# Patient Record
Sex: Male | Born: 1950 | Race: Black or African American | Hispanic: Refuse to answer | Marital: Single | Smoking: Former smoker
Health system: Southern US, Community
[De-identification: ages and names within clinical notes are randomized; demographics above are authoritative.]

## PROBLEM LIST (undated history)

## (undated) DIAGNOSIS — G8929 Other chronic pain: Secondary | ICD-10-CM

## (undated) DIAGNOSIS — E669 Obesity, unspecified: Secondary | ICD-10-CM

## (undated) DIAGNOSIS — I5042 Chronic combined systolic (congestive) and diastolic (congestive) heart failure: Secondary | ICD-10-CM

## (undated) DIAGNOSIS — J449 Chronic obstructive pulmonary disease, unspecified: Secondary | ICD-10-CM

## (undated) DIAGNOSIS — I429 Cardiomyopathy, unspecified: Secondary | ICD-10-CM

## (undated) DIAGNOSIS — G4730 Sleep apnea, unspecified: Secondary | ICD-10-CM

## (undated) DIAGNOSIS — K219 Gastro-esophageal reflux disease without esophagitis: Secondary | ICD-10-CM

## (undated) DIAGNOSIS — M1990 Unspecified osteoarthritis, unspecified site: Secondary | ICD-10-CM

## (undated) DIAGNOSIS — I10 Essential (primary) hypertension: Secondary | ICD-10-CM

## (undated) DIAGNOSIS — M109 Gout, unspecified: Secondary | ICD-10-CM

## (undated) DIAGNOSIS — M549 Dorsalgia, unspecified: Secondary | ICD-10-CM

## (undated) HISTORY — DX: Cardiomyopathy, unspecified: I42.9

## (undated) HISTORY — DX: Obesity, unspecified: E66.9

## (undated) HISTORY — DX: Chronic combined systolic (congestive) and diastolic (congestive) heart failure: I50.42

## (undated) HISTORY — PX: TONSILLECTOMY: SUR1361

## (undated) HISTORY — DX: Gout, unspecified: M10.9

## (undated) HISTORY — PX: EPIDURAL BLOCK INJECTION: SHX1516

---

## 2016-08-31 ENCOUNTER — Other Ambulatory Visit: Payer: Self-pay | Admitting: Internal Medicine

## 2016-08-31 ENCOUNTER — Other Ambulatory Visit: Payer: Self-pay

## 2016-08-31 ENCOUNTER — Ambulatory Visit (INDEPENDENT_AMBULATORY_CARE_PROVIDER_SITE_OTHER): Payer: Medicaid Other

## 2016-08-31 DIAGNOSIS — I519 Heart disease, unspecified: Secondary | ICD-10-CM | POA: Diagnosis not present

## 2016-09-01 NOTE — Progress Notes (Unsigned)
Ronald Horne

## 2017-01-30 ENCOUNTER — Telehealth: Payer: Self-pay | Admitting: *Deleted

## 2017-06-23 ENCOUNTER — Emergency Department
Admission: EM | Admit: 2017-06-23 | Discharge: 2017-06-23 | Disposition: A | Discharge status: Home / Self Care | Payer: Medicaid Other | Attending: Emergency Medicine | Admitting: Emergency Medicine

## 2017-06-23 ENCOUNTER — Encounter: Payer: Self-pay | Admitting: Emergency Medicine

## 2017-06-23 DIAGNOSIS — H6122 Impacted cerumen, left ear: Secondary | ICD-10-CM | POA: Insufficient documentation

## 2017-06-23 DIAGNOSIS — Z79891 Long term (current) use of opiate analgesic: Secondary | ICD-10-CM | POA: Insufficient documentation

## 2017-06-23 DIAGNOSIS — Z79899 Other long term (current) drug therapy: Secondary | ICD-10-CM | POA: Diagnosis not present

## 2017-06-23 DIAGNOSIS — H9202 Otalgia, left ear: Secondary | ICD-10-CM | POA: Diagnosis present

## 2017-06-23 DIAGNOSIS — G8929 Other chronic pain: Secondary | ICD-10-CM | POA: Diagnosis not present

## 2017-06-23 DIAGNOSIS — I10 Essential (primary) hypertension: Secondary | ICD-10-CM | POA: Diagnosis not present

## 2017-06-23 HISTORY — DX: Dorsalgia, unspecified: M54.9

## 2017-06-23 HISTORY — DX: Gastro-esophageal reflux disease without esophagitis: K21.9

## 2017-06-23 HISTORY — DX: Unspecified osteoarthritis, unspecified site: M19.90

## 2017-06-23 HISTORY — DX: Essential (primary) hypertension: I10

## 2017-06-23 HISTORY — DX: Other chronic pain: G89.29

## 2017-06-23 MED ORDER — CARBAMIDE PEROXIDE 6.5 % OT SOLN: 5.0000 [drp] | Freq: Two times a day (BID) | OTIC | 0 refills | Status: DC

## 2017-06-23 NOTE — ED Notes (Signed)
See triage note  States he thinks he had gotten some water in left ear about 2 days ago.. States he had decreased hearing in left ear this afternoon  States hearing has improved slightly

## 2017-06-23 NOTE — ED Provider Notes (Signed)
[facility] Emergency Department Provider Note   ____________________________________________   First MD Initiated Contact with Patient 06/23/17 1759     (approximate)  I have reviewed the triage vital signs and the nursing notes.   HISTORY  Chief Complaint Otalgia and Hearing Loss    HPI Ronald Horne is a 67 y.o. male patient complaining of decreased hearing left ear for 2 days. Patient states status post taken a shower. Patient has a history of cerumen impaction.No palliative measures for complaint. Patient rates his pain discomfort as a 4/10.   Past Medical History:  Diagnosis Date  . Arthritis   . Chronic back pain   . GERD (gastroesophageal reflux disease)   . Hypertension     There are no active problems to display for this patient.   History reviewed. No pertinent surgical history.  Prior to Admission medications   Medication Sig Start Date End Date Taking? Authorizing Provider  atorvastatin (LIPITOR) 10 MG tablet Take 10 mg by mouth daily.   Yes [provider]  lisinopril (PRINIVIL,ZESTRIL) 40 MG tablet Take 40 mg by mouth daily.   Yes [provider]  methimazole (TAPAZOLE) 10 MG tablet Take 10 mg by mouth 3 (three) times daily.   Yes [provider]  metoprolol tartrate (LOPRESSOR) 25 MG tablet Take 25 mg by mouth 2 (two) times daily.   Yes [provider]  mirtazapine (REMERON) 30 MG tablet Take 30 mg by mouth at bedtime.   Yes [provider]  morphine (MS CONTIN) 60 MG 12 hr tablet Take 60 mg by mouth every 12 (twelve) hours.   Yes [provider]  carbamide peroxide (DEBROX) 6.5 % OTIC solution Place 5 drops into the right ear 2 (two) times daily. 06/23/17   Joni Reining, PA-C    Allergies Patient has no known allergies.  No family history on file.  Social History Social History  Substance Use Topics  . Smoking status: Never Smoker  . Smokeless tobacco: Never Used   . Alcohol use No    Review of Systems  Constitutional: No fever/chills Eyes: No visual changes. ENT: No sore throat. Decreased hearing left ear Cardiovascular: Denies chest pain. Respiratory: Denies shortness of breath. Gastrointestinal: No abdominal pain.  No nausea, no vomiting.  No diarrhea.  No constipation. Genitourinary: Negative for dysuria. Musculoskeletal: Chronic back pain Skin: Negative for rash. Neurological: Negative for headaches, focal weakness or numbness. Endocrine:Hypertension   ____________________________________________   PHYSICAL EXAM:  VITAL SIGNS: ED Triage Vitals  Enc Vitals Group     BP 06/23/17 1740 (!) 157/98     Pulse Rate 06/23/17 1740 98     Resp 06/23/17 1740 18     Temp 06/23/17 1740 99.1 F (37.3 C)     Temp Source 06/23/17 1740 Oral     SpO2 06/23/17 1740 96 %     Weight 06/23/17 1736 (!) 310 lb (140.6 kg)     Height 06/23/17 1736 6\' 1"  (1.854 m)     Head Circumference --      Peak Flow --      Pain Score 06/23/17 1736 4     Pain Loc --      Pain Edu? --      Excl. in GC? --     Constitutional: Alert and oriented. Well appearing and in no acute distress. EAR: Left TM not visible secondary to impaction Cardiovascular: Normal rate, regular rhythm. Grossly normal heart sounds.  Good peripheral circulation. Respiratory:  Normal respiratory effort.  No retractions. Lungs CTAB. Neurologic:  Normal speech and language. No gross focal neurologic deficits are appreciated. No gait instability. Skin:  Skin is warm, dry and intact. No rash noted. Psychiatric: Mood and affect are normal. Speech and behavior are normal.  ____________________________________________   LABS (all labs ordered are listed, but only abnormal results are displayed)  Labs Reviewed - No data to display ____________________________________________  EKG   ____________________________________________  RADIOLOGY  No results  found.  ____________________________________________   PROCEDURES  Procedure(s) performed: None  Procedures  Critical Care performed: No  ____________________________________________   INITIAL IMPRESSION / ASSESSMENT AND PLAN / ED COURSE  Pertinent labs & imaging results that were available during my care of the patient were reviewed by me and considered in my medical decision making (see chart for details).  Patient was sent for 2-3 days a hearing loss secondary to cerumen impaction. Nurses attempted to irrigate ear but noticed bloody return. Patient informed errors that he use a bobby pin 2 days ago to try removal earwax and has some mild bleeding. Advised patient we will stop irrigation and use it wax softener for 2-3 days. Advised to follow-up with PCP or return to ED in 3 days.      ____________________________________________   FINAL CLINICAL IMPRESSION(S) / ED DIAGNOSES  Final diagnoses:  Impacted cerumen of left ear      NEW MEDICATIONS STARTED DURING THIS VISIT:  New Prescriptions   CARBAMIDE PEROXIDE (DEBROX) 6.5 % OTIC SOLUTION    Place 5 drops into the right ear 2 (two) times daily.     Note:  This document was prepared using Dragon voice recognition software and may include unintentional dictation errors.    Joni Reining, PA-C 06/23/17 1833    Emily Filbert, MD 06/23/17 [phone]

## 2017-06-23 NOTE — ED Notes (Signed)
Left ear irrigated with warm water  Small amt of ear wax removed  With some blood noted in ear canal  States he stuck a bobby pin in same ear PTA

## 2017-06-23 NOTE — ED Triage Notes (Signed)
Patient presents to the ED with left ear pain and difficulty hearing out of his left ear x 2 days.  Patient is in no obvious distress at this time.

## 2017-06-26 ENCOUNTER — Emergency Department
Admission: EM | Admit: 2017-06-26 | Discharge: 2017-06-26 | Disposition: A | Discharge status: Home / Self Care | Payer: Medicaid Other | Attending: Emergency Medicine | Admitting: Emergency Medicine

## 2017-06-26 DIAGNOSIS — Z79899 Other long term (current) drug therapy: Secondary | ICD-10-CM | POA: Insufficient documentation

## 2017-06-26 DIAGNOSIS — H6122 Impacted cerumen, left ear: Secondary | ICD-10-CM

## 2017-06-26 DIAGNOSIS — H9222 Otorrhagia, left ear: Secondary | ICD-10-CM | POA: Diagnosis not present

## 2017-06-26 DIAGNOSIS — I10 Essential (primary) hypertension: Secondary | ICD-10-CM | POA: Diagnosis not present

## 2017-06-26 MED ORDER — CIPROFLOXACIN-DEXAMETHASONE 0.3-0.1 % OT SUSP: 4.0000 [drp] | Freq: Two times a day (BID) | OTIC | 0 refills | Status: AC

## 2017-06-26 NOTE — ED Notes (Signed)
States he wants to speak with Provider again and requesting antibiotics for ear   Provider in with pt

## 2017-06-26 NOTE — ED Notes (Signed)
See triage note  Was seen on Friday for same  Was told to return on Monday for irrigation

## 2017-06-26 NOTE — ED Notes (Signed)
Left ear was irrigated with warm water/h2o2  Removed 4 small pieces wax then noticed blood in ear canal  Provider made aware

## 2017-06-26 NOTE — ED Triage Notes (Signed)
Pt states he was here the other night and had impaction of the left ear, states they started to clean the ear out but stopped and he is here to finish it.

## 2017-06-26 NOTE — Discharge Instructions (Signed)
Follow-up with ENT regarding continued care of cerumen in the left ear.

## 2017-06-26 NOTE — ED Provider Notes (Signed)
[facility] Emergency Department Provider Note   ____________________________________________   I have reviewed the triage vital signs and the nursing notes.   HISTORY  Chief Complaint Cerumen Impaction    HPI Ronald Horne is a 66 y.o. male presents to the emergency department with left ear cerumen buildup. Patient was seen on Friday for the same. Patient was informed to return to have remaining cerumen removed. Patient reports he was unable to hear at all prior to Fridays treatment, following that treatment he has been able to hear. Patient denies ear pain, swelling or other ear symptoms. Patient reports using the debrox drops as prescribed. Patient denies fever, chills, headache, vision changes, chest pain, chest tightness, shortness of breath, abdominal pain, nausea and vomiting.  Past Medical History:  Diagnosis Date  . Arthritis   . Chronic back pain   . GERD (gastroesophageal reflux disease)   . Hypertension     There are no active problems to display for this patient.   History reviewed. No pertinent surgical history.  Prior to Admission medications   Medication Sig Start Date End Date Taking? Authorizing Provider  atorvastatin (LIPITOR) 10 MG tablet Take 10 mg by mouth daily.    [provider]  carbamide peroxide (DEBROX) 6.5 % OTIC solution Place 5 drops into the right ear 2 (two) times daily. 06/23/17   Joni Reining, PA-C  ciprofloxacin-dexamethasone (CIPRODEX) OTIC suspension Place 4 drops into the left ear 2 (two) times daily. 06/26/17 07/01/17  Azaria Stegman M, PA-C  lisinopril (PRINIVIL,ZESTRIL) 40 MG tablet Take 40 mg by mouth daily.    [provider]  methimazole (TAPAZOLE) 10 MG tablet Take 10 mg by mouth 3 (three) times daily.    [provider]  metoprolol tartrate (LOPRESSOR) 25 MG tablet Take 25 mg by mouth 2 (two) times daily.    [provider]  mirtazapine (REMERON) 30 MG tablet Take 30 mg  by mouth at bedtime.    [provider]  morphine (MS CONTIN) 60 MG 12 hr tablet Take 60 mg by mouth every 12 (twelve) hours.    [provider]    Allergies Patient has no known allergies.  No family history on file.  Social History Social History  Substance Use Topics  . Smoking status: Never Smoker  . Smokeless tobacco: Never Used  . Alcohol use No    Review of Systems Constitutional: Negative for fever/chills Eyes: No visual changes. ENT:  Negative for sore throat and for difficulty swallowing. Left ear cerumen build up.  Cardiovascular: Denies chest pain. Respiratory: Denies cough. Denies shortness of breath. Gastrointestinal: No abdominal pain.  No nausea, vomiting, diarrhea. Genitourinary: Negative for dysuria. Musculoskeletal: Negative for back pain. Skin: Negative for rash. Neurological: Negative for headaches.  Negative focal weakness or numbness. Negative for loss of consciousness. Able to ambulate. ____________________________________________   PHYSICAL EXAM:  VITAL SIGNS: ED Triage Vitals  Enc Vitals Group     BP 06/26/17 0816 130/83     Pulse Rate 06/26/17 0816 85     Resp 06/26/17 0816 20     Temp 06/26/17 0816 98 F (36.7 C)     Temp Source 06/26/17 0816 Oral     SpO2 06/26/17 0816 97 %     Weight 06/26/17 0808 (!) 310 lb (140.6 kg)     Height 06/26/17 0808 6\' 1"  (1.854 m)     Head Circumference --      Peak Flow --  Pain Score 06/26/17 0808 0     Pain Loc --      Pain Edu? --      Excl. in GC? --     Constitutional: Alert and oriented. Well appearing and in no acute distress.  Eyes: Conjunctivae are normal. PERRL. EOMI  Head: Normocephalic and atraumatic. ENT:      Ears: Right canal clear. Left canal occlude with cerumen.  TM visible on right, intact. TM on left not visible secondary to cerumen build up.       Nose: No congestion/rhinnorhea.      Mouth/Throat: Mucous membranes are moist.  Neck:Supple. No thyromegaly.  No stridor.  Cardiovascular: Normal rate, regular rhythm. Normal S1 and S2.  Good peripheral circulation. Respiratory: Normal respiratory effort without tachypnea or retractions. Lungs CTAB. No wheezes/rales/rhonchi. Good air entry to the bases with no decreased or absent breath sounds. Hematological/Lymphatic/Immunological: No cervical lymphadenopathy. Cardiovascular: Normal rate, regular rhythm. Normal distal pulses. Gastrointestinal: Bowel sounds 4 quadrants. Soft and nontender to palpation. No guarding or rigidity. No palpable masses. No distention. No CVA tenderness. Musculoskeletal: Nontender with normal range of motion in all extremities. Neurologic: Normal speech and language.  Skin:  Skin is warm, dry and intact. No rash noted. Psychiatric: Mood and affect are normal. Speech and behavior are normal. Patient exhibits appropriate insight and judgement.  ____________________________________________   LABS (all labs ordered are listed, but only abnormal results are displayed)  Labs Reviewed - No data to display ____________________________________________  EKG none ____________________________________________  RADIOLOGY none ____________________________________________   PROCEDURES  Procedure(s) performed: Ceruminosis is noted.  Buildup of cerumen removal attempted however blood was present. No further removal attempted.   Critical Care performed: no ____________________________________________   INITIAL IMPRESSION / ASSESSMENT AND PLAN / ED COURSE  Pertinent labs & imaging results that were available during my care of the patient were reviewed by me and considered in my medical decision making (see chart for details).   Patient presents to emergency department with left ear cerumen buildup. Cerumen removal attempted however, blood was noted in the ear canal and removal attempt ceased. Discussed with patient further cervical removal would need to be attempted by ENT.  Patient requested antibiotic coverage. Patient prescribed Ciprodex drops. Patient advised to follow up with ENT as soon as possible or return to the emergency department if symptoms return or worsen. Patient informed of clinical course, understand medical decision-making process, and agree with plan.  ____________________________________________   FINAL CLINICAL IMPRESSION(S) / ED DIAGNOSES  Final diagnoses:  Impacted cerumen of left ear  Blood in left ear canal       NEW MEDICATIONS STARTED DURING THIS VISIT:  Discharge Medication List as of 06/26/2017 10:04 AM       Note:  This document was prepared using Dragon voice recognition software and may include unintentional dictation errors.    Clois Comber, PA-C 06/26/17 1546    Jene Every, MD 06/27/17 [phone]

## 2018-04-12 ENCOUNTER — Encounter

## 2018-04-12 ENCOUNTER — Encounter: Payer: Self-pay | Admitting: Cardiovascular Disease

## 2018-04-12 ENCOUNTER — Ambulatory Visit (INDEPENDENT_AMBULATORY_CARE_PROVIDER_SITE_OTHER): Payer: Medicare Other | Admitting: Cardiovascular Disease

## 2018-04-12 ENCOUNTER — Other Ambulatory Visit: Payer: Self-pay | Admitting: Cardiovascular Disease

## 2018-04-12 VITALS — BP 120/88 | HR 86 | Ht 73.5 in | Wt 303.5 lb

## 2018-04-12 DIAGNOSIS — R0600 Dyspnea, unspecified: Secondary | ICD-10-CM

## 2018-04-12 DIAGNOSIS — I10 Essential (primary) hypertension: Secondary | ICD-10-CM | POA: Diagnosis not present

## 2018-04-12 DIAGNOSIS — R0609 Other forms of dyspnea: Principal | ICD-10-CM

## 2018-04-12 DIAGNOSIS — I5023 Acute on chronic systolic (congestive) heart failure: Secondary | ICD-10-CM | POA: Diagnosis not present

## 2018-04-12 DIAGNOSIS — Z01810 Encounter for preprocedural cardiovascular examination: Secondary | ICD-10-CM | POA: Diagnosis not present

## 2018-04-12 DIAGNOSIS — E785 Hyperlipidemia, unspecified: Secondary | ICD-10-CM

## 2018-04-12 MED ORDER — FUROSEMIDE 40 MG PO TABS: 40.0000 mg | ORAL_TABLET | Freq: Every day | ORAL | 3 refills | Status: DC

## 2018-04-12 MED ORDER — POTASSIUM CHLORIDE CRYS ER 20 MEQ PO TBCR: 20.0000 meq | EXTENDED_RELEASE_TABLET | Freq: Every day | ORAL | 3 refills | Status: DC

## 2018-04-12 NOTE — Progress Notes (Signed)
Cardiology Office Note   Date:  04/12/2018   ID:  Ronald Horne, DOB Oct 10, 1951, MRN [id number]  PCP:  Sherrie Mustache, MD  Cardiologist:   Lorine Bears, MD   Chief Complaint  Patient presents with  . other    Ref by Dr. Dario Guardian for shortness of breath, LE edema, chest tightness/neck pain and abdominal pain. Meds reviewed by the pt. verbally.       History of Present Illness: Ronald Horne is a 67 y.o. male who was referred by Dr. Solmon Ice for evaluation of congestive heart failure.  The patient has prolonged history of essential hypertension, hyperlipidemia and obesity.  He has symptoms suggestive of sleep apnea but this has not been evaluated. He had a previous echocardiogram in November 2017 which showed an EF of 30 to 35% with diffuse hypokinesis, mild to moderate mitral regurgitation and mild biatrial enlargement.  Over the last 3 weeks, he developed progressive exertional dyspnea which is happening now after walking few steps.  He also noted significant orthopnea over the last week and he has not been able to sleep over the last 2 nights because of that.  He had about 10 pound weight gain.  This has been associated with substernal chest tightness with activities.  He underwent a nuclear stress test with Dr. Dario Guardian which showed large anterior wall defect with an EF of 24%.  He was able to exercise for only 2 minutes.  The patient is not a smoker and he has no family history of coronary artery disease.  He is retired and used to do try cleaning.  He denies any alcohol use.    Past Medical History:  Diagnosis Date  . Arthritis   . Chronic back pain   . GERD (gastroesophageal reflux disease)   . Gout   . Hypertension     Past Surgical History:  Procedure Laterality Date  . EPIDURAL BLOCK INJECTION     chronic back pain   . TONSILLECTOMY       Current Outpatient Medications  Medication Sig Dispense Refill  . ARIPiprazole (ABILIFY) 5 MG tablet Take 5 mg by mouth  daily.    Marland Kitchen aspirin 81 MG tablet Take 81 mg by mouth daily.    Marland Kitchen atorvastatin (LIPITOR) 10 MG tablet Take 10 mg by mouth daily.    Marland Kitchen lisinopril (PRINIVIL,ZESTRIL) 40 MG tablet Take 40 mg by mouth daily.    . methimazole (TAPAZOLE) 5 MG tablet Take 5 mg by mouth daily.    . metoprolol tartrate (LOPRESSOR) 25 MG tablet Take 25 mg by mouth 2 (two) times daily.    . mirtazapine (REMERON) 30 MG tablet Take 30 mg by mouth at bedtime.    . naproxen (NAPROSYN) 500 MG tablet Take 500 mg by mouth every 8 (eight) hours as needed.    . furosemide (LASIX) 40 MG tablet Take 1 tablet (40 mg total) by mouth daily. 90 tablet 3  . potassium chloride SA (K-DUR,KLOR-CON) 20 MEQ tablet Take 1 tablet (20 mEq total) by mouth daily. 90 tablet 3   No current facility-administered medications for this visit.     Allergies:   Patient has no known allergies.    Social History:  The patient  reports that he quit smoking about 41 years ago. His smoking use included cigarettes. He has a 3.00 pack-year smoking history. He has never used smokeless tobacco. He reports that he does not drink alcohol or use drugs.   Family History:  The patient's family  history is negative for coronary artery disease or congestive heart failure.    ROS:  Please see the history of present illness.   Otherwise, review of systems are positive for none.   All other systems are reviewed and negative.    PHYSICAL EXAM: VS:  BP 120/88 (BP Location: Left Arm, Patient Position: Sitting, Cuff Size: Large)   Pulse 86   Ht 6' 1.5" (1.867 m)   Wt (!) 303 lb 8 oz (137.7 kg)   SpO2 100%   BMI 39.50 kg/m  , BMI Body mass index is 39.5 kg/m. GEN: Well nourished, well developed, in no acute distress  HEENT: normal  Neck: Mild JVD, carotid bruits, or masses Cardiac: RRR; no murmurs, rubs, or gallops,no edema  Respiratory:  clear to auscultation bilaterally, normal work of breathing GI: soft, nontender, nondistended, + BS MS: no deformity or  atrophy  Skin: warm and dry, no rash Neuro:  Strength and sensation are intact Psych: euthymic mood, full affect   EKG:  EKG is ordered today. The ekg ordered today demonstrates normal sinus rhythm with LVH and repolarization abnormalities.   Recent Labs: No results found for requested labs within last 8760 hours.    Lipid Panel No results found for: CHOL, TRIG, HDL, CHOLHDL, VLDL, LDLCALC, LDLDIRECT    Wt Readings from Last 3 Encounters:  04/12/18 (!) 303 lb 8 oz (137.7 kg)  06/26/17 (!) 306 lb (138.8 kg)  06/23/17 (!) 310 lb (140.6 kg)       PAD Screen 04/12/2018  Previous PAD dx? No  Previous surgical procedure? No  Pain with walking? No  Feet/toe relief with dangling? No  Painful, non-healing ulcers? No  Extremities discolored? No      ASSESSMENT AND PLAN:  1.  Acute on chronic systolic heart failure with severely reduced LV systolic function: Most recent ejection fraction was 24% by nuclear stress testing.  He is having symptoms at rest suggestive of [place] Heart Association class IV. The etiology for his cardiomyopathy could be hypertensive heart disease but we have to exclude underlying ischemic cardiomyopathy. Continue treatment with lisinopril and metoprolol for now but will likely switch him to carvedilol and Entresto in the near future as well as the addition of spironolactone. The patient is clearly volume overloaded and thus I started furosemide 40 mg once daily with potassium.  I recommend proceeding with a right and left cardiac catheterization for further evaluation of his heart failure and cardiomyopathy.  I discussed the procedure in detail as risks and benefits.  We will obtain an echocardiogram before then.  2.  Essential hypertension: Blood pressures controlled on current medications but will have to make changes as outlined above.  3.  Hyperlipidemia: Currently on atorvastatin.  4.  Morbid obesity: I suspect underlying obstructive sleep apnea  which should be evaluated as well.   Disposition:   FU with me in 1 month  Signed,  Lorine Bears, MD  04/12/2018 11:49 AM    [facility]

## 2018-04-12 NOTE — Patient Instructions (Addendum)
Medication Instructions:  Your physician has recommended you make the following change in your medication:  1- START Furosemide 40 mg (1 tablet) by mouth once a day. 2- START Potassium 20 mEq (1 tablet) by mouth once a day.   Labwork: Your physician recommends that you return for lab work in: on 04/16/18 in the office when you get your echo. (CBC, BMET).   Testing/Procedures: Your physician has requested that you have an echocardiogram. Echocardiography is a painless test that uses sound waves to create images of your heart. It provides your doctor with information about the size and shape of your heart and how well your heart's chambers and valves are working. This procedure takes approximately one hour. There are no restrictions for this procedure.  Your physician has requested that you have a cardiac catheterization. Cardiac catheterization is used to diagnose and/or treat various heart conditions. Doctors may recommend this procedure for a number of different reasons. The most common reason is to evaluate chest pain. Chest pain can be a symptom of coronary artery disease (CAD), and cardiac catheterization can show whether plaque is narrowing or blocking your heart's arteries. This procedure is also used to evaluate the valves, as well as measure the blood flow and oxygen levels in different parts of your heart. For further information please visit [email or web address] Please follow instruction sheet, as given.  [facility] Cardiac Cath Instructions   You are scheduled for a Cardiac Cath on:____06/27/19________  Please arrive at _06:30_am on the day of your procedure  Please expect a call from our [facility] to pre-register you  Do not eat/drink anything after midnight  Someone will need to drive you home  It is recommended someone be with you for the first 24 hours after your procedure  Wear clothes that are easy to get on/off and wear slip on shoes if  possible   Medications bring a current list of all medications with you  _XX_ You may take all of your medications the morning of your procedure with enough water to swallow safely  ___ Do not take these medications before your procedure:___FUROSEMIDE_____   Day of your procedure: Arrive at the [facility] entrance.  Free valet service is available.  After entering the [facility] please check-in at the registration desk (1st desk on your right) to receive your armband. After receiving your armband someone will escort you to the cardiac cath/special procedures waiting area.  The usual length of stay after your procedure is about 2 to 3 hours.  This can vary.  If you have any questions, please call our office at [phone], or you may call the cardiac cath lab at [facility] directly at [phone]   Follow-Up: Your physician recommends that you schedule a follow-up appointment in: 1-2 WEEKS AFTER CATH.  If you need a refill on your cardiac medications before your next appointment, please call your pharmacy.     Coronary Angiogram With Stent Coronary angiogram with stent placement is a procedure to widen or open a narrow blood vessel of the heart (coronary artery). Arteries may become blocked by cholesterol buildup (plaques) in the lining of the wall. When a coronary artery becomes partially blocked, blood flow to that area decreases. This may lead to chest pain or a heart attack (myocardial infarction). A stent is a small piece of metal that looks like mesh or a spring. Stent placement may be done as treatment for a heart attack or right after a coronary angiogram in which a blocked  artery is found. Let your health care provider know about:  Any allergies you have.  All medicines you are taking, including vitamins, herbs, eye drops, creams, and over-the-counter medicines.  Any problems you or family members have had with anesthetic medicines.  Any blood disorders you have.  Any  surgeries you have had.  Any medical conditions you have.  Whether you are pregnant or may be pregnant. What are the risks? Generally, this is a safe procedure. However, problems may occur, including:  Damage to the heart or its blood vessels.  A return of blockage.  Bleeding, infection, or bruising at the insertion site.  A collection of blood under the skin (hematoma) at the insertion site.  A blood clot in another part of the body.  Kidney injury.  Allergic reaction to the dye or contrast that is used.  Bleeding into the abdomen (retroperitoneal bleeding).  What happens before the procedure? Staying hydrated Follow instructions from your health care provider about hydration, which may include:  Up to 2 hours before the procedure - you may continue to drink clear liquids, such as water, clear fruit juice, black coffee, and plain tea.  Eating and drinking restrictions Follow instructions from your health care provider about eating and drinking, which may include:  8 hours before the procedure - stop eating heavy meals or foods such as meat, fried foods, or fatty foods.  6 hours before the procedure - stop eating light meals or foods, such as toast or cereal.  2 hours before the procedure - stop drinking clear liquids.  Ask your health care provider about:  Changing or stopping your regular medicines. This is especially important if you are taking diabetes medicines or blood thinners.  Taking medicines such as ibuprofen. These medicines can thin your blood. Do not take these medicines before your procedure if your health care provider instructs you not to. Generally, aspirin is recommended before a procedure of passing a small, thin tube (catheter) through a blood vessel and into the heart (cardiac catheterization).  What happens during the procedure?  An IV tube will be inserted into one of your veins.  You will be given one or more of the following: ? A medicine to  help you relax (sedative). ? A medicine to numb the area where the catheter will be inserted into an artery (local anesthetic).  To reduce your risk of infection: ? Your health care team will wash or sanitize their hands. ? Your skin will be washed with soap. ? Hair may be removed from the area where the catheter will be inserted.  Using a guide wire, the catheter will be inserted into an artery. The location may be in your groin, in your wrist, or in the fold of your arm (near your elbow).  A type of X-ray (fluoroscopy) will be used to help guide the catheter to the opening of the arteries in the heart.  A dye will be injected into the catheter, and X-rays will be taken. The dye will help to show where any narrowing or blockages are located in the arteries.  A tiny wire will be guided to the blocked spot, and a balloon will be inflated to make the artery wider.  The stent will be expanded and will crush the plaques into the wall of the vessel. The stent will hold the area open and improve the blood flow. Most stents have a drug coating to reduce the risk of the stent narrowing over time.  The artery  may be made wider using a drill, laser, or other tools to remove plaques.  When the blood flow is better, the catheter will be removed. The lining of the artery will grow over the stent, which stays where it was placed. This procedure may vary among health care providers and hospitals. What happens after the procedure?  If the procedure is done through the leg, you will be kept in bed lying flat for about 6 hours. You will be instructed to not bend and not cross your legs.  The insertion site will be checked frequently.  The pulse in your foot or wrist will be checked frequently.  You may have additional blood tests, X-rays, and a test that records the electrical activity of your heart (electrocardiogram, or ECG). This information is not intended to replace advice given to you by your  health care provider. Make sure you discuss any questions you have with your health care provider. Document Released: 04/23/2003 Document Revised: 06/16/2016 Document Reviewed: 05/22/2016 Elsevier Interactive Patient Education  [employer].

## 2018-04-16 ENCOUNTER — Other Ambulatory Visit: Payer: Self-pay

## 2018-04-16 ENCOUNTER — Other Ambulatory Visit (INDEPENDENT_AMBULATORY_CARE_PROVIDER_SITE_OTHER): Payer: Medicare Other

## 2018-04-16 ENCOUNTER — Ambulatory Visit (INDEPENDENT_AMBULATORY_CARE_PROVIDER_SITE_OTHER): Payer: Medicare Other

## 2018-04-16 DIAGNOSIS — I5023 Acute on chronic systolic (congestive) heart failure: Secondary | ICD-10-CM | POA: Diagnosis not present

## 2018-04-16 DIAGNOSIS — R0600 Dyspnea, unspecified: Secondary | ICD-10-CM

## 2018-04-16 DIAGNOSIS — Z01810 Encounter for preprocedural cardiovascular examination: Secondary | ICD-10-CM

## 2018-04-16 DIAGNOSIS — R0609 Other forms of dyspnea: Secondary | ICD-10-CM | POA: Diagnosis not present

## 2018-04-16 MED ORDER — PERFLUTREN LIPID MICROSPHERE
1.0000 mL | INTRAVENOUS | Status: AC | PRN
  Administered 2018-04-16: 2 mL via INTRAVENOUS

## 2018-04-17 LAB — CBC WITH DIFFERENTIAL/PLATELET
BASOS ABS: 0 10*3/uL (ref 0.0–0.2)
Basos: 0 %
EOS (ABSOLUTE): 0.1 10*3/uL (ref 0.0–0.4)
Eos: 2 %
Hematocrit: 43.9 % (ref 37.5–51.0)
Hemoglobin: 14.9 g/dL (ref 13.0–17.7)
Immature Grans (Abs): 0 10*3/uL (ref 0.0–0.1)
Immature Granulocytes: 0 %
LYMPHS ABS: 2.5 10*3/uL (ref 0.7–3.1)
LYMPHS: 38 %
MCH: 29.1 pg (ref 26.6–33.0)
MCHC: 33.9 g/dL (ref 31.5–35.7)
MCV: 86 fL (ref 79–97)
Monocytes Absolute: 0.4 10*3/uL (ref 0.1–0.9)
Monocytes: 6 %
NEUTROS ABS: 3.6 10*3/uL (ref 1.4–7.0)
Neutrophils: 54 %
PLATELETS: 257 10*3/uL (ref 150–450)
RBC: 5.12 x10E6/uL (ref 4.14–5.80)
RDW: 14.7 % (ref 12.3–15.4)
WBC: 6.6 10*3/uL (ref 3.4–10.8)

## 2018-04-17 LAB — BASIC METABOLIC PANEL
BUN/Creatinine Ratio: 15 (ref 10–24)
BUN: 19 mg/dL (ref 8–27)
CHLORIDE: 104 mmol/L (ref 96–106)
CO2: 25 mmol/L (ref 20–29)
Calcium: 9.3 mg/dL (ref 8.6–10.2)
Creatinine, Ser: 1.26 mg/dL (ref 0.76–1.27)
GFR calc Af Amer: 69 mL/min/{1.73_m2} (ref >59)
GFR calc non Af Amer: 59 mL/min/{1.73_m2} — ABNORMAL LOW (ref >59)
Glucose: 103 mg/dL — ABNORMAL HIGH (ref 65–99)
Potassium: 4.4 mmol/L (ref 3.5–5.2)
SODIUM: 141 mmol/L (ref 134–144)

## 2018-04-23 ENCOUNTER — Telehealth: Payer: Self-pay | Admitting: Cardiovascular Disease

## 2018-04-23 NOTE — Telephone Encounter (Signed)
New message   Patient calling for lab and echo results

## 2018-04-23 NOTE — Telephone Encounter (Signed)
Call placed to the patient. He stated that he will be there for his cardiac cath on Thursday. He has been instructed to call if he has any questions or concerns.

## 2018-04-23 NOTE — Telephone Encounter (Signed)
Notes recorded by Antonieta Iba, MD on 04/18/2018 at 5:00 PM EDT Echocardiogram result Prior history of cardiomyopathy Ejection fraction 20% Scheduled for cardiac catheterization next week with Dr. Kirke Corin Pre-catheterization lab work is stable, normal   Patient made aware.   He states he is unsure if he is going to be able to make the "appointment" on Thursday (cath with Dr. Kirke Corin).   He states he will let us know soon if he is unable to make this.   Encouraged patient to keep upcoming procedure scheduled as recommended.     Routed to primary nurse to make aware.

## 2018-04-25 NOTE — Telephone Encounter (Signed)
Patient says Dr Aurelio Brash office called yesterday and said his results said "no acute disease of the heart." Patient could not tell me what those results were for. I could not see anything on our end from Dr Kirke Corin where heart cath was not needed. Patient says "now I feel normal and I don't think I need this." Advised patient that according to Dr Jari Sportsman office visit note, he was referred for abnormal stress test from Dr Dario Guardian and it is recommended he have heart catheterization. Patient still very adament that it is not needed. I asked for number to call Dr Aurelio Brash directly.   Called and s/w Patsy at Dr Aurelio Brash office. She explained to me that the results were for a chest xray the patient had and she did not saying anything about his heart. She said "no acute disease" but not referring to the heart. She was happy to call the patient back to clarify this with him. I explained that would be great because patient needed that extra clarification. I will call patient back as well after she has had time to contact patient.

## 2018-04-25 NOTE — Telephone Encounter (Signed)
No answer. Left message to call back.   

## 2018-04-25 NOTE — Telephone Encounter (Signed)
Please call regarding cath tomorrow. Pt states Dr. Dario Guardian states he does not have any heart issues.

## 2018-04-25 NOTE — Telephone Encounter (Signed)
No answer. Left message to call back with patient.  Called Patsy at Dr Aurelio Brash office. She said she talked to patient. He told her the same thing that he felt fine and did not need the procedure. He told her he would call back if he had any more issues.  Routing to Dr Kirke Corin to make him aware that patient may not show for heart cath tomorrow.  Unable to contact patient. No answer.

## 2018-04-26 ENCOUNTER — Ambulatory Visit: Admission: RE | Admit: 2018-04-26 | Payer: Medicare Other | Source: Ambulatory Visit | Admitting: Cardiovascular Disease

## 2018-04-26 ENCOUNTER — Encounter: Admission: RE | Payer: Self-pay | Source: Ambulatory Visit

## 2018-04-26 SURGERY — RIGHT/LEFT HEART CATH AND CORONARY ANGIOGRAPHY
Anesthesia: Moderate Sedation

## 2018-04-26 NOTE — Telephone Encounter (Signed)
Patient is scheduled for an f/u on 05/16/18 at 0930 am with Ward Givens, NP.  No answer. Left message with patient recommending he keep his upcoming appointment on date and time and to call back if he has any further questions.

## 2018-05-16 ENCOUNTER — Encounter: Payer: Self-pay | Admitting: Nurse Practitioner

## 2018-05-16 ENCOUNTER — Ambulatory Visit: Payer: Medicaid Other | Admitting: Nurse Practitioner

## 2018-05-17 ENCOUNTER — Encounter: Payer: Self-pay | Admitting: Nurse Practitioner

## 2018-06-28 ENCOUNTER — Ambulatory Visit: Payer: Self-pay | Admitting: Cardiovascular Disease

## 2019-01-01 ENCOUNTER — Encounter: Payer: Self-pay | Admitting: *Deleted

## 2019-10-14 ENCOUNTER — Encounter: Payer: Self-pay | Admitting: Internal Medicine

## 2019-10-15 ENCOUNTER — Encounter: Payer: Self-pay | Admitting: *Deleted

## 2019-11-28 ENCOUNTER — Ambulatory Visit: Payer: Self-pay | Admitting: Cardiovascular Disease

## 2019-11-28 DIAGNOSIS — I249 Acute ischemic heart disease, unspecified: Secondary | ICD-10-CM | POA: Insufficient documentation

## 2019-11-28 MED ORDER — SODIUM CHLORIDE 0.9% FLUSH
3.0000 mL | Freq: Two times a day (BID) | INTRAVENOUS | Status: DC
  Filled 2019-11-28: qty 3

## 2019-12-17 ENCOUNTER — Other Ambulatory Visit: Payer: Self-pay

## 2019-12-17 ENCOUNTER — Other Ambulatory Visit
Admission: RE | Admit: 2019-12-17 | Discharge: 2019-12-17 | Disposition: A | Discharge status: Home / Self Care | Payer: Medicare Other | Source: Ambulatory Visit | Attending: Internal Medicine | Admitting: Internal Medicine

## 2019-12-17 DIAGNOSIS — Z20822 Contact with and (suspected) exposure to covid-19: Secondary | ICD-10-CM | POA: Insufficient documentation

## 2019-12-17 DIAGNOSIS — Z01812 Encounter for preprocedural laboratory examination: Secondary | ICD-10-CM | POA: Diagnosis present

## 2019-12-17 LAB — SARS CORONAVIRUS 2 (TAT 6-24 HRS): SARS Coronavirus 2: NEGATIVE

## 2019-12-18 ENCOUNTER — Other Ambulatory Visit: Payer: Medicare Other

## 2019-12-25 ENCOUNTER — Other Ambulatory Visit
Admission: RE | Admit: 2019-12-25 | Discharge: 2019-12-25 | Disposition: A | Discharge status: Home / Self Care | Payer: Medicare Other | Source: Ambulatory Visit | Attending: Cardiovascular Disease | Admitting: Cardiovascular Disease

## 2019-12-25 DIAGNOSIS — Z01812 Encounter for preprocedural laboratory examination: Secondary | ICD-10-CM | POA: Insufficient documentation

## 2019-12-25 DIAGNOSIS — Z20822 Contact with and (suspected) exposure to covid-19: Secondary | ICD-10-CM | POA: Diagnosis not present

## 2019-12-25 LAB — SARS CORONAVIRUS 2 (TAT 6-24 HRS): SARS Coronavirus 2: NEGATIVE

## 2019-12-27 ENCOUNTER — Ambulatory Visit
Admission: RE | Admit: 2019-12-27 | Discharge: 2019-12-27 | Disposition: A | Discharge status: Home / Self Care | Payer: Medicare Other | Attending: Cardiovascular Disease | Admitting: Cardiovascular Disease

## 2019-12-27 ENCOUNTER — Encounter
Admission: RE | Disposition: A | Discharge status: Home / Self Care | Payer: Self-pay | Source: Home / Self Care | Attending: Cardiovascular Disease

## 2019-12-27 ENCOUNTER — Encounter: Payer: Self-pay | Admitting: Cardiovascular Disease

## 2019-12-27 ENCOUNTER — Other Ambulatory Visit: Payer: Self-pay

## 2019-12-27 DIAGNOSIS — E785 Hyperlipidemia, unspecified: Secondary | ICD-10-CM | POA: Insufficient documentation

## 2019-12-27 DIAGNOSIS — I110 Hypertensive heart disease with heart failure: Secondary | ICD-10-CM | POA: Diagnosis not present

## 2019-12-27 DIAGNOSIS — Z87891 Personal history of nicotine dependence: Secondary | ICD-10-CM | POA: Insufficient documentation

## 2019-12-27 DIAGNOSIS — I501 Left ventricular failure: Secondary | ICD-10-CM | POA: Insufficient documentation

## 2019-12-27 DIAGNOSIS — I249 Acute ischemic heart disease, unspecified: Secondary | ICD-10-CM | POA: Diagnosis present

## 2019-12-27 DIAGNOSIS — Z79899 Other long term (current) drug therapy: Secondary | ICD-10-CM | POA: Insufficient documentation

## 2019-12-27 DIAGNOSIS — Z8249 Family history of ischemic heart disease and other diseases of the circulatory system: Secondary | ICD-10-CM | POA: Diagnosis not present

## 2019-12-27 DIAGNOSIS — R0602 Shortness of breath: Secondary | ICD-10-CM | POA: Diagnosis not present

## 2019-12-27 HISTORY — PX: LEFT HEART CATH AND CORONARY ANGIOGRAPHY: CATH118249

## 2019-12-27 LAB — CBC
HCT: 45.3 % (ref 39.0–52.0)
Hemoglobin: 15.1 g/dL (ref 13.0–17.0)
MCH: 29.7 pg (ref 26.0–34.0)
MCHC: 33.3 g/dL (ref 30.0–36.0)
MCV: 89 fL (ref 80.0–100.0)
Platelets: 221 10*3/uL (ref 150–400)
RBC: 5.09 MIL/uL (ref 4.22–5.81)
RDW: 13.6 % (ref 11.5–15.5)
WBC: 8.7 10*3/uL (ref 4.0–10.5)
nRBC: 0 % (ref 0.0–0.2)

## 2019-12-27 LAB — BASIC METABOLIC PANEL
Anion gap: 10 (ref 5–15)
BUN: 18 mg/dL (ref 8–23)
CO2: 26 mmol/L (ref 22–32)
Calcium: 8.7 mg/dL — ABNORMAL LOW (ref 8.9–10.3)
Chloride: 104 mmol/L (ref 98–111)
Creatinine, Ser: 1.26 mg/dL — ABNORMAL HIGH (ref 0.61–1.24)
GFR calc Af Amer: >60 mL/min (ref >60)
GFR calc non Af Amer: 59 mL/min — ABNORMAL LOW (ref >60)
Glucose, Bld: 141 mg/dL — ABNORMAL HIGH (ref 70–99)
Potassium: 3.7 mmol/L (ref 3.5–5.1)
Sodium: 140 mmol/L (ref 135–145)

## 2019-12-27 LAB — PROTIME-INR
INR: 1 (ref 0.8–1.2)
Prothrombin Time: 12.9 s (ref 11.4–15.2)

## 2019-12-27 SURGERY — LEFT HEART CATH AND CORONARY ANGIOGRAPHY
Anesthesia: Moderate Sedation | Laterality: Left

## 2019-12-27 MED ORDER — ASPIRIN 81 MG PO CHEW: 81.0000 mg | CHEWABLE_TABLET | ORAL | Status: DC

## 2019-12-27 MED ORDER — FENTANYL CITRATE (PF) 100 MCG/2ML IJ SOLN
INTRAMUSCULAR | Status: AC
  Filled 2019-12-27: qty 2

## 2019-12-27 MED ORDER — ONDANSETRON HCL 4 MG/2ML IJ SOLN: 4.0000 mg | Freq: Four times a day (QID) | INTRAMUSCULAR | Status: DC | PRN

## 2019-12-27 MED ORDER — HEPARIN (PORCINE) IN NACL 1000-0.9 UT/500ML-% IV SOLN
INTRAVENOUS | Status: AC
  Filled 2019-12-27: qty 1000

## 2019-12-27 MED ORDER — HEPARIN SODIUM (PORCINE) 1000 UNIT/ML IJ SOLN
INTRAMUSCULAR | Status: AC
  Filled 2019-12-27: qty 1

## 2019-12-27 MED ORDER — VERAPAMIL HCL 2.5 MG/ML IV SOLN
INTRAVENOUS | Status: AC
  Filled 2019-12-27: qty 2

## 2019-12-27 MED ORDER — IOHEXOL 300 MG/ML  SOLN
INTRAMUSCULAR | Status: DC | PRN
  Administered 2019-12-27: 100 mL

## 2019-12-27 MED ORDER — HEPARIN (PORCINE) IN NACL 1000-0.9 UT/500ML-% IV SOLN
INTRAVENOUS | Status: DC | PRN
  Administered 2019-12-27: 500 mL

## 2019-12-27 MED ORDER — FENTANYL CITRATE (PF) 100 MCG/2ML IJ SOLN
INTRAMUSCULAR | Status: DC | PRN
  Administered 2019-12-27: 50 ug via INTRAVENOUS

## 2019-12-27 MED ORDER — SODIUM CHLORIDE 0.9 % IV SOLN: INTRAVENOUS | Status: DC

## 2019-12-27 MED ORDER — SODIUM CHLORIDE 0.9% FLUSH: 3.0000 mL | INTRAVENOUS | Status: DC | PRN

## 2019-12-27 MED ORDER — HYDRALAZINE HCL 20 MG/ML IJ SOLN: 10.0000 mg | INTRAMUSCULAR | Status: DC | PRN

## 2019-12-27 MED ORDER — VERAPAMIL HCL 2.5 MG/ML IV SOLN
INTRAVENOUS | Status: DC | PRN
  Administered 2019-12-27: 2.5 mg via INTRAVENOUS

## 2019-12-27 MED ORDER — SODIUM CHLORIDE 0.9 % IV SOLN: 250.0000 mL | INTRAVENOUS | Status: DC | PRN

## 2019-12-27 MED ORDER — MIDAZOLAM HCL 2 MG/2ML IJ SOLN
INTRAMUSCULAR | Status: AC
  Filled 2019-12-27: qty 2

## 2019-12-27 MED ORDER — HEPARIN SODIUM (PORCINE) 1000 UNIT/ML IJ SOLN
INTRAMUSCULAR | Status: DC | PRN
  Administered 2019-12-27: 6000 [IU] via INTRAVENOUS

## 2019-12-27 MED ORDER — ACETAMINOPHEN 325 MG PO TABS: 650.0000 mg | ORAL_TABLET | ORAL | Status: DC | PRN

## 2019-12-27 MED ORDER — MIDAZOLAM HCL 2 MG/2ML IJ SOLN
INTRAMUSCULAR | Status: DC | PRN
  Administered 2019-12-27: 1 mg via INTRAVENOUS

## 2019-12-27 MED ORDER — SODIUM CHLORIDE 0.9 % WEIGHT BASED INFUSION: 1.0000 mL/kg/h | INTRAVENOUS | Status: DC

## 2019-12-27 MED ORDER — LABETALOL HCL 5 MG/ML IV SOLN: 10.0000 mg | INTRAVENOUS | Status: DC | PRN

## 2019-12-27 MED ORDER — SODIUM CHLORIDE 0.9% FLUSH: 3.0000 mL | Freq: Two times a day (BID) | INTRAVENOUS | Status: DC

## 2019-12-27 SURGICAL SUPPLY — 10 items
CATH INFINITI 5 FR JL3.5 (CATHETERS) ×2 IMPLANT
CATH INFINITI JR4 5F (CATHETERS) ×2 IMPLANT
DEVICE RAD COMP TR BAND LRG (VASCULAR PRODUCTS) ×2 IMPLANT
GLIDESHEATH SLEND SS 6F .021 (SHEATH) ×2 IMPLANT
KIT MANI 3VAL PERCEP (MISCELLANEOUS) ×2 IMPLANT
NEEDLE PERC 18GX7CM (NEEDLE) IMPLANT
PACK CARDIAC CATH (CUSTOM PROCEDURE TRAY) ×2 IMPLANT
SHEATH AVANTI 5FR X 11CM (SHEATH) IMPLANT
WIRE GUIDERIGHT .035X150 (WIRE) IMPLANT
WIRE ROSEN-J .035X260CM (WIRE) ×2 IMPLANT

## 2021-04-22 ENCOUNTER — Ambulatory Visit: Payer: Medicare Other

## 2021-04-27 ENCOUNTER — Ambulatory Visit: Payer: Medicare Other | Attending: Sports Medicine

## 2021-04-27 DIAGNOSIS — M6281 Muscle weakness (generalized): Secondary | ICD-10-CM

## 2021-04-27 DIAGNOSIS — M25512 Pain in left shoulder: Secondary | ICD-10-CM | POA: Insufficient documentation

## 2021-04-27 NOTE — Therapy (Signed)
[place] [facility] [address], [place], [address] Phone: [phone]   Fax:  [phone]  Physical Therapy Evaluation  Patient Details  Name: Ryheem Jay MRN: [id number] Date of Birth: February 17, 1951 Referring Provider (PT): Dorthula Nettles, DO  Encounter Date: 04/27/2021   PT End of Session - 04/27/21 1059     Visit Number 1    Number of Visits 17    Date for PT Re-Evaluation 06/24/21    Authorization Type 1    Authorization Time Period 10    PT Start Time 1100    PT Stop Time 1155    PT Time Calculation (min) 55 min    Activity Tolerance Patient tolerated treatment well    Behavior During Therapy [facility] for tasks assessed/performed             Past Medical History:  Diagnosis Date   Arthritis    Cardiomyopathy (HCC)    a. 08/2016 Echo: EF 30-35%, diff HK; b. 03/2018 Ex MV (Jadali): Ex time 2:00, large anter defect, EF 24%; c. 03/2018 Echo: EF 20-25%, diff HK. Gr1 DD; d. Refused cath.   Chronic back pain    Chronic combined systolic (congestive) and diastolic (congestive) heart failure (HCC)    a. 08/2016 Echo: EF 30-35%, diff HK, mild to mod MR, mild BAE; b. 03/2018 Echo: EF 20-25%, diff HK. Gr1 DD. MIldly dil Ao root. Mild BAE. Mildly reduced RV fxn.   GERD (gastroesophageal reflux disease)    Gout    Hypertension    Obesity     Past Surgical History:  Procedure Laterality Date   EPIDURAL BLOCK INJECTION     chronic back pain    LEFT HEART CATH AND CORONARY ANGIOGRAPHY Left 12/27/2019   Procedure: LEFT HEART CATH AND CORONARY ANGIOGRAPHY;  Surgeon: Laurier Nancy, MD;  Location: [facility] INVASIVE CV LAB;  Service: Cardiovascular;  Laterality: Left;   TONSILLECTOMY      There were no vitals filed for this visit.    Subjective Assessment - 04/27/21 1104     Subjective L anterior shoulder:6/10 currently, 8/10 at worst for the past 2 weeks, 10/10 at times when he wakes up. L elbow joint (olecranon) 5/10  currently, 9/10 at most for the past 2 weeks.    Pertinent History L shoulder pain. Pain began about 1 month ago. L shoulder and L elbow bothers him. Might have slept wrong. Pt is R hand dominant. Had an x-ray which revealed arthritis.    Patient Stated Goals Reach behind his back and head, and reach in general without pain.    Currently in Pain? Yes    Pain Score 6     Pain Location Shoulder    Pain Orientation Left;Anterior    Pain Descriptors / Indicators Tightness;Throbbing   elbow: throbbing   Pain Type Acute pain    Pain Radiating Towards Also has L elbow (olecranon process) pain.    Pain Onset 1 to 4 weeks ago    Pain Frequency Constant    Aggravating Factors  Raising his L arm out to the side (abduction), reaching behind his head, back.    Effect of Pain on Daily Activities Resting                [facility] PT Assessment - 04/27/21 1102       Assessment   Medical Diagnosis Acute pain of L shoulder; L shoulder OA, impingement, enthesopathy    Referring Provider (PT) Dorthula Nettles,  DO    Onset Date/Surgical Date 04/14/21    Hand Dominance Right    Prior Therapy No known PT for current condition      Precautions   Precaution Comments No known precautions      Restrictions   Other Position/Activity Restrictions No known restrictions      Balance Screen   Has the patient fallen in the past 6 months No    Has the patient had a decrease in activity level because of a fear of falling?  No    Is the patient reluctant to leave their home because of a fear of falling?  No      Posture/Postural Control   Posture Comments forward neck, B protracted shoulders, L shoulder higher. L lateral shift      AROM   Overall AROM Comments L scapular protraction observed with L arm movement    Left Shoulder Flexion 65 Degrees   81 degrees AAROM   Left Shoulder ABduction 72 Degrees   84 degrees AAROM   Left Shoulder Internal Rotation --   Functional IR: L thumb to L3   Left Shoulder  External Rotation --   Functional ER: L hand to back of head with L anterior shoulder pain.   Cervical Flexion WFL    Cervical Extension WFL    Cervical - Right Side [facility] with L upper trap/scalene area pulling    Cervical - Left Side Bend WFL    Cervical - Right Rotation WFL    Cervical - Left Rotation WFL wiht L upper trap and scalene area pain      Strength   Overall Strength Comments L lower trap (manually resisted scapular retraction) 4/5    Left Shoulder Flexion 4-/5    Left Shoulder ABduction 4-/5    Left Shoulder Internal Rotation 4/5   with anterior shoulder pain   Left Shoulder External Rotation 4+/5      Palpation   Palpation comment B upper trap muscle tension L > R. No TTP      Special Tests   Other special tests (+) Hawkin's [place], empty can, Neer's impingement L shoulder                        Objective measurements completed on examination: See above findings.   Blood pressure is controlled per pt.  No latex allergies     Medbridge Access Code [email or web address]  Therapeutic exercise  Seated B scapular retraction 10x5 seconds for 3 sets   Improved exercise technique, movement at target joints, use of target muscles after min to mod verbal, visual, tactile cues.     Manual therapy  Seated STM L upper trap muscle Seated STM L distal pectoralis major muscle to decrease tension  Response to treatment Pt tolerated session well without aggravation of symptoms.   Clinical impression Pt is a 70 year old male who came to physical therapy secondary to L shoulder pain. He also presents with poor posture and glenohumeral mechanics, decreased L shoulder and scapular strength, positive special tests suggesting anterior shoulder impingement and supraspinatus involvement, muscle tension, and difficulty performing tasks which involve raising his L arm up as well as reaching. Pt will benefit from skilled physical therapy services to address the aforementioned  deficits.                    PT Education - 04/27/21 1202     Education Details ther-ex, HEP, plan of care  Person(s) Educated Patient    Methods Explanation;Demonstration;Tactile cues;Verbal cues;Handout    Comprehension Returned demonstration;Verbalized understanding              PT Short Term Goals - 04/27/21 1208       PT SHORT TERM GOAL #1   Title Pt will be independent with his initial HEP to decrease pain, improve L shoulder ROM, strength and function.    Baseline Pt has started his initial HEP (04/27/2021)    Time 3    Period Weeks    Status New    Target Date 05/20/21               PT Long Term Goals - 04/27/21 1210       PT LONG TERM GOAL #1   Title Pt will have a decrease in L shoulder pain to 4/10 or less at worst to promote ability to reach up, to the side, as well as behind his head and back more comfortably.    Baseline 10/10 L shoulder pain at most for the past 2 weeks. (04/27/2021)    Time 8    Period Weeks    Status New    Target Date 06/24/21      PT LONG TERM GOAL #2   Title Pt will improve L shoulder flexion and abduction AROM by at least 20 degrees to promote ability to reach with less difficulty.    Baseline L shoulder AROM: 65 degrees flexion, 72 degrees abduction (04/27/2021)    Time 8    Period Weeks    Status New    Target Date 06/24/21      PT LONG TERM GOAL #3   Title Pt will improve L shoulder strength by at least 1/2 MMT grade to promote ability to reach with less difficulty.    Baseline 4-/5 flexion, and abduction, 4/5 IR, 4+/5 ER, 4/5 lower trap (manually resisted scapular retraction targeting the muscle) 04/27/2021.    Time 8    Period Weeks    Status New    Target Date 06/24/21      PT LONG TERM GOAL #4   Title Pt will improve his FOTO score by at least 10 points as a demonstration of improved function.    Baseline Pt did not complete his initial FOTO (04/27/2021)    Time 8    Period Weeks    Status New     Target Date 06/24/21                    Plan - 04/27/21 1204     Clinical Impression Statement Pt is a 70 year old male who came to physical therapy secondary to L shoulder pain. He also presents with poor posture and glenohumeral mechanics, decreased L shoulder and scapular strength, positive special tests suggesting anterior shoulder impingement and supraspinatus involvement, muscle tension, and difficulty performing tasks which involve raising his L arm up as well as reaching. Pt will benefit from skilled physical therapy services to address the aforementioned deficits.    Personal Factors and Comorbidities Comorbidity 3+;Age;Time since onset of injury/illness/exacerbation;Fitness;Past/Current Experience    Comorbidities Arthritis, cardiomyopathy, chronic back pain, HTN, obesity    Examination-Activity Limitations Lift;Reach Overhead;Carry    Stability/Clinical Decision Making Stable/Uncomplicated    Clinical Decision Making Low    Rehab Potential Fair    PT Frequency 2x / week    PT Duration 8 weeks    PT Treatment/Interventions Therapeutic activities;Therapeutic exercise;Neuromuscular re-education;Patient/family education;Manual techniques;Dry needling;Spinal Manipulations;Joint  Manipulations;Electrical Stimulation;Iontophoresis 4mg /ml Dexamethasone    PT Next Visit Plan thoracic extension, scapular strengthening, posture, manual techniques, modalities PRN    PT Home Exercise Plan Medbridge Access Code [email or web address]    Consulted and Agree with Plan of Care Patient             Patient will benefit from skilled therapeutic intervention in order to improve the following deficits and impairments:  Improper body mechanics, Pain, Postural dysfunction, Decreased strength, Decreased range of motion  Visit Diagnosis: Acute pain of left shoulder - Plan: PT plan of care cert/re-cert  Muscle weakness (generalized) - Plan: PT plan of care cert/re-cert     Problem List Patient  Active Problem List   Diagnosis Date Noted   Acute coronary syndrome (HCC) 11/28/2019   11/30/2019 PT, DPT   04/27/2021, 12:27 PM  [place] [facility] [address], [address], [place] Phone: [phone]   Fax:  [phone]  Name: William Schake MRN: [id number] Gula Date of Birth: 07-25-1951

## 2021-04-27 NOTE — Patient Instructions (Signed)
Access Code: [email or web address] URL: [email or web address] Date: 04/27/2021 Prepared by: Loralyn Freshwater  Exercises Seated Scapular Retraction - 1 x daily - 7 x weekly - 3 sets - 10 reps - 5 seconds hold

## 2021-05-04 ENCOUNTER — Telehealth: Payer: Self-pay

## 2021-05-04 ENCOUNTER — Ambulatory Visit: Payer: Medicare Other | Attending: Sports Medicine

## 2021-05-04 DIAGNOSIS — M6281 Muscle weakness (generalized): Secondary | ICD-10-CM | POA: Insufficient documentation

## 2021-05-04 DIAGNOSIS — M25512 Pain in left shoulder: Secondary | ICD-10-CM | POA: Insufficient documentation

## 2021-05-04 NOTE — Telephone Encounter (Signed)
No show. Called patient who said that no one called him back to let him know about his appointments. Let pt know when his next 2 appointments are and to stop by the front desk after next visit to work on the rest of the schedule. Pt verbalized understanding.

## 2021-05-11 ENCOUNTER — Ambulatory Visit: Payer: Medicare Other

## 2021-05-11 DIAGNOSIS — M6281 Muscle weakness (generalized): Secondary | ICD-10-CM | POA: Diagnosis present

## 2021-05-11 DIAGNOSIS — M25512 Pain in left shoulder: Secondary | ICD-10-CM

## 2021-05-11 NOTE — Therapy (Signed)
[place] [facility] [address] [place], [address] Phone: [phone]   Fax:  [phone]  Physical Therapy Treatment  Patient Details  Name: Ronald Horne MRN: [id number] Date of Birth: 07-19-1951 Referring Provider (PT): Dorthula Nettles, DO   Encounter Date: 05/11/2021   PT End of Session - 05/11/21 1002     Visit Number 2    Number of Visits 17    Date for PT Re-Evaluation 06/24/21    Authorization Type 2    Authorization Time Period 10    PT Start Time 1002    PT Stop Time 1045    PT Time Calculation (min) 43 min    Activity Tolerance Patient tolerated treatment well    Behavior During Therapy [facility] for tasks assessed/performed             Past Medical History:  Diagnosis Date   Arthritis    Cardiomyopathy (HCC)    a. 08/2016 Echo: EF 30-35%, diff HK; b. 03/2018 Ex MV (Jadali): Ex time 2:00, large anter defect, EF 24%; c. 03/2018 Echo: EF 20-25%, diff HK. Gr1 DD; d. Refused cath.   Chronic back pain    Chronic combined systolic (congestive) and diastolic (congestive) heart failure (HCC)    a. 08/2016 Echo: EF 30-35%, diff HK, mild to mod MR, mild BAE; b. 03/2018 Echo: EF 20-25%, diff HK. Gr1 DD. MIldly dil Ao root. Mild BAE. Mildly reduced RV fxn.   GERD (gastroesophageal reflux disease)    Gout    Hypertension    Obesity     Past Surgical History:  Procedure Laterality Date   EPIDURAL BLOCK INJECTION     chronic back pain    LEFT HEART CATH AND CORONARY ANGIOGRAPHY Left 12/27/2019   Procedure: LEFT HEART CATH AND CORONARY ANGIOGRAPHY;  Surgeon: Laurier Nancy, MD;  Location: [facility] INVASIVE CV LAB;  Service: Cardiovascular;  Laterality: Left;   TONSILLECTOMY      There were no vitals filed for this visit.   Subjective Assessment - 05/11/21 1003     Subjective L shoulder is about the same as it was. Still stiff and sore at the elbow. 8/10 L shoulder pain currently. Feels pain especially when he  first wakes up.    Pertinent History L shoulder pain. Pain began about 1 month ago. L shoulder and L elbow bothers him. Might have slept wrong. Pt is R hand dominant. Had an x-ray which revealed arthritis.    Patient Stated Goals Reach behind his back and head, and reach in general without pain.    Currently in Pain? Yes    Pain Score 8     Pain Onset 1 to 4 weeks ago                                       PT Education - 05/11/21 1023     Education Details ther-ex    [employer]) Educated Patient    Methods Explanation;Demonstration;Tactile cues;Verbal cues    Comprehension Returned demonstration;Verbalized understanding              Objective    Blood pressure is controlled per pt. No latex allergies         Medbridge Access Code [email or web address]   Therapeutic exercise   Seated B scapular retraction 10x5 seconds for 3 sets   Then with PT manual resistance targeting the lower  trap muscles 10x2 with 5 seconds  Sitting with L arm in slight flexion, elbow bent to around 80 degrees flexion   Manually resisted L elbow extension isometrics to promote posterior glide of L humeral head 10x3 with 5 second holds    ER isometrics 10x5 seconds  L shoulder flexion AROM after session 91 degrees (improvement)   Improved exercise technique, movement at target joints, use of target muscles after min to mod verbal, visual, tactile cues.        Manual therapy Seated STM L upper trap muscle Seated STM L distal pectoralis major muscle to decrease tension   Decreased L shoulder pain to 6/10 after session.     Response to treatment Pt tolerated session well without aggravation of symptoms. Pt states L shoulder feels better after session.    Clinical impression Worked on improving L scapular mechanics with cues for L scapular retraction and posterior tipping. Also worked on improving L lower trap, and infraspinatus muscle strength, as well as improving posterior  glide of humeral head and decreasing pectoralis and upper trap muscle tension. Decreased L shoulder pain reported by pt after session. Pt will benefit from continued skilled physical therapy services to decrease pain, improve strength and function.      PT Short Term Goals - 04/27/21 1208       PT SHORT TERM GOAL #1   Title Pt will be independent with his initial HEP to decrease pain, improve L shoulder ROM, strength and function.    Baseline Pt has started his initial HEP (04/27/2021)    Time 3    Period Weeks    Status New    Target Date 05/20/21               PT Long Term Goals - 04/27/21 1210       PT LONG TERM GOAL #1   Title Pt will have a decrease in L shoulder pain to 4/10 or less at worst to promote ability to reach up, to the side, as well as behind his head and back more comfortably.    Baseline 10/10 L shoulder pain at most for the past 2 weeks. (04/27/2021)    Time 8    Period Weeks    Status New    Target Date 06/24/21      PT LONG TERM GOAL #2   Title Pt will improve L shoulder flexion and abduction AROM by at least 20 degrees to promote ability to reach with less difficulty.    Baseline L shoulder AROM: 65 degrees flexion, 72 degrees abduction (04/27/2021)    Time 8    Period Weeks    Status New    Target Date 06/24/21      PT LONG TERM GOAL #3   Title Pt will improve L shoulder strength by at least 1/2 MMT grade to promote ability to reach with less difficulty.    Baseline 4-/5 flexion, and abduction, 4/5 IR, 4+/5 ER, 4/5 lower trap (manually resisted scapular retraction targeting the muscle) 04/27/2021.    Time 8    Period Weeks    Status New    Target Date 06/24/21      PT LONG TERM GOAL #4   Title Pt will improve his FOTO score by at least 10 points as a demonstration of improved function.    Baseline Pt did not complete his initial FOTO (04/27/2021)    Time 8    Period Weeks    Status New  Target Date 06/24/21                    Plan - 05/11/21 1002     Clinical Impression Statement Worked on improving L scapular mechanics with cues for L scapular retraction and posterior tipping. Also worked on improving L lower trap, and infraspinatus muscle strength, as well as improving posterior glide of humeral head and decreasing pectoralis and upper trap muscle tension. Decreased L shoulder pain reported by pt after session. Pt will benefit from continued skilled physical therapy services to decrease pain, improve strength and function.    Personal Factors and Comorbidities Comorbidity 3+;Age;Time since onset of injury/illness/exacerbation;Fitness;Past/Current Experience    Comorbidities Arthritis, cardiomyopathy, chronic back pain, HTN, obesity    Examination-Activity Limitations Lift;Reach Overhead;Carry    Stability/Clinical Decision Making Stable/Uncomplicated    Rehab Potential Fair    PT Frequency 2x / week    PT Duration 8 weeks    PT Treatment/Interventions Therapeutic activities;Therapeutic exercise;Neuromuscular re-education;Patient/family education;Manual techniques;Dry needling;Spinal Manipulations;Joint Manipulations;Electrical Stimulation;Iontophoresis 4mg /ml Dexamethasone    PT Next Visit Plan thoracic extension, scapular strengthening, posture, manual techniques, modalities PRN    PT Home Exercise Plan Medbridge Access Code [email or web address]    Consulted and Agree with Plan of Care Patient             Patient will benefit from skilled therapeutic intervention in order to improve the following deficits and impairments:  Improper body mechanics, Pain, Postural dysfunction, Decreased strength, Decreased range of motion  Visit Diagnosis: Acute pain of left shoulder  Muscle weakness (generalized)     Problem List Patient Active Problem List   Diagnosis Date Noted   Acute coronary syndrome (HCC) 11/28/2019    11/30/2019 PT, DPT   05/11/2021, 10:51 AM  [place] [facility] [address], [address], [place] Phone: [phone]   Fax:  [phone]  Name: Satvik Parco MRN: Joanette Gula Date of Birth: 1951-08-30

## 2021-05-17 ENCOUNTER — Ambulatory Visit: Payer: Medicare Other

## 2021-05-17 DIAGNOSIS — M25512 Pain in left shoulder: Secondary | ICD-10-CM | POA: Diagnosis not present

## 2021-05-17 DIAGNOSIS — M6281 Muscle weakness (generalized): Secondary | ICD-10-CM

## 2021-05-17 NOTE — Therapy (Signed)
[place] [facility] [address], [place], [address] Phone: [phone]   Fax:  [phone]  Physical Therapy Treatment  Patient Details  Name: Ronald Horne MRN: [id number] Date of Birth: 02-03-1951 Referring Provider (PT): Dorthula Nettles, DO   Encounter Date: 05/17/2021   PT End of Session - 05/17/21 1144     Visit Number 3    Number of Visits 17    Date for PT Re-Evaluation 06/24/21    Authorization Type 3    Authorization Time Period 10    PT Start Time 1144    PT Stop Time 1225    PT Time Calculation (min) 41 min    Activity Tolerance Patient tolerated treatment well    Behavior During Therapy [facility] for tasks assessed/performed             Past Medical History:  Diagnosis Date   Arthritis    Cardiomyopathy (HCC)    a. 08/2016 Echo: EF 30-35%, diff HK; b. 03/2018 Ex MV (Jadali): Ex time 2:00, large anter defect, EF 24%; c. 03/2018 Echo: EF 20-25%, diff HK. Gr1 DD; d. Refused cath.   Chronic back pain    Chronic combined systolic (congestive) and diastolic (congestive) heart failure (HCC)    a. 08/2016 Echo: EF 30-35%, diff HK, mild to mod MR, mild BAE; b. 03/2018 Echo: EF 20-25%, diff HK. Gr1 DD. MIldly dil Ao root. Mild BAE. Mildly reduced RV fxn.   GERD (gastroesophageal reflux disease)    Gout    Hypertension    Obesity     Past Surgical History:  Procedure Laterality Date   EPIDURAL BLOCK INJECTION     chronic back pain    LEFT HEART CATH AND CORONARY ANGIOGRAPHY Left 12/27/2019   Procedure: LEFT HEART CATH AND CORONARY ANGIOGRAPHY;  Surgeon: Laurier Nancy, MD;  Location: [facility] INVASIVE CV LAB;  Service: Cardiovascular;  Laterality: Left;   TONSILLECTOMY      There were no vitals filed for this visit.   Subjective Assessment - 05/17/21 1145     Subjective L shoulder got back to the same. The relief was temporary. 7/10 currently.    Pertinent History L shoulder pain. Pain began about 1  month ago. L shoulder and L elbow bothers him. Might have slept wrong. Pt is R hand dominant. Had an x-ray which revealed arthritis.    Patient Stated Goals Reach behind his back and head, and reach in general without pain.    Currently in Pain? Yes    Pain Score 7     Pain Onset 1 to 4 weeks ago                                       PT Education - 05/17/21 1156     Education Details ther-ex, HEP    Person(s) Educated Patient    Methods Explanation;Demonstration;Tactile cues;Verbal cues;Handout    Comprehension Returned demonstration;Verbalized understanding             Objective     Blood pressure is controlled per pt. No latex allergies       Medbridge Access Code [email or web address]   Therapeutic exercise   Seated B scapular retraction green band 10x5 seconds for 3 sets  Seated L shoulder ER yellow band 10x, then 5x  Seated L triceps extension isometrics, L forearm against arm rest 10x3 with 5 second  holds   Seated scapular retraction with PT manual resistance targeting the lower trap muscles  L 10x2 with 5 seconds for 3 sets  Sitting with L arm in slight flexion, elbow bent to around 80 degrees flexion             Manually resisted L elbow extension/shoulder extension isometrics to promote posterior glide of L humeral head 10x3 with 5 second holds               ER isometrics 10x5 seconds for 2 sets  Decreased L shoulder pain with manually resisted exercises.     Improved exercise technique, movement at target joints, use of target muscles after min to mod verbal, visual, tactile cues.        Manual therapy  Seated STM L upper trap muscle Seated STM L distal pectoralis major muscle to decrease tension    Response to treatment Pt tolerated session well without aggravation of symptoms.      Clinical impression Continued working on improving scapular mechanics as well as promoting posterior glide of L humeral head. Decreased L shoulder  pain after manually resisted exercises.  Pt will benefit from continued skilled physical therapy services to decrease pain, improve strength and function.         PT Short Term Goals - 04/27/21 1208       PT SHORT TERM GOAL #1   Title Pt will be independent with his initial HEP to decrease pain, improve L shoulder ROM, strength and function.    Baseline Pt has started his initial HEP (04/27/2021)    Time 3    Period Weeks    Status New    Target Date 05/20/21               PT Long Term Goals - 04/27/21 1210       PT LONG TERM GOAL #1   Title Pt will have a decrease in L shoulder pain to 4/10 or less at worst to promote ability to reach up, to the side, as well as behind his head and back more comfortably.    Baseline 10/10 L shoulder pain at most for the past 2 weeks. (04/27/2021)    Time 8    Period Weeks    Status New    Target Date 06/24/21      PT LONG TERM GOAL #2   Title Pt will improve L shoulder flexion and abduction AROM by at least 20 degrees to promote ability to reach with less difficulty.    Baseline L shoulder AROM: 65 degrees flexion, 72 degrees abduction (04/27/2021)    Time 8    Period Weeks    Status New    Target Date 06/24/21      PT LONG TERM GOAL #3   Title Pt will improve L shoulder strength by at least 1/2 MMT grade to promote ability to reach with less difficulty.    Baseline 4-/5 flexion, and abduction, 4/5 IR, 4+/5 ER, 4/5 lower trap (manually resisted scapular retraction targeting the muscle) 04/27/2021.    Time 8    Period Weeks    Status New    Target Date 06/24/21      PT LONG TERM GOAL #4   Title Pt will improve his FOTO score by at least 10 points as a demonstration of improved function.    Baseline Pt did not complete his initial FOTO (04/27/2021)    Time 8    Period Weeks  Status New    Target Date 06/24/21                   Plan - 05/17/21 1142     Clinical Impression Statement Continued working on improving  scapular mechanics as well as promoting posterior glide of L humeral head. Decreased L shoulder pain after manually resisted exercises.  Pt will benefit from continued skilled physical therapy services to decrease pain, improve strength and function.    Personal Factors and Comorbidities Comorbidity 3+;Age;Time since onset of injury/illness/exacerbation;Fitness;Past/Current Experience    Comorbidities Arthritis, cardiomyopathy, chronic back pain, HTN, obesity    Examination-Activity Limitations Lift;Reach Overhead;Carry    Stability/Clinical Decision Making Stable/Uncomplicated    Rehab Potential Fair    PT Frequency 2x / week    PT Duration 8 weeks    PT Treatment/Interventions Therapeutic activities;Therapeutic exercise;Neuromuscular re-education;Patient/family education;Manual techniques;Dry needling;Spinal Manipulations;Joint Manipulations;Electrical Stimulation;Iontophoresis 4mg /ml Dexamethasone    PT Next Visit Plan thoracic extension, scapular strengthening, posture, manual techniques, modalities PRN    PT Home Exercise Plan Medbridge Access Code [email or web address]    Consulted and Agree with Plan of Care Patient             Patient will benefit from skilled therapeutic intervention in order to improve the following deficits and impairments:  Improper body mechanics, Pain, Postural dysfunction, Decreased strength, Decreased range of motion  Visit Diagnosis: Acute pain of left shoulder  Muscle weakness (generalized)     Problem List Patient Active Problem List   Diagnosis Date Noted   Acute coronary syndrome (HCC) 11/28/2019   11/30/2019 PT, DPT  05/17/2021, 12:47 PM  [place] [facility] [address], [address], [place] Phone: [phone]   Fax:  [phone]  Name: Ronald Horne MRN: Joanette Gula Date of Birth: [birth year]

## 2021-05-17 NOTE — Patient Instructions (Signed)
Access Code: [email or web address] URL: [email or web address] Date: 05/17/2021 Prepared by: Loralyn Freshwater  Exercises Seated Scapular Retraction - 1 x daily - 7 x weekly - 3 sets - 10 reps - 5 seconds hold Scapular Retraction with Resistance - 1 x daily - 7 x weekly - 3 sets - 10 reps - 5 seconds hold

## 2021-05-19 ENCOUNTER — Ambulatory Visit: Payer: Medicare Other

## 2021-05-24 ENCOUNTER — Ambulatory Visit: Payer: Medicare Other

## 2021-05-26 ENCOUNTER — Ambulatory Visit: Payer: Medicare Other

## 2021-06-01 ENCOUNTER — Ambulatory Visit: Payer: Medicare Other | Attending: Sports Medicine

## 2021-06-01 DIAGNOSIS — M25512 Pain in left shoulder: Secondary | ICD-10-CM | POA: Insufficient documentation

## 2021-06-01 DIAGNOSIS — M6281 Muscle weakness (generalized): Secondary | ICD-10-CM | POA: Insufficient documentation

## 2021-06-03 ENCOUNTER — Ambulatory Visit: Payer: Medicare Other

## 2021-06-03 DIAGNOSIS — M6281 Muscle weakness (generalized): Secondary | ICD-10-CM

## 2021-06-03 DIAGNOSIS — M25512 Pain in left shoulder: Secondary | ICD-10-CM | POA: Diagnosis not present

## 2021-06-03 NOTE — Therapy (Signed)
[place] [facility] [address] [place], [address] Phone: [phone]   Fax:  [phone]  Physical Therapy Treatment  Patient Details  Name: Ronald Horne MRN: [id number] Date of Birth: 08/09/1951 Referring Provider (PT): Dorthula Nettles, DO   Encounter Date: 06/03/2021   PT End of Session - 06/03/21 1020     Visit Number 4    Number of Visits 17    Date for PT Re-Evaluation 06/24/21    Authorization Type 4    Authorization Time Period 10    PT Start Time 1020    PT Stop Time 1100    PT Time Calculation (min) 40 min    Activity Tolerance Patient tolerated treatment well    Behavior During Therapy [facility] for tasks assessed/performed             Past Medical History:  Diagnosis Date   Arthritis    Cardiomyopathy (HCC)    a. 08/2016 Echo: EF 30-35%, diff HK; b. 03/2018 Ex MV (Jadali): Ex time 2:00, large anter defect, EF 24%; c. 03/2018 Echo: EF 20-25%, diff HK. Gr1 DD; d. Refused cath.   Chronic back pain    Chronic combined systolic (congestive) and diastolic (congestive) heart failure (HCC)    a. 08/2016 Echo: EF 30-35%, diff HK, mild to mod MR, mild BAE; b. 03/2018 Echo: EF 20-25%, diff HK. Gr1 DD. MIldly dil Ao root. Mild BAE. Mildly reduced RV fxn.   GERD (gastroesophageal reflux disease)    Gout    Hypertension    Obesity     Past Surgical History:  Procedure Laterality Date   EPIDURAL BLOCK INJECTION     chronic back pain    LEFT HEART CATH AND CORONARY ANGIOGRAPHY Left 12/27/2019   Procedure: LEFT HEART CATH AND CORONARY ANGIOGRAPHY;  Surgeon: Laurier Nancy, MD;  Location: [facility] INVASIVE CV LAB;  Service: Cardiovascular;  Laterality: Left;   TONSILLECTOMY      There were no vitals filed for this visit.   Subjective Assessment - 06/03/21 1021     Subjective Had a little flare up the other day. Had to get a shot. A little better. Has bursitis, arthritis, rotator cuff. 6-7/10 currently (anterior  shoulder).    Pertinent History L shoulder pain. Pain began about 1 month ago. L shoulder and L elbow bothers him. Might have slept wrong. Pt is R hand dominant. Had an x-ray which revealed arthritis.    Patient Stated Goals Reach behind his back and head, and reach in general without pain.    Currently in Pain? Yes    Pain Score 7                                        PT Education - 06/03/21 1257     Education Details ther-ex    Person(s) Educated Patient    Methods Explanation;Demonstration;Tactile cues;Verbal cues    Comprehension Returned demonstration;Verbalized understanding              Objective     Blood pressure is controlled per pt. No latex allergies       Medbridge Access Code [email or web address]   Manual therapy Seated STM L lower trap muscle area to decrease fascial restrictions  Seated STM L upper trap muscle to decrease muscle tension and fascial restrictions   Seasted sustained A to P pressure to L humeral  head to promote posterior glide  Seated gentle inferior L shoulder manual distraction to improve joint space and mobility for movement.   Seated STM L pectoralis major muscle to decrease tension and fascial restrictions   Decreased L shoulder pain to 6/10     Therapeutic exercise   Sitting with L arm in slight flexion, elbow bent to around 80 degrees flexion                        Manually resisted  ER isometrics 10x5 seconds for 2 sets  Manually resisted L elbow extension/shoulder extension isometrics to promote posterior glide of L humeral head 10x2 with 5 second holds   Seated L cross arm stretch 5x10 seconds for 2 sets  Slight L anterior shoulder tightness afterwards      Improved exercise technique, movement at target joints, use of target muscles after min to mod verbal, visual, tactile cues.        Response to treatment Pt tolerated session well without aggravation of symptoms.       Clinical  impression Continued working on decreasing soft tissue restrictions L upper trap, lower trap, and pectoralis muscle as well as improving L glenohumeral joint mobility to promote better mechanics at his shoulder and decrease pain with flexion. Improved ease with L shoulder flexion AROM observed after posterior shoulder joint mobilization treatment. Continued working on improving rotator cuff muscle and posterior shoulder strengthening to promote posterior glide of humeral head with raising his arm. Pt tolerated session well without aggravation of symptoms.  Pt will benefit from continued skilled physical therapy services to decrease pain, improve strength and function.      PT Short Term Goals - 04/27/21 1208       PT SHORT TERM GOAL #1   Title Pt will be independent with his initial HEP to decrease pain, improve L shoulder ROM, strength and function.    Baseline Pt has started his initial HEP (04/27/2021)    Time 3    Period Weeks    Status New    Target Date 05/20/21               PT Long Term Goals - 04/27/21 1210       PT LONG TERM GOAL #1   Title Pt will have a decrease in L shoulder pain to 4/10 or less at worst to promote ability to reach up, to the side, as well as behind his head and back more comfortably.    Baseline 10/10 L shoulder pain at most for the past 2 weeks. (04/27/2021)    Time 8    Period Weeks    Status New    Target Date 06/24/21      PT LONG TERM GOAL #2   Title Pt will improve L shoulder flexion and abduction AROM by at least 20 degrees to promote ability to reach with less difficulty.    Baseline L shoulder AROM: 65 degrees flexion, 72 degrees abduction (04/27/2021)    Time 8    Period Weeks    Status New    Target Date 06/24/21      PT LONG TERM GOAL #3   Title Pt will improve L shoulder strength by at least 1/2 MMT grade to promote ability to reach with less difficulty.    Baseline 4-/5 flexion, and abduction, 4/5 IR, 4+/5 ER, 4/5 lower trap  (manually resisted scapular retraction targeting the muscle) 04/27/2021.    Time 8  Period Weeks    Status New    Target Date 06/24/21      PT LONG TERM GOAL #4   Title Pt will improve his FOTO score by at least 10 points as a demonstration of improved function.    Baseline Pt did not complete his initial FOTO (04/27/2021)    Time 8    Period Weeks    Status New    Target Date 06/24/21                   Plan - 06/03/21 1107     Clinical Impression Statement Continued working on decreasing soft tissue restrictions L upper trap, lower trap, and pectoralis muscle as well as improving L glenohumeral joint mobility to promote better mechanics at his shoulder and decrease pain with flexion. Improved ease with L shoulder flexion AROM observed after posterior shoulder joint mobilization treatment. Continued working on improving rotator cuff muscle and posterior shoulder strengthening to promote posterior glide of humeral head with raising his arm. Pt tolerated session well without aggravation of symptoms.  Pt will benefit from continued skilled physical therapy services to decrease pain, improve strength and function.    Personal Factors and Comorbidities Comorbidity 3+;Age;Time since onset of injury/illness/exacerbation;Fitness;Past/Current Experience    Comorbidities Arthritis, cardiomyopathy, chronic back pain, HTN, obesity    Examination-Activity Limitations Lift;Reach Overhead;Carry    Stability/Clinical Decision Making Stable/Uncomplicated    Rehab Potential Fair    PT Frequency 2x / week    PT Duration 8 weeks    PT Treatment/Interventions Therapeutic activities;Therapeutic exercise;Neuromuscular re-education;Patient/family education;Manual techniques;Dry needling;Spinal Manipulations;Joint Manipulations;Electrical Stimulation;Iontophoresis 4mg /ml Dexamethasone    PT Next Visit Plan thoracic extension, scapular strengthening, posture, manual techniques, modalities PRN    PT Home  Exercise Plan Medbridge Access Code [email or web address]    Consulted and Agree with Plan of Care Patient             Patient will benefit from skilled therapeutic intervention in order to improve the following deficits and impairments:  Improper body mechanics, Pain, Postural dysfunction, Decreased strength, Decreased range of motion  Visit Diagnosis: Acute pain of left shoulder  Muscle weakness (generalized)     Problem List Patient Active Problem List   Diagnosis Date Noted   Acute coronary syndrome (HCC) 11/28/2019   11/30/2019 PT, DPT   06/03/2021, 12:59 PM  [place] [facility] [address], [address], [place] Phone: [phone]   Fax:  [phone]  Name: Ronald Horne MRN: Joanette Gula Date of Birth: [birth year]

## 2021-06-08 ENCOUNTER — Ambulatory Visit: Payer: Medicare Other

## 2021-06-08 DIAGNOSIS — M25512 Pain in left shoulder: Secondary | ICD-10-CM | POA: Diagnosis not present

## 2021-06-08 DIAGNOSIS — M6281 Muscle weakness (generalized): Secondary | ICD-10-CM

## 2021-06-08 NOTE — Therapy (Signed)
[place] [facility] [address], [place], [address] Phone: [phone]   Fax:  [phone]  Physical Therapy Treatment  Patient Details  Name: Ronald Horne MRN: [id number] Date of Birth: 1951/07/15 Referring Provider (PT): Dorthula Nettles, DO   Encounter Date: 06/08/2021   PT End of Session - 06/08/21 1017     Visit Number 5    Number of Visits 17    Date for PT Re-Evaluation 06/24/21    Authorization Type 5    Authorization Time Period 10    PT Start Time 1017    PT Stop Time 1059    PT Time Calculation (min) 42 min    Activity Tolerance Patient tolerated treatment well    Behavior During Therapy [facility] for tasks assessed/performed             Past Medical History:  Diagnosis Date   Arthritis    Cardiomyopathy (HCC)    a. 08/2016 Echo: EF 30-35%, diff HK; b. 03/2018 Ex MV (Jadali): Ex time 2:00, large anter defect, EF 24%; c. 03/2018 Echo: EF 20-25%, diff HK. Gr1 DD; d. Refused cath.   Chronic back pain    Chronic combined systolic (congestive) and diastolic (congestive) heart failure (HCC)    a. 08/2016 Echo: EF 30-35%, diff HK, mild to mod MR, mild BAE; b. 03/2018 Echo: EF 20-25%, diff HK. Gr1 DD. MIldly dil Ao root. Mild BAE. Mildly reduced RV fxn.   GERD (gastroesophageal reflux disease)    Gout    Hypertension    Obesity     Past Surgical History:  Procedure Laterality Date   EPIDURAL BLOCK INJECTION     chronic back pain    LEFT HEART CATH AND CORONARY ANGIOGRAPHY Left 12/27/2019   Procedure: LEFT HEART CATH AND CORONARY ANGIOGRAPHY;  Surgeon: Laurier Nancy, MD;  Location: [facility] INVASIVE CV LAB;  Service: Cardiovascular;  Laterality: Left;   TONSILLECTOMY      There were no vitals filed for this visit.   Subjective Assessment - 06/08/21 1018     Subjective L shoulder is waking up. Manageable pain. 6/10 L shoulder pain currently. Was better after last session.    Pertinent History L shoulder  pain. Pain began about 1 month ago. L shoulder and L elbow bothers him. Might have slept wrong. Pt is R hand dominant. Had an x-ray which revealed arthritis.    Patient Stated Goals Reach behind his back and head, and reach in general without pain.    Currently in Pain? Yes    Pain Score 6                                        PT Education - 06/08/21 1021     Education Details ther-ex    Person(s) Educated Patient    Methods Explanation;Demonstration;Tactile cues;Verbal cues    Comprehension Returned demonstration;Verbalized understanding             Objective     Blood pressure is controlled per pt. No latex allergies       Medbridge Access Code [email or web address]   Manual therapy  Reclined   L shoulder mobilization inferior, poisterior glide grade 3 to 3+ to decrease stiffness  STM L pectoralis major muscle to decrease tension   STM L upper trap area to decrease fascial restrictions        Therapeutic  exercise  Seated self L shoulder inferior mob 10x5  seconds for 3 sets  Decreased L shoulder tightness per pt.   Seated L shoulder ER isometrics 10x3 with 5 second holds   Decreased muscle tension per pt.   Reclined   L shoulder flexion with scapular retraction 10x2   Hooklying open books 3x 5 seconds. Difficult for shoulder   seated B scapular retraction 10x5 seconds      Improved exercise technique, movement at target joints, use of target muscles after min to mod verbal, visual, tactile cues.        Response to treatment Pt tolerated session well without aggravation of symptoms. Decreased L shoulder tightness reported after treatment. Decreased L shoulder pain to 5/10 after session .        Clinical impression Decreased L shoulder pain with treatment to decrease posterior and inferior glenohumeral joint tightness, L pectoralis muscle tension and fascial restrictions L upper trap area. Pt will benefit from continued skilled  physical therapy services to decrease pain, improve strength and function.           PT Short Term Goals - 04/27/21 1208       PT SHORT TERM GOAL #1   Title Pt will be independent with his initial HEP to decrease pain, improve L shoulder ROM, strength and function.    Baseline Pt has started his initial HEP (04/27/2021)    Time 3    Period Weeks    Status New    Target Date 05/20/21               PT Long Term Goals - 04/27/21 1210       PT LONG TERM GOAL #1   Title Pt will have a decrease in L shoulder pain to 4/10 or less at worst to promote ability to reach up, to the side, as well as behind his head and back more comfortably.    Baseline 10/10 L shoulder pain at most for the past 2 weeks. (04/27/2021)    Time 8    Period Weeks    Status New    Target Date 06/24/21      PT LONG TERM GOAL #2   Title Pt will improve L shoulder flexion and abduction AROM by at least 20 degrees to promote ability to reach with less difficulty.    Baseline L shoulder AROM: 65 degrees flexion, 72 degrees abduction (04/27/2021)    Time 8    Period Weeks    Status New    Target Date 06/24/21      PT LONG TERM GOAL #3   Title Pt will improve L shoulder strength by at least 1/2 MMT grade to promote ability to reach with less difficulty.    Baseline 4-/5 flexion, and abduction, 4/5 IR, 4+/5 ER, 4/5 lower trap (manually resisted scapular retraction targeting the muscle) 04/27/2021.    Time 8    Period Weeks    Status New    Target Date 06/24/21      PT LONG TERM GOAL #4   Title Pt will improve his FOTO score by at least 10 points as a demonstration of improved function.    Baseline Pt did not complete his initial FOTO (04/27/2021)    Time 8    Period Weeks    Status New    Target Date 06/24/21                   Plan - 06/08/21 1022  Clinical Impression Statement Decreased L shoulder pain with treatment to decrease posterior and inferior glenohumeral joint tightness, L  pectoralis muscle tension and fascial restrictions L upper trap area. Pt will benefit from continued skilled physical therapy services to decrease pain, improve strength and function.    Personal Factors and Comorbidities Comorbidity 3+;Age;Time since onset of injury/illness/exacerbation;Fitness;Past/Current Experience    Comorbidities Arthritis, cardiomyopathy, chronic back pain, HTN, obesity    Examination-Activity Limitations Lift;Reach Overhead;Carry    Stability/Clinical Decision Making Stable/Uncomplicated    Rehab Potential Fair    PT Frequency 2x / week    PT Duration 8 weeks    PT Treatment/Interventions Therapeutic activities;Therapeutic exercise;Neuromuscular re-education;Patient/family education;Manual techniques;Dry needling;Spinal Manipulations;Joint Manipulations;Electrical Stimulation;Iontophoresis 4mg /ml Dexamethasone    PT Next Visit Plan thoracic extension, scapular strengthening, posture, manual techniques, modalities PRN    PT Home Exercise Plan Medbridge Access Code [email or web address]    Consulted and Agree with Plan of Care Patient             Patient will benefit from skilled therapeutic intervention in order to improve the following deficits and impairments:  Improper body mechanics, Pain, Postural dysfunction, Decreased strength, Decreased range of motion  Visit Diagnosis: Acute pain of left shoulder  Muscle weakness (generalized)     Problem List Patient Active Problem List   Diagnosis Date Noted   Acute coronary syndrome (HCC) 11/28/2019    11/30/2019 PT, DPT   06/08/2021, 1:05 PM  [place] [facility] [address], [place] Phone: [phone]   Fax:  [phone]  Name: Ronald Horne MRN: Joanette Gula Date of Birth: 11-Jul-1951

## 2021-06-10 ENCOUNTER — Ambulatory Visit: Payer: Medicare Other

## 2021-06-10 DIAGNOSIS — M6281 Muscle weakness (generalized): Secondary | ICD-10-CM

## 2021-06-10 DIAGNOSIS — M25512 Pain in left shoulder: Secondary | ICD-10-CM | POA: Diagnosis not present

## 2021-06-10 NOTE — Therapy (Signed)
[place] [facility] [address], [place], [address] Phone: [phone]   Fax:  [phone]  Physical Therapy Treatment  Patient Details  Name: Ronald Horne MRN: [id number] Date of Birth: [birth year] Referring Provider (PT): Dorthula Nettles, DO   Encounter Date: 06/10/2021   PT End of Session - 06/10/21 1017     Visit Number 6    Number of Visits 17    Date for PT Re-Evaluation 06/24/21    Authorization Type 6    Authorization Time Period 10    PT Start Time 1017    PT Stop Time 1101    PT Time Calculation (min) 44 min    Activity Tolerance Patient tolerated treatment well    Behavior During Therapy [facility] for tasks assessed/performed             Past Medical History:  Diagnosis Date   Arthritis    Cardiomyopathy (HCC)    a. 08/2016 Echo: EF 30-35%, diff HK; b. 03/2018 Ex MV (Jadali): Ex time 2:00, large anter defect, EF 24%; c. 03/2018 Echo: EF 20-25%, diff HK. Gr1 DD; d. Refused cath.   Chronic back pain    Chronic combined systolic (congestive) and diastolic (congestive) heart failure (HCC)    a. 08/2016 Echo: EF 30-35%, diff HK, mild to mod MR, mild BAE; b. 03/2018 Echo: EF 20-25%, diff HK. Gr1 DD. MIldly dil Ao root. Mild BAE. Mildly reduced RV fxn.   GERD (gastroesophageal reflux disease)    Gout    Hypertension    Obesity     Past Surgical History:  Procedure Laterality Date   EPIDURAL BLOCK INJECTION     chronic back pain    LEFT HEART CATH AND CORONARY ANGIOGRAPHY Left 12/27/2019   Procedure: LEFT HEART CATH AND CORONARY ANGIOGRAPHY;  Surgeon: Laurier Nancy, MD;  Location: [facility] INVASIVE CV LAB;  Service: Cardiovascular;  Laterality: Left;   TONSILLECTOMY      There were no vitals filed for this visit.   Subjective Assessment - 06/10/21 1019     Subjective L shoulder bothers him when he first wakes but until he gets going. Feels a little sore. Might be sleeping on it. Does not stay in the  same position at night. L elbow still bothers him... might get an injection next MD appointment. 5/10 L shoulder currently. Was pretty good after last session. L shoulder just feels sore and a little stiff.    Pertinent History L shoulder pain. Pain began about 1 month ago. L shoulder and L elbow bothers him. Might have slept wrong. Pt is R hand dominant. Had an x-ray which revealed arthritis.    Patient Stated Goals Reach behind his back and head, and reach in general without pain.    Currently in Pain? Yes    Pain Score 5                                        PT Education - 06/10/21 1316     Education Details ther-ex    Person(s) Educated Patient    Methods Explanation;Demonstration;Tactile cues;Verbal cues    Comprehension Returned demonstration;Verbalized understanding            Objective     Blood pressure is controlled per pt. No latex allergies       Medbridge Access Code [email or web address]   Manual therapy  Seated A to P to L humeral head grade 3 to improve joint mobility   Seated STM L pectoralis major muscle to decrease tension     Seated STM L upper and lower trap area to decrease fascial restrictions  STM L upper trap area to decrease muscle tension   Seated STM L anterior and lateral shoulder to decrease fascial restrictions   Pt states L shoulder feels better afterwards       Therapeutic exercise    Seated manually resisted scapular retraction targeting lower trap muscles   L 10x3 with 5 second holds   Seated L upper trap stretch 10x5 seconds   L shoulder flexion multiple times to assess effectiveness of treatment.    Improved exercise technique, movement at target joints, use of target muscles after min to mod verbal, visual, tactile cues.        Response to treatment Pt tolerated session well without aggravation of symptoms. Decreased L shoulder pain to 4/10 after session .        Clinical impression Decreasing  starting pain levels observed compared to previous sessions. Continued working on improving joint mobility, decreasing soft tissue restrictions and improving scapular strength to promote better glenohumeral movement when raising his arm up. Decreased L shoulder pain to 4/10  reported after session. Pt will benefit from continued skilled physical therapy services to decrease pain, improve strength and function.        PT Short Term Goals - 04/27/21 1208       PT SHORT TERM GOAL #1   Title Pt will be independent with his initial HEP to decrease pain, improve L shoulder ROM, strength and function.    Baseline Pt has started his initial HEP (04/27/2021)    Time 3    Period Weeks    Status New    Target Date 05/20/21               PT Long Term Goals - 04/27/21 1210       PT LONG TERM GOAL #1   Title Pt will have a decrease in L shoulder pain to 4/10 or less at worst to promote ability to reach up, to the side, as well as behind his head and back more comfortably.    Baseline 10/10 L shoulder pain at most for the past 2 weeks. (04/27/2021)    Time 8    Period Weeks    Status New    Target Date 06/24/21      PT LONG TERM GOAL #2   Title Pt will improve L shoulder flexion and abduction AROM by at least 20 degrees to promote ability to reach with less difficulty.    Baseline L shoulder AROM: 65 degrees flexion, 72 degrees abduction (04/27/2021)    Time 8    Period Weeks    Status New    Target Date 06/24/21      PT LONG TERM GOAL #3   Title Pt will improve L shoulder strength by at least 1/2 MMT grade to promote ability to reach with less difficulty.    Baseline 4-/5 flexion, and abduction, 4/5 IR, 4+/5 ER, 4/5 lower trap (manually resisted scapular retraction targeting the muscle) 04/27/2021.    Time 8    Period Weeks    Status New    Target Date 06/24/21      PT LONG TERM GOAL #4   Title Pt will improve his FOTO score by at least 10 points as a demonstration of improved  function.    Baseline Pt did not complete his initial FOTO (04/27/2021)    Time 8    Period Weeks    Status New    Target Date 06/24/21                   Plan - 06/10/21 1316     Clinical Impression Statement Decreasing starting pain levels observed compared to previous sessions. Continued working on improving joint mobility, decreasing soft tissue restrictions and improving scapular strength to promote better glenohumeral movement when raising his arm up. Decreased L shoulder pain to 4/10  reported after session. Pt will benefit from continued skilled physical therapy services to decrease pain, improve strength and function.    Personal Factors and Comorbidities Comorbidity 3+;Age;Time since onset of injury/illness/exacerbation;Fitness;Past/Current Experience    Comorbidities Arthritis, cardiomyopathy, chronic back pain, HTN, obesity    Examination-Activity Limitations Lift;Reach Overhead;Carry    Stability/Clinical Decision Making Stable/Uncomplicated    Clinical Decision Making Low    Rehab Potential Fair    PT Frequency 2x / week    PT Duration 8 weeks    PT Treatment/Interventions Therapeutic activities;Therapeutic exercise;Neuromuscular re-education;Patient/family education;Manual techniques;Dry needling;Spinal Manipulations;Joint Manipulations;Electrical Stimulation;Iontophoresis 4mg /ml Dexamethasone    PT Next Visit Plan thoracic extension, scapular strengthening, posture, manual techniques, modalities PRN    PT Home Exercise Plan Medbridge Access Code [email or web address]    Consulted and Agree with Plan of Care Patient             Patient will benefit from skilled therapeutic intervention in order to improve the following deficits and impairments:  Improper body mechanics, Pain, Postural dysfunction, Decreased strength, Decreased range of motion  Visit Diagnosis: Acute pain of left shoulder  Muscle weakness (generalized)     Problem List Patient Active Problem List    Diagnosis Date Noted   Acute coronary syndrome (HCC) 11/28/2019   11/30/2019 PT, DPT   06/10/2021, 1:20 PM  [place] [facility] [address], [address], [place] Phone: [phone]   Fax:  [phone]  Name: Kearney Evitt MRN: Joanette Gula Date of Birth: 1951-10-31

## 2021-06-15 ENCOUNTER — Ambulatory Visit: Payer: Medicare Other

## 2021-06-15 DIAGNOSIS — M25512 Pain in left shoulder: Secondary | ICD-10-CM

## 2021-06-15 DIAGNOSIS — M6281 Muscle weakness (generalized): Secondary | ICD-10-CM

## 2021-06-15 NOTE — Therapy (Signed)
[place] [facility] [address] [place], [address] Phone: [phone]   Fax:  [phone]  Physical Therapy Treatment  Patient Details  Name: Ronald Horne MRN: [id number] Date of Birth: 08-08-1951 Referring Provider (PT): Dorthula Nettles, DO   Encounter Date: 06/15/2021   PT End of Session - 06/15/21 1027     Visit Number 7    Number of Visits 17    Date for PT Re-Evaluation 06/24/21    Authorization Type Medicare    Authorization Time Period Cert 8/24-2/35    Progress Note Due on Visit 10    PT Start Time 1020    PT Stop Time 1058    PT Time Calculation (min) 38 min    Activity Tolerance Patient tolerated treatment well;No increased pain;Patient limited by pain    Behavior During Therapy [facility] for tasks assessed/performed             Past Medical History:  Diagnosis Date   Arthritis    Cardiomyopathy (HCC)    a. 08/2016 Echo: EF 30-35%, diff HK; b. 03/2018 Ex MV (Jadali): Ex time 2:00, large anter defect, EF 24%; c. 03/2018 Echo: EF 20-25%, diff HK. Gr1 DD; d. Refused cath.   Chronic back pain    Chronic combined systolic (congestive) and diastolic (congestive) heart failure (HCC)    a. 08/2016 Echo: EF 30-35%, diff HK, mild to mod MR, mild BAE; b. 03/2018 Echo: EF 20-25%, diff HK. Gr1 DD. MIldly dil Ao root. Mild BAE. Mildly reduced RV fxn.   GERD (gastroesophageal reflux disease)    Gout    Hypertension    Obesity     Past Surgical History:  Procedure Laterality Date   EPIDURAL BLOCK INJECTION     chronic back pain    LEFT HEART CATH AND CORONARY ANGIOGRAPHY Left 12/27/2019   Procedure: LEFT HEART CATH AND CORONARY ANGIOGRAPHY;  Surgeon: Laurier Nancy, MD;  Location: [facility] INVASIVE CV LAB;  Service: Cardiovascular;  Laterality: Left;   TONSILLECTOMY      There were no vitals filed for this visit.   Subjective Assessment - 06/15/21 1022     Subjective Pt doing ok today, denies any medical  updates. Pt reports HEP stretches are being perfomred.    Pertinent History L shoulder pain. Pain began about 1 month ago. L shoulder and L elbow bothers him. Might have slept wrong. Pt is R hand dominant. Had an x-ray which revealed arthritis.    Patient Stated Goals Reach behind his back and head, and reach in general without pain.    Currently in Pain? No/denies   just stiffness at Left anterior shoulder and olecranon area            INTERVENTION THIS DATE:  -Nustep, level 0, SPM 28 (ad lib), seat 14, LUE 14, RUE 12.5 -TRIGGER POINT RELEASE  Left anterior deltoid (6 minutes)   -standing Lt shoulder abduction A/ROM 1x10 0-75 degrees  -seated LUE GHJ ER 4lbFW 1x10 -standing Left elbow flexion 5lbFW 1x12   Rest break seated  -standing Lt shoulder abduction A/ROM 1x10 0-75 degrees  -seated LUE GHJ ER 4lbFW 1x10 -standing Left elbow flexion 5lbFW 1x12   Rest break seated  -standing Left cable triceps extension (full ROM) 5lb 1x10  -standing cable row BUE 20lb 1x10   Rest break seated  -standing Left cable triceps extension (full ROM) 5lb 1x10  -standing cable row BUE 20lb 1x10     PT Education -  06/15/21 1026     Education Details using movement to abate stiffness    Person(s) Educated Patient    Methods Explanation    Comprehension Verbalized understanding              PT Short Term Goals - 04/27/21 1208       PT SHORT TERM GOAL #1   Title Pt will be independent with his initial HEP to decrease pain, improve L shoulder ROM, strength and function.    Baseline Pt has started his initial HEP (04/27/2021)    Time 3    Period Weeks    Status New    Target Date 05/20/21               PT Long Term Goals - 04/27/21 1210       PT LONG TERM GOAL #1   Title Pt will have a decrease in L shoulder pain to 4/10 or less at worst to promote ability to reach up, to the side, as well as behind his head and back more comfortably.    Baseline 10/10 L shoulder pain at  most for the past 2 weeks. (04/27/2021)    Time 8    Period Weeks    Status New    Target Date 06/24/21      PT LONG TERM GOAL #2   Title Pt will improve L shoulder flexion and abduction AROM by at least 20 degrees to promote ability to reach with less difficulty.    Baseline L shoulder AROM: 65 degrees flexion, 72 degrees abduction (04/27/2021)    Time 8    Period Weeks    Status New    Target Date 06/24/21      PT LONG TERM GOAL #3   Title Pt will improve L shoulder strength by at least 1/2 MMT grade to promote ability to reach with less difficulty.    Baseline 4-/5 flexion, and abduction, 4/5 IR, 4+/5 ER, 4/5 lower trap (manually resisted scapular retraction targeting the muscle) 04/27/2021.    Time 8    Period Weeks    Status New    Target Date 06/24/21      PT LONG TERM GOAL #4   Title Pt will improve his FOTO score by at least 10 points as a demonstration of improved function.    Baseline Pt did not complete his initial FOTO (04/27/2021)    Time 8    Period Weeks    Status New    Target Date 06/24/21                   Plan - 06/15/21 1029     Clinical Impression Statement Continued with manual therapies and therex to addres pain, weakness, stiffness in Left shoulder. Pt has improvement in symtpoms with all of them, Pt still very limited ROM and easily provoked pain with movement will benefit frm continued PT to restore to PLOF.    Personal Factors and Comorbidities Comorbidity 3+;Age;Time since onset of injury/illness/exacerbation;Fitness;Past/Current Experience    Comorbidities Arthritis, cardiomyopathy, chronic back pain, HTN, obesity             Patient will benefit from skilled therapeutic intervention in order to improve the following deficits and impairments:     Visit Diagnosis: Acute pain of left shoulder  Muscle weakness (generalized)     Problem List Patient Active Problem List   Diagnosis Date Noted   Acute coronary syndrome (HCC)  11/28/2019   10:55 AM, 06/15/21 Isaias Cowman  Belva Agee, PT, DPT Physical Therapist - [place] [phone] (Office)   Jovanka Westgate C 06/15/2021, 10:44 AM  [place] [facility] SPORTS MEDICINE [address], [place], [address] Phone: [phone]   Fax:  [phone]  Name: Ronald Horne MRN: [id number] Date of Birth: [birth year]

## 2021-06-17 ENCOUNTER — Ambulatory Visit: Payer: Medicare Other

## 2021-06-17 DIAGNOSIS — M25512 Pain in left shoulder: Secondary | ICD-10-CM | POA: Diagnosis not present

## 2021-06-17 DIAGNOSIS — M6281 Muscle weakness (generalized): Secondary | ICD-10-CM

## 2021-06-17 NOTE — Therapy (Signed)
[place] [facility] [address] [place], [address] Phone: [phone]   Fax:  [phone]  Physical Therapy Treatment  Patient Details  Name: Ronald Horne MRN: [id number] Date of Birth: [birth year] Referring Provider (PT): Dorthula Nettles, DO   Encounter Date: 06/17/2021   PT End of Session - 06/17/21 1016     Visit Number 8    Number of Visits 17    Date for PT Re-Evaluation 06/24/21    Authorization Type Medicare    Authorization Time Period Cert 1/60-7/37    Progress Note Due on Visit 10    PT Start Time 1016    PT Stop Time 1058    PT Time Calculation (min) 42 min    Activity Tolerance Patient tolerated treatment well;No increased pain;Patient limited by pain    Behavior During Therapy [facility] for tasks assessed/performed             Past Medical History:  Diagnosis Date   Arthritis    Cardiomyopathy (HCC)    a. 08/2016 Echo: EF 30-35%, diff HK; b. 03/2018 Ex MV (Jadali): Ex time 2:00, large anter defect, EF 24%; c. 03/2018 Echo: EF 20-25%, diff HK. Gr1 DD; d. Refused cath.   Chronic back pain    Chronic combined systolic (congestive) and diastolic (congestive) heart failure (HCC)    a. 08/2016 Echo: EF 30-35%, diff HK, mild to mod MR, mild BAE; b. 03/2018 Echo: EF 20-25%, diff HK. Gr1 DD. MIldly dil Ao root. Mild BAE. Mildly reduced RV fxn.   GERD (gastroesophageal reflux disease)    Gout    Hypertension    Obesity     Past Surgical History:  Procedure Laterality Date   EPIDURAL BLOCK INJECTION     chronic back pain    LEFT HEART CATH AND CORONARY ANGIOGRAPHY Left 12/27/2019   Procedure: LEFT HEART CATH AND CORONARY ANGIOGRAPHY;  Surgeon: Laurier Nancy, MD;  Location: [facility] INVASIVE CV LAB;  Service: Cardiovascular;  Laterality: Left;   TONSILLECTOMY      There were no vitals filed for this visit.   Subjective Assessment - 06/17/21 1018     Subjective L shoulder is a little tight. Mild pain.  5/10 L shoulder pain at most for the past 7 days.    Pertinent History L shoulder pain. Pain began about 1 month ago. L shoulder and L elbow bothers him. Might have slept wrong. Pt is R hand dominant. Had an x-ray which revealed arthritis.    Patient Stated Goals Reach behind his back and head, and reach in general without pain.    Currently in Pain? Yes    Pain Score 6     Pain Orientation Left;Anterior                                       PT Education - 06/17/21 1029     Education Details ther-ex, HEP    Person(s) Educated Patient    Methods Explanation;Demonstration;Tactile cues;Verbal cues;Handout    Comprehension Returned demonstration;Verbalized understanding            Objective     Blood pressure is controlled per pt. No latex allergies       Medbridge Access Code [email or web address]   Manual therapy    Seated STM L pectoralis major muscle to decrease tension   Seated A to P to L humeral head  grade 3 to improve joint mobility    Decreased L shoulder tension. Improved L shoulder comfort with flexion afterwards            Therapeutic exercise   Seated manually resisted L shoulder extension with PT manual resistance. 10x3  Standing L shoulder extension blue band 10x3  Standing L elbow extension blue band 10x3  Decreased L shoulder pain with flexion AROM afterwards  Seated manually resisted scapular retraction targeting lower trap muscles              L 10x3 with 5 second holds    Seated L upper trap stretch 10x10 seconds    L shoulder ER yellow band standing 10x2  L shoulder AROM: 98 degrees after aforementioned exercises. L shoulder discomfort.   L shoulder flexion multiple times to assess effectiveness of treatment.     Improved exercise technique, movement at target joints, use of target muscles after min to mod verbal, visual, tactile cues.        Response to treatment Pt tolerated session well without aggravation of  symptoms.      Clinical impression Decreased L shoulder pain level at worst to 5/10 for the past 7 days based on subjective reports. Continued working on improving posterior and ER shoulder and scapular strength to promote better glenohumeral mechanics with shoulder AROM. Pt will benefit from continued skilled physical therapy services to decrease pain, improve strength and function.      PT Short Term Goals - 04/27/21 1208       PT SHORT TERM GOAL #1   Title Pt will be independent with his initial HEP to decrease pain, improve L shoulder ROM, strength and function.    Baseline Pt has started his initial HEP (04/27/2021)    Time 3    Period Weeks    Status New    Target Date 05/20/21               PT Long Term Goals - 04/27/21 1210       PT LONG TERM GOAL #1   Title Pt will have a decrease in L shoulder pain to 4/10 or less at worst to promote ability to reach up, to the side, as well as behind his head and back more comfortably.    Baseline 10/10 L shoulder pain at most for the past 2 weeks. (04/27/2021)    Time 8    Period Weeks    Status New    Target Date 06/24/21      PT LONG TERM GOAL #2   Title Pt will improve L shoulder flexion and abduction AROM by at least 20 degrees to promote ability to reach with less difficulty.    Baseline L shoulder AROM: 65 degrees flexion, 72 degrees abduction (04/27/2021)    Time 8    Period Weeks    Status New    Target Date 06/24/21      PT LONG TERM GOAL #3   Title Pt will improve L shoulder strength by at least 1/2 MMT grade to promote ability to reach with less difficulty.    Baseline 4-/5 flexion, and abduction, 4/5 IR, 4+/5 ER, 4/5 lower trap (manually resisted scapular retraction targeting the muscle) 04/27/2021.    Time 8    Period Weeks    Status New    Target Date 06/24/21      PT LONG TERM GOAL #4   Title Pt will improve his FOTO score by at least 10 points as  a demonstration of improved function.    Baseline Pt did  not complete his initial FOTO (04/27/2021)    Time 8    Period Weeks    Status New    Target Date 06/24/21                   Plan - 06/17/21 1309     Clinical Impression Statement Decreased L shoulder pain level at worst to 5/10 for the past 7 days based on subjective reports. Continued working on improving posterior and ER shoulder and scapular strength to promote better glenohumeral mechanics with shoulder AROM. Pt will benefit from continued skilled physical therapy services to decrease pain, improve strength and function.    Personal Factors and Comorbidities Comorbidity 3+;Age;Time since onset of injury/illness/exacerbation;Fitness;Past/Current Experience    Comorbidities Arthritis, cardiomyopathy, chronic back pain, HTN, obesity    Examination-Activity Limitations Lift;Reach Overhead;Carry    Stability/Clinical Decision Making Stable/Uncomplicated    Clinical Decision Making Low    Rehab Potential Fair    PT Frequency 2x / week    PT Duration 8 weeks    PT Treatment/Interventions Therapeutic activities;Therapeutic exercise;Neuromuscular re-education;Patient/family education;Manual techniques;Dry needling;Spinal Manipulations;Joint Manipulations;Electrical Stimulation;Iontophoresis 4mg /ml Dexamethasone    PT Next Visit Plan thoracic extension, scapular strengthening, posture, manual techniques, modalities PRN    PT Home Exercise Plan Medbridge Access Code [email or web address]    Consulted and Agree with Plan of Care Patient             Patient will benefit from skilled therapeutic intervention in order to improve the following deficits and impairments:  Improper body mechanics, Pain, Postural dysfunction, Decreased strength, Decreased range of motion  Visit Diagnosis: Acute pain of left shoulder  Muscle weakness (generalized)     Problem List Patient Active Problem List   Diagnosis Date Noted   Acute coronary syndrome (HCC) 11/28/2019    11/30/2019 PT,  DPT   06/17/2021, 1:09 PM  [place] [facility]INE [address], [place] Phone: [phone]   Fax:  [phone]  Name: Ellijah Leffel MRN: Joanette Gula Date of Birth: 19-Jan-1951

## 2021-06-17 NOTE — Patient Instructions (Signed)
Access Code: [email or web address] URL: [email or web address] Date: 06/17/2021 Prepared by: Loralyn Freshwater  Exercises Seated Scapular Retraction - 1 x daily - 7 x weekly - 3 sets - 10 reps - 5 seconds hold Scapular Retraction with Resistance - 1 x daily - 7 x weekly - 3 sets - 10 reps - 5 seconds hold Seated Shoulder Inferior Glide - 1 x daily - 7 x weekly - 3 sets - 10 reps - 5 seconds hold Isometric Shoulder External Rotation - 1 x daily - 7 x weekly - 3 sets - 10 reps - 5 seconds hold Single Arm Shoulder Extension with Anchored Resistance - 1 x daily - 7 x weekly - 3 sets - 10 reps

## 2021-06-22 ENCOUNTER — Ambulatory Visit: Payer: Medicare Other

## 2021-06-24 ENCOUNTER — Other Ambulatory Visit: Payer: Self-pay

## 2021-06-24 ENCOUNTER — Ambulatory Visit: Payer: Medicare Other

## 2021-06-24 DIAGNOSIS — M6281 Muscle weakness (generalized): Secondary | ICD-10-CM

## 2021-06-24 DIAGNOSIS — M25512 Pain in left shoulder: Secondary | ICD-10-CM

## 2021-06-24 NOTE — Therapy (Signed)
[facility] [address], [place], [address] Phone: [phone]   Fax:  [phone]  Physical Therapy Treatment  Patient Details  Name: Ronald Horne MRN: [id number] Date of Birth: 1951/07/28 Referring Provider (PT): Dorthula Nettles, DO   Encounter Date: 06/24/2021   PT End of Session - 06/24/21 1033     Visit Number 9    Number of Visits 17    Date for PT Re-Evaluation 06/24/21    Authorization Type Medicare    Authorization Time Period Cert 8/11-5/72    Progress Note Due on Visit 10    PT Start Time 1017    PT Stop Time 1059    PT Time Calculation (min) 42 min    Activity Tolerance Patient tolerated treatment well;No increased pain;Patient limited by pain    Behavior During Therapy [facility] for tasks assessed/performed             Past Medical History:  Diagnosis Date   Arthritis    Cardiomyopathy (HCC)    a. 08/2016 Echo: EF 30-35%, diff HK; b. 03/2018 Ex MV (Jadali): Ex time 2:00, large anter defect, EF 24%; c. 03/2018 Echo: EF 20-25%, diff HK. Gr1 DD; d. Refused cath.   Chronic back pain    Chronic combined systolic (congestive) and diastolic (congestive) heart failure (HCC)    a. 08/2016 Echo: EF 30-35%, diff HK, mild to mod MR, mild BAE; b. 03/2018 Echo: EF 20-25%, diff HK. Gr1 DD. MIldly dil Ao root. Mild BAE. Mildly reduced RV fxn.   GERD (gastroesophageal reflux disease)    Gout    Hypertension    Obesity     Past Surgical History:  Procedure Laterality Date   EPIDURAL BLOCK INJECTION     chronic back pain    LEFT HEART CATH AND CORONARY ANGIOGRAPHY Left 12/27/2019   Procedure: LEFT HEART CATH AND CORONARY ANGIOGRAPHY;  Surgeon: Laurier Nancy, MD;  Location: [facility] INVASIVE CV LAB;  Service: Cardiovascular;  Laterality: Left;   TONSILLECTOMY      There were no vitals filed for this visit.   Subjective Assessment - 06/24/21 1019     Subjective Pt reports L shoulder pain at 5-6/10 NPS.  Reports pain reduces during PT but increases when he gets back home.    Pertinent History L shoulder pain. Pain began about 1 month ago. L shoulder and L elbow bothers him. Might have slept wrong. Pt is R hand dominant. Had an x-ray which revealed arthritis.    Patient Stated Goals Reach behind his back and head, and reach in general without pain.    Currently in Pain? Yes    Pain Score 6     Pain Location Shoulder    Pain Orientation Left;Anterior    Pain Descriptors / Indicators Tightness    Pain Type Acute pain            There.ex:  Standing OMEGA low row: 20 lbs, 2x10  Standing scap retractions: blue TB, 2x10 Seated L shoulder ER with GTB: 2x10, VC's for form/technique with exercise.  Seated L shoulder AAROM PVC pipe shoulder flexion: 2x10. Second set with tactile cuing and scap assist in protraction, upward rotation and post tipping to improve scapulothoracic rhythm and overhead motion Seated L shoulder Serratus ant punch with scap assist TC' ing on scapula for protraction to assist in shoulder elevation : 2x10    Manual Therapy: 10 min total  STM in seated to L ant delt and  pec major to decrease concordant trigger point pain. 8 min.   Seated AP GHJ mobs grade III to improve shoulder flexion mobility,2x8, 15 sec holds.    PT Short Term Goals - 04/27/21 1208       PT SHORT TERM GOAL #1   Title Pt will be independent with his initial HEP to decrease pain, improve L shoulder ROM, strength and function.    Baseline Pt has started his initial HEP (04/27/2021)    Time 3    Period Weeks    Status New    Target Date 05/20/21               PT Long Term Goals - 04/27/21 1210       PT LONG TERM GOAL #1   Title Pt will have a decrease in L shoulder pain to 4/10 or less at worst to promote ability to reach up, to the side, as well as behind his head and back more comfortably.    Baseline 10/10 L shoulder pain at most for the past 2 weeks. (04/27/2021)    Time 8    Period  Weeks    Status New    Target Date 06/24/21      PT LONG TERM GOAL #2   Title Pt will improve L shoulder flexion and abduction AROM by at least 20 degrees to promote ability to reach with less difficulty.    Baseline L shoulder AROM: 65 degrees flexion, 72 degrees abduction (04/27/2021)    Time 8    Period Weeks    Status New    Target Date 06/24/21      PT LONG TERM GOAL #3   Title Pt will improve L shoulder strength by at least 1/2 MMT grade to promote ability to reach with less difficulty.    Baseline 4-/5 flexion, and abduction, 4/5 IR, 4+/5 ER, 4/5 lower trap (manually resisted scapular retraction targeting the muscle) 04/27/2021.    Time 8    Period Weeks    Status New    Target Date 06/24/21      PT LONG TERM GOAL #4   Title Pt will improve his FOTO score by at least 10 points as a demonstration of improved function.    Baseline Pt did not complete his initial FOTO (04/27/2021)    Time 8    Period Weeks    Status New    Target Date 06/24/21                   Plan - 06/24/21 1110     Clinical Impression Statement Continuing primary PT POC with focus on L shoulder mobility, strength and pain relief. Post session pain eports from 6/10 NPS to 5/10 NPS but reports significant reduction in shoulder stiffness. Pt presents with very limited scapular protraction and upward rotation  with overhead motions. Pt presents with improved pain and overhead motion with scapular assist with upward rotation and protraction. Pt will continue to benefit from skilled PT services to decrease pain and further improve strength and function with overhead mobility.    Personal Factors and Comorbidities Comorbidity 3+;Age;Time since onset of injury/illness/exacerbation;Fitness;Past/Current Experience    Comorbidities Arthritis, cardiomyopathy, chronic back pain, HTN, obesity    Examination-Activity Limitations Lift;Reach Overhead;Carry    Stability/Clinical Decision Making Stable/Uncomplicated     Clinical Decision Making Low    Rehab Potential Fair    PT Frequency 2x / week    PT Duration 8 weeks    PT  Treatment/Interventions Therapeutic activities;Therapeutic exercise;Neuromuscular re-education;Patient/family education;Manual techniques;Dry needling;Spinal Manipulations;Joint Manipulations;Electrical Stimulation;Iontophoresis 4mg /ml Dexamethasone    PT Next Visit Plan thoracic extension, scapular strengthening, posture, manual techniques, modalities PRN    PT Home Exercise Plan Medbridge Access Code [email or web address]    Consulted and Agree with Plan of Care Patient             Patient will benefit from skilled therapeutic intervention in order to improve the following deficits and impairments:  Improper body mechanics, Pain, Postural dysfunction, Decreased strength, Decreased range of motion  Visit Diagnosis: Acute pain of left shoulder  Muscle weakness (generalized)     Problem List Patient Active Problem List   Diagnosis Date Noted   Acute coronary syndrome (HCC) 11/28/2019    11/30/2019. Fairly IV, PT, DPT Physical Therapist- [place]  [facility]   06/24/2021, 11:15 AM  [place] [facility] PHYSICAL AND SPORTS MEDICINE [address], [address], [place] Phone: [phone]   Fax:  [phone]  Name: Ronald Horne MRN: Joanette Gula Date of Birth: 01-Feb-1951

## 2021-06-29 ENCOUNTER — Ambulatory Visit: Payer: Medicare Other

## 2021-06-29 DIAGNOSIS — M25512 Pain in left shoulder: Secondary | ICD-10-CM

## 2021-06-29 DIAGNOSIS — M6281 Muscle weakness (generalized): Secondary | ICD-10-CM

## 2021-06-29 NOTE — Therapy (Signed)
[facility] [address] [place], [address] Phone: [phone]   Fax:  [phone]  Physical Therapy ReEvaluation/Recertification  Reporting Period: 04/27/21-06/29/21  Patient Details  Name: Ronald Horne MRN: [id number] Date of Birth: January 21, 1951 Referring Provider (PT): Rosalia Hammers, DO   Encounter Date: 06/29/2021   PT End of Session - 06/29/21 1026     Visit Number 10    Number of Visits 17    Date for PT Re-Evaluation 07/27/21    Authorization Type Medicare    Authorization Time Period Cert 3/41-9/37; recert 07/03/39-9/73/53    Progress Note Due on Visit 20    PT Start Time 1018    PT Stop Time 1052   Pt asking to leave early, says he's normal here for 25-30 minutes   PT Time Calculation (min) 34 min    Activity Tolerance Patient tolerated treatment well;Patient limited by pain    Behavior During Therapy [facility] for tasks assessed/performed             Past Medical History:  Diagnosis Date   Arthritis    Cardiomyopathy ([place])    a. 08/2016 Echo: EF 30-35%, diff HK; b. 03/2018 Ex MV Rosario Jacks): Ex time 2:00, large anter defect, EF 24%; c. 03/2018 Echo: EF 20-25%, diff HK. Gr1 DD; d. Refused cath.   Chronic back pain    Chronic combined systolic (congestive) and diastolic (congestive) heart failure ([place])    a. 08/2016 Echo: EF 30-35%, diff HK, mild to mod MR, mild BAE; b. 03/2018 Echo: EF 20-25%, diff HK. Gr1 DD. MIldly dil Ao root. Mild BAE. Mildly reduced RV fxn.   GERD (gastroesophageal reflux disease)    Gout    Hypertension    Obesity     Past Surgical History:  Procedure Laterality Date   EPIDURAL BLOCK INJECTION     chronic back pain    LEFT HEART CATH AND CORONARY ANGIOGRAPHY Left 12/27/2019   Procedure: LEFT HEART CATH AND CORONARY ANGIOGRAPHY;  Surgeon: Dionisio David, MD;  Location: [place];  Service: Cardiovascular;  Laterality: Left;   TONSILLECTOMY      There were no  vitals filed for this visit.    Subjective Assessment - 06/29/21 1024     Subjective Pt reports he is due back to see Dr. Candelaria Stagers again for FU. He says since beginning PT, he has had brief periods of improved stiffness but overall no significant change.    Pertinent History L shoulder pain. Pain began about 1 month ago. L shoulder and L elbow bothers him. Pt is R hand dominant. Seen by Marvis Moeller at [facility]. Had an x-ray which revealed arthritis.    Patient Stated Goals Reach behind his back and head, and reach in general without pain.    Currently in Pain? Yes    Pain Score 5     Pain Location --   Left shoulder               [facility] PT Assessment - 06/29/21 0001       Assessment   Medical Diagnosis Acute pain of L shoulder; L shoulder OA, impingement, enthesopathy    Referring Provider (PT) Rosalia Hammers, DO    Onset Date/Surgical Date 04/14/21    Hand Dominance Right    Prior Therapy No known PT for current condition      Balance Screen   Has the patient fallen in the past 6 months No  Has the patient had a decrease in activity level because of a fear of falling?  No    Is the patient reluctant to leave their home because of a fear of falling?  No      Prior Function   Vocation Retired;On disability    Leisure spends time with his dog      Observation/Other Assessments   Focus on Therapeutic Outcomes (FOTO)  Not completed at eval      AROM   Right/Left Shoulder Right;Left    Right Shoulder Flexion 152 Degrees    Right Shoulder ABduction 139 Degrees    Right Shoulder External Rotation 75 Degrees   WNL   Left Shoulder Flexion 119 Degrees    pain and scaption deviation (65 at eval)   Left Shoulder ABduction 89 Degrees    72 at eval   Left Shoulder External Rotation 55 Degrees    pain at end range (tested at 60 degrees abduction due to pain/stiffness)     Strength   Right/Left Shoulder Right;Left    Right Shoulder Flexion 4+/5    Right  Shoulder ABduction 5/5    Right Shoulder Internal Rotation 5/5    Right Shoulder External Rotation 5/5    Left Shoulder Flexion 4-/5    Left Shoulder ABduction 4+/5    Left Shoulder Internal Rotation 4+/5    Left Shoulder External Rotation 4+/5    Right/Left Elbow Right;Left             Intervention:  -Left-sided AAROM using NuStep: Seat 14, right arm 14, left arm 12-1/2, 7 minutes level 1 -Left GHJ lateral glide traction in neutral arm position using axillary towel: 3 x 30 seconds, extensive cues for patient to relax left arm   Objective measurements completed on examination: See above findings.        PT Education - 06/29/21 1026     Education Details findings of reassessment.  Anticipate only minimal additional improvements in range of motion and daily stiffness however patient could benefit from additional strengthening.    Person(s) Educated Patient    Methods Demonstration    Comprehension Verbalized understanding              PT Short Term Goals - 06/29/21 1049       PT SHORT TERM GOAL #1   Title Pt will be independent with his initial HEP to decrease pain, improve L shoulder ROM, strength and function.    Baseline Pt has started his initial HEP (04/27/2021)    Time 3    Period Weeks    Status Achieved    Target Date 05/20/21               PT Long Term Goals - 06/29/21 1053       PT LONG TERM GOAL #1   Title Pt will have a decrease in L shoulder pain to 4/10 or less at worst to promote ability to reach up, to the side, as well as behind his head and back more comfortably.    Baseline 10/10 L shoulder pain at most for the past 2 weeks. (04/27/2021); 06/29/21 Pain unchanged in intensity    Time 8    Period Weeks    Status Not Met    Target Date 06/24/21      PT LONG TERM GOAL #2   Title Pt will improve L shoulder flexion and abduction AROM by at least 20 degrees to promote ability to reach with less difficulty.  Baseline L shoulder AROM: 65  degrees flexion, 72 degrees abduction (04/27/2021); 8/30: Flexion met, Abduction not met.    Time 8    Period Weeks    Status Partially Met    Target Date 06/24/21      PT LONG TERM GOAL #3   Title Pt will improve L shoulder strength by at least 1/2 MMT grade to promote ability to reach with less difficulty.    Baseline 4-/5 flexion, and abduction, 4/5 IR, 4+/5 ER, 4/5 lower trap (manually resisted scapular retraction targeting the muscle) 04/27/2021; 8/30: strength largely unchanged    Time 8    Period Weeks    Status Not Met    Target Date 06/24/21      PT LONG TERM GOAL #4   Title Pt will improve his FOTO score by at least 10 points as a demonstration of improved function.    Baseline Pt did not complete his initial FOTO (04/27/2021); 06/29/21: FOTO never completed.    Time 8    Period Weeks    Status Not Met    Target Date 06/24/21      PT LONG TERM GOAL #5   Title Pt to demonstrate independence in advanced HEP for moderate resistance loading (reps10-15 to failure) for shoulder flexion, resisted row, shoulder ER, and shoulder abduction to reduce pain sensitivity and help improve return to baseline actiivties currentl ylimited by pain (pulling trash to curb, putting pots/pans overhead).     Baseline 06/29/21: HEP not issued yet, actiivtes not being performed wiht LUE    Time 4    Period Weeks    Status New    Target Date 07/27/21      Additional Long Term Goals   Additional Long Term Goals Yes      PT LONG TERM GOAL #6   Title Pt to demonstrate ability to caryy 10lb load each UE and carry x240f to demonstrate improved capcity to perform grocery shopping.     Baseline 8/30: limited by pain.    Time 4    Period Weeks    Status New    Target Date 07/27/21      PT LONG TERM GOAL #7   Title Pt to demonstrate modified technique for don/doff T-shirt to reduce aggravation of limited ROM in Lt shoulder.     Baseline 06/29/21: Pain with donning shirts    Time 4    Period Weeks     Status New    Target Date 07/27/21                    Plan - 06/29/21 1028     Clinical Impression Statement Reassessment this date, pt has finished his initital certiifcation period with insurance.  Reassessment on this date revealing improvements in range of motion as well as strength testing, however patient has not experienced a significant capacity to return to several activities of IADL.  Patient continues to complain of daily stiffness, however he reports sufficient ability to correct stiffness with the HEP he has been given.  Patient would like to continue on with physical therapy to achieve additional improvements.  Long-term goals have been updated to reflect biggest priorities prior to discharge, and the ability to return to maintaining his home.  We will plan on recertification for 4 weeks and then discharge at that time.    Personal Factors and Comorbidities Comorbidity 3+;Age;Time since onset of injury/illness/exacerbation;Fitness;Past/Current Experience    Comorbidities Arthritis, cardiomyopathy, chronic back pain, HTN, obesity  Examination-Activity Limitations Lift;Reach Overhead;Carry    Stability/Clinical Decision Making Stable/Uncomplicated    Clinical Decision Making Low    Rehab Potential Fair    PT Frequency 2x / week    PT Duration 4 weeks    PT Treatment/Interventions Therapeutic activities;Therapeutic exercise;Neuromuscular re-education;Patient/family education;Manual techniques;Dry needling;Spinal Manipulations;Joint Manipulations;Electrical Stimulation;Passive range of motion;ADLs/Self Care Home Management;Moist Heat    PT Next Visit Plan Again developed updated HEP to include moderate resistance strength of shoulders (see long-term goals for detail)    PT Home Exercise Plan Medbridge Access Code [email or web address]    Consulted and Agree with Plan of Care Patient             Patient will benefit from skilled therapeutic intervention in order to improve the  following deficits and impairments:  Improper body mechanics, Pain, Postural dysfunction, Decreased strength, Decreased range of motion  Visit Diagnosis: Acute pain of left shoulder  Muscle weakness (generalized)     Problem List Patient Active Problem List   Diagnosis Date Noted   Acute coronary syndrome ([place]) 11/28/2019   Note: Portions of this document were prepared using Dragon voice recognition software and although reviewed may contain unintentional dictation errors in sintax, grammer, or Spelling.   1:42 PM, 06/29/21 Etta Grandchild, PT, DPT Physical Therapist - [facility] [phone] (Office)    Novella Abraha C 06/29/2021, 1:40 PM  [facility] [address] [place], [address] Phone: [phone]   Fax:  [phone]  Name: Christiaan Strebeck MRN: [id number] Date of Birth: June 30, 1951

## 2021-07-07 ENCOUNTER — Ambulatory Visit: Payer: Medicare Other | Attending: Sports Medicine

## 2021-07-07 DIAGNOSIS — M25512 Pain in left shoulder: Secondary | ICD-10-CM | POA: Insufficient documentation

## 2021-07-07 DIAGNOSIS — M6281 Muscle weakness (generalized): Secondary | ICD-10-CM | POA: Diagnosis present

## 2021-07-07 NOTE — Therapy (Signed)
[facility] [address] [place], [address] Phone: [phone]   Fax:  [phone]  Physical Therapy Treatment  Patient Details  Name: Ronald Horne MRN: [id number] Date of Birth: Apr 29, 1951 Referring Provider (PT): Rosalia Hammers, DO   Encounter Date: 07/07/2021   PT End of Session - 07/07/21 0847     Visit Number 11    Number of Visits 17    Date for PT Re-Evaluation 07/27/21    Authorization Type Medicare    Authorization Time Period Cert 3/30-0/76; recert 12/27/31-3/54/56    Progress Note Due on Visit 20    PT Start Time 0847    PT Stop Time 0931    PT Time Calculation (min) 44 min    Activity Tolerance Patient tolerated treatment well    Behavior During Therapy [facility] for tasks assessed/performed             Past Medical History:  Diagnosis Date   Arthritis    Cardiomyopathy ([place])    a. 08/2016 Echo: EF 30-35%, diff HK; b. 03/2018 Ex MV Rosario Jacks): Ex time 2:00, large anter defect, EF 24%; c. 03/2018 Echo: EF 20-25%, diff HK. Gr1 DD; d. Refused cath.   Chronic back pain    Chronic combined systolic (congestive) and diastolic (congestive) heart failure ([place])    a. 08/2016 Echo: EF 30-35%, diff HK, mild to mod MR, mild BAE; b. 03/2018 Echo: EF 20-25%, diff HK. Gr1 DD. MIldly dil Ao root. Mild BAE. Mildly reduced RV fxn.   GERD (gastroesophageal reflux disease)    Gout    Hypertension    Obesity     Past Surgical History:  Procedure Laterality Date   EPIDURAL BLOCK INJECTION     chronic back pain    LEFT HEART CATH AND CORONARY ANGIOGRAPHY Left 12/27/2019   Procedure: LEFT HEART CATH AND CORONARY ANGIOGRAPHY;  Surgeon: Dionisio David, MD;  Location: [facility];  Service: Cardiovascular;  Laterality: Left;   TONSILLECTOMY      There were no vitals filed for this visit.   Subjective Assessment - 07/07/21 0848     Subjective The manual therapy from last session bothered his L  shoulder. L shoulder feels tight today. 5/10 L shoulder pain with flexion. 7/10 at most for the past 7 days (when pt first wakes up).    Pertinent History L shoulder pain. Pain began about 1 month ago. L shoulder and L elbow bothers him. Pt is R hand dominant. Seen by Marvis Moeller at [facility]. Had an x-ray which revealed arthritis.    Patient Stated Goals Reach behind his back and head, and reach in general without pain.    Currently in Pain? Yes    Pain Score 5                                      Objective     Blood pressure is controlled per pt. No latex allergies        Medbridge Access Code [email or web address]   Manual therapy    Seated A to P to L humeral head grade 3 to improve joint mobility   Seated inferior L glenohumeral joint glide/stretch   Seated STM L pectoralis major muscle to decrease tension           Therapeutic exercise Seated L shoulder self inferior glide 10x5 seconds for  2 sets  Improved comfort with shoulder flexion afterwards  Reinforced importance of performing this HEP secondary to pt reporting not doing this.   Standing rows blue band 10x5 seconds for 2 sets   Standing B shoulder extension blue band with scapular retraction 10x5 seconds for 2 sets   Seated L shoulder flexion AROM, mirror visual cues for scapular positioning  5x2  Standing B shoulder ER yellow band 10x2   Improved exercise technique, movement at target joints, use of target muscles after min to mod verbal, visual, tactile cues.        Response to treatment Pt tolerated session well without aggravation of symptoms. Decreased pain with L shoulder flexion AROM after session.      Clinical impression Improved ability to perform L shoulder flexion AROM more comfortably after scapular and ER strengthening exercises and treatment to promote better glenohumeral joint mobility. Improved ability to perform shoulder flexion more comfortably after  session. Pt will benefit from continued skilled physical therapy services to decrease pain, improve strength and function.           PT Education - 07/07/21 0853     Education Details ther-ex    Person(s) Educated Patient    Methods Explanation;Demonstration;Tactile cues;Verbal cues    Comprehension Returned demonstration;Verbalized understanding              PT Short Term Goals - 06/29/21 1049       PT SHORT TERM GOAL #1   Title Pt will be independent with his initial HEP to decrease pain, improve L shoulder ROM, strength and function.    Baseline Pt has started his initial HEP (04/27/2021)    Time 3    Period Weeks    Status Achieved    Target Date 05/20/21               PT Long Term Goals - 06/29/21 1053       PT LONG TERM GOAL #1   Title Pt will have a decrease in L shoulder pain to 4/10 or less at worst to promote ability to reach up, to the side, as well as behind his head and back more comfortably.    Baseline 10/10 L shoulder pain at most for the past 2 weeks. (04/27/2021); 06/29/21 Pain unchanged in intensity    Time 8    Period Weeks    Status Not Met    Target Date 06/24/21      PT LONG TERM GOAL #2   Title Pt will improve L shoulder flexion and abduction AROM by at least 20 degrees to promote ability to reach with less difficulty.    Baseline L shoulder AROM: 65 degrees flexion, 72 degrees abduction (04/27/2021); 8/30: Flexion met, Abduction not met.    Time 8    Period Weeks    Status Partially Met    Target Date 06/24/21      PT LONG TERM GOAL #3   Title Pt will improve L shoulder strength by at least 1/2 MMT grade to promote ability to reach with less difficulty.    Baseline 4-/5 flexion, and abduction, 4/5 IR, 4+/5 ER, 4/5 lower trap (manually resisted scapular retraction targeting the muscle) 04/27/2021; 8/30: strength largely unchanged    Time 8    Period Weeks    Status Not Met    Target Date 06/24/21      PT LONG TERM GOAL #4   Title  Pt will improve his FOTO score by at least 10  points as a demonstration of improved function.    Baseline Pt did not complete his initial FOTO (04/27/2021); 06/29/21: FOTO never completed.    Time 8    Period Weeks    Status Not Met    Target Date 06/24/21      PT LONG TERM GOAL #5   Title Pt to demonstrate independence in advanced HEP for moderate resistance loading (reps10-15 to failure) for shoulder flexion, resisted row, shoulder ER, and shoulder abduction to reduce pain sensitivity and help improve return to baseline actiivties currentl ylimited by pain (pulling trash to curb, putting pots/pans overhead).     Baseline 06/29/21: HEP not issued yet, actiivtes not being performed wiht LUE    Time 4    Period Weeks    Status New    Target Date 07/27/21      Additional Long Term Goals   Additional Long Term Goals Yes      PT LONG TERM GOAL #6   Title Pt to demonstrate ability to caryy 10lb load each UE and carry x250f to demonstrate improved capcity to perform grocery shopping.     Baseline 8/30: limited by pain.    Time 4    Period Weeks    Status New    Target Date 07/27/21      PT LONG TERM GOAL #7   Title Pt to demonstrate modified technique for don/doff T-shirt to reduce aggravation of limited ROM in Lt shoulder.     Baseline 06/29/21: Pain with donning shirts    Time 4    Period Weeks    Status New    Target Date 07/27/21                   Plan - 07/07/21 0847     Clinical Impression Statement Improved ability to perform L shoulder flexion AROM more comfortably after scapular and ER strengthening exercises and treatment to promote better glenohumeral joint mobility. Improved ability to perform shoulder flexion more comfortably after session. Pt will benefit from continued skilled physical therapy services to decrease pain, improve strength and function.    Personal Factors and Comorbidities Comorbidity 3+;Age;Time since onset of  injury/illness/exacerbation;Fitness;Past/Current Experience    Comorbidities Arthritis, cardiomyopathy, chronic back pain, HTN, obesity    Examination-Activity Limitations Lift;Reach Overhead;Carry    Stability/Clinical Decision Making Stable/Uncomplicated    Clinical Decision Making Low    Rehab Potential Fair    PT Frequency 2x / week    PT Duration 4 weeks    PT Treatment/Interventions Therapeutic activities;Therapeutic exercise;Neuromuscular re-education;Patient/family education;Manual techniques;Dry needling;Spinal Manipulations;Joint Manipulations;Electrical Stimulation;Passive range of motion;ADLs/Self Care Home Management;Moist Heat    PT Next Visit Plan Again developed updated HEP to include moderate resistance strength of shoulders (see long-term goals for detail)    PT Home Exercise Plan Medbridge Access Code [email or web address]    Consulted and Agree with Plan of Care Patient             Patient will benefit from skilled therapeutic intervention in order to improve the following deficits and impairments:  Improper body mechanics, Pain, Postural dysfunction, Decreased strength, Decreased range of motion  Visit Diagnosis: Acute pain of left shoulder  Muscle weakness (generalized)     Problem List Patient Active Problem List   Diagnosis Date Noted   Acute coronary syndrome ([place] 11/28/2019    MJoneen BoersPT, DPT  07/07/2021, 10:42 AM  [facility] [address] [place] [address]  Phone: [phone]   Fax:  [phone]  Name: Ronald Horne MRN: [id number] Date of Birth: [birth year]

## 2021-07-12 ENCOUNTER — Ambulatory Visit: Payer: Medicare Other

## 2021-07-12 DIAGNOSIS — M25512 Pain in left shoulder: Secondary | ICD-10-CM

## 2021-07-12 DIAGNOSIS — M6281 Muscle weakness (generalized): Secondary | ICD-10-CM

## 2021-07-12 NOTE — Therapy (Signed)
[facility] [address], [place], [address] Phone: [phone]   Fax:  [phone]  Physical Therapy Treatment  Patient Details  Name: Ronald Horne MRN: [id number] Date of Birth: Oct 25, 1951 Referring Provider (PT): Rosalia Hammers, DO   Encounter Date: 07/12/2021   PT End of Session - 07/12/21 0935     Visit Number 12    Number of Visits 17    Date for PT Re-Evaluation 07/27/21    Authorization Type Medicare    Authorization Time Period Cert 3/15-1/76; recert 1/60/73-05/09/61    Progress Note Due on Visit 20    PT Start Time 0935    PT Stop Time 1015    PT Time Calculation (min) 40 min    Activity Tolerance Patient tolerated treatment well    Behavior During Therapy [facility] for tasks assessed/performed             Past Medical History:  Diagnosis Date   Arthritis    Cardiomyopathy ([place])    a. 08/2016 Echo: EF 30-35%, diff HK; b. 03/2018 Ex MV [facility]s): Ex time 2:00, large anter defect, EF 24%; c. 03/2018 Echo: EF 20-25%, diff HK. Gr1 DD; d. Refused cath.   Chronic back pain    Chronic combined systolic (congestive) and diastolic (congestive) heart failure ([place])    a. 08/2016 Echo: EF 30-35%, diff HK, mild to mod MR, mild BAE; b. 03/2018 Echo: EF 20-25%, diff HK. Gr1 DD. MIldly dil Ao root. Mild BAE. Mildly reduced RV fxn.   GERD (gastroesophageal reflux disease)    Gout    Hypertension    Obesity     Past Surgical History:  Procedure Laterality Date   EPIDURAL BLOCK INJECTION     chronic back pain    LEFT HEART CATH AND CORONARY ANGIOGRAPHY Left 12/27/2019   Procedure: LEFT HEART CATH AND CORONARY ANGIOGRAPHY;  Surgeon: Dionisio David, MD;  Location: [facility];  Service: Cardiovascular;  Laterality: Left;   TONSILLECTOMY      There were no vitals filed for this visit.   Subjective Assessment - 07/12/21 0936     Subjective L shoulder is alright. Just a little stiff this morning. L  elbow is aching a little bit.    Pertinent History L shoulder pain. Pain began about 1 month ago. L shoulder and L elbow bothers him. Pt is R hand dominant. Seen by Marvis Moeller at [facility]. Had an x-ray which revealed arthritis.    Patient Stated Goals Reach behind his back and head, and reach in general without pain.    Currently in Pain? Yes    Pain Score 5    4-5/10   Pain Orientation Left    Pain Descriptors / Indicators Tightness                                        PT Education - 07/12/21 0938     Education Details ther-ex    Person(s) Educated Patient    Methods Explanation;Demonstration;Tactile cues;Verbal cues    Comprehension Returned demonstration;Verbalized understanding            Objective     Blood pressure is controlled per pt. No latex allergies   Medbridge Access Code [email or web address]      Therapeutic exercise  Seated L shoulder self inferior glide 10x5 seconds for 2 sets  Standing  L shoulder weight shifting on table 10x5 seconds for 2 sets to promote posterior glide L glenohumeral joint  Reviewed and given as part of his HEP. Pt demonstrated and verbalized understanding. Handout provided.    Seated L shoulder flexion AROM, mirror visual cues for scapular positioning             5x2  Standing rows blue band 10x5 seconds    Standing B shoulder ER yellow band 10x  Pt education on bursitis, rotator cuff irritation, scapular mechanics, shoulder arthritis to answer pt questions. Visuals used.     Improved exercise technique, movement at target joints, use of target muscles after min to mod verbal, visual, tactile cues.        Response to treatment Pt tolerated session well without aggravation of symptoms.  Decreased L shoulder discomfort and tightness reported after session.     Clinical impression Continued working on improving L shoulder joint mobility, scapular and ER muscle strengthening to promote  better shoulder joint mechanics and ability to raise his arm and perform ADLs. Decreased L shoulder joint discomfort and tightness reported after session. Challenges to progress include pt reporting that he often wakes up on his L side with his arm in flexion and with shoulder pain. Challenging to control position during sleep. Pt will benefit from continued skilled physical therapy services to decrease pain, improve strength and function.       PT Short Term Goals - 06/29/21 1049       PT SHORT TERM GOAL #1   Title Pt will be independent with his initial HEP to decrease pain, improve L shoulder ROM, strength and function.    Baseline Pt has started his initial HEP (04/27/2021)    Time 3    Period Weeks    Status Achieved    Target Date 05/20/21               PT Long Term Goals - 06/29/21 1053       PT LONG TERM GOAL #1   Title Pt will have a decrease in L shoulder pain to 4/10 or less at worst to promote ability to reach up, to the side, as well as behind his head and back more comfortably.    Baseline 10/10 L shoulder pain at most for the past 2 weeks. (04/27/2021); 06/29/21 Pain unchanged in intensity    Time 8    Period Weeks    Status Not Met    Target Date 06/24/21      PT LONG TERM GOAL #2   Title Pt will improve L shoulder flexion and abduction AROM by at least 20 degrees to promote ability to reach with less difficulty.    Baseline L shoulder AROM: 65 degrees flexion, 72 degrees abduction (04/27/2021); 8/30: Flexion met, Abduction not met.    Time 8    Period Weeks    Status Partially Met    Target Date 06/24/21      PT LONG TERM GOAL #3   Title Pt will improve L shoulder strength by at least 1/2 MMT grade to promote ability to reach with less difficulty.    Baseline 4-/5 flexion, and abduction, 4/5 IR, 4+/5 ER, 4/5 lower trap (manually resisted scapular retraction targeting the muscle) 04/27/2021; 8/30: strength largely unchanged    Time 8    Period Weeks    Status  Not Met    Target Date 06/24/21      PT LONG TERM GOAL #4   Title  Pt will improve his FOTO score by at least 10 points as a demonstration of improved function.    Baseline Pt did not complete his initial FOTO (04/27/2021); 06/29/21: FOTO never completed.    Time 8    Period Weeks    Status Not Met    Target Date 06/24/21      PT LONG TERM GOAL #5   Title Pt to demonstrate independence in advanced HEP for moderate resistance loading (reps10-15 to failure) for shoulder flexion, resisted row, shoulder ER, and shoulder abduction to reduce pain sensitivity and help improve return to baseline actiivties currentl ylimited by pain (pulling trash to curb, putting pots/pans overhead).     Baseline 06/29/21: HEP not issued yet, actiivtes not being performed wiht LUE    Time 4    Period Weeks    Status New    Target Date 07/27/21      Additional Long Term Goals   Additional Long Term Goals Yes      PT LONG TERM GOAL #6   Title Pt to demonstrate ability to caryy 10lb load each UE and carry x237f to demonstrate improved capcity to perform grocery shopping.     Baseline 8/30: limited by pain.    Time 4    Period Weeks    Status New    Target Date 07/27/21      PT LONG TERM GOAL #7   Title Pt to demonstrate modified technique for don/doff T-shirt to reduce aggravation of limited ROM in Lt shoulder.     Baseline 06/29/21: Pain with donning shirts    Time 4    Period Weeks    Status New    Target Date 07/27/21                   Plan - 07/12/21 0939     Clinical Impression Statement Continued working on improving L shoulder joint mobility, scapular and ER muscle strengthening to promote better shoulder joint mechanics and ability to raise his arm and perform ADLs. Decreased L shoulder joint discomfort and tightness reported after session. Challenges to progress include pt reporting that he often wakes up on his L side with his arm in flexion and with shoulder pain. Challenging to control  position during sleep. Pt will benefit from continued skilled physical therapy services to decrease pain, improve strength and function.    Personal Factors and Comorbidities Comorbidity 3+;Age;Time since onset of injury/illness/exacerbation;Fitness;Past/Current Experience    Comorbidities Arthritis, cardiomyopathy, chronic back pain, HTN, obesity    Examination-Activity Limitations Lift;Reach Overhead;Carry    Stability/Clinical Decision Making Stable/Uncomplicated    Clinical Decision Making Low    Rehab Potential Fair    PT Frequency 2x / week    PT Duration 4 weeks    PT Treatment/Interventions Therapeutic activities;Therapeutic exercise;Neuromuscular re-education;Patient/family education;Manual techniques;Dry needling;Spinal Manipulations;Joint Manipulations;Electrical Stimulation;Passive range of motion;ADLs/Self Care Home Management;Moist Heat    PT Next Visit Plan Again developed updated HEP to include moderate resistance strength of shoulders (see long-term goals for detail)    PT Home Exercise Plan Medbridge Access Code [email or web address]    Consulted and Agree with Plan of Care Patient             Patient will benefit from skilled therapeutic intervention in order to improve the following deficits and impairments:  Improper body mechanics, Pain, Postural dysfunction, Decreased strength, Decreased range of motion  Visit Diagnosis: Acute pain of left shoulder  Muscle weakness (generalized)     Problem List Patient  Active Problem List   Diagnosis Date Noted   Acute coronary syndrome [facility]) 11/28/2019     Joneen Boers PT, DPT   07/12/2021, 10:28 AM  [facility] [address], [place], [address] Phone: [phone]   Fax:  [phone]  Name: Ronald Horne MRN: [id number] Date of Birth: [birth year]

## 2021-07-14 ENCOUNTER — Ambulatory Visit: Payer: Medicare Other

## 2021-07-14 ENCOUNTER — Telehealth: Payer: Self-pay

## 2021-07-14 NOTE — Telephone Encounter (Signed)
No show. Called patient and left a message pertaining to appointment and a reminder for the next follow up session. Return phone call requested. Phone number ([phone]) provided.   

## 2021-07-20 ENCOUNTER — Telehealth: Payer: Self-pay

## 2021-07-20 ENCOUNTER — Ambulatory Visit: Payer: Medicare Other

## 2021-07-20 NOTE — Telephone Encounter (Signed)
No show. Called patient and left a message pertaining to appointment and a reminder for the next follow up session. Return phone call requested. Phone number ([phone]) provided.   

## 2021-07-22 ENCOUNTER — Ambulatory Visit: Payer: Medicare Other

## 2021-07-27 ENCOUNTER — Ambulatory Visit: Payer: Medicare Other

## 2021-07-29 ENCOUNTER — Telehealth: Payer: Self-pay

## 2021-07-29 ENCOUNTER — Ambulatory Visit: Payer: Medicare Other

## 2021-07-29 NOTE — Telephone Encounter (Signed)
No show. Called pt who said that he got a cyst surgically removed from his head and wants to hold off PT until next month to let it heal. Sweat goes to it when he exercises.

## 2021-08-04 ENCOUNTER — Telehealth: Payer: Self-pay

## 2021-08-04 ENCOUNTER — Ambulatory Visit: Payer: Medicare Other | Attending: Sports Medicine

## 2021-08-04 DIAGNOSIS — M25512 Pain in left shoulder: Secondary | ICD-10-CM | POA: Insufficient documentation

## 2021-08-04 DIAGNOSIS — M6281 Muscle weakness (generalized): Secondary | ICD-10-CM | POA: Insufficient documentation

## 2021-08-04 NOTE — Telephone Encounter (Signed)
No show. Called pt who said that he is unable to go today (including later appointments that are available today) but will be able to make it to his next session.

## 2021-08-11 ENCOUNTER — Ambulatory Visit: Payer: Medicare Other

## 2021-08-11 DIAGNOSIS — M6281 Muscle weakness (generalized): Secondary | ICD-10-CM

## 2021-08-11 DIAGNOSIS — M25512 Pain in left shoulder: Secondary | ICD-10-CM | POA: Diagnosis not present

## 2021-08-11 NOTE — Therapy (Signed)
[facility] [address], [place], [address] Phone: [phone]   Fax:  [phone]  Physical Therapy Treatment  Patient Details  Name: Ronald Horne MRN: [id number] Date of Birth: 1951-10-20 Referring Provider (PT): Rosalia Hammers, DO   Encounter Date: 08/11/2021   PT End of Session - 08/11/21 0933     Visit Number 13    Number of Visits 17    Date for PT Re-Evaluation 09/23/21    Authorization Type Medicare    Progress Note Due on Visit 20    PT Start Time 0933    PT Stop Time 1012    PT Time Calculation (min) 39 min    Activity Tolerance Patient tolerated treatment well    Behavior During Therapy [facility] for tasks assessed/performed             Past Medical History:  Diagnosis Date   Arthritis    Cardiomyopathy ([place])    a. 08/2016 Echo: EF 30-35%, diff HK; b. 03/2018 Ex MV [facility]): Ex time 2:00, large anter defect, EF 24%; c. 03/2018 Echo: EF 20-25%, diff HK. Gr1 DD; d. Refused cath.   Chronic back pain    Chronic combined systolic (congestive) and diastolic (congestive) heart failure ([place])    a. 08/2016 Echo: EF 30-35%, diff HK, mild to mod MR, mild BAE; b. 03/2018 Echo: EF 20-25%, diff HK. Gr1 DD. MIldly dil Ao root. Mild BAE. Mildly reduced RV fxn.   GERD (gastroesophageal reflux disease)    Gout    Hypertension    Obesity     Past Surgical History:  Procedure Laterality Date   EPIDURAL BLOCK INJECTION     chronic back pain    LEFT HEART CATH AND CORONARY ANGIOGRAPHY Left 12/27/2019   Procedure: LEFT HEART CATH AND CORONARY ANGIOGRAPHY;  Surgeon: Dionisio David, MD;  Location: [facility];  Service: Cardiovascular;  Laterality: Left;   TONSILLECTOMY      There were no vitals filed for this visit.   Subjective Assessment - 08/11/21 0934     Subjective L shoulder is aggravating right now. 8/10 currently.    Pertinent History L shoulder pain. Pain began about 1 month ago. L  shoulder and L elbow bothers him. Pt is R hand dominant. Seen by Marvis Moeller at [facility]. Had an x-ray which revealed arthritis.    Patient Stated Goals Reach behind his back and head, and reach in general without pain.    Currently in Pain? Yes    Pain Score 8                                         PT Education - 08/11/21 1011     Education Details ther-ex, HEP    Person(s) Educated Patient    Methods Explanation;Demonstration;Tactile cues;Verbal cues;Handout    Comprehension Returned demonstration;Verbalized understanding            Objective     Blood pressure is controlled per pt. No latex allergies   Medbridge Access Code [email or web address]      Therapeutic exercise   L shoulder flexion and abduction AROM   Manually resisted L shoulder flexion, abduction, ER, IR 1x each way   Increased time spent to re-enforce importance of HEP secondary to pt reporting not really performing them and wanting a cortisone injection as well as avoiding  surgery  Carrying 10 lbs each hand 200 ft   Seated L shoulder abduction AAROM with SPC 10x3  Feels like pressure continues to relieve.   Reviewed and given as part of HEP. Pt demonstrated and verbalized understanding.        Improved exercise technique, movement at target joints, use of target muscles after min to mod verbal, visual, tactile cues.     Manual therapy     Seated STM L upper trap area to decrease muscle tension and soft tissue restrictions   Seated STM L pectoralis major muscle to decrease tension           Response to treatment Pt tolerated session well without aggravation of symptoms.  Decreased L shoulder discomfort and tightness reported after session.     Clinical impression Pt demonstrates slight decrease in L shoulder pain, improved overall L shoulder AROM and strength, as well as improved ability to carry load (groceries/10 lbs each hand) about 200 ft without  shoulder pain. Pt making some progress with PT towards goals. Challenges to progress however include non-compliance to his home exercise program and time was spent re-enforcing importance in performing his HEP to help improve mechanics especially and decrease shoulder joint irritation. Decreased shoulder stiffness reported after session. Pt will benefit from continued skilled physical therapy services to decrease pain, improve strength and function.        PT Short Term Goals - 06/29/21 1049       PT SHORT TERM GOAL #1   Title Pt will be independent with his initial HEP to decrease pain, improve L shoulder ROM, strength and function.    Baseline Pt has started his initial HEP (04/27/2021)    Time 3    Period Weeks    Status Achieved    Target Date 05/20/21               PT Long Term Goals - 08/11/21 0935       PT LONG TERM GOAL #1   Title Pt will have a decrease in L shoulder pain to 4/10 or less at worst to promote ability to reach up, to the side, as well as behind his head and back more comfortably.    Baseline 10/10 L shoulder pain at most for the past 2 weeks. (04/27/2021); 06/29/21 Pain unchanged in intensity; 9/10 L shoulder at most for the past 7 days (08/11/2021)    Time 6    Period Weeks    Status On-going    Target Date 09/23/21      PT LONG TERM GOAL #2   Title Pt will improve L shoulder flexion and abduction AROM by at least 20 degrees to promote ability to reach with less difficulty.    Baseline L shoulder AROM: 65 degrees flexion, 72 degrees abduction (04/27/2021); 8/30: Flexion met, Abduction not met.; 104 degrees L shoulder flexion, 93 degrees abduction (08/11/2021)    Time 8    Period Weeks    Status Achieved    Target Date 07/27/21      PT LONG TERM GOAL #3   Title Pt will improve L shoulder strength by at least 1/2 MMT grade to promote ability to reach with less difficulty.    Baseline 4-/5 flexion, and abduction, 4/5 IR, 4+/5 ER, 4/5 lower trap (manually  resisted scapular retraction targeting the muscle) 04/27/2021; 8/30: strength largely unchanged; 4/5 flexion, 4/5 abduction, 4/5 ER, 4/5 IR, 4/5 lower trap (manually resisted scapular retraction targeting the muscle) 08/11/2021  Time 6    Period Weeks    Status Partially Met    Target Date 09/23/21      PT LONG TERM GOAL #4   Title Pt will improve his FOTO score by at least 10 points as a demonstration of improved function.    Baseline Pt did not complete his initial FOTO (04/27/2021); 06/29/21: FOTO never completed.    Time 8    Period Weeks    Status Unable to assess      PT LONG TERM GOAL #5   Title Pt to demonstrate independence in advanced HEP for moderate resistance loading (reps10-15 to failure) for shoulder flexion, resisted row, shoulder ER, and shoulder abduction to reduce pain sensitivity and help improve return to baseline actiivties currentl ylimited by pain (pulling trash to curb, putting pots/pans overhead).    Baseline 06/29/21: HEP not issued yet, actiivtes not being performed wiht LUE; Initial HEP issued at eval but pt demonstrates non-compliance with home program (08/11/2021)    Time 6    Period Weeks    Status On-going    Target Date 09/23/21      PT LONG TERM GOAL #6   Title Pt to demonstrate ability to caryy 10lb load each UE and carry x237f to demonstrate improved capcity to perform grocery shopping.    Baseline 8/30: limited by pain. Able to perform, 10 lbs each hand 200 ft (08/11/2021)    Time 4    Period Weeks    Status Achieved    Target Date 08/26/21      PT LONG TERM GOAL #7   Title Pt to demonstrate modified technique for don/doff T-shirt to reduce aggravation of limited ROM in Lt shoulder.    Baseline 06/29/21: Pain with donning shirts    Time 6    Period Weeks    Status Unable to assess    Target Date 09/23/21                   Plan - 08/11/21 1305     Clinical Impression Statement Pt demonstrates slight decrease in L shoulder pain,  improved overall L shoulder AROM and strength, as well as improved ability to carry load (groceries/10 lbs each hand) about 200 ft without shoulder pain. Pt making some progress with PT towards goals. Challenges to progress however include non-compliance to his home exercise program and time was spent re-enforcing importance in performing his HEP to help improve mechanics especially and decrease shoulder joint irritation. Decreased shoulder stiffness reported after session. Pt will benefit from continued skilled physical therapy services to decrease pain, improve strength and function.    Personal Factors and Comorbidities Comorbidity 3+;Age;Time since onset of injury/illness/exacerbation;Fitness;Past/Current Experience    Comorbidities Arthritis, cardiomyopathy, chronic back pain, HTN, obesity    Examination-Activity Limitations Lift;Reach Overhead;Carry    Stability/Clinical Decision Making Stable/Uncomplicated    Clinical Decision Making Low    Rehab Potential Fair    PT Frequency 2x / week    PT Duration 4 weeks    PT Treatment/Interventions Therapeutic activities;Therapeutic exercise;Neuromuscular re-education;Patient/family education;Manual techniques;Dry needling;Spinal Manipulations;Joint Manipulations;Electrical Stimulation;Passive range of motion;ADLs/Self Care Home Management;Moist Heat    PT Next Visit Plan Again developed updated HEP to include moderate resistance strength of shoulders (see long-term goals for detail)    PT Home Exercise Plan Medbridge Access Code [email or web address]    Consulted and Agree with Plan of Care Patient             Patient will benefit from skilled  therapeutic intervention in order to improve the following deficits and impairments:  Improper body mechanics, Pain, Postural dysfunction, Decreased strength, Decreased range of motion  Visit Diagnosis: Acute pain of left shoulder - Plan: PT plan of care cert/re-cert  Muscle weakness (generalized) - Plan: PT plan of  care cert/re-cert     Problem List Patient Active Problem List   Diagnosis Date Noted   Acute coronary syndrome [facility]) 11/28/2019   Joneen Boers PT, DPT   08/11/2021, 1:17 PM  [facility] [address], [place], [address] Phone: [phone]   Fax:  [phone]  Name: Ronald Horne MRN: [id number] Date of Birth: 1951/02/23

## 2021-08-11 NOTE — Patient Instructions (Signed)
Seated L shoulder abduction AAROM with SPC 10x2  Feels like pressure continues to relieve.   Reviewed and given as part of HEP. Pt demonstrated and verbalized understanding.

## 2021-08-16 ENCOUNTER — Ambulatory Visit: Payer: Medicare Other

## 2021-08-18 ENCOUNTER — Telehealth: Payer: Self-pay

## 2021-08-18 ENCOUNTER — Ambulatory Visit: Payer: Medicare Other

## 2021-08-18 NOTE — Telephone Encounter (Signed)
No show. Called pt pertaining to today's appointment. Also asked pt if he would like [place] to take him off the schedule and reschedule when he is able to come to PT secondary to pt not being able to attend the past couple of PT sessions. Return phone call requested. Phone number provided [phone])

## 2021-08-23 ENCOUNTER — Telehealth: Payer: Self-pay

## 2021-08-23 ENCOUNTER — Ambulatory Visit: Payer: Medicare Other

## 2021-08-23 NOTE — Telephone Encounter (Signed)
No show. Left a massage pertaining to the no show/cancellation policy and due to the multiple no-shows, we have to remove him from the schedule. Asked pt to call back if he has any questions.

## 2021-08-26 ENCOUNTER — Ambulatory Visit: Payer: Medicare Other

## 2021-08-31 ENCOUNTER — Ambulatory Visit: Payer: Medicare Other

## 2021-10-19 ENCOUNTER — Emergency Department: Payer: Medicare Other

## 2021-10-19 ENCOUNTER — Emergency Department
Admission: EM | Admit: 2021-10-19 | Discharge: 2021-10-19 | Disposition: A | Discharge status: Home / Self Care | Payer: Medicare Other | Attending: Emergency Medicine | Admitting: Emergency Medicine

## 2021-10-19 ENCOUNTER — Other Ambulatory Visit: Payer: Self-pay

## 2021-10-19 ENCOUNTER — Encounter: Payer: Self-pay | Admitting: Emergency Medicine

## 2021-10-19 DIAGNOSIS — U071 COVID-19: Secondary | ICD-10-CM | POA: Diagnosis not present

## 2021-10-19 DIAGNOSIS — Z79899 Other long term (current) drug therapy: Secondary | ICD-10-CM | POA: Diagnosis not present

## 2021-10-19 DIAGNOSIS — Z87891 Personal history of nicotine dependence: Secondary | ICD-10-CM | POA: Insufficient documentation

## 2021-10-19 DIAGNOSIS — I5042 Chronic combined systolic (congestive) and diastolic (congestive) heart failure: Secondary | ICD-10-CM | POA: Insufficient documentation

## 2021-10-19 DIAGNOSIS — Z7982 Long term (current) use of aspirin: Secondary | ICD-10-CM | POA: Insufficient documentation

## 2021-10-19 DIAGNOSIS — R059 Cough, unspecified: Secondary | ICD-10-CM | POA: Diagnosis present

## 2021-10-19 DIAGNOSIS — I110 Hypertensive heart disease with heart failure: Secondary | ICD-10-CM | POA: Diagnosis not present

## 2021-10-19 LAB — RESP PANEL BY RT-PCR (FLU A&B, COVID) ARPGX2
Influenza A by PCR: NEGATIVE
Influenza B by PCR: NEGATIVE
SARS Coronavirus 2 by RT PCR: POSITIVE — AB

## 2021-10-19 NOTE — ED Notes (Signed)
States has had a cough and running nose for over a week. Has been taking Dayqual with no relief. Denies pain.

## 2021-10-19 NOTE — Discharge Instructions (Signed)

## 2021-10-19 NOTE — ED Triage Notes (Signed)
Pt in with cough and congestion for few days, no fever.

## 2021-10-19 NOTE — ED Provider Notes (Signed)
[facility] Emergency Department Provider Note  ____________________________________________   Event Date/Time   First MD Initiated Contact with Patient 10/19/21 [id number]     (approximate)  I have reviewed the triage vital signs and the nursing notes.   HISTORY  Chief Complaint Cough    HPI Ronald Horne is a 70 y.o. male presents to the emergency department with URI symptoms for 2 days.   Is complaining of cough, congestion, fever, chills, denies chest pain, shortness of breath close contact with Covid19+ patient, patient is vaccinated for COVID and flu.   Past Medical History:  Diagnosis Date   Arthritis    Cardiomyopathy (HCC)    a. 08/2016 Echo: EF 30-35%, diff HK; b. 03/2018 Ex MV (Jadali): Ex time 2:00, large anter defect, EF 24%; c. 03/2018 Echo: EF 20-25%, diff HK. Gr1 DD; d. Refused cath.   Chronic back pain    Chronic combined systolic (congestive) and diastolic (congestive) heart failure (HCC)    a. 08/2016 Echo: EF 30-35%, diff HK, mild to mod MR, mild BAE; b. 03/2018 Echo: EF 20-25%, diff HK. Gr1 DD. MIldly dil Ao root. Mild BAE. Mildly reduced RV fxn.   GERD (gastroesophageal reflux disease)    Gout    Hypertension    Obesity     Patient Active Problem List   Diagnosis Date Noted   Acute coronary syndrome (HCC) 11/28/2019    Past Surgical History:  Procedure Laterality Date   EPIDURAL BLOCK INJECTION     chronic back pain    LEFT HEART CATH AND CORONARY ANGIOGRAPHY Left 12/27/2019   Procedure: LEFT HEART CATH AND CORONARY ANGIOGRAPHY;  Surgeon: Laurier Nancy, MD;  Location: [facility] INVASIVE CV LAB;  Service: Cardiovascular;  Laterality: Left;   TONSILLECTOMY      Prior to Admission medications   Medication Sig Start Date End Date Taking? Authorizing Provider  ARIPiprazole (ABILIFY) 5 MG tablet Take 5 mg by mouth daily.    [provider]  aspirin 81 MG tablet Take 81 mg by mouth daily.    [provider]   atorvastatin (LIPITOR) 10 MG tablet Take 10 mg by mouth daily.    [provider]  furosemide (LASIX) 40 MG tablet Take 1 tablet (40 mg total) by mouth daily. 04/12/18 07/11/18  Iran Ouch, MD  lisinopril (PRINIVIL,ZESTRIL) 40 MG tablet Take 40 mg by mouth daily.    [provider]  methimazole (TAPAZOLE) 5 MG tablet Take 5 mg by mouth daily.    [provider]  metoprolol succinate (TOPROL-XL) 100 MG 24 hr tablet Take 100 mg by mouth daily. Take with or immediately following a meal.    [provider]  metoprolol tartrate (LOPRESSOR) 25 MG tablet Take 100 mg by mouth 2 (two) times daily.     [provider]  mirtazapine (REMERON) 30 MG tablet Take 30 mg by mouth at bedtime.    [provider]  naproxen (NAPROSYN) 500 MG tablet Take 500 mg by mouth every 8 (eight) hours as needed for mild pain.     [provider]  potassium chloride SA (K-DUR,KLOR-CON) 20 MEQ tablet Take 1 tablet (20 mEq total) by mouth daily. 04/12/18   Iran Ouch, MD  QUEtiapine (SEROQUEL) 25 MG tablet Take 25 mg by mouth at bedtime.    [provider]    Allergies Patient has no known allergies.  No family history on file.  Social History Social History   Tobacco Use  Smoking status: Former    Packs/day: 3.00    Years: 1.00    Pack years: 3.00    Types: Cigarettes    Quit date: 12/12/1976    Years since quitting: 44.8   Smokeless tobacco: Never  Vaping Use   Vaping Use: Never used  Substance Use Topics   Alcohol use: No   Drug use: No    Review of Systems  Constitutional: Positive fever/chills Eyes: No visual changes. ENT: Denies sore throat. Respiratory: Positive cough Cardiovascular: Denies chest pain Gastrointestinal: Denies abdominal pain Genitourinary: Negative for dysuria. Musculoskeletal: Negative for back pain. Skin: Negative for rash. Neurological: Denies neurological  changes    ____________________________________________   PHYSICAL EXAM:  VITAL SIGNS: ED Triage Vitals  Enc Vitals Group     BP 10/19/21 0709 (!) 134/96     Pulse Rate 10/19/21 0709 (!) 127     Resp 10/19/21 0709 20     Temp 10/19/21 0709 98.6 F (37 C)     Temp Source 10/19/21 0709 Oral     SpO2 10/19/21 0709 97 %     Weight 10/19/21 0710 (!) 350 lb (158.8 kg)     Height 10/19/21 0710 6\' 2"  (1.88 m)     Head Circumference --      Peak Flow --      Pain Score 10/19/21 0712 0     Pain Loc --      Pain Edu? --      Excl. in GC? --     Constitutional: Alert and oriented. Well appearing and in no acute distress. Eyes: Conjunctivae are normal.  Head: Atraumatic. Nose: No congestion/rhinnorhea. Mouth/Throat: Mucous membranes are moist.   Neck:  supple no lymphadenopathy noted Cardiovascular: Normal rate, regular rhythm. Heart sounds are normal Respiratory: Normal respiratory effort.  No retractions, lungs CTA GU: deferred Musculoskeletal: FROM all extremities, warm and well perfused Neurologic:  Normal speech and language.  Skin:  Skin is warm, dry and intact. No rash noted. Psychiatric: Mood and affect are normal. Speech and behavior are normal.  ____________________________________________   LABS (all labs ordered are listed, but only abnormal results are displayed)  Labs Reviewed  RESP PANEL BY RT-PCR (FLU A&B, COVID) ARPGX2 - Abnormal; Notable for the following components:      Result Value   SARS Coronavirus 2 by RT PCR POSITIVE (*)    All other components within normal limits   ____________________________________________   ____________________________________________  RADIOLOGY  Chest x-ray  ____________________________________________   PROCEDURES  Procedure(s) performed: No  Procedures    ____________________________________________   INITIAL IMPRESSION / ASSESSMENT AND PLAN / ED COURSE  Pertinent labs & imaging results that were  available during my care of the patient were reviewed by me and considered in my medical decision making (see chart for details).   Patient is a 70 year old male who complains of URI symptoms.  Patient has had 3 COVID vaccines and a flu vaccine 2 and half weeks ago.  Pending test for covid/flu Chest x-ray  Chest x-ray reviewed by me confirmed by radiology to be negative Respiratory panel is positive covid  I did explain the findings to the patient.  He is to take over-the-counter medications for comfort.  Follow-up with the emergency department if worsening.  See his regular doctor if not improving 1 week.  Quarantine instructions were discussed.  Patient is in agreement treatment plan.  Discharged in stable condition.   Ronald Horne was evaluated in Emergency Department on 10/19/2021 for the  symptoms described in the history of present illness. He was evaluated in the context of the global COVID-19 pandemic, which necessitated consideration that the patient might be at risk for infection with the SARS-CoV-2 virus that causes COVID-19. Institutional protocols and algorithms that pertain to the evaluation of patients at risk for COVID-19 are in a state of rapid change based on information released by regulatory bodies including the CDC and federal and state organizations. These policies and algorithms were followed during the patient's care in the ED.   As part of my medical decision making, I reviewed the following data within the electronic MEDICAL RECORD NUMBER Nursing notes reviewed and incorporated, Labs reviewed , Old chart reviewed, Radiograph reviewed , Notes from prior ED visits, and [place] Controlled Substance Database  ____________________________________________   FINAL CLINICAL IMPRESSION(S) / ED DIAGNOSES  Final diagnoses:  COVID-19      NEW MEDICATIONS STARTED DURING THIS VISIT:  Discharge Medication List as of 10/19/2021  9:42 AM       Note:  This document was prepared  using Dragon voice recognition software and may include unintentional dictation errors.    Faythe Ghee, PA-C 10/19/21 1354    Arnaldo Natal, MD 10/19/21 [id number]

## 2022-06-04 IMAGING — CR DG CHEST 2V
2 series · 2 of 2 positions shown · non-contrast
Comparison: None.

CLINICAL DATA: Cough, congestion

EXAM:
CHEST - 2 VIEW

[chest pa]
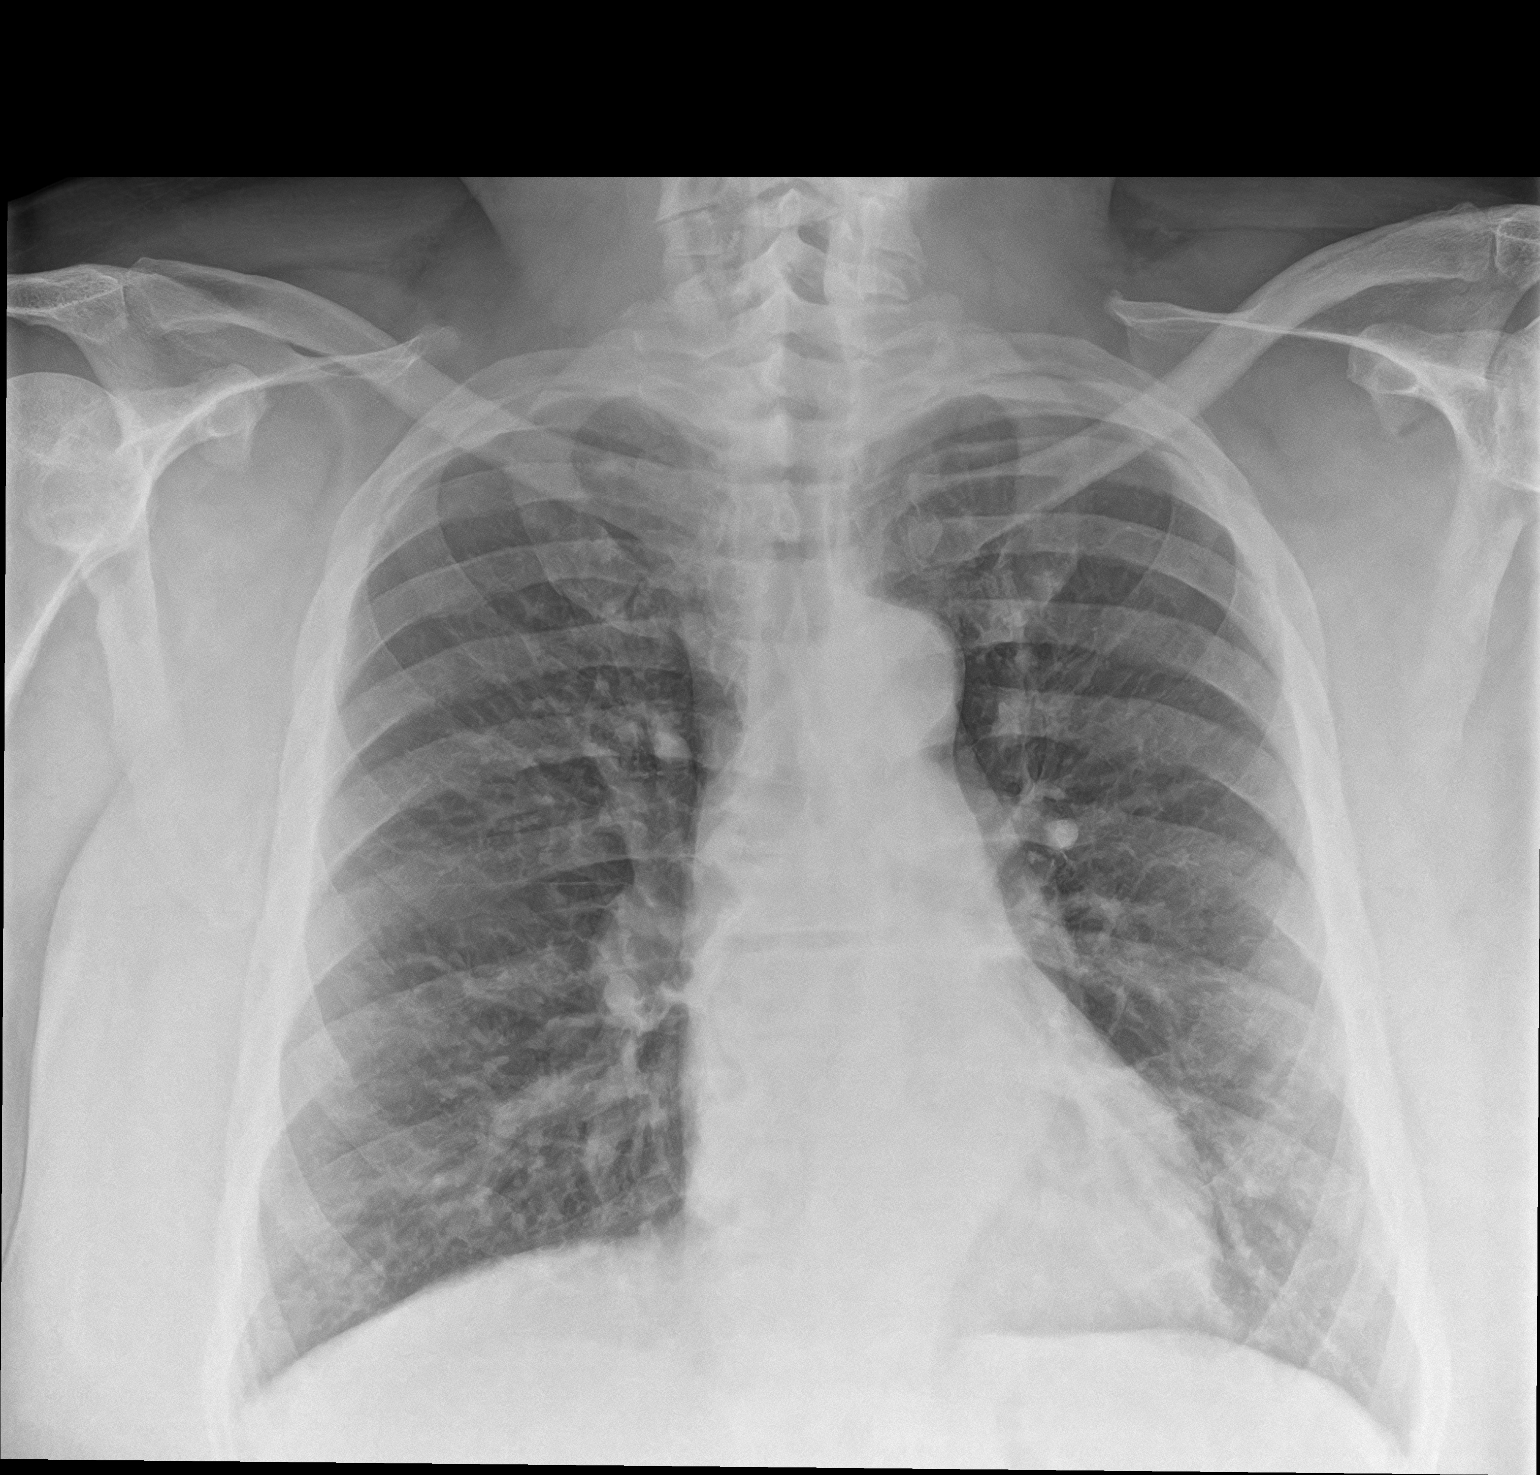

[chest lat]
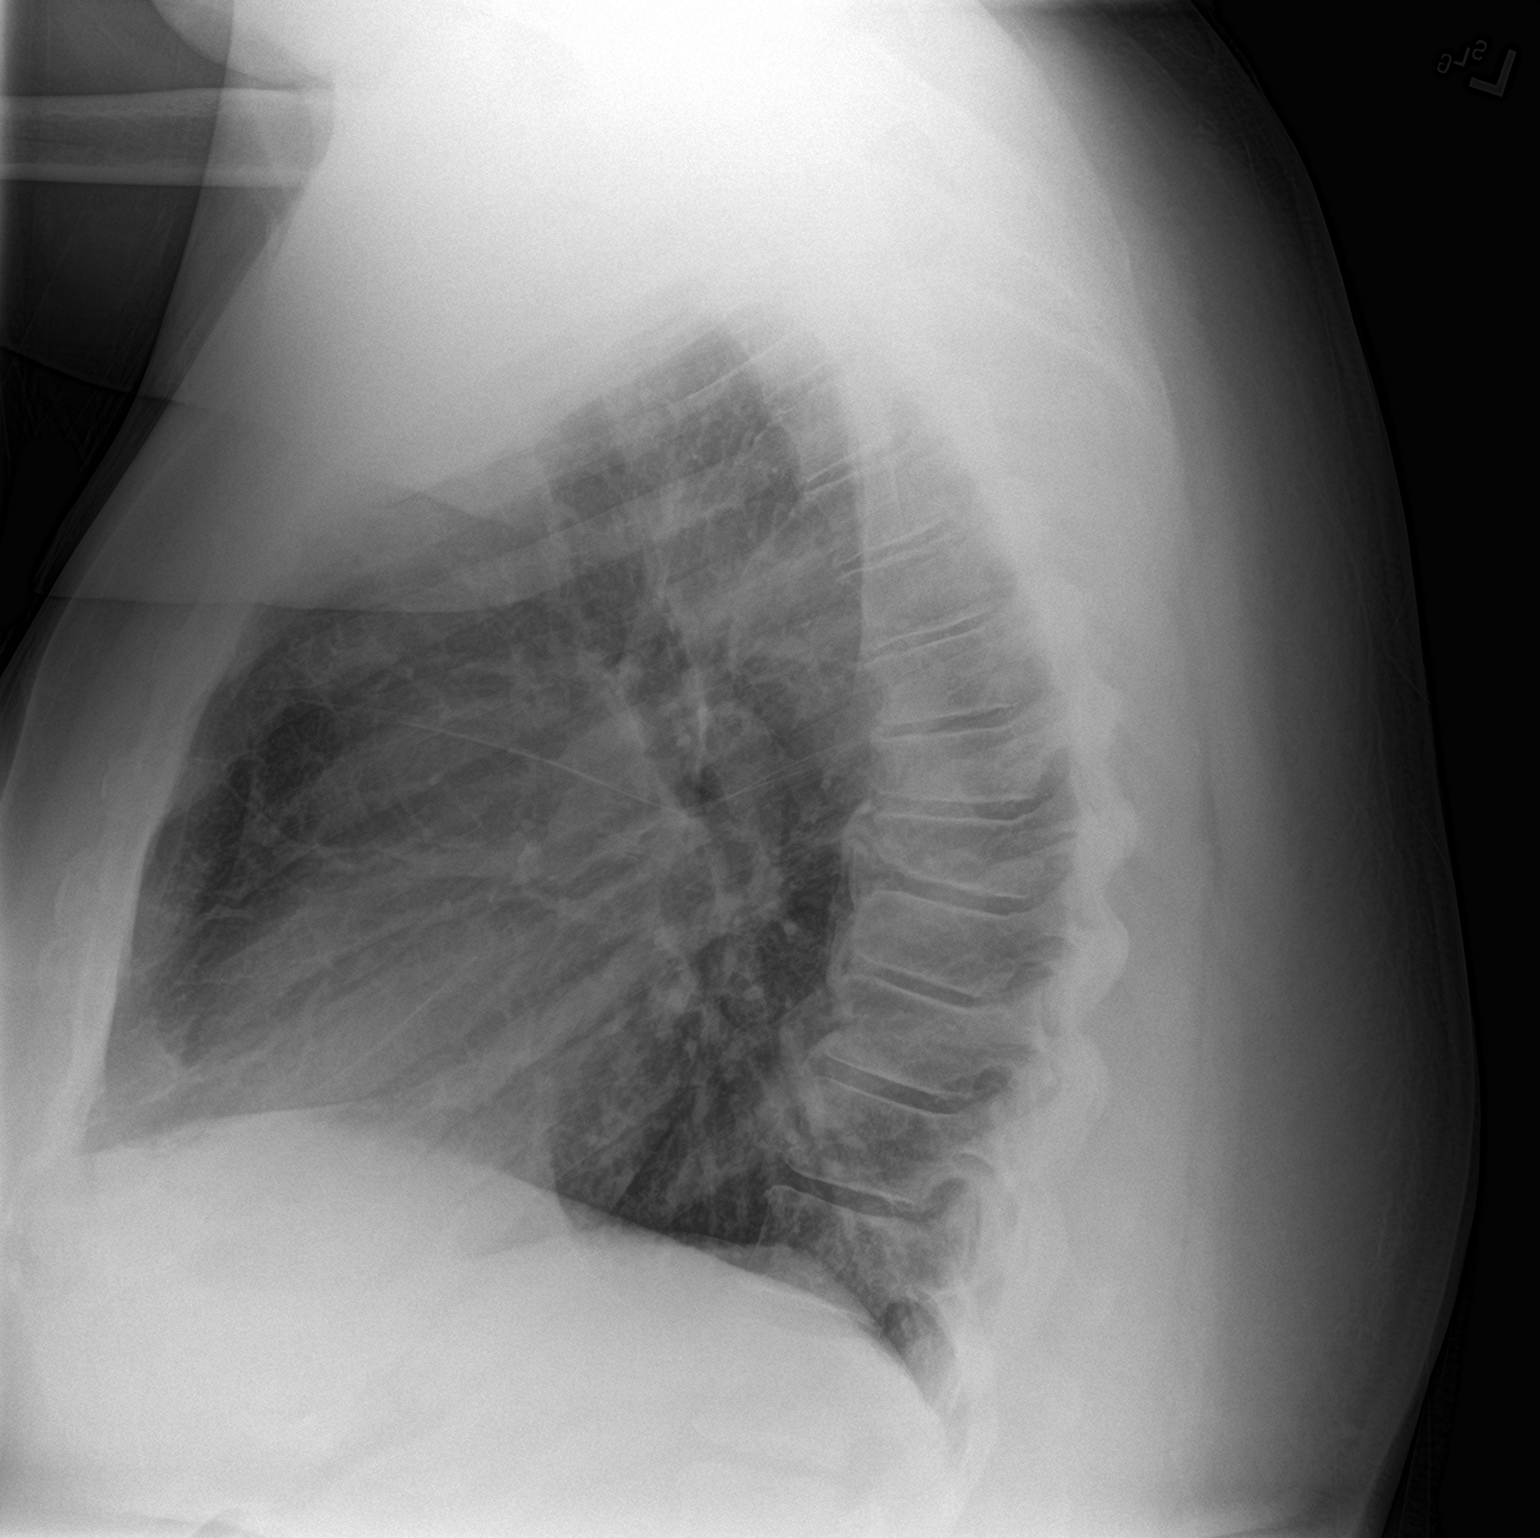

[2 of 2 positions shown; findings below may reference images not displayed]

FINDINGS: Heart and mediastinal contours are within normal limits. No focal
opacities or effusions. No acute bony abnormality.
IMPRESSION: No active cardiopulmonary disease.

## 2022-07-30 ENCOUNTER — Emergency Department: Payer: Medicare HMO

## 2022-07-30 ENCOUNTER — Encounter: Payer: Self-pay | Admitting: Emergency Medicine

## 2022-07-30 ENCOUNTER — Inpatient Hospital Stay
Admission: EM | Admit: 2022-07-30 | Discharge: 2022-08-01 | DRG: 291 | Disposition: A | Discharge status: Home / Self Care | Payer: Medicare HMO | Attending: Internal Medicine | Admitting: Internal Medicine

## 2022-07-30 ENCOUNTER — Other Ambulatory Visit: Payer: Self-pay

## 2022-07-30 DIAGNOSIS — J9601 Acute respiratory failure with hypoxia: Secondary | ICD-10-CM | POA: Diagnosis present

## 2022-07-30 DIAGNOSIS — R739 Hyperglycemia, unspecified: Secondary | ICD-10-CM | POA: Diagnosis present

## 2022-07-30 DIAGNOSIS — Z87891 Personal history of nicotine dependence: Secondary | ICD-10-CM | POA: Diagnosis not present

## 2022-07-30 DIAGNOSIS — I2489 Other forms of acute ischemic heart disease: Secondary | ICD-10-CM | POA: Diagnosis present

## 2022-07-30 DIAGNOSIS — R7989 Other specified abnormal findings of blood chemistry: Secondary | ICD-10-CM | POA: Diagnosis present

## 2022-07-30 DIAGNOSIS — I509 Heart failure, unspecified: Secondary | ICD-10-CM | POA: Insufficient documentation

## 2022-07-30 DIAGNOSIS — E0590 Thyrotoxicosis, unspecified without thyrotoxic crisis or storm: Secondary | ICD-10-CM | POA: Diagnosis present

## 2022-07-30 DIAGNOSIS — K219 Gastro-esophageal reflux disease without esophagitis: Secondary | ICD-10-CM | POA: Diagnosis present

## 2022-07-30 DIAGNOSIS — E669 Obesity, unspecified: Secondary | ICD-10-CM | POA: Diagnosis present

## 2022-07-30 DIAGNOSIS — I429 Cardiomyopathy, unspecified: Secondary | ICD-10-CM | POA: Diagnosis present

## 2022-07-30 DIAGNOSIS — E876 Hypokalemia: Secondary | ICD-10-CM | POA: Diagnosis present

## 2022-07-30 DIAGNOSIS — I110 Hypertensive heart disease with heart failure: Secondary | ICD-10-CM | POA: Diagnosis present

## 2022-07-30 DIAGNOSIS — I5023 Acute on chronic systolic (congestive) heart failure: Secondary | ICD-10-CM | POA: Diagnosis not present

## 2022-07-30 DIAGNOSIS — I5043 Acute on chronic combined systolic (congestive) and diastolic (congestive) heart failure: Secondary | ICD-10-CM | POA: Diagnosis present

## 2022-07-30 DIAGNOSIS — Z6841 Body Mass Index (BMI) 40.0 and over, adult: Secondary | ICD-10-CM | POA: Diagnosis not present

## 2022-07-30 DIAGNOSIS — Z79899 Other long term (current) drug therapy: Secondary | ICD-10-CM | POA: Diagnosis not present

## 2022-07-30 DIAGNOSIS — Z20822 Contact with and (suspected) exposure to covid-19: Secondary | ICD-10-CM | POA: Diagnosis present

## 2022-07-30 DIAGNOSIS — Z7982 Long term (current) use of aspirin: Secondary | ICD-10-CM | POA: Diagnosis not present

## 2022-07-30 DIAGNOSIS — I5021 Acute systolic (congestive) heart failure: Secondary | ICD-10-CM | POA: Diagnosis present

## 2022-07-30 DIAGNOSIS — T502X5A Adverse effect of carbonic-anhydrase inhibitors, benzothiadiazides and other diuretics, initial encounter: Secondary | ICD-10-CM | POA: Diagnosis present

## 2022-07-30 DIAGNOSIS — I10 Essential (primary) hypertension: Secondary | ICD-10-CM | POA: Diagnosis present

## 2022-07-30 LAB — CBC WITH DIFFERENTIAL/PLATELET
Abs Immature Granulocytes: 0.04 10*3/uL (ref 0.00–0.07)
Basophils Absolute: 0.1 10*3/uL (ref 0.0–0.1)
Basophils Relative: 0 %
Eosinophils Absolute: 0.1 10*3/uL (ref 0.0–0.5)
Eosinophils Relative: 1 %
HCT: 51 % (ref 39.0–52.0)
Hemoglobin: 15.8 g/dL (ref 13.0–17.0)
Immature Granulocytes: 0 %
Lymphocytes Relative: 46 %
Lymphs Abs: 5.2 10*3/uL — ABNORMAL HIGH (ref 0.7–4.0)
MCH: 30.3 pg (ref 26.0–34.0)
MCHC: 31 g/dL (ref 30.0–36.0)
MCV: 97.9 fL (ref 80.0–100.0)
Monocytes Absolute: 1.1 10*3/uL — ABNORMAL HIGH (ref 0.1–1.0)
Monocytes Relative: 10 %
Neutro Abs: 4.9 10*3/uL (ref 1.7–7.7)
Neutrophils Relative %: 43 %
Platelets: 210 10*3/uL (ref 150–400)
RBC: 5.21 MIL/uL (ref 4.22–5.81)
RDW: 14.4 % (ref 11.5–15.5)
WBC: 11.3 10*3/uL — ABNORMAL HIGH (ref 4.0–10.5)
nRBC: 0 % (ref 0.0–0.2)

## 2022-07-30 LAB — COMPREHENSIVE METABOLIC PANEL
ALT: 67 U/L — ABNORMAL HIGH (ref 0–44)
AST: 108 U/L — ABNORMAL HIGH (ref 15–41)
Albumin: 3.4 g/dL — ABNORMAL LOW (ref 3.5–5.0)
Alkaline Phosphatase: 79 U/L (ref 38–126)
Anion gap: 10 (ref 5–15)
BUN: 12 mg/dL (ref 8–23)
CO2: 19 mmol/L — ABNORMAL LOW (ref 22–32)
Calcium: 8.3 mg/dL — ABNORMAL LOW (ref 8.9–10.3)
Chloride: 108 mmol/L (ref 98–111)
Creatinine, Ser: 1.1 mg/dL (ref 0.61–1.24)
GFR, Estimated: >60 mL/min (ref >60)
Glucose, Bld: 212 mg/dL — ABNORMAL HIGH (ref 70–99)
Potassium: 3.4 mmol/L — ABNORMAL LOW (ref 3.5–5.1)
Sodium: 137 mmol/L (ref 135–145)
Total Bilirubin: 0.8 mg/dL (ref 0.3–1.2)
Total Protein: 6.6 g/dL (ref 6.5–8.1)

## 2022-07-30 LAB — BLOOD GAS, ARTERIAL
Acid-base deficit: 9.9 mmol/L — ABNORMAL HIGH (ref 0.0–2.0)
Bicarbonate: 16.1 mmol/L — ABNORMAL LOW (ref 20.0–28.0)
FIO2: 100 %
O2 Saturation: 100 %
Patient temperature: 37
pCO2 arterial: 35 mmHg (ref 32–48)
pH, Arterial: 7.27 — ABNORMAL LOW (ref 7.35–7.45)
pO2, Arterial: 155 mmHg — ABNORMAL HIGH (ref 83–108)

## 2022-07-30 LAB — BRAIN NATRIURETIC PEPTIDE: B Natriuretic Peptide: 591.4 pg/mL — ABNORMAL HIGH (ref 0.0–100.0)

## 2022-07-30 LAB — TSH: TSH: 0.376 u[IU]/mL (ref 0.350–4.500)

## 2022-07-30 LAB — RESP PANEL BY RT-PCR (FLU A&B, COVID) ARPGX2
Influenza A by PCR: NEGATIVE
Influenza B by PCR: NEGATIVE
SARS Coronavirus 2 by RT PCR: NEGATIVE

## 2022-07-30 LAB — TROPONIN I (HIGH SENSITIVITY)
Troponin I (High Sensitivity): 28 ng/L — ABNORMAL HIGH (ref <18)
Troponin I (High Sensitivity): 49 ng/L — ABNORMAL HIGH (ref <18)
Troponin I (High Sensitivity): 71 ng/L — ABNORMAL HIGH (ref <18)

## 2022-07-30 MED ORDER — METOPROLOL SUCCINATE ER 50 MG PO TB24: 100.0000 mg | ORAL_TABLET | Freq: Every day | ORAL | Status: DC

## 2022-07-30 MED ORDER — IVABRADINE HCL 5 MG PO TABS
7.5000 mg | ORAL_TABLET | Freq: Two times a day (BID) | ORAL | Status: DC
  Administered 2022-07-30 – 2022-07-31 (×3): 7.5 mg via ORAL
  Filled 2022-07-30 (×4): qty 2

## 2022-07-30 MED ORDER — ONDANSETRON HCL 4 MG/2ML IJ SOLN: 4.0000 mg | Freq: Four times a day (QID) | INTRAMUSCULAR | Status: DC | PRN

## 2022-07-30 MED ORDER — METHIMAZOLE 5 MG PO TABS
7.5000 mg | ORAL_TABLET | Freq: Every day | ORAL | Status: DC
  Administered 2022-07-30 – 2022-07-31 (×2): 7.5 mg via ORAL
  Filled 2022-07-30 (×3): qty 2

## 2022-07-30 MED ORDER — DAPAGLIFLOZIN PROPANEDIOL 10 MG PO TABS
10.0000 mg | ORAL_TABLET | Freq: Every day | ORAL | Status: DC
  Administered 2022-07-31: 10 mg via ORAL
  Filled 2022-07-30 (×2): qty 1

## 2022-07-30 MED ORDER — ATORVASTATIN CALCIUM 10 MG PO TABS
10.0000 mg | ORAL_TABLET | Freq: Every evening | ORAL | Status: DC
  Administered 2022-07-30 – 2022-07-31 (×2): 10 mg via ORAL
  Filled 2022-07-30 (×2): qty 1

## 2022-07-30 MED ORDER — ACETAMINOPHEN 650 MG RE SUPP: 650.0000 mg | Freq: Four times a day (QID) | RECTAL | Status: DC | PRN

## 2022-07-30 MED ORDER — LISINOPRIL 10 MG PO TABS: 40.0000 mg | ORAL_TABLET | Freq: Every day | ORAL | Status: DC

## 2022-07-30 MED ORDER — POTASSIUM CHLORIDE CRYS ER 20 MEQ PO TBCR: 20.0000 meq | EXTENDED_RELEASE_TABLET | Freq: Every day | ORAL | Status: DC

## 2022-07-30 MED ORDER — IPRATROPIUM-ALBUTEROL 0.5-2.5 (3) MG/3ML IN SOLN
3.0000 mL | Freq: Once | RESPIRATORY_TRACT | Status: AC
  Administered 2022-07-30: 3 mL via RESPIRATORY_TRACT
  Filled 2022-07-30: qty 3

## 2022-07-30 MED ORDER — ENOXAPARIN SODIUM 80 MG/0.8ML IJ SOSY
0.5000 mg/kg | PREFILLED_SYRINGE | INTRAMUSCULAR | Status: DC
  Administered 2022-07-30 – 2022-07-31 (×2): 75 mg via SUBCUTANEOUS
  Filled 2022-07-30: qty 0.75
  Filled 2022-07-30: qty 0.8

## 2022-07-30 MED ORDER — ASPIRIN 81 MG PO TBEC
81.0000 mg | DELAYED_RELEASE_TABLET | Freq: Every day | ORAL | Status: DC
  Administered 2022-07-30 – 2022-08-01 (×3): 81 mg via ORAL
  Filled 2022-07-30 (×3): qty 1

## 2022-07-30 MED ORDER — POTASSIUM CHLORIDE CRYS ER 20 MEQ PO TBCR
40.0000 meq | EXTENDED_RELEASE_TABLET | Freq: Once | ORAL | Status: AC
  Administered 2022-07-30: 40 meq via ORAL
  Filled 2022-07-30: qty 2

## 2022-07-30 MED ORDER — ONDANSETRON HCL 4 MG PO TABS: 4.0000 mg | ORAL_TABLET | Freq: Four times a day (QID) | ORAL | Status: DC | PRN

## 2022-07-30 MED ORDER — ARIPIPRAZOLE 5 MG PO TABS: 5.0000 mg | ORAL_TABLET | Freq: Every day | ORAL | Status: DC

## 2022-07-30 MED ORDER — SPIRONOLACTONE 25 MG PO TABS
12.5000 mg | ORAL_TABLET | Freq: Every day | ORAL | Status: DC
  Administered 2022-07-31 – 2022-08-01 (×2): 12.5 mg via ORAL
  Filled 2022-07-30: qty 0.5
  Filled 2022-07-30: qty 1
  Filled 2022-07-30: qty 0.5
  Filled 2022-07-30: qty 1

## 2022-07-30 MED ORDER — SACUBITRIL-VALSARTAN 97-103 MG PO TABS
1.0000 | ORAL_TABLET | Freq: Two times a day (BID) | ORAL | Status: DC
  Filled 2022-07-30: qty 1

## 2022-07-30 MED ORDER — FUROSEMIDE 10 MG/ML IJ SOLN
40.0000 mg | Freq: Once | INTRAMUSCULAR | Status: AC
  Administered 2022-07-30: 40 mg via INTRAVENOUS
  Filled 2022-07-30: qty 4

## 2022-07-30 MED ORDER — FUROSEMIDE 10 MG/ML IJ SOLN: 40.0000 mg | Freq: Every day | INTRAMUSCULAR | Status: DC

## 2022-07-30 MED ORDER — MIRTAZAPINE 15 MG PO TABS: 30.0000 mg | ORAL_TABLET | Freq: Every day | ORAL | Status: DC

## 2022-07-30 MED ORDER — POTASSIUM CHLORIDE CRYS ER 20 MEQ PO TBCR
20.0000 meq | EXTENDED_RELEASE_TABLET | Freq: Every day | ORAL | Status: DC
  Administered 2022-07-31 – 2022-08-01 (×2): 20 meq via ORAL
  Filled 2022-07-30 (×2): qty 1

## 2022-07-30 MED ORDER — ACETAMINOPHEN 325 MG PO TABS: 650.0000 mg | ORAL_TABLET | Freq: Four times a day (QID) | ORAL | Status: DC | PRN

## 2022-07-30 MED ORDER — METHYLPREDNISOLONE SODIUM SUCC 125 MG IJ SOLR
125.0000 mg | Freq: Once | INTRAMUSCULAR | Status: AC
  Administered 2022-07-30: 125 mg via INTRAVENOUS
  Filled 2022-07-30: qty 2

## 2022-07-30 MED ORDER — QUETIAPINE FUMARATE 25 MG PO TABS: 25.0000 mg | ORAL_TABLET | Freq: Every day | ORAL | Status: DC

## 2022-07-30 NOTE — ED Provider Notes (Signed)
Hospitalist informed me the patient did not wish to be admitted.  I went and spoke to the patient personally, and informed him of my concerns and showed him his x-ray talk to him about his low oxygen saturations, and the need for admission.  After discussing this with him for about 10 minutes, he is understanding and agreeable with plan to proceed with admission.  I think initially he did not feel he needed to stay as he was starting to feel better, but did not fully grasp the gravity of his situation.  Having discussed with him and reviewed with him he is amenable to staying and being admitted.  I have repaged Dr. Francine Graven for admission   Delman Kitten, MD 07/30/22 [phone]

## 2022-07-30 NOTE — ED Notes (Signed)
Patient felt short of breath after accidentally pulling O2 off. Dr. Francine Graven aware.

## 2022-07-30 NOTE — Assessment & Plan Note (Addendum)
--  BP currently soft Plan: --cont aldactone --cont Toprol at reduced 50 mg daily --Hold home Entresto due to low BP

## 2022-07-30 NOTE — H&P (Addendum)
50 History and Physical    Patient: Ronald Horne HAL:[id number] DOB: 1951/08/24 DOA: 07/30/2022 DOS: the patient was seen and examined on 07/30/2022 PCP: Sherrie Mustache, MD  Patient coming from: Home  Chief Complaint:  Chief Complaint  Patient presents with   Respiratory Distress   HPI: Ronald Horne is a 71 y.o. male with medical history significant for chronic diastolic dysfunction CHF, GERD, hypertension, obesity, hyperthyroidism who presents to the ER for evaluation of  shortness of breath which initially started with exertion but is now short of breath at rest.  Shortness of breath associated with two-pillow orthopnea and PND.  He denies having any leg swelling.  Symptoms have been going on for over a week.  Room air pulse oximetry was 62% when he arrived to ER and he was placed on a BiPAP to improve oxygenation and reduce work of breathing. He has a cough that is nonproductive but denies having any fever or chills.  He has no leg swelling, no chest pain, no dizziness, no lightheadedness, no abdominal pain, no nausea, no vomiting, no blurred vision no focal deficit. He was placed on BiPAP transiently in the ER but was unable to tolerate it and so it was discontinued and patient placed on nasal cannula. Chest x-ray consistent with CHF Patient received a dose of Lasix in the ER with improvement in his symptoms and will be admitted to the hospital for further evaluation.   Review of Systems: As mentioned in the history of present illness. All other systems reviewed and are negative. Past Medical History:  Diagnosis Date   Arthritis    Cardiomyopathy (HCC)    a. 08/2016 Echo: EF 30-35%, diff HK; b. 03/2018 Ex MV (Jadali): Ex time 2:00, large anter defect, EF 24%; c. 03/2018 Echo: EF 20-25%, diff HK. Gr1 DD; d. Refused cath.   Chronic back pain    Chronic combined systolic (congestive) and diastolic (congestive) heart failure (HCC)    a. 08/2016 Echo: EF 30-35%, diff HK, mild to mod MR,  mild BAE; b. 03/2018 Echo: EF 20-25%, diff HK. Gr1 DD. MIldly dil Ao root. Mild BAE. Mildly reduced RV fxn.   GERD (gastroesophageal reflux disease)    Gout    Hypertension    Obesity    Past Surgical History:  Procedure Laterality Date   EPIDURAL BLOCK INJECTION     chronic back pain    LEFT HEART CATH AND CORONARY ANGIOGRAPHY Left 12/27/2019   Procedure: LEFT HEART CATH AND CORONARY ANGIOGRAPHY;  Surgeon: Laurier Nancy, MD;  Location: [facility] INVASIVE CV LAB;  Service: Cardiovascular;  Laterality: Left;   TONSILLECTOMY     Social History:  reports that he quit smoking about 45 years ago. His smoking use included cigarettes. He has a 3.00 pack-year smoking history. He has never used smokeless tobacco. He reports that he does not drink alcohol and does not use drugs.  No Known Allergies  No family history on file.  Prior to Admission medications   Medication Sig Start Date End Date Taking? Authorizing Provider  ARIPiprazole (ABILIFY) 5 MG tablet Take 5 mg by mouth daily.    [provider]  aspirin 81 MG tablet Take 81 mg by mouth daily.    [provider]  atorvastatin (LIPITOR) 10 MG tablet Take 10 mg by mouth daily.    [provider]  furosemide (LASIX) 40 MG tablet Take 1 tablet (40 mg total) by mouth daily. 04/12/18 07/11/18  Iran Ouch, MD  lisinopril (PRINIVIL,ZESTRIL) 40  MG tablet Take 40 mg by mouth daily.    [provider]  methimazole (TAPAZOLE) 5 MG tablet Take 5 mg by mouth daily.    [provider]  metoprolol succinate (TOPROL-XL) 100 MG 24 hr tablet Take 100 mg by mouth daily. Take with or immediately following a meal.    [provider]  metoprolol tartrate (LOPRESSOR) 25 MG tablet Take 100 mg by mouth 2 (two) times daily.     [provider]  mirtazapine (REMERON) 30 MG tablet Take 30 mg by mouth at bedtime.    [provider]  naproxen (NAPROSYN) 500 MG tablet Take 500 mg by mouth every 8  (eight) hours as needed for mild pain.     [provider]  potassium chloride SA (K-DUR,KLOR-CON) 20 MEQ tablet Take 1 tablet (20 mEq total) by mouth daily. 04/12/18   Iran Ouch, MD  QUEtiapine (SEROQUEL) 25 MG tablet Take 25 mg by mouth at bedtime.    [provider]    Physical Exam: Vitals:   07/30/22 1245 07/30/22 1300 07/30/22 1400 07/30/22 1430  BP:   100/73 97/67  Pulse: 69 70 76 73  Resp: (!) 24 19 13  (!) 22  Temp:      TempSrc:      SpO2: 95% 97% 95% 95%  Weight:      Height:       Physical Exam Vitals and nursing note reviewed.  Constitutional:      Appearance: He is obese.  HENT:     Head: Normocephalic and atraumatic.     Nose: Nose normal.     Mouth/Throat:     Mouth: Mucous membranes are moist.  Eyes:     Conjunctiva/sclera: Conjunctivae normal.  Cardiovascular:     Rate and Rhythm: Normal rate and regular rhythm.  Pulmonary:     Breath sounds: Rales present.  Abdominal:     General: Bowel sounds are normal.     Palpations: Abdomen is soft.     Comments: Central adiposity  Musculoskeletal:        General: Normal range of motion.     Cervical back: Normal range of motion and neck supple.  Skin:    General: Skin is warm and dry.  Neurological:     General: No focal deficit present.     Mental Status: He is alert.  Psychiatric:        Mood and Affect: Mood normal.        Behavior: Behavior normal.     Data Reviewed: Relevant notes from primary care and specialist visits, past discharge summaries as available in EHR, including Care Everywhere. Prior diagnostic testing as pertinent to current admission diagnoses Updated medications and problem lists for reconciliation ED course, including vitals, labs, imaging, treatment and response to treatment Triage notes, nursing and pharmacy notes and ED provider's notes Notable results as noted in HPI Labs reviewed.  Troponin 28 >> 49, sodium 137, potassium 3.4, chloride 108, bicarb  19, glucose 212, BUN 12, creatinine 1.10, calcium 8.3, total protein 6.6, albumin 3.4, AST 108, ALT 67, alkaline phosphatase 79, total bilirubin 0.8, BNP 591, white count 11.3, hemoglobin 15.8, hematocrit 51, platelet count 210 Respiratory viral panel is negative Chest x-ray reviewed by me shows cardiomegaly with pulmonary vascular congestion Twelve-lead EKG reviewed by me shows sinus tachycardia There are no new results to review at this time.  Assessment and Plan: * Acute on chronic systolic CHF (congestive heart failure) (HCC) Patient presents for  evaluation of worsening shortness of breath associated orthopnea and PND Was noted to be hypoxic upon arrival to the ER with room air pulse oximetry in the 60s and required BiPAP transiently Patient was transitioned to nasal cannula because he was unable to tolerate the BiPAP Chest x-ray shows findings consistent with CHF and BNP was elevated Last 2D echocardiogram from 2019 showed an LVEF of 20% but patient had an angiogram in 2021 which showed mild LV dysfunction but no documented LVEF. We will repeat 2D echocardiogram Place patient on Lasix 40 mg IV daily Continue metoprolol, Entresto, spironolactone and Corlanor Consult cardiology  Elevated troponin Patient noted to have elevated troponin levels most likely related to demand ischemia from tachycardia and acute CHF. Follow-up results of 2D echocardiogram to rule out wall motion abnormality  Hyperglycemia No known history of diabetes mellitus Will obtain hemoglobin A1c Maintain consistent carbohydrate diet Check blood sugars with meals  Hypokalemia Secondary to diuretic use Supplement potassium  Hyperthyroidism Continue methimazole  GERD (gastroesophageal reflux disease) Stable Continue PPI  Hypertension Continue metoprolol and lisinopril  Obesity BMI 29.4 Complicates overall prognosis and care Lifestyle modification and exercise discussed with patient in detail       Advance Care Planning:   Code Status: Full Code   Consults: Cardiology  Family Communication: Greater than 50% of time was spent discussing patient's condition and plan with him at the bedside.  All questions and concerns have been addressed.  He verbalizes understanding and agrees with the plan  Severity of Illness: The appropriate patient status for this patient is INPATIENT. Inpatient status is judged to be reasonable and necessary in order to provide the required intensity of service to ensure the patient's safety. The patient's presenting symptoms, physical exam findings, and initial radiographic and laboratory data in the context of their chronic comorbidities is felt to place them at high risk for further clinical deterioration. Furthermore, it is not anticipated that the patient will be medically stable for discharge from the hospital within 2 midnights of admission.   * I certify that at the point of admission it is my clinical judgment that the patient will require inpatient hospital care spanning beyond 2 midnights from the point of admission due to high intensity of service, high risk for further deterioration and high frequency of surveillance required.*  Author: Collier Bullock, MD 07/30/2022 5:26 PM  For on call review [email or web address].

## 2022-07-30 NOTE — ED Notes (Signed)
Patient took off his O2 to eat. Patient was sitting on the end of the stretcher and was assisted to the side of stretcher. Patient became very short of breath. Patient was placed back on O2 and resumed eating.

## 2022-07-30 NOTE — ED Notes (Signed)
Report given to Julia and Taylor 

## 2022-07-30 NOTE — Assessment & Plan Note (Signed)
-  Continue methimazole ?

## 2022-07-30 NOTE — Assessment & Plan Note (Signed)
Continue Seroquel, Remeron and Abilify

## 2022-07-30 NOTE — ED Triage Notes (Signed)
Pt presents to triage in respiratory distress, Able to verbalize sick for about a week. Pt's oxygen saturation in triage 62% in RA.

## 2022-07-30 NOTE — ED Notes (Signed)
Report given to Alex RN.

## 2022-07-30 NOTE — Assessment & Plan Note (Addendum)
Patient presents for evaluation of worsening shortness of breath associated orthopnea and PND Was noted to be hypoxic upon arrival to the ER with room air pulse oximetry in the 60s and required BiPAP transiently. Chest x-ray shows findings consistent with CHF and BNP was elevated Last 2D echocardiogram from 2019 showed an LVEF of 20% but cardiac catheterization done in 2021 showed normal coronaries with ejection fraction at that time 45% --follows with Dr. Humphrey Rolls, who was consulted Plan: --cont IV lasix 40 BID --cont aldactone --cont Toprol at reduced 50 mg daily --cont ivabradine --Hold home Entresto due to low BP --Echo

## 2022-07-30 NOTE — ED Notes (Signed)
Patient is resting on ED stretcher. Patient asked for lunch tray.

## 2022-07-30 NOTE — Assessment & Plan Note (Signed)
Stable.  Continue PPI. 

## 2022-07-30 NOTE — ED Provider Notes (Signed)
[facility] Provider Note    Event Date/Time   First MD Initiated Contact with Patient 07/30/22 0319     (approximate)   History   Respiratory Distress   HPI  Level V caveat: Limited by respiratory distress  Ronald Horne is a 71 y.o. male who presents to the ED from home with a chief complaint of respiratory distress.  History of CHF.  States he has been sick for about 1 week with coughing and congestion.  Patient was hypoxic room air saturation 62% in triage and brought immediately back to a treatment room where he was placed on nonrebreather oxygen.  Rest of history is limited secondary to distress.     Past Medical History   Past Medical History:  Diagnosis Date   Arthritis    Cardiomyopathy (HCC)    a. 08/2016 Echo: EF 30-35%, diff HK; b. 03/2018 Ex MV (Jadali): Ex time 2:00, large anter defect, EF 24%; c. 03/2018 Echo: EF 20-25%, diff HK. Gr1 DD; d. Refused cath.   Chronic back pain    Chronic combined systolic (congestive) and diastolic (congestive) heart failure (HCC)    a. 08/2016 Echo: EF 30-35%, diff HK, mild to mod MR, mild BAE; b. 03/2018 Echo: EF 20-25%, diff HK. Gr1 DD. MIldly dil Ao root. Mild BAE. Mildly reduced RV fxn.   GERD (gastroesophageal reflux disease)    Gout    Hypertension    Obesity      Active Problem List   Patient Active Problem List   Diagnosis Date Noted   Acute coronary syndrome (HCC) 11/28/2019     Past Surgical History   Past Surgical History:  Procedure Laterality Date   EPIDURAL BLOCK INJECTION     chronic back pain    LEFT HEART CATH AND CORONARY ANGIOGRAPHY Left 12/27/2019   Procedure: LEFT HEART CATH AND CORONARY ANGIOGRAPHY;  Surgeon: Laurier Nancy, MD;  Location: [facility] INVASIVE CV LAB;  Service: Cardiovascular;  Laterality: Left;   TONSILLECTOMY       Home Medications   Prior to Admission medications   Medication Sig Start Date End Date Taking? Authorizing Provider  ARIPiprazole (ABILIFY)  5 MG tablet Take 5 mg by mouth daily.    [provider]  aspirin 81 MG tablet Take 81 mg by mouth daily.    [provider]  atorvastatin (LIPITOR) 10 MG tablet Take 10 mg by mouth daily.    [provider]  furosemide (LASIX) 40 MG tablet Take 1 tablet (40 mg total) by mouth daily. 04/12/18 07/11/18  Iran Ouch, MD  lisinopril (PRINIVIL,ZESTRIL) 40 MG tablet Take 40 mg by mouth daily.    [provider]  methimazole (TAPAZOLE) 5 MG tablet Take 5 mg by mouth daily.    [provider]  metoprolol succinate (TOPROL-XL) 100 MG 24 hr tablet Take 100 mg by mouth daily. Take with or immediately following a meal.    [provider]  metoprolol tartrate (LOPRESSOR) 25 MG tablet Take 100 mg by mouth 2 (two) times daily.     [provider]  mirtazapine (REMERON) 30 MG tablet Take 30 mg by mouth at bedtime.    [provider]  naproxen (NAPROSYN) 500 MG tablet Take 500 mg by mouth every 8 (eight) hours as needed for mild pain.     [provider]  potassium chloride SA (K-DUR,KLOR-CON) 20 MEQ tablet Take 1 tablet (20 mEq total) by mouth daily. 04/12/18   Iran Ouch,  MD  QUEtiapine (SEROQUEL) 25 MG tablet Take 25 mg by mouth at bedtime.    [provider]     Allergies  Patient has no known allergies.   Family History  No family history on file.   Physical Exam  Triage Vital Signs: ED Triage Vitals  Enc Vitals Group     BP      Pulse      Resp      Temp      Temp src      SpO2      Weight      Height      Head Circumference      Peak Flow      Pain Score      Pain Loc      Pain Edu?      Excl. in [place]?     Updated Vital Signs: BP (!) 120/95   Pulse 89   Temp 97.8 F (36.6 C)   Resp 20   Ht 6\' 2"  (1.88 m)   Wt (!) 148 kg   SpO2 94%   BMI 41.89 kg/m    General: Awake, moderate to severe distress.  CV:  Tachycardic.  Good peripheral perfusion.  Resp:  Increased effort.   Faint bibasilar rales.  Audible wheezing. Abd:  Obese, nontender.  No distention.  Other:  Bilateral calves are nontender and nonswollen.   ED Results / Procedures / Treatments  Labs (all labs ordered are listed, but only abnormal results are displayed) Labs Reviewed  CBC WITH DIFFERENTIAL/PLATELET - Abnormal; Notable for the following components:      Result Value   WBC 11.3 (*)    Lymphs Abs 5.2 (*)    Monocytes Absolute 1.1 (*)    All other components within normal limits  BRAIN NATRIURETIC PEPTIDE - Abnormal; Notable for the following components:   B Natriuretic Peptide 591.4 (*)    All other components within normal limits  BLOOD GAS, ARTERIAL - Abnormal; Notable for the following components:   pH, Arterial 7.27 (*)    pO2, Arterial 155 (*)    Bicarbonate 16.1 (*)    Acid-base deficit 9.9 (*)    All other components within normal limits  COMPREHENSIVE METABOLIC PANEL - Abnormal; Notable for the following components:   Potassium 3.4 (*)    CO2 19 (*)    Glucose, Bld 212 (*)    Calcium 8.3 (*)    Albumin 3.4 (*)    AST 108 (*)    ALT 67 (*)    All other components within normal limits  TROPONIN I (HIGH SENSITIVITY) - Abnormal; Notable for the following components:   Troponin I (High Sensitivity) 28 (*)    All other components within normal limits  RESP PANEL BY RT-PCR (FLU A&B, COVID) ARPGX2  TROPONIN I (HIGH SENSITIVITY)     EKG  ED ECG REPORT I, Nona Gracey J, the attending physician, personally viewed and interpreted this ECG.   Date: 07/30/2022  EKG Time: 0323  Rate: 145  Rhythm: sinus tachycardia  Axis: LAD  Intervals:nonspecific intraventricular conduction delay  ST&T Change: Lateral T wave inversions    RADIOLOGY I have independently visualized and interpreted patient's chest xray well as noted the radiology interpretation:  Chest x-ray: CHF  Official radiology report(s): DG Chest Port 1 View  Result Date: 07/30/2022 CLINICAL DATA:  Shortness of  breath EXAM: PORTABLE CHEST 1 VIEW COMPARISON:  10/19/2021 FINDINGS: Cardiomegaly and vascular pedicle widening with diffuse congested appearance of  vessels. No visible effusion or pneumothorax. Artifact from EKG leads. IMPRESSION: CHF pattern. Electronically Signed   By: Tiburcio Pea M.D.   On: 07/30/2022 04:13     PROCEDURES:  Critical Care performed: Yes, see critical care procedure note(s)  CRITICAL CARE Performed by: Irean Hong   Total critical care time: 45 minutes  Critical care time was exclusive of separately billable procedures and treating other patients.  Critical care was necessary to treat or prevent imminent or life-threatening deterioration.  Critical care was time spent personally by me on the following activities: development of treatment plan with patient and/or surrogate as well as nursing, discussions with consultants, evaluation of patient's response to treatment, examination of patient, obtaining history from patient or surrogate, ordering and performing treatments and interventions, ordering and review of laboratory studies, ordering and review of radiographic studies, pulse oximetry and re-evaluation of patient's condition.   Marland Kitchen1-3 Lead EKG Interpretation  Performed by: Irean Hong, MD Authorized by: Irean Hong, MD     Interpretation: abnormal     ECG rate:  145   ECG rate assessment: tachycardic     Rhythm: sinus tachycardia     Ectopy: none     Conduction: normal   Comments:     Patient placed on cardiac monitor to evaluate for arrhythmias    MEDICATIONS ORDERED IN ED: Medications  ipratropium-albuterol (DUONEB) 0.5-2.5 (3) MG/3ML nebulizer solution 3 mL (3 mLs Nebulization Given 07/30/22 0333)  methylPREDNISolone sodium succinate (SOLU-MEDROL) 125 mg/2 mL injection 125 mg (125 mg Intravenous Given 07/30/22 0332)  furosemide (LASIX) injection 40 mg (40 mg Intravenous Given 07/30/22 0441)     IMPRESSION / MDM / ASSESSMENT AND PLAN / ED COURSE   I reviewed the triage vital signs and the nursing notes.                             71 year old male presenting in respiratory distress with hypoxia. Differential includes, but is not limited to, viral syndrome, bronchitis including COPD exacerbation, pneumonia, reactive airway disease including asthma, CHF including exacerbation with or without pulmonary/interstitial edema, pneumothorax, ACS, thoracic trauma, and pulmonary embolism.  I have personally reviewed patient's records and note an orthopedics office visit from 07/13/2021 for left shoulder pain.  Patient's presentation is most consistent with acute presentation with potential threat to life or bodily function.  The patient is on the cardiac monitor to evaluate for evidence of arrhythmia and/or significant heart rate changes.  We will place patient urgently on BiPAP.  Obtain cardiac panel to include BNP, chest x-ray.  Administer IV Solu-Medrol and DuoNeb for audible wheezing on exam.  Anticipate hospitalization.  Clinical Course as of 07/30/22 0608  Sat Jul 30, 2022  0421 ABG 7.2 7/35/155.  Patient is not tolerating BiPAP particularly well so RT will trial nasal cannula again. [JS]  [id number] Patient doing fine on nasal cannula oxygen.  Will discuss with hospitalist services for evaluation and admission.  Initial troponin is 28, BNP 591 [JS]    Clinical Course User Index [JS] Irean Hong, MD     FINAL CLINICAL IMPRESSION(S) / ED DIAGNOSES   Final diagnoses:  Acute respiratory failure with hypoxia (HCC)  Acute on chronic congestive heart failure, unspecified heart failure type (HCC)     Rx / DC Orders   ED Discharge Orders     None        Note:  This document was prepared using Dragon voice  recognition software and may include unintentional dictation errors.   Irean Hong, MD 07/30/22 [phone]

## 2022-07-30 NOTE — Assessment & Plan Note (Signed)
Secondary to diuretic use Supplement potassium 

## 2022-07-30 NOTE — Assessment & Plan Note (Addendum)
No known history of diabetes mellitus Will obtain hemoglobin A1c

## 2022-07-30 NOTE — ED Notes (Signed)
Pt is resting comfortably and has no requested needs at this time.

## 2022-07-30 NOTE — Consult Note (Signed)
Ronald Horne is a 71 y.o. male  [id number]  Primary Cardiologist: Adrian Blackwater Reason for Consultation: CHF  HPI: This is 19 year old African-American male who is well-known patient to me presented to the hospital with respiratory distress.  Patient was admitted last night because of shortness of breath orthopnea PND leg swelling.  He received IV Lasix and is somewhat better now.   Review of Systems: No chest pain   Past Medical History:  Diagnosis Date   Arthritis    Cardiomyopathy (HCC)    a. 08/2016 Echo: EF 30-35%, diff HK; b. 03/2018 Ex MV (Jadali): Ex time 2:00, large anter defect, EF 24%; c. 03/2018 Echo: EF 20-25%, diff HK. Gr1 DD; d. Refused cath.   Chronic back pain    Chronic combined systolic (congestive) and diastolic (congestive) heart failure (HCC)    a. 08/2016 Echo: EF 30-35%, diff HK, mild to mod MR, mild BAE; b. 03/2018 Echo: EF 20-25%, diff HK. Gr1 DD. MIldly dil Ao root. Mild BAE. Mildly reduced RV fxn.   GERD (gastroesophageal reflux disease)    Gout    Hypertension    Obesity     (Not in a hospital admission)     ARIPiprazole  5 mg Oral Daily   aspirin EC  81 mg Oral Daily   atorvastatin  10 mg Oral QPM   enoxaparin (LOVENOX) injection  0.5 mg/kg Subcutaneous Q24H   [START ON 07/31/2022] furosemide  40 mg Intravenous Daily   [START ON 07/31/2022] lisinopril  40 mg Oral Daily   methimazole  7.5 mg Oral Daily   [START ON 07/31/2022] metoprolol succinate  100 mg Oral Daily   mirtazapine  30 mg Oral QHS   potassium chloride SA  20 mEq Oral Daily   potassium chloride  40 mEq Oral Once   QUEtiapine  25 mg Oral QHS    Infusions:   No Known Allergies  Social History   Socioeconomic History   Marital status: Single    Spouse name: Not on file   Number of children: Not on file   Years of education: Not on file   Highest education level: Not on file  Occupational History   Not on file  Tobacco Use   Smoking status: Former    Packs/day: 3.00     Years: 1.00    Total pack years: 3.00    Types: Cigarettes    Quit date: 12/12/1976    Years since quitting: 45.6   Smokeless tobacco: Never  Vaping Use   Vaping Use: Never used  Substance and Sexual Activity   Alcohol use: No   Drug use: No   Sexual activity: Not on file  Other Topics Concern   Not on file  Social History Narrative   Not on file   Social Determinants of Health   Financial Resource Strain: Not on file  Food Insecurity: Not on file  Transportation Needs: Not on file  Physical Activity: Not on file  Stress: Not on file  Social Connections: Not on file  Intimate Partner Violence: Not on file    No family history on file.  PHYSICAL EXAM: Vitals:   07/30/22 1130 07/30/22 1200  BP: 109/72 119/75  Pulse: 75 83  Resp: 15 (!) 43  Temp:    SpO2: 94% 97%     Intake/Output Summary (Last 24 hours) at 07/30/2022 1243 Last data filed at 07/30/2022 1700 Gross per 24 hour  Intake --  Output 2000 ml  Net -2000 ml  General:  Well appearing. No respiratory difficulty HEENT: normal Neck: supple. no JVD. Carotids 2+ bilat; no bruits. No lymphadenopathy or thryomegaly appreciated. Cor: PMI nondisplaced. Regular rate & rhythm. No rubs, gallops or murmurs. Lungs: clear Abdomen: soft, nontender, nondistended. No hepatosplenomegaly. No bruits or masses. Good bowel sounds. Extremities: no cyanosis, clubbing, rash, edema Neuro: alert & oriented x 3, cranial nerves grossly intact. moves all 4 extremities w/o difficulty. Affect pleasant.  ECG: Sinus tachycardia with nonspecific intraventricular conduction delay and nonspecific ST-T changes  Results for orders placed or performed during the hospital encounter of 07/30/22 (from the past 24 hour(s))  CBC with Differential     Status: Abnormal   Collection Time: 07/30/22  3:25 AM  Result Value Ref Range   WBC 11.3 (H) 4.0 - 10.5 K/uL   RBC 5.21 4.22 - 5.81 MIL/uL   Hemoglobin 15.8 13.0 - 17.0 g/dL   HCT 51.0 39.0 -  52.0 %   MCV 97.9 80.0 - 100.0 fL   MCH 30.3 26.0 - 34.0 pg   MCHC 31.0 30.0 - 36.0 g/dL   RDW 14.4 11.5 - 15.5 %   Platelets 210 150 - 400 K/uL   nRBC 0.0 0.0 - 0.2 %   Neutrophils Relative % 43 %   Neutro Abs 4.9 1.7 - 7.7 K/uL   Lymphocytes Relative 46 %   Lymphs Abs 5.2 (H) 0.7 - 4.0 K/uL   Monocytes Relative 10 %   Monocytes Absolute 1.1 (H) 0.1 - 1.0 K/uL   Eosinophils Relative 1 %   Eosinophils Absolute 0.1 0.0 - 0.5 K/uL   Basophils Relative 0 %   Basophils Absolute 0.1 0.0 - 0.1 K/uL   Immature Granulocytes 0 %   Abs Immature Granulocytes 0.04 0.00 - 0.07 K/uL  Brain natriuretic peptide     Status: Abnormal   Collection Time: 07/30/22  3:25 AM  Result Value Ref Range   B Natriuretic Peptide 591.4 (H) 0.0 - 100.0 pg/mL  Troponin I (High Sensitivity)     Status: Abnormal   Collection Time: 07/30/22  3:25 AM  Result Value Ref Range   Troponin I (High Sensitivity) 28 (H) <18 ng/L  Resp Panel by RT-PCR (Flu A&B, Covid) Anterior Nasal Swab     Status: None   Collection Time: 07/30/22  3:25 AM   Specimen: Anterior Nasal Swab  Result Value Ref Range   SARS Coronavirus 2 by RT PCR NEGATIVE NEGATIVE   Influenza A by PCR NEGATIVE NEGATIVE   Influenza B by PCR NEGATIVE NEGATIVE  Blood gas, arterial     Status: Abnormal   Collection Time: 07/30/22  3:26 AM  Result Value Ref Range   FIO2 100 %   Delivery systems NON-REBREATHER OXYGEN MASK    pH, Arterial 7.27 (L) 7.35 - 7.45   pCO2 arterial 35 32 - 48 mmHg   pO2, Arterial 155 (H) 83 - 108 mmHg   Bicarbonate 16.1 (L) 20.0 - 28.0 mmol/L   Acid-base deficit 9.9 (H) 0.0 - 2.0 mmol/L   O2 Saturation 100 %   Patient temperature 37.0    Collection site LEFT RADIAL    Allens test (pass/fail) PASS PASS  Comprehensive metabolic panel     Status: Abnormal   Collection Time: 07/30/22  4:21 AM  Result Value Ref Range   Sodium 137 135 - 145 mmol/L   Potassium 3.4 (L) 3.5 - 5.1 mmol/L   Chloride 108 98 - 111 mmol/L   CO2 19 (L) 22 -  32  mmol/L   Glucose, Bld 212 (H) 70 - 99 mg/dL   BUN 12 8 - 23 mg/dL   Creatinine, Ser 7.25 0.61 - 1.24 mg/dL   Calcium 8.3 (L) 8.9 - 10.3 mg/dL   Total Protein 6.6 6.5 - 8.1 g/dL   Albumin 3.4 (L) 3.5 - 5.0 g/dL   AST 366 (H) 15 - 41 U/L   ALT 67 (H) 0 - 44 U/L   Alkaline Phosphatase 79 38 - 126 U/L   Total Bilirubin 0.8 0.3 - 1.2 mg/dL   GFR, Estimated >44 >03 mL/min   Anion gap 10 5 - 15  Troponin I (High Sensitivity)     Status: Abnormal   Collection Time: 07/30/22  5:40 AM  Result Value Ref Range   Troponin I (High Sensitivity) 49 (H) <18 ng/L   DG Chest Port 1 View  Result Date: 07/30/2022 CLINICAL DATA:  Shortness of breath EXAM: PORTABLE CHEST 1 VIEW COMPARISON:  10/19/2021 FINDINGS: Cardiomegaly and vascular pedicle widening with diffuse congested appearance of vessels. No visible effusion or pneumothorax. Artifact from EKG leads. IMPRESSION: CHF pattern. Electronically Signed   By: Tiburcio Pea M.D.   On: 07/30/2022 04:13     ASSESSMENT AND PLAN: Congestive heart failure due to HFrEF with ejection fraction 35% and cardiac catheterization done in 2021 showed normal coronaries with ejection fraction at that time 45% on cardiac cath.  Patient is on maximum dose of Entresto, Farxiga and Corlanor and 300 mg of metoprolol.  Patient been on maximal medical therapy and still decompensated.  Advise giving the patient IV Lasix and continue rest of his medications.  We will repeat his echocardiogram.  Zacherie Honeyman A

## 2022-07-30 NOTE — Assessment & Plan Note (Addendum)
2/2 demand ischemia from CHF exacerbation. --trop peaked at 11

## 2022-07-30 NOTE — ED Notes (Signed)
Patient states he feels better after the neb treatment.

## 2022-07-30 NOTE — Assessment & Plan Note (Signed)
BMI 01.7 Complicates overall prognosis and care Lifestyle modification and exercise discussed with patient in detail

## 2022-07-31 ENCOUNTER — Other Ambulatory Visit: Payer: Self-pay

## 2022-07-31 ENCOUNTER — Inpatient Hospital Stay
Admit: 2022-07-31 | Discharge: 2022-07-31 | Disposition: A | Discharge status: Home / Self Care | Payer: Medicare HMO | Attending: Internal Medicine | Admitting: Internal Medicine

## 2022-07-31 ENCOUNTER — Encounter: Payer: Self-pay | Admitting: Internal Medicine

## 2022-07-31 DIAGNOSIS — J9601 Acute respiratory failure with hypoxia: Principal | ICD-10-CM

## 2022-07-31 DIAGNOSIS — I5023 Acute on chronic systolic (congestive) heart failure: Secondary | ICD-10-CM | POA: Diagnosis not present

## 2022-07-31 LAB — CBC
HCT: 43.2 % (ref 39.0–52.0)
Hemoglobin: 13.6 g/dL (ref 13.0–17.0)
MCH: 29.8 pg (ref 26.0–34.0)
MCHC: 31.5 g/dL (ref 30.0–36.0)
MCV: 94.5 fL (ref 80.0–100.0)
Platelets: 153 10*3/uL (ref 150–400)
RBC: 4.57 MIL/uL (ref 4.22–5.81)
RDW: 14.6 % (ref 11.5–15.5)
WBC: 13.6 10*3/uL — ABNORMAL HIGH (ref 4.0–10.5)
nRBC: 0 % (ref 0.0–0.2)

## 2022-07-31 LAB — BASIC METABOLIC PANEL
Anion gap: 4 — ABNORMAL LOW (ref 5–15)
BUN: 18 mg/dL (ref 8–23)
CO2: 24 mmol/L (ref 22–32)
Calcium: 8.3 mg/dL — ABNORMAL LOW (ref 8.9–10.3)
Chloride: 112 mmol/L — ABNORMAL HIGH (ref 98–111)
Creatinine, Ser: 1.04 mg/dL (ref 0.61–1.24)
GFR, Estimated: >60 mL/min (ref >60)
Glucose, Bld: 126 mg/dL — ABNORMAL HIGH (ref 70–99)
Potassium: 4.6 mmol/L (ref 3.5–5.1)
Sodium: 140 mmol/L (ref 135–145)

## 2022-07-31 LAB — ECHOCARDIOGRAM COMPLETE
AR max vel: 2.73 cm2
AV Area VTI: 2.66 cm2
AV Area mean vel: 2.63 cm2
AV Mean grad: 3 mmHg
AV Peak grad: 6.2 mmHg
Ao pk vel: 1.24 m/s
Area-P 1/2: 5.62 cm2
Calc EF: 16.9 %
Height: 74 in
S' Lateral: 4.9 cm
Single Plane A2C EF: 16.9 %
Single Plane A4C EF: 21.4 %
Weight: 5220.49 oz

## 2022-07-31 LAB — HEMOGLOBIN A1C
Hgb A1c MFr Bld: 5.8 % — ABNORMAL HIGH (ref 4.8–5.6)
Mean Plasma Glucose: 119.76 mg/dL

## 2022-07-31 LAB — GLUCOSE, CAPILLARY
Glucose-Capillary: 107 mg/dL — ABNORMAL HIGH (ref 70–99)
Glucose-Capillary: 100 mg/dL — ABNORMAL HIGH (ref 70–99)
Glucose-Capillary: 127 mg/dL — ABNORMAL HIGH (ref 70–99)

## 2022-07-31 LAB — HIV ANTIBODY (ROUTINE TESTING W REFLEX): HIV Screen 4th Generation wRfx: NONREACTIVE

## 2022-07-31 LAB — TROPONIN I (HIGH SENSITIVITY): Troponin I (High Sensitivity): 55 ng/L — ABNORMAL HIGH (ref <18)

## 2022-07-31 MED ORDER — METOPROLOL SUCCINATE ER 50 MG PO TB24
50.0000 mg | ORAL_TABLET | Freq: Every day | ORAL | Status: DC
  Administered 2022-07-31 – 2022-08-01 (×2): 50 mg via ORAL
  Filled 2022-07-31 (×2): qty 1

## 2022-07-31 MED ORDER — PERFLUTREN LIPID MICROSPHERE
1.0000 mL | INTRAVENOUS | Status: AC | PRN
  Administered 2022-07-31: 2 mL via INTRAVENOUS

## 2022-07-31 MED ORDER — FUROSEMIDE 10 MG/ML IJ SOLN
40.0000 mg | Freq: Two times a day (BID) | INTRAMUSCULAR | Status: DC
  Administered 2022-07-31 – 2022-08-01 (×3): 40 mg via INTRAVENOUS
  Filled 2022-07-31 (×3): qty 4

## 2022-07-31 NOTE — Progress Notes (Signed)
*  PRELIMINARY RESULTS* Echocardiogram 2D Echocardiogram has been performed.  Ronald Horne 07/31/2022, 10:29 AM

## 2022-07-31 NOTE — ED Notes (Signed)
Advised nurse that patient has ready bed 

## 2022-07-31 NOTE — Assessment & Plan Note (Signed)
2/2 pulm edema and vascular congestion from CHF exacerbation --Room air pulse oximetry was 62% when he arrived to ER and he was placed on a BiPAP briefly then transitioned to [place] --currently on 2L --Continue supplemental O2 to keep sats >=90%, wean as tolerated

## 2022-07-31 NOTE — Progress Notes (Signed)
SUBJECTIVE: Feeling much better   Vitals:   07/31/22 0305 07/31/22 0802 07/31/22 0824 07/31/22 1216  BP: 100/60 (!) 92/55  102/73  Pulse: 66 74  78  Resp: 20 20  18   Temp: 98.2 F (36.8 C)  (!) 97.4 F (36.3 C) (!) 97.4 F (36.3 C)  TempSrc: Oral  Oral Oral  SpO2: 96% 100%  98%  Weight:      Height:        Intake/Output Summary (Last 24 hours) at 07/31/2022 1217 Last data filed at 07/31/2022 1209 Gross per 24 hour  Intake --  Output 1400 ml  Net -1400 ml    LABS: Basic Metabolic Panel: Recent Labs    07/30/22 0421 07/31/22 0444  NA 137 140  K 3.4* 4.6  CL 108 112*  CO2 19* 24  GLUCOSE 212* 126*  BUN 12 18  CREATININE 1.10 1.04  CALCIUM 8.3* 8.3*   Liver Function Tests: Recent Labs    07/30/22 0421  AST 108*  ALT 67*  ALKPHOS 79  BILITOT 0.8  PROT 6.6  ALBUMIN 3.4*   No results for input(s): "LIPASE", "AMYLASE" in the last 72 hours. CBC: Recent Labs    07/30/22 0325 07/31/22 0444  WBC 11.3* 13.6*  NEUTROABS 4.9  --   HGB 15.8 13.6  HCT 51.0 43.2  MCV 97.9 94.5  PLT 210 153   Cardiac Enzymes: No results for input(s): "CKTOTAL", "CKMB", "CKMBINDEX", "TROPONINI" in the last 72 hours. BNP: Invalid input(s): "POCBNP" D-Dimer: No results for input(s): "DDIMER" in the last 72 hours. Hemoglobin A1C: No results for input(s): "HGBA1C" in the last 72 hours. Fasting Lipid Panel: No results for input(s): "CHOL", "HDL", "LDLCALC", "TRIG", "CHOLHDL", "LDLDIRECT" in the last 72 hours. Thyroid Function Tests: Recent Labs    07/30/22 1858  TSH 0.376   Anemia Panel: No results for input(s): "VITAMINB12", "FOLATE", "FERRITIN", "TIBC", "IRON", "RETICCTPCT" in the last 72 hours.   PHYSICAL EXAM General: Well developed, well nourished, in no acute distress HEENT:  Normocephalic and atramatic Neck:  No JVD.  Lungs: Clear bilaterally to auscultation and percussion. Heart: HRRR . Normal S1 and S2 without gallops or murmurs.  Abdomen: Bowel sounds are  positive, abdomen soft and non-tender  Msk:  Back normal, normal gait. Normal strength and tone for age. Extremities: No clubbing, cyanosis or edema.   Neuro: Alert and oriented X 3. Psych:  Good affect, responds appropriately  TELEMETRY: Sinus rhythm  ASSESSMENT AND PLAN: HFrEF, during diuresis patient has become hypotensive but is feeling much better.  May have to start Entresto, Aldactone, Farxiga, Corlanor, metoprolol which she takes at home at some point.  Principal Problem:   Acute on chronic systolic CHF (congestive heart failure) (HCC) Active Problems:   Obesity   Hypertension   GERD (gastroesophageal reflux disease)   Hyperthyroidism   Hypokalemia   Hyperglycemia   Elevated troponin    Neoma Laming A, MD, [facility] 07/31/2022 12:17 PM

## 2022-07-31 NOTE — Progress Notes (Signed)
PROGRESS NOTE    Ronald Horne  EQA:[id number] DOB: 02/19/1951 DOA: 07/30/2022 PCP: Sherrie Mustache, MD  [id number]  LOS: 1 day   Brief hospital course:   Assessment & Plan: Ronald Horne is a 71 y.o. male with medical history significant for systolic CHF, hypertension, obesity, hyperthyroidism who presented to the ER for evaluation of shortness of breath which initially started with exertion but is now short of breath at rest.  Shortness of breath associated with two-pillow orthopnea and PND.    * Acute on chronic systolic CHF (congestive heart failure) (HCC) Patient presents for evaluation of worsening shortness of breath associated orthopnea and PND Was noted to be hypoxic upon arrival to the ER with room air pulse oximetry in the 60s and required BiPAP transiently. Chest x-ray shows findings consistent with CHF and BNP was elevated Last 2D echocardiogram from 2019 showed an LVEF of 20% but cardiac catheterization done in 2021 showed normal coronaries with ejection fraction at that time 45% --follows with Dr. Welton Flakes, who was consulted Plan: --cont IV lasix 40 BID --cont aldactone --cont Toprol at reduced 50 mg daily --cont ivabradine --Hold home Entresto due to low BP --Echo  Acute respiratory failure with hypoxia (HCC) 2/2 pulm edema and vascular congestion from CHF exacerbation --Room air pulse oximetry was 62% when he arrived to ER and he was placed on a BiPAP briefly then transitioned to [place] --currently on 2L --Continue supplemental O2 to keep sats >=90%, wean as tolerated  Elevated troponin 2/2 demand ischemia from CHF exacerbation. --trop peaked at 10  Hyperglycemia No known history of diabetes mellitus Will obtain hemoglobin A1c  Hypokalemia Secondary to diuretic use Supplement potassium  Hyperthyroidism Continue methimazole  GERD (gastroesophageal reflux disease) Stable Continue PPI  Hypertension --BP currently soft Plan: --cont aldactone --cont  Toprol at reduced 50 mg daily --Hold home Entresto due to low BP   Obesity BMI 41.8 Complicates overall prognosis and care Lifestyle modification and exercise discussed with patient in detail   DVT prophylaxis: Lovenox SQ Code Status: Full code  Family Communication:  Level of care: Progressive Dispo:   The patient is from: home Anticipated d/c is to: home Anticipated d/c date is: 2-3 days Patient currently is not medically ready to d/c due to: on IV lasix   Subjective and Interval History:  Pt said his breathing improved.  No swelling.     Objective: Vitals:   07/31/22 0305 07/31/22 0802 07/31/22 0824 07/31/22 1216  BP: 100/60 (!) 92/55  102/73  Pulse: 66 74  78  Resp: 20 20  18   Temp: 98.2 F (36.8 C)  (!) 97.4 F (36.3 C) (!) 97.4 F (36.3 C)  TempSrc: Oral  Oral Oral  SpO2: 96% 100%  98%  Weight:      Height:        Intake/Output Summary (Last 24 hours) at 07/31/2022 1407 Last data filed at 07/31/2022 1209 Gross per 24 hour  Intake --  Output 1400 ml  Net -1400 ml   Filed Weights   07/30/22 0321 07/30/22 0438  Weight: (!) 158.8 kg (!) 148 kg    Examination:   Constitutional: NAD, AAOx3 HEENT: conjunctivae and lids normal, EOMI CV: No cyanosis.   RESP: normal respiratory effort, on 2L Extremities: No effusions, edema in BLE SKIN: warm, dry Neuro: II - XII grossly intact.   Psych: Normal mood and affect.  Appropriate judgement and reason   Data Reviewed: I have personally reviewed labs and imaging studies  Time spent: 50  minutes  Enzo Bi, MD [facility] If 7PM-7AM, please contact night-coverage 07/31/2022, 2:07 PM

## 2022-08-01 DIAGNOSIS — I5023 Acute on chronic systolic (congestive) heart failure: Secondary | ICD-10-CM | POA: Diagnosis not present

## 2022-08-01 LAB — BASIC METABOLIC PANEL
Anion gap: 6 (ref 5–15)
BUN: 24 mg/dL — ABNORMAL HIGH (ref 8–23)
CO2: 28 mmol/L (ref 22–32)
Calcium: 8.7 mg/dL — ABNORMAL LOW (ref 8.9–10.3)
Chloride: 106 mmol/L (ref 98–111)
Creatinine, Ser: 1.1 mg/dL (ref 0.61–1.24)
GFR, Estimated: >60 mL/min (ref >60)
Glucose, Bld: 109 mg/dL — ABNORMAL HIGH (ref 70–99)
Potassium: 3.8 mmol/L (ref 3.5–5.1)
Sodium: 140 mmol/L (ref 135–145)

## 2022-08-01 LAB — CBC
HCT: 44.3 % (ref 39.0–52.0)
Hemoglobin: 14.6 g/dL (ref 13.0–17.0)
MCH: 30.1 pg (ref 26.0–34.0)
MCHC: 33 g/dL (ref 30.0–36.0)
MCV: 91.3 fL (ref 80.0–100.0)
Platelets: 187 10*3/uL (ref 150–400)
RBC: 4.85 MIL/uL (ref 4.22–5.81)
RDW: 14.6 % (ref 11.5–15.5)
WBC: 8.9 10*3/uL (ref 4.0–10.5)
nRBC: 0 % (ref 0.0–0.2)

## 2022-08-01 LAB — MAGNESIUM: Magnesium: 2.1 mg/dL (ref 1.7–2.4)

## 2022-08-01 LAB — GLUCOSE, CAPILLARY: Glucose-Capillary: 139 mg/dL — ABNORMAL HIGH (ref 70–99)

## 2022-08-01 MED ORDER — FUROSEMIDE 40 MG PO TABS: 40.0000 mg | ORAL_TABLET | Freq: Every day | ORAL | 3 refills | Status: DC

## 2022-08-01 MED ORDER — ASPIRIN 81 MG PO TABS: 81.0000 mg | ORAL_TABLET | Freq: Every day | ORAL | 2 refills | Status: DC

## 2022-08-01 MED ORDER — SACUBITRIL-VALSARTAN 24-26 MG PO TABS
1.0000 | ORAL_TABLET | Freq: Two times a day (BID) | ORAL | Status: DC
  Administered 2022-08-01: 1 via ORAL
  Filled 2022-08-01: qty 1

## 2022-08-01 MED ORDER — METOPROLOL SUCCINATE ER 50 MG PO TB24: 50.0000 mg | ORAL_TABLET | Freq: Every day | ORAL | 0 refills | Status: DC

## 2022-08-01 NOTE — Discharge Instructions (Signed)

## 2022-08-01 NOTE — Progress Notes (Signed)
Nutrition Brief Note  RD pulled to chart secondary to CHF.   Wt Readings from Last 15 Encounters:  08/01/22 (!) 141.5 kg  10/19/21 (!) 158.8 kg  12/27/19 (!) 152 kg  04/12/18 (!) 137.7 kg  06/26/17 (!) 138.8 kg  06/23/17 (!) 140.6 kg   Pt with medical history significant for chronic diastolic dysfunction CHF, GERD, hypertension, obesity, hyperthyroidism who presents to the ER for evaluation of  shortness of breath which initially started with exertion but is now short of breath at rest.  Pt admitted with CHF.   RD provided "Low Sodium Nutrition Therapy" handout and attached to AVS/ discharge summary.   Body mass index is 40.05 kg/m. Patient meets criteria for obesity, class III based on current BMI. Obesity is a complex, chronic medical condition that is optimally managed by a multidisciplinary care team. Weight loss is not an ideal goal for an acute inpatient hospitalization. However, if further work-up for obesity is warranted, consider outpatient referral to outpatient bariatric service and/or [facility].    Current diet order is heart healthy/ carb modified (liberalized to 2 gram sodium), patient is consuming approximately n/a% of meals at this time. Labs and medications reviewed.   No nutrition interventions warranted at this time. If nutrition issues arise, please consult RD.   Loistine Chance, RD, LDN, [place] Registered Dietitian II Certified Diabetes Care and Education Specialist Please refer to [facility] for RD and/or RD on-call/weekend/after hours pager

## 2022-08-01 NOTE — Progress Notes (Signed)
SUBJECTIVE: No chest pain or shortness of breath   Vitals:   08/01/22 0000 08/01/22 0316 08/01/22 0317 08/01/22 0500  BP:  97/73    Pulse:  74    Resp:  14    Temp: 98.3 F (36.8 C)  98.3 F (36.8 C)   TempSrc: Oral  Oral   SpO2:  97%    Weight:    (!) 141.5 kg  Height:        Intake/Output Summary (Last 24 hours) at 08/01/2022 0807 Last data filed at 08/01/2022 0526 Gross per 24 hour  Intake --  Output 2950 ml  Net -2950 ml    LABS: Basic Metabolic Panel: Recent Labs    07/31/22 0444 08/01/22 0522  NA 140 140  K 4.6 3.8  CL 112* 106  CO2 24 28  GLUCOSE 126* 109*  BUN 18 24*  CREATININE 1.04 1.10  CALCIUM 8.3* 8.7*  MG  --  2.1   Liver Function Tests: Recent Labs    07/30/22 0421  AST 108*  ALT 67*  ALKPHOS 79  BILITOT 0.8  PROT 6.6  ALBUMIN 3.4*   No results for input(s): "LIPASE", "AMYLASE" in the last 72 hours. CBC: Recent Labs    07/30/22 0325 07/31/22 0444 08/01/22 0522  WBC 11.3* 13.6* 8.9  NEUTROABS 4.9  --   --   HGB 15.8 13.6 14.6  HCT 51.0 43.2 44.3  MCV 97.9 94.5 91.3  PLT 210 153 187   Cardiac Enzymes: No results for input(s): "CKTOTAL", "CKMB", "CKMBINDEX", "TROPONINI" in the last 72 hours. BNP: Invalid input(s): "POCBNP" D-Dimer: No results for input(s): "DDIMER" in the last 72 hours. Hemoglobin A1C: Recent Labs    07/31/22 0444  HGBA1C 5.8*   Fasting Lipid Panel: No results for input(s): "CHOL", "HDL", "LDLCALC", "TRIG", "CHOLHDL", "LDLDIRECT" in the last 72 hours. Thyroid Function Tests: Recent Labs    07/30/22 1858  TSH 0.376   Anemia Panel: No results for input(s): "VITAMINB12", "FOLATE", "FERRITIN", "TIBC", "IRON", "RETICCTPCT" in the last 72 hours.   PHYSICAL EXAM General: Well developed, well nourished, in no acute distress HEENT:  Normocephalic and atramatic Neck:  No JVD.  Lungs: Clear bilaterally to auscultation and percussion. Heart: HRRR . Normal S1 and S2 without gallops or murmurs.  Abdomen:  Bowel sounds are positive, abdomen soft and non-tender  Msk:  Back normal, normal gait. Normal strength and tone for age. Extremities: No clubbing, cyanosis or edema.   Neuro: Alert and oriented X 3. Psych:  Good affect, responds appropriately  TELEMETRY: Sinus rhythm  ASSESSMENT AND PLAN: HFrEF with ejection fraction in the past 20% recent EF was 45%.  Patient came with decompensated CHF and received IV Lasix twice a day and is feeling much better.  May restart Entresto at 24 mg dose.  Plan on discharging with follow-up in the office this Thursday at 10 AM.  Principal Problem:   Acute on chronic systolic CHF (congestive heart failure) (HCC) Active Problems:   Obesity   Hypertension   GERD (gastroesophageal reflux disease)   Hyperthyroidism   Hypokalemia   Hyperglycemia   Elevated troponin   Acute respiratory failure with hypoxia (HCC)    Ronald Horne A, MD, [facility] 08/01/2022 8:07 AM

## 2022-08-01 NOTE — Discharge Summary (Signed)
Physician Discharge Summary   Patient: Ronald Horne MRN: [id number] DOB: 08/07/1951  Admit date:     07/30/2022  Discharge date: 08/01/22  Discharge Physician: Enedina Finner   PCP: Sherrie Mustache, MD   Recommendations at discharge:    F/u dr Welton Flakes on Thursday 10/5 F/u PCP in 1-2 weeks  Discharge Diagnoses: Principal Problem:   Acute on chronic systolic CHF (congestive heart failure) (HCC) Active Problems:   Obesity   Hypertension   GERD (gastroesophageal reflux disease)   Hyperthyroidism   Hypokalemia   Hyperglycemia   Elevated troponin   Acute respiratory failure with hypoxia [facility])  Hospital Course:  [name] is a 71 y.o. male with medical history significant for systolic CHF, hypertension, obesity, hyperthyroidism who presented to the ER for evaluation of shortness of breath which initially started with exertion but is now short of breath at rest.  Shortness of breath associated with two-pillow orthopnea and PND.    Acute on chronic systolic CHF (congestive heart failure) (HCC) Patient presents for evaluation of worsening shortness of breath associated orthopnea and PND Was noted to be hypoxic upon arrival to the ER with room air pulse oximetry in the 60s and required BiPAP transiently. Chest x-ray shows findings consistent with CHF and BNP was elevated Last 2D echocardiogram from 2019 showed an LVEF of 20% but cardiac catheterization done in 2021 showed normal coronaries with ejection fraction at that time 45% --follows with Dr. Welton Flakes, who was consulted --diuresed about 5.5 Liters.On RA --feels back to baseline. Does not use oxygen at home --po laisx, enteresto, corlanor, BB--per Dr Welton Flakes. Ok to d/c home and f/u thurs   Acute respiratory failure with hypoxia (HCC) 2/2 pulm edema and vascular congestion from CHF exacerbation --Room air pulse oximetry was 62% when he arrived to ER and he was placed on a BiPAP briefly then transitioned to [place] -on 2L--now on RA   Elevated  troponin 2/2 demand ischemia from CHF exacerbation. --trop peaked at 79   Hyperglycemia No known history of diabetes mellitus  hemoglobin A1c 5.8%   Hypokalemia Secondary to diuretic use Supplement potassium   Hyperthyroidism Continue methimazole   GERD (gastroesophageal reflux disease) Continue PPI   Hypertension --BP currently soft --cont aldactone --cont Toprol at reduced 50 mg daily   Obesity BMI 41.8 Complicates overall prognosis and care Lifestyle modification and exercise discussed with patient in detail     DVT prophylaxis: Lovenox SQ Code Status: Full code  Family Communication:  Level of care: Progressive Dispo:   The patient is from: home Anticipated d/c is to: home Anticipated d/c date is: 2-3 days Patient currently is not medically ready to d/c due to: on IV lasix      Consultants: Dr Welton Flakes Disposition: Home Diet recommendation:  Discharge Diet Orders (From admission, onward)     Start     Ordered   08/01/22 0000  Diet - low sodium heart healthy        08/01/22 1009           Cardiac diet DISCHARGE MEDICATION: Allergies as of 08/01/2022   No Known Allergies      Medication List     STOP taking these medications    ARIPiprazole 5 MG tablet Commonly known as: ABILIFY   haloperidol 5 MG tablet Commonly known as: HALDOL   lisinopril 40 MG tablet Commonly known as: ZESTRIL   metoprolol tartrate 25 MG tablet Commonly known as: LOPRESSOR   mirtazapine 30 MG tablet Commonly known as: REMERON  naproxen 500 MG tablet Commonly known as: NAPROSYN   potassium chloride SA 20 MEQ tablet Commonly known as: KLOR-CON M   QUEtiapine 25 MG tablet Commonly known as: SEROQUEL       TAKE these medications    aspirin 81 MG tablet Take 1 tablet (81 mg total) by mouth daily.   atorvastatin 10 MG tablet Commonly known as: LIPITOR Take 10 mg by mouth daily.   Corlanor 7.5 MG Tabs tablet Generic drug: ivabradine Take 7.5 mg by  mouth 2 (two) times daily.   Entresto 97-103 MG Generic drug: sacubitril-valsartan Take 1 tablet by mouth 2 (two) times daily.   ergocalciferol 1.25 MG (50000 UT) capsule Commonly known as: VITAMIN D2 Take 50,000 Units by mouth once a week.   Farxiga 10 MG Tabs tablet Generic drug: dapagliflozin propanediol Take 10 mg by mouth daily.   furosemide 40 MG tablet Commonly known as: LASIX Take 1 tablet (40 mg total) by mouth daily.   ketoconazole 2 % shampoo Commonly known as: NIZORAL Apply topically.   methimazole 5 MG tablet Commonly known as: TAPAZOLE Take 7.5 mg by mouth daily.   metoprolol succinate 50 MG 24 hr tablet Commonly known as: TOPROL-XL Take 1 tablet (50 mg total) by mouth daily. Take with or immediately following a meal. Start taking on: August 02, 2022 What changed:  medication strength how much to take Another medication with the same name was removed. Continue taking this medication, and follow the directions you see here.   spironolactone 25 MG tablet Commonly known as: ALDACTONE Take 12.5 mg by mouth daily.        Follow-up Information     Casilda Carls, MD. Schedule an appointment as soon as possible for a visit in 1 week(s).   Specialty: Internal Medicine Contact information: [place] [address] [phone]         Dionisio David, MD. Go on 08/04/2022.   Specialty: Cardiology Why: at 10 am Contact information: [place] [address] [phone]                Discharge Exam: Filed Weights   07/30/22 0321 07/30/22 0438 08/01/22 0500  Weight: (!) 158.8 kg (!) 148 kg (!) 141.5 kg     Condition at discharge: fair  The results of significant diagnostics from this hospitalization (including imaging, microbiology, ancillary and laboratory) are listed below for reference.   Imaging Studies: ECHOCARDIOGRAM COMPLETE  Result Date: 07/31/2022    ECHOCARDIOGRAM REPORT   Patient Name:    NATHANAL Horne Date of Exam: 07/31/2022 Medical Rec #:  [id number]      Height:       74.0 in Accession #:    [id number]     Weight:       326.3 lb Date of Birth:  06-Sep-1951       BSA:          2.676 m Patient Age:    71 years       BP:           100/50 mmHg Patient Gender: M              HR:           76 bpm. Exam Location:  [facility] Procedure: 2D Echo, Color Doppler, Cardiac Doppler and Intracardiac            Opacification Agent Indications:     I50.21 congestive heart failure-Acute Systolic  History:  Patient has prior history of Echocardiogram examinations, most                  recent 04/16/2018. Cardiomyopathy; Risk Factors:Hypertension.  Sonographer:     Humphrey Rolls Referring Phys:  [id number] PPIRJJOA AGBATA Diagnosing Phys: Adrian Blackwater  Sonographer Comments: Technically difficult study due to poor echo windows. Image acquisition challenging due to patient body habitus. IMPRESSIONS  1. Left ventricular ejection fraction, by estimation, is 30 to 35%. The left ventricle has moderately decreased function. The left ventricle demonstrates global hypokinesis. The left ventricular internal cavity size was moderately dilated. There is mild  left ventricular hypertrophy. Left ventricular diastolic parameters are consistent with Grade II diastolic dysfunction (pseudonormalization).  2. Right ventricular systolic function is mildly reduced. The right ventricular size is moderately enlarged. Mildly increased right ventricular wall thickness.  3. Left atrial size was moderately dilated.  4. Right atrial size was moderately dilated.  5. The mitral valve is myxomatous. Mild mitral valve regurgitation.  6. The aortic valve is calcified. Aortic valve regurgitation is trivial. Aortic valve sclerosis is present, with no evidence of aortic valve stenosis. Conclusion(s)/Recommendation(s): Findings consistent with dilated cardiomyopathy. FINDINGS  Left Ventricle: Left ventricular ejection fraction, by estimation, is 30 to 35%. The  left ventricle has moderately decreased function. The left ventricle demonstrates global hypokinesis. Definity contrast agent was given IV to delineate the left ventricular endocardial borders. The left ventricular internal cavity size was moderately dilated. There is mild left ventricular hypertrophy. Left ventricular diastolic parameters are consistent with Grade II diastolic dysfunction (pseudonormalization). Right Ventricle: The right ventricular size is moderately enlarged. Mildly increased right ventricular wall thickness. Right ventricular systolic function is mildly reduced. Left Atrium: Left atrial size was moderately dilated. Right Atrium: Right atrial size was moderately dilated. Pericardium: There is no evidence of pericardial effusion. Mitral Valve: The mitral valve is myxomatous. Mild mitral annular calcification. Mild mitral valve regurgitation. Tricuspid Valve: The tricuspid valve is grossly normal. Tricuspid valve regurgitation is mild. Aortic Valve: The aortic valve is calcified. Aortic valve regurgitation is trivial. Aortic valve sclerosis is present, with no evidence of aortic valve stenosis. Aortic valve mean gradient measures 3.0 mmHg. Aortic valve peak gradient measures 6.2 mmHg. Aortic valve area, by VTI measures 2.66 cm. Pulmonic Valve: The pulmonic valve was normal in structure. Pulmonic valve regurgitation is trivial. Aorta: The aortic root, ascending aorta and aortic arch are all structurally normal, with no evidence of dilitation or obstruction. IAS/Shunts: No atrial level shunt detected by color flow Doppler.  LEFT VENTRICLE PLAX 2D LVIDd:         5.90 cm      Diastology LVIDs:         4.90 cm      LV e' medial:    5.44 cm/s LV PW:         0.90 cm      LV E/e' medial:  21.0 LV IVS:        0.80 cm      LV e' lateral:   9.46 cm/s LVOT diam:     2.40 cm      LV E/e' lateral: 12.1 LV SV:         62 LV SV Index:   23 LVOT Area:     4.52 cm  LV Volumes (MOD) LV vol d, MOD A2C: 231.0 ml LV  vol d, MOD A4C: 243.0 ml LV vol s, MOD A2C: 192.0 ml LV vol s, MOD A4C: 191.0 ml LV SV  MOD A2C:     39.0 ml LV SV MOD A4C:     243.0 ml LV SV MOD BP:      39.9 ml RIGHT VENTRICLE RV Basal diam:  4.50 cm RV S prime:     12.70 cm/s LEFT ATRIUM             Index        RIGHT ATRIUM           Index LA diam:        5.00 cm 1.87 cm/m   RA Area:     16.80 cm LA Vol (A2C):   68.2 ml 25.49 ml/m  RA Volume:   45.60 ml  17.04 ml/m LA Vol (A4C):   53.7 ml 20.07 ml/m LA Biplane Vol: 62.5 ml 23.36 ml/m  AORTIC VALVE                    PULMONIC VALVE AV Area (Vmax):    2.73 cm     PV Vmax:       0.89 m/s AV Area (Vmean):   2.63 cm     PV Peak grad:  3.2 mmHg AV Area (VTI):     2.66 cm AV Vmax:           124.00 cm/s AV Vmean:          85.800 cm/s AV VTI:            0.233 m AV Peak Grad:      6.2 mmHg AV Mean Grad:      3.0 mmHg LVOT Vmax:         74.90 cm/s LVOT Vmean:        49.800 cm/s LVOT VTI:          0.137 m LVOT/AV VTI ratio: 0.59  AORTA Ao Root diam: 3.70 cm MITRAL VALVE MV Area (PHT): 5.62 cm     SHUNTS MV Decel Time: 135 msec     Systemic VTI:  0.14 m MV E velocity: 114.00 cm/s  Systemic Diam: 2.40 cm MV A velocity: 56.60 cm/s MV E/A ratio:  2.01 Shaukat Khan Electronically signed by Adrian Blackwater Signature Date/Time: 07/31/2022/11:24:19 AM    Final    DG Chest Port 1 View  Result Date: 07/30/2022 CLINICAL DATA:  Shortness of breath EXAM: PORTABLE CHEST 1 VIEW COMPARISON:  10/19/2021 FINDINGS: Cardiomegaly and vascular pedicle widening with diffuse congested appearance of vessels. No visible effusion or pneumothorax. Artifact from EKG leads. IMPRESSION: CHF pattern. Electronically Signed   By: Tiburcio Pea M.D.   On: 07/30/2022 04:13    Microbiology: Results for orders placed or performed during the hospital encounter of 07/30/22  Resp Panel by RT-PCR (Flu A&B, Covid) Anterior Nasal Swab     Status: None   Collection Time: 07/30/22  3:25 AM   Specimen: Anterior Nasal Swab  Result Value Ref Range  Status   SARS Coronavirus 2 by RT PCR NEGATIVE NEGATIVE Final    Comment: (NOTE) SARS-CoV-2 target nucleic acids are NOT DETECTED.  The SARS-CoV-2 RNA is generally detectable in upper respiratory specimens during the acute phase of infection. The lowest concentration of SARS-CoV-2 viral copies this assay can detect is 138 copies/mL. A negative result does not preclude SARS-Cov-2 infection and should not be used as the sole basis for treatment or other patient management decisions. A negative result may occur with  improper specimen collection/handling, submission of specimen other than nasopharyngeal swab, presence of viral mutation(s) within the  areas targeted by this assay, and inadequate number of viral copies(<138 copies/mL). A negative result must be combined with clinical observations, patient history, and epidemiological information. The expected result is Negative.  Fact Sheet for Patients:  BloggerCourse.com  Fact Sheet for Healthcare Providers:  SeriousBroker.it  This test is no t yet approved or cleared by the [place] FDA and  has been authorized for detection and/or diagnosis of SARS-CoV-2 by FDA under an Emergency Use Authorization (EUA). This EUA will remain  in effect (meaning this test can be used) for the duration of the COVID-19 declaration under Section 564(b)(1) of the Act, 21 U.S.C.section 360bbb-3(b)(1), unless the authorization is terminated  or revoked sooner.       Influenza A by PCR NEGATIVE NEGATIVE Final   Influenza B by PCR NEGATIVE NEGATIVE Final    Comment: (NOTE) The Xpert Xpress SARS-CoV-2/FLU/RSV plus assay is intended as an aid in the diagnosis of influenza from Nasopharyngeal swab specimens and should not be used as a sole basis for treatment. Nasal washings and aspirates are unacceptable for Xpert Xpress SARS-CoV-2/FLU/RSV testing.  Fact Sheet for  Patients: BloggerCourse.com  Fact Sheet for Healthcare Providers: SeriousBroker.it  This test is not yet approved or cleared by the [place] FDA and has been authorized for detection and/or diagnosis of SARS-CoV-2 by FDA under an Emergency Use Authorization (EUA). This EUA will remain in effect (meaning this test can be used) for the duration of the COVID-19 declaration under Section 564(b)(1) of the Act, 21 U.S.C. section 360bbb-3(b)(1), unless the authorization is terminated or revoked.  Performed at [facility], [address] [place], [place] [address]     Labs: CBC: Recent Labs  Lab 07/30/22 0325 07/31/22 0444 08/01/22 0522  WBC 11.3* 13.6* 8.9  NEUTROABS 4.9  --   --   HGB 15.8 13.6 14.6  HCT 51.0 43.2 44.3  MCV 97.9 94.5 91.3  PLT 210 153 187   Basic Metabolic Panel: Recent Labs  Lab 07/30/22 0421 07/31/22 0444 08/01/22 0522  NA 137 140 140  K 3.4* 4.6 3.8  CL 108 112* 106  CO2 19* 24 28  GLUCOSE 212* 126* 109*  BUN 12 18 24*  CREATININE 1.10 1.04 1.10  CALCIUM 8.3* 8.3* 8.7*  MG  --   --  2.1   Liver Function Tests: Recent Labs  Lab 07/30/22 0421  AST 108*  ALT 67*  ALKPHOS 79  BILITOT 0.8  PROT 6.6  ALBUMIN 3.4*   CBG: Recent Labs  Lab 07/31/22 1141 07/31/22 1639 07/31/22 2011 08/01/22 0810  GLUCAP 107* 100* 127* 139*    Discharge time spent: greater than 30 minutes.  Signed: Enedina Finner, MD [facility] 08/01/2022

## 2022-08-01 NOTE — Consult Note (Signed)
   Heart Failure Nurse Navigator Note  HFrEF 30 to 35%.  Mild LVH.  Grade 2 diastolic dysfunction.  Right ventricular systolic function is mildly reduced.  He presented to the emergency room with complaints of dyspnea and shortness of breath at rest and two-pillow orthopnea and PND.  BNP 591.  Chest x-ray consistent with CHF.  Comorbidities:  Obesity Hypertension GERD Hypothyroidism Gout  Medications:  Aspirin 81 mg daily Atorvastatin 10 mg daily Farxiga 10 mg daily Furosemide 40 mg IV 2 times a day Corlanor 7.5 mg 2 times a day with meals Metoprolol succinate 50 mg daily Potassium chloride 20 mEq daily Spironolactone 12.5 mg daily Entresto 24/26 mg 2 times a day  Labs:  Sodium 140, potassium 3.8, chloride 106, CO2 28, BUN 24, creatinine 1.1, estimated GFR greater than 60. Weight 311 pounds Blood pressure 105/74 Intake not documented Output 2950 mL   Initial meeting with the patient, he was sitting up in the chair at bedside, states that he is being discharged home.  Discussed what heart failure means.  He described it as his heart not pumping like it should.  Went over how he takes care of himself at home.  At the time of discharge he was not weighing himself daily as his scale was not working.  He was also making himself two large vegetable smoothies.  Which as he looked back on it felt that they measured 2 quarts.  He was also drinking a sixpack of water of the 16.9 ounce bottles waters.  And in the evening he would drink 1-2 ensures.Martin Majestic over daily intake of no more than 64 ounces which includes water or tea coffee juices, his smoothie and the Ensure.  Also made aware anything that melts at room temperature such as ice cream, pudding or Jell-O constitutes a liquid.  He does not add salt at the table and uses Mrs. Dash along with fresh garlic for seasoning.  He does not eat at restaurants and abstains from processed foods  Discussed weight gains and what to report  along with changes in symptoms to report.  He was given a scale.  He was also given the living with heart failure teaching booklet, zone magnet, info on low-sodium and heart failure along with weight chart.  Discussed follow-up in the outpatient heart failure clinic for which she has an appointment on October 9 at 9 AM.  He has 35% no-show ratio which is 11 out of 31 appointments.  He had no further questions.   Pricilla Riffle RN [facility]

## 2022-08-06 NOTE — Progress Notes (Unsigned)
   Patient ID: Dayton Kenley, male    DOB: 1951-06-22, 71 y.o.   MRN: [id number]  HPI  Mr Mynor is a 71 y/o male with a history of  Echo report from 07/31/22 reviewed and showed an EF of 30-35% along with mild LVH, moderate LAE and mild MR.   LHC done 12/27/19  Admitted 07/30/22 due to SOB due to acute on chronic HF. Initially given IV lasix with transition to oral diuretics. Cardiology consult obtained. Elevated troponin thought to be due to demand ischemia. Discharged after 2 days.   He presents today for his initial visit with a chief complaint of   Review of Systems    Physical Exam  Assessment & Plan:  1: Chronic heart failure with reduced ejection fraction- - NYHA class - on GDMT of  - BNP 07/30/22 was 591.4  2: HTN- - BP - BMP 08/01/22 reviewed and showed sodium 140, potassium 3.8, creatinine 1.10 and GFR >60

## 2022-08-08 ENCOUNTER — Ambulatory Visit: Payer: Medicare HMO | Attending: Family | Admitting: Family

## 2022-08-08 ENCOUNTER — Encounter: Payer: Self-pay | Admitting: Family

## 2022-08-08 VITALS — BP 105/67 | HR 94 | Resp 20 | Ht 73.0 in | Wt 311.4 lb

## 2022-08-08 DIAGNOSIS — F419 Anxiety disorder, unspecified: Secondary | ICD-10-CM | POA: Diagnosis not present

## 2022-08-08 DIAGNOSIS — Z87891 Personal history of nicotine dependence: Secondary | ICD-10-CM | POA: Diagnosis present

## 2022-08-08 DIAGNOSIS — I110 Hypertensive heart disease with heart failure: Secondary | ICD-10-CM | POA: Insufficient documentation

## 2022-08-08 DIAGNOSIS — R5383 Other fatigue: Secondary | ICD-10-CM | POA: Insufficient documentation

## 2022-08-08 DIAGNOSIS — Z7984 Long term (current) use of oral hypoglycemic drugs: Secondary | ICD-10-CM | POA: Insufficient documentation

## 2022-08-08 DIAGNOSIS — K219 Gastro-esophageal reflux disease without esophagitis: Secondary | ICD-10-CM | POA: Insufficient documentation

## 2022-08-08 DIAGNOSIS — I10 Essential (primary) hypertension: Secondary | ICD-10-CM

## 2022-08-08 DIAGNOSIS — I5022 Chronic systolic (congestive) heart failure: Secondary | ICD-10-CM

## 2022-08-08 NOTE — Patient Instructions (Signed)
Continue weighing daily and call for an overnight weight gain of 3 pounds or more or a weekly weight gain of more than 5 pounds.   If you have voicemail, please make sure your mailbox is cleaned out so that we may leave a message and please make sure to listen to any voicemails.     

## 2022-08-29 ENCOUNTER — Other Ambulatory Visit (INDEPENDENT_AMBULATORY_CARE_PROVIDER_SITE_OTHER): Payer: Self-pay | Admitting: Nurse Practitioner

## 2022-08-29 DIAGNOSIS — I739 Peripheral vascular disease, unspecified: Secondary | ICD-10-CM

## 2022-09-02 ENCOUNTER — Encounter (INDEPENDENT_AMBULATORY_CARE_PROVIDER_SITE_OTHER): Payer: Self-pay | Admitting: Vascular Surgery

## 2022-09-02 ENCOUNTER — Ambulatory Visit (INDEPENDENT_AMBULATORY_CARE_PROVIDER_SITE_OTHER): Payer: Medicare HMO | Admitting: Vascular Surgery

## 2022-09-02 ENCOUNTER — Ambulatory Visit (INDEPENDENT_AMBULATORY_CARE_PROVIDER_SITE_OTHER): Payer: Medicare HMO

## 2022-09-02 VITALS — BP 101/67 | HR 91 | Resp 16 | Wt 307.6 lb

## 2022-09-02 DIAGNOSIS — I739 Peripheral vascular disease, unspecified: Secondary | ICD-10-CM | POA: Diagnosis not present

## 2022-09-02 DIAGNOSIS — I10 Essential (primary) hypertension: Secondary | ICD-10-CM | POA: Diagnosis not present

## 2022-09-02 NOTE — Assessment & Plan Note (Signed)
The patient had a home health screening exam that suggested significant peripheral arterial disease.  This prompted evaluation here. His ABIs were completely normal at 1.23 on the right and 1.29 on the left with normal triphasic waveforms consistent with no arterial insufficiency.  He does not have significant peripheral arterial disease in terms of lack of perfusion.  There may be some calcification or mild disease, but nothing requiring treatment and no further work-up is recommended.  I will see him back as needed.

## 2022-09-02 NOTE — Assessment & Plan Note (Signed)
blood pressure control important in reducing the progression of atherosclerotic disease. On appropriate oral medications.  

## 2022-09-02 NOTE — Progress Notes (Signed)
Patient ID: Javar Eshbach, male   DOB: 1951/10/28, 71 y.o.   MRN: [id number]  Chief Complaint  Patient presents with   New Patient (Initial Visit)    Ref Dario Guardian consult PVD    HPI Ronald Horne is a 71 y.o. male.  I am asked to see the patient by Dr. Dario Guardian for evaluation of home health screening demonstrating reduced ABIs.  Patient says he really does not have any problem with his legs.  His biggest problems with his heart and with weakness and fatigue.  No ulceration or infection.  No rest pain.  We performed noninvasive studies to evaluate his lower extremity arterial perfusion today.  His ABIs were completely normal at 1.23 on the right and 1.29 on the left with normal triphasic waveforms consistent with no arterial insufficiency.     Past Medical History:  Diagnosis Date   Arthritis    Cardiomyopathy (HCC)    a. 08/2016 Echo: EF 30-35%, diff HK; b. 03/2018 Ex MV (Jadali): Ex time 2:00, large anter defect, EF 24%; c. 03/2018 Echo: EF 20-25%, diff HK. Gr1 DD; d. Refused cath.   Chronic back pain    Chronic combined systolic (congestive) and diastolic (congestive) heart failure (HCC)    a. 08/2016 Echo: EF 30-35%, diff HK, mild to mod MR, mild BAE; b. 03/2018 Echo: EF 20-25%, diff HK. Gr1 DD. MIldly dil Ao root. Mild BAE. Mildly reduced RV fxn.   GERD (gastroesophageal reflux disease)    Gout    Hypertension    Obesity     Past Surgical History:  Procedure Laterality Date   EPIDURAL BLOCK INJECTION     chronic back pain    LEFT HEART CATH AND CORONARY ANGIOGRAPHY Left 12/27/2019   Procedure: LEFT HEART CATH AND CORONARY ANGIOGRAPHY;  Surgeon: Laurier Nancy, MD;  Location: [facility];  Service: Cardiovascular;  Laterality: Left;   TONSILLECTOMY       No family history on file.    Social History   Tobacco Use   Smoking status: Former    Packs/day: 3.00    Years: 1.00    Total pack years: 3.00    Types: Cigarettes    Quit date: 12/12/1976    Years since  quitting: 45.7   Smokeless tobacco: Never  Vaping Use   Vaping Use: Never used  Substance Use Topics   Alcohol use: No   Drug use: No    No Known Allergies  Current Outpatient Medications  Medication Sig Dispense Refill   ascorbic acid (VITAMIN C) 500 MG tablet Take 500 mg by mouth daily.     aspirin 81 MG tablet Take 1 tablet (81 mg total) by mouth daily. 30 tablet 2   atorvastatin (LIPITOR) 10 MG tablet Take 10 mg by mouth daily.     CORLANOR 7.5 MG TABS tablet Take 7.5 mg by mouth 2 (two) times daily.     ENTRESTO 97-103 MG Take 1 tablet by mouth 2 (two) times daily.     ergocalciferol (VITAMIN D2) 1.25 MG (50000 UT) capsule Take 50,000 Units by mouth once a week.     FARXIGA 10 MG TABS tablet Take 10 mg by mouth daily.     furosemide (LASIX) 40 MG tablet Take 1 tablet (40 mg total) by mouth daily. 90 tablet 3   ketoconazole (NIZORAL) 2 % shampoo Apply topically.     methimazole (TAPAZOLE) 5 MG tablet Take 7.5 mg by mouth daily.     metoprolol succinate (  TOPROL-XL) 50 MG 24 hr tablet Take 1 tablet (50 mg total) by mouth daily. Take with or immediately following a meal. 30 tablet 0   spironolactone (ALDACTONE) 25 MG tablet Take 12.5 mg by mouth daily.     No current facility-administered medications for this visit.   Facility-Administered Medications Ordered in Other Visits  Medication Dose Route Frequency Provider Last Rate Last Admin   sodium chloride flush (NS) 0.9 % injection 3 mL  3 mL Intravenous Q12H Dionisio David, MD          REVIEW OF SYSTEMS (Negative unless checked)  Constitutional: [] Weight loss  [] Fever  [] Chills Cardiac: [x] Chest pain   [] Chest pressure   [] Palpitations   [] Shortness of breath when laying flat   [] Shortness of breath at rest   [x] Shortness of breath with exertion. Vascular:  [] Pain in legs with walking   [] Pain in legs at rest   [] Pain in legs when laying flat   [] Claudication   [] Pain in feet when walking  [] Pain in feet at rest  [] Pain in  feet when laying flat   [] History of DVT   [] Phlebitis   [] Swelling in legs   [] Varicose veins   [] Non-healing ulcers Pulmonary:   [] Uses home oxygen   [] Productive cough   [] Hemoptysis   [] Wheeze  [] COPD   [] Asthma Neurologic:  [] Dizziness  [] Blackouts   [] Seizures   [] History of stroke   [] History of TIA  [] Aphasia   [] Temporary blindness   [] Dysphagia   [] Weakness or numbness in arms   [] Weakness or numbness in legs Musculoskeletal:  [x] Arthritis   [] Joint swelling   [x] Joint pain   [] Low back pain Hematologic:  [] Easy bruising  [] Easy bleeding   [] Hypercoagulable state   [] Anemic  [] Hepatitis Gastrointestinal:  [] Blood in stool   [] Vomiting blood  [] Gastroesophageal reflux/heartburn   [] Abdominal pain Genitourinary:  [] Chronic kidney disease   [] Difficult urination  [] Frequent urination  [] Burning with urination   [] Hematuria Skin:  [] Rashes   [] Ulcers   [] Wounds Psychological:  [] History of anxiety   []  History of major depression.    Physical Exam BP 101/67 (BP Location: Left Arm)   Pulse 91   Resp 16   Wt (!) 307 lb 9.6 oz (139.5 kg)   BMI 40.58 kg/m  Gen:  WD/WN, NAD. obese Head: [place]/AT, No temporalis wasting.  Ear/Nose/Throat: Hearing grossly intact, nares w/o erythema or drainage, oropharynx w/o Erythema/Exudate Eyes: Conjunctiva clear, sclera non-icteric  Neck: trachea midline.  No JVD.  Pulmonary:  Good air movement, respirations not labored, no use of accessory muscles  Cardiac: RRR, no JVD Vascular:  Vessel Right Left  Radial Palpable Palpable                          PT Palpable Palpable  DP Palpable Palpable   Gastrointestinal:. No masses, surgical incisions, or scars. Musculoskeletal: M/S 5/5 throughout.  Extremities without ischemic changes.  No deformity or atrophy.  No edema. Neurologic: Sensation grossly intact in extremities.  Symmetrical.  Speech is fluent. Motor exam as listed above. Psychiatric: Judgment intact, Mood & affect appropriate for pt's  clinical situation. Dermatologic: No rashes or ulcers noted.  No cellulitis or open wounds.    Radiology No results found.  Labs Recent Results (from the past 2160 hour(s))  CBC with Differential     Status: Abnormal   Collection Time: 07/30/22  3:25 AM  Result Value Ref Range   WBC 11.3 (H)  4.0 - 10.5 K/uL   RBC 5.21 4.22 - 5.81 MIL/uL   Hemoglobin 15.8 13.0 - 17.0 g/dL   HCT 45.4 09.8 - 11.9 %   MCV 97.9 80.0 - 100.0 fL   MCH 30.3 26.0 - 34.0 pg   MCHC 31.0 30.0 - 36.0 g/dL   RDW 14.7 82.9 - 56.2 %   Platelets 210 150 - 400 K/uL   nRBC 0.0 0.0 - 0.2 %   Neutrophils Relative % 43 %   Neutro Abs 4.9 1.7 - 7.7 K/uL   Lymphocytes Relative 46 %   Lymphs Abs 5.2 (H) 0.7 - 4.0 K/uL   Monocytes Relative 10 %   Monocytes Absolute 1.1 (H) 0.1 - 1.0 K/uL   Eosinophils Relative 1 %   Eosinophils Absolute 0.1 0.0 - 0.5 K/uL   Basophils Relative 0 %   Basophils Absolute 0.1 0.0 - 0.1 K/uL   Immature Granulocytes 0 %   Abs Immature Granulocytes 0.04 0.00 - 0.07 K/uL    Comment: Performed at [facility], [address] [place], [place] [address]  Brain natriuretic peptide     Status: Abnormal   Collection Time: 07/30/22  3:25 AM  Result Value Ref Range   B Natriuretic Peptide 591.4 (H) 0.0 - 100.0 pg/mL    Comment: Performed at [facility], [address] [place], [place] [address]  Troponin I (High Sensitivity)     Status: Abnormal   Collection Time: 07/30/22  3:25 AM  Result Value Ref Range   Troponin I (High Sensitivity) 28 (H) <18 ng/L    Comment: (NOTE) Elevated high sensitivity troponin I (hsTnI) values and significant  changes across serial measurements may suggest ACS but many other  chronic and acute conditions are known to elevate hsTnI results.  Refer to the "Links" section for chest pain algorithms and additional  guidance. Performed at [facility], [address] [place], [place] [address]   Resp Panel by RT-PCR (Flu A&B, Covid)  Anterior Nasal Swab     Status: None   Collection Time: 07/30/22  3:25 AM   Specimen: Anterior Nasal Swab  Result Value Ref Range   SARS Coronavirus 2 by RT PCR NEGATIVE NEGATIVE    Comment: (NOTE) SARS-CoV-2 target nucleic acids are NOT DETECTED.  The SARS-CoV-2 RNA is generally detectable in upper respiratory specimens during the acute phase of infection. The lowest concentration of SARS-CoV-2 viral copies this assay can detect is 138 copies/mL. A negative result does not preclude SARS-Cov-2 infection and should not be used as the sole basis for treatment or other patient management decisions. A negative result may occur with  improper specimen collection/handling, submission of specimen other than nasopharyngeal swab, presence of viral mutation(s) within the areas targeted by this assay, and inadequate number of viral copies(<138 copies/mL). A negative result must be combined with clinical observations, patient history, and epidemiological information. The expected result is Negative.  Fact Sheet for Patients:  BloggerCourse.com  Fact Sheet for Healthcare Providers:  SeriousBroker.it  This test is no t yet approved or cleared by the [place] FDA and  has been authorized for detection and/or diagnosis of SARS-CoV-2 by FDA under an Emergency Use Authorization (EUA). This EUA will remain  in effect (meaning this test can be used) for the duration of the COVID-19 declaration under Section 564(b)(1) of the Act, 21 U.S.C.section 360bbb-3(b)(1), unless the authorization is terminated  or revoked sooner.       Influenza A by PCR NEGATIVE NEGATIVE  Influenza B by PCR NEGATIVE NEGATIVE    Comment: (NOTE) The Xpert Xpress SARS-CoV-2/FLU/RSV plus assay is intended as an aid in the diagnosis of influenza from Nasopharyngeal swab specimens and should not be used as a sole basis for treatment. Nasal washings and aspirates are  unacceptable for Xpert Xpress SARS-CoV-2/FLU/RSV testing.  Fact Sheet for Patients: BloggerCourse.com  Fact Sheet for Healthcare Providers: SeriousBroker.it  This test is not yet approved or cleared by the [place] FDA and has been authorized for detection and/or diagnosis of SARS-CoV-2 by FDA under an Emergency Use Authorization (EUA). This EUA will remain in effect (meaning this test can be used) for the duration of the COVID-19 declaration under Section 564(b)(1) of the Act, 21 U.S.C. section 360bbb-3(b)(1), unless the authorization is terminated or revoked.  Performed at [facility], [address] [place], [place] [address]   Blood gas, arterial     Status: Abnormal   Collection Time: 07/30/22  3:26 AM  Result Value Ref Range   FIO2 100 %   Delivery systems NON-REBREATHER OXYGEN MASK    pH, Arterial 7.27 (L) 7.35 - 7.45   pCO2 arterial 35 32 - 48 mmHg   pO2, Arterial 155 (H) 83 - 108 mmHg   Bicarbonate 16.1 (L) 20.0 - 28.0 mmol/L   Acid-base deficit 9.9 (H) 0.0 - 2.0 mmol/L   O2 Saturation 100 %   Patient temperature 37.0    Collection site LEFT RADIAL    Allens test (pass/fail) PASS PASS    Comment: Performed at [facility], [address] [place], [place] [address]  Comprehensive metabolic panel     Status: Abnormal   Collection Time: 07/30/22  4:21 AM  Result Value Ref Range   Sodium 137 135 - 145 mmol/L   Potassium 3.4 (L) 3.5 - 5.1 mmol/L   Chloride 108 98 - 111 mmol/L   CO2 19 (L) 22 - 32 mmol/L   Glucose, Bld 212 (H) 70 - 99 mg/dL    Comment: Glucose reference range applies only to samples taken after fasting for at least 8 hours.   BUN 12 8 - 23 mg/dL   Creatinine, Ser 6.60 0.61 - 1.24 mg/dL   Calcium 8.3 (L) 8.9 - 10.3 mg/dL   Total Protein 6.6 6.5 - 8.1 g/dL   Albumin 3.4 (L) 3.5 - 5.0 g/dL   AST 630 (H) 15 - 41 U/L   ALT 67 (H) 0 - 44 U/L   Alkaline Phosphatase 79 38 - 126  U/L   Total Bilirubin 0.8 0.3 - 1.2 mg/dL   GFR, Estimated >16 >01 mL/min    Comment: (NOTE) Calculated using the CKD-EPI Creatinine Equation (2021)    Anion gap 10 5 - 15    Comment: Performed at [facility], [address] [place], [place] [address]  Troponin I (High Sensitivity)     Status: Abnormal   Collection Time: 07/30/22  5:40 AM  Result Value Ref Range   Troponin I (High Sensitivity) 49 (H) <18 ng/L    Comment: READ BACK AND VERIFIED WITH ALYSSA BRINKER AT 0630 07/30/22.PMF (NOTE) Elevated high sensitivity troponin I (hsTnI) values and significant  changes across serial measurements may suggest ACS but many other  chronic and acute conditions are known to elevate hsTnI results.  Refer to the "Links" section for chest pain algorithms and additional  guidance. Performed at [facility], [address], [place], [place] [address]   HIV Antibody (routine testing w rflx)  Status: None   Collection Time: 07/30/22  6:58 PM  Result Value Ref Range   HIV Screen 4th Generation wRfx Non Reactive Non Reactive    Comment: Performed at [facility], [address] [place], [place] [address]  Troponin I (High Sensitivity)     Status: Abnormal   Collection Time: 07/30/22  6:58 PM  Result Value Ref Range   Troponin I (High Sensitivity) 71 (H) <18 ng/L    Comment: RESULT CALLED TO, READ BACK BY AND VERIFIED WITH: ALEX HORTON AT 2042 07/30/2022 DLB (NOTE) Elevated high sensitivity troponin I (hsTnI) values and significant  changes across serial measurements may suggest ACS but many other  chronic and acute conditions are known to elevate hsTnI results.  Refer to the Links section for chest pain algorithms and additional  guidance. Performed at [facility], [address] [place], [place] [address]   TSH     Status: None   Collection Time: 07/30/22  6:58 PM  Result Value Ref Range   TSH 0.376 0.350 - 4.500 uIU/mL    Comment: Performed by  a 3rd Generation assay with a functional sensitivity of <=0.01 uIU/mL. Performed at [facility], [address] [place], [place] [address]   Troponin I (High Sensitivity)     Status: Abnormal   Collection Time: 07/31/22  4:44 AM  Result Value Ref Range   Troponin I (High Sensitivity) 55 (H) <18 ng/L    Comment: (NOTE) Elevated high sensitivity troponin I (hsTnI) values and significant  changes across serial measurements may suggest ACS but many other  chronic and acute conditions are known to elevate hsTnI results.  Refer to the "Links" section for chest pain algorithms and additional  guidance. Performed at [facility], [address] [place], [place] [address]   Basic metabolic panel     Status: Abnormal   Collection Time: 07/31/22  4:44 AM  Result Value Ref Range   Sodium 140 135 - 145 mmol/L   Potassium 4.6 3.5 - 5.1 mmol/L   Chloride 112 (H) 98 - 111 mmol/L   CO2 24 22 - 32 mmol/L   Glucose, Bld 126 (H) 70 - 99 mg/dL    Comment: Glucose reference range applies only to samples taken after fasting for at least 8 hours.   BUN 18 8 - 23 mg/dL   Creatinine, Ser 6.57 0.61 - 1.24 mg/dL   Calcium 8.3 (L) 8.9 - 10.3 mg/dL   GFR, Estimated >84 >69 mL/min    Comment: (NOTE) Calculated using the CKD-EPI Creatinine Equation (2021)    Anion gap 4 (L) 5 - 15    Comment: Performed at [facility], [address] [place], [place] [address]  CBC     Status: Abnormal   Collection Time: 07/31/22  4:44 AM  Result Value Ref Range   WBC 13.6 (H) 4.0 - 10.5 K/uL   RBC 4.57 4.22 - 5.81 MIL/uL   Hemoglobin 13.6 13.0 - 17.0 g/dL   HCT 84.1 32.4 - 40.1 %   MCV 94.5 80.0 - 100.0 fL   MCH 29.8 26.0 - 34.0 pg   MCHC 31.5 30.0 - 36.0 g/dL   RDW 02.7 25.3 - 66.4 %   Platelets 153 150 - 400 K/uL   nRBC 0.0 0.0 - 0.2 %    Comment: Performed at [facility], [address] [place], [place] [address]  Hemoglobin A1c     Status: Abnormal   Collection  Time:  07/31/22  4:44 AM  Result Value Ref Range   Hgb A1c MFr Bld 5.8 (H) 4.8 - 5.6 %    Comment: (NOTE) Pre diabetes:          5.7%-6.4%  Diabetes:              >6.4%  Glycemic control for   <7.0% adults with diabetes    Mean Plasma Glucose 119.76 mg/dL    Comment: Performed at [facility], [address] [place], [place] [address]  ECHOCARDIOGRAM COMPLETE     Status: None   Collection Time: 07/31/22 10:29 AM  Result Value Ref Range   Weight 5,220.49 oz   Height 74 in   BP 92/55 mmHg   Ao pk vel 1.24 m/s   AV Area VTI 2.66 cm2   AR max vel 2.73 cm2   AV Mean grad 3.0 mmHg   AV Peak grad 6.2 mmHg   Single Plane A2C EF 16.9 %   Single Plane A4C EF 21.4 %   Calc EF 16.9 %   S' Lateral 4.90 cm   AV Area mean vel 2.63 cm2   Area-P 1/2 5.62 cm2  Glucose, capillary     Status: Abnormal   Collection Time: 07/31/22 11:41 AM  Result Value Ref Range   Glucose-Capillary 107 (H) 70 - 99 mg/dL    Comment: Glucose reference range applies only to samples taken after fasting for at least 8 hours.  Glucose, capillary     Status: Abnormal   Collection Time: 07/31/22  4:39 PM  Result Value Ref Range   Glucose-Capillary 100 (H) 70 - 99 mg/dL    Comment: Glucose reference range applies only to samples taken after fasting for at least 8 hours.  Glucose, capillary     Status: Abnormal   Collection Time: 07/31/22  8:11 PM  Result Value Ref Range   Glucose-Capillary 127 (H) 70 - 99 mg/dL    Comment: Glucose reference range applies only to samples taken after fasting for at least 8 hours.  Basic metabolic panel     Status: Abnormal   Collection Time: 08/01/22  5:22 AM  Result Value Ref Range   Sodium 140 135 - 145 mmol/L   Potassium 3.8 3.5 - 5.1 mmol/L   Chloride 106 98 - 111 mmol/L   CO2 28 22 - 32 mmol/L   Glucose, Bld 109 (H) 70 - 99 mg/dL    Comment: Glucose reference range applies only to samples taken after fasting for at least 8 hours.   BUN 24 (H) 8 - 23 mg/dL    Creatinine, Ser 4.31 0.61 - 1.24 mg/dL   Calcium 8.7 (L) 8.9 - 10.3 mg/dL   GFR, Estimated >54 >00 mL/min    Comment: (NOTE) Calculated using the CKD-EPI Creatinine Equation (2021)    Anion gap 6 5 - 15    Comment: Performed at [facility] [address] [place], [place] [address]  CBC     Status: None   Collection Time: 08/01/22  5:22 AM  Result Value Ref Range   WBC 8.9 4.0 - 10.5 K/uL   RBC 4.85 4.22 - 5.81 MIL/uL   Hemoglobin 14.6 13.0 - 17.0 g/dL   HCT 95.0 93.2 - 67.1 %   MCV 91.3 80.0 - 100.0 fL   MCH 30.1 26.0 - 34.0 pg   MCHC 33.0 30.0 - 36.0 g/dL   RDW 24.5 80.9 - 98.3 %   Platelets 187 150 - 400 K/uL  nRBC 0.0 0.0 - 0.2 %    Comment: Performed at [facility], [address] [place], [place] [address]  Magnesium     Status: None   Collection Time: 08/01/22  5:22 AM  Result Value Ref Range   Magnesium 2.1 1.7 - 2.4 mg/dL    Comment: Performed at [facility], [address] [place], [place] [address]  Glucose, capillary     Status: Abnormal   Collection Time: 08/01/22  8:10 AM  Result Value Ref Range   Glucose-Capillary 139 (H) 70 - 99 mg/dL    Comment: Glucose reference range applies only to samples taken after fasting for at least 8 hours.    Assessment/Plan:  PAD (peripheral artery disease) (HCC) The patient had a home health screening exam that suggested significant peripheral arterial disease.  This prompted evaluation here. His ABIs were completely normal at 1.23 on the right and 1.29 on the left with normal triphasic waveforms consistent with no arterial insufficiency.  He does not have significant peripheral arterial disease in terms of lack of perfusion.  There may be some calcification or mild disease, but nothing requiring treatment and no further work-up is recommended.  I will see him back as needed.  Hypertension blood pressure control important in reducing the progression of atherosclerotic disease. On appropriate  oral medications.      Festus Barren 09/02/2022, 11:44 AM   This note was created with Dragon medical transcription system.  Any errors from dictation are unintentional.

## 2022-10-04 ENCOUNTER — Ambulatory Visit: Payer: Medicare HMO | Admitting: Family

## 2022-10-06 ENCOUNTER — Encounter: Payer: Self-pay | Admitting: Family

## 2022-10-06 ENCOUNTER — Ambulatory Visit: Payer: Medicare HMO | Attending: Family | Admitting: Family

## 2022-10-06 VITALS — BP 95/72 | HR 83 | Resp 18 | Wt 307.0 lb

## 2022-10-06 DIAGNOSIS — I739 Peripheral vascular disease, unspecified: Secondary | ICD-10-CM

## 2022-10-06 DIAGNOSIS — Z7984 Long term (current) use of oral hypoglycemic drugs: Secondary | ICD-10-CM | POA: Diagnosis not present

## 2022-10-06 DIAGNOSIS — I5042 Chronic combined systolic (congestive) and diastolic (congestive) heart failure: Secondary | ICD-10-CM | POA: Insufficient documentation

## 2022-10-06 DIAGNOSIS — F419 Anxiety disorder, unspecified: Secondary | ICD-10-CM | POA: Diagnosis not present

## 2022-10-06 DIAGNOSIS — I10 Essential (primary) hypertension: Secondary | ICD-10-CM | POA: Diagnosis not present

## 2022-10-06 DIAGNOSIS — I5022 Chronic systolic (congestive) heart failure: Secondary | ICD-10-CM | POA: Diagnosis not present

## 2022-10-06 DIAGNOSIS — G4730 Sleep apnea, unspecified: Secondary | ICD-10-CM | POA: Diagnosis not present

## 2022-10-06 DIAGNOSIS — Z79899 Other long term (current) drug therapy: Secondary | ICD-10-CM | POA: Insufficient documentation

## 2022-10-06 DIAGNOSIS — G4733 Obstructive sleep apnea (adult) (pediatric): Secondary | ICD-10-CM

## 2022-10-06 DIAGNOSIS — I110 Hypertensive heart disease with heart failure: Secondary | ICD-10-CM | POA: Insufficient documentation

## 2022-10-06 DIAGNOSIS — R5383 Other fatigue: Secondary | ICD-10-CM | POA: Insufficient documentation

## 2022-10-06 DIAGNOSIS — K219 Gastro-esophageal reflux disease without esophagitis: Secondary | ICD-10-CM | POA: Diagnosis not present

## 2022-10-06 DIAGNOSIS — R0602 Shortness of breath: Secondary | ICD-10-CM | POA: Insufficient documentation

## 2022-10-06 DIAGNOSIS — Z87891 Personal history of nicotine dependence: Secondary | ICD-10-CM | POA: Insufficient documentation

## 2022-10-06 NOTE — Progress Notes (Signed)
Patient ID: Ronald Horne, male    DOB: 01/14/1951, 71 y.o.   MRN: [id number]  HPI  Ronald Horne is a 71 y/o male with a history of HTN, GERD, gout, previous tobacco use and chronic heart failure.   Echo report from 07/31/22 reviewed and showed an EF of 30-35% along with mild LVH, moderate LAE and mild Ronald.   LHC done 12/27/19: Normal coronaries with mild to moderate LV dysfunction   Admitted 07/30/22 due to SOB due to acute on chronic HF. Initially given IV lasix with transition to oral diuretics. Cardiology consult obtained. Elevated troponin thought to be due to demand ischemia. Discharged after 2 days.   He presents today for a follow-up visit with a chief complaint of minimal shortness of breath with moderate exertion. Describes this as chronic in nature. He has associated fatigue, depression/ anxiety (due to family dynamics) and dental issues along with this. He denies any dizziness, abdominal distention, palpitations, pedal edema, chest pain, cough or weight gain.   Past Medical History:  Diagnosis Date   Arthritis    Cardiomyopathy (HCC)    a. 08/2016 Echo: EF 30-35%, diff HK; b. 03/2018 Ex MV ([facility]): Ex time 2:00, large anter defect, EF 24%; c. 03/2018 Echo: EF 20-25%, diff HK. Gr1 DD; d. Refused cath.   Chronic back pain    Chronic combined systolic (congestive) and diastolic (congestive) heart failure (HCC)    a. 08/2016 Echo: EF 30-35%, diff HK, mild to mod Ronald, mild BAE; b. 03/2018 Echo: EF 20-25%, diff HK. Gr1 DD. MIldly dil Ao root. Mild BAE. Mildly reduced RV fxn.   GERD (gastroesophageal reflux disease)    Gout    Hypertension    Obesity    Past Surgical History:  Procedure Laterality Date   EPIDURAL BLOCK INJECTION     chronic back pain    LEFT HEART CATH AND CORONARY ANGIOGRAPHY Left 12/27/2019   Procedure: LEFT HEART CATH AND CORONARY ANGIOGRAPHY;  Surgeon: Laurier Nancy, MD;  Location: [facility];  Service: Cardiovascular;  Laterality: Left;   TONSILLECTOMY      No family history on file. Social History   Tobacco Use   Smoking status: Former    Packs/day: 3.00    Years: 1.00    Total pack years: 3.00    Types: Cigarettes    Quit date: 12/12/1976    Years since quitting: 45.8   Smokeless tobacco: Never  Substance Use Topics   Alcohol use: No   No Known Allergies  Prior to Admission medications   Medication Sig Start Date End Date Taking? Authorizing Provider  ascorbic acid (VITAMIN C) 500 MG tablet Take 500 mg by mouth daily.   Yes [provider]  atorvastatin (LIPITOR) 10 MG tablet Take 10 mg by mouth daily.   Yes [provider]  CORLANOR 7.5 MG TABS tablet Take 7.5 mg by mouth 2 (two) times daily. 06/30/22  Yes [provider]  ENTRESTO 97-103 MG Take 1 tablet by mouth 2 (two) times daily. 07/18/22  Yes [provider]  ergocalciferol (VITAMIN D2) 1.25 MG (50000 UT) capsule Take 50,000 Units by mouth once a week.   Yes [provider]  FARXIGA 10 MG TABS tablet Take 10 mg by mouth daily. 07/18/22  Yes [provider]  furosemide (LASIX) 40 MG tablet Take 1 tablet (40 mg total) by mouth daily. 08/01/22  Yes Enedina Finner, MD  methimazole (TAPAZOLE) 5 MG tablet Take 7.5 mg by mouth daily.  Yes [provider]  metoprolol succinate (TOPROL-XL) 50 MG 24 hr tablet Take 1 tablet (50 mg total) by mouth daily. Take with or immediately following a meal. 08/02/22  Yes Enedina Finner, MD  spironolactone (ALDACTONE) 25 MG tablet Take 12.5 mg by mouth daily. 07/18/22  Yes [provider]  aspirin 81 MG tablet Take 1 tablet (81 mg total) by mouth daily. Patient not taking: Reported on 10/06/2022 08/01/22   Enedina Finner, MD    Review of Systems  Constitutional:  Positive for fatigue. Negative for appetite change.  HENT:  Positive for dental problem (gum procedure). Negative for congestion, postnasal drip and sore throat.   Eyes: Negative.   Respiratory:  Positive for shortness of breath  (minimal). Negative for cough and chest tightness.   Cardiovascular:  Negative for chest pain, palpitations and leg swelling.  Gastrointestinal:  Negative for abdominal distention and abdominal pain.  Endocrine: Negative.   Genitourinary: Negative.   Musculoskeletal:  Negative for back pain and neck pain.  Skin: Negative.   Allergic/Immunologic: Negative.   Neurological:  Negative for dizziness and light-headedness.  Hematological:  Negative for adenopathy. Does not bruise/bleed easily.  Psychiatric/Behavioral:  Positive for dysphoric mood (at times). Negative for sleep disturbance (sleeping on 1-2 pillows). The patient is nervous/anxious (sometimes due to family dynamics).    Vitals:   10/06/22 1242  BP: 95/72  Pulse: 83  Resp: 18  SpO2: 100%  Weight: (!) 307 lb (139.3 kg)   Wt Readings from Last 3 Encounters:  10/06/22 (!) 307 lb (139.3 kg)  09/02/22 (!) 307 lb 9.6 oz (139.5 kg)  08/08/22 (!) 311 lb 6 oz (141.2 kg)   Lab Results  Component Value Date   CREATININE 1.10 08/01/2022   CREATININE 1.04 07/31/2022   CREATININE 1.10 07/30/2022   Physical Exam Vitals and nursing note reviewed.  Constitutional:      Appearance: Normal appearance.  HENT:     Head: Normocephalic and atraumatic.  Cardiovascular:     Rate and Rhythm: Normal rate and regular rhythm.  Pulmonary:     Effort: Pulmonary effort is normal. No respiratory distress.     Breath sounds: No wheezing or rales.  Abdominal:     General: There is no distension.     Palpations: Abdomen is soft.     Tenderness: There is no abdominal tenderness.  Musculoskeletal:        General: No tenderness.     Cervical back: Normal range of motion and neck supple.     Right lower leg: No edema.     Left lower leg: No edema.  Skin:    General: Skin is warm and dry.  Neurological:     General: No focal deficit present.     Mental Status: He is alert and oriented to person, place, and time.  Psychiatric:        Mood and  Affect: Mood normal.        Behavior: Behavior normal.        Thought Content: Thought content normal.   Assessment & Plan:  1: Chronic heart failure with reduced ejection fraction- - NYHA class II - euvolemic today - weighing daily; reminded to call for an overnight weight gain of > 2 pounds or a weekly weight gain of > 5 pounds - weight down 4 pounds from last visit here 2 months ago - not adding salt and trying to eat low sodium foods - keeping daily fluid intake to 60-64 ouncess - on  GDMT of entresto, farxiga, metoprolol and spironolactone - saw cardiology Ronald Horne) last week  - BNP 07/30/22 was 591.4  2: HTN- - BP on the low side (95/72) but without dizziness - sees PCP Ronald Horne) next week - BMP 08/01/22 reviewed and showed sodium 140, potassium 3.8, creatinine 1.10 and GFR >60  3: Anxiety- - reports trying to limit his stress but says it's difficult because of the family that he's sharing a house with - voices understanding of how his mental health can certainly impact his physical heath  4: PVD/ PAD- - saw vascular (Ronald Horne) 09/02/22  5: Sleep apnea- - saw pulmonology Ronald Horne) 08/29/22   Patient did not bring his medications nor a list. Each medication was verbally reviewed with the patient and he was encouraged to bring the bottles to every visit to confirm accuracy of list.  Return in 6 months, sooner if needed.

## 2022-10-06 NOTE — Patient Instructions (Signed)
Continue weighing daily and call for an overnight weight gain of 3 pounds or more or a weekly weight gain of more than 5 pounds.   If you have voicemail, please make sure your mailbox is cleaned out so that we may leave a message and please make sure to listen to any voicemails.     

## 2022-11-22 ENCOUNTER — Other Ambulatory Visit (HOSPITAL_BASED_OUTPATIENT_CLINIC_OR_DEPARTMENT_OTHER): Payer: Self-pay

## 2022-11-22 DIAGNOSIS — R0683 Snoring: Secondary | ICD-10-CM

## 2022-11-22 DIAGNOSIS — G4710 Hypersomnia, unspecified: Secondary | ICD-10-CM

## 2023-01-20 ENCOUNTER — Ambulatory Visit (INDEPENDENT_AMBULATORY_CARE_PROVIDER_SITE_OTHER): Payer: Medicare HMO | Admitting: Cardiovascular Disease

## 2023-01-20 ENCOUNTER — Encounter: Payer: Self-pay | Admitting: Cardiovascular Disease

## 2023-01-20 VITALS — BP 115/80 | HR 95 | Ht 74.0 in | Wt 316.4 lb

## 2023-01-20 DIAGNOSIS — I739 Peripheral vascular disease, unspecified: Secondary | ICD-10-CM

## 2023-01-20 DIAGNOSIS — I519 Heart disease, unspecified: Secondary | ICD-10-CM | POA: Diagnosis not present

## 2023-01-20 DIAGNOSIS — I10 Essential (primary) hypertension: Secondary | ICD-10-CM

## 2023-01-20 DIAGNOSIS — I5023 Acute on chronic systolic (congestive) heart failure: Secondary | ICD-10-CM

## 2023-01-20 MED ORDER — ATORVASTATIN CALCIUM 10 MG PO TABS: 10.0000 mg | ORAL_TABLET | Freq: Every day | ORAL | 2 refills | Status: DC

## 2023-01-20 NOTE — Progress Notes (Signed)
Cardiology Office Note   Date:  01/20/2023   ID:  Ronald Horne, DOB 05-10-1951, MRN [id number]  PCP:  Casilda Carls, MD  Cardiologist:  Neoma Laming, MD      History of Present Illness: Ronald Horne is a 72 y.o. male who presents for  Chief Complaint  Patient presents with   Follow-up    4 month follow up    No chest pain or SOB      Past Medical History:  Diagnosis Date   Arthritis    Cardiomyopathy ([place])    a. 08/2016 Echo: EF 30-35%, diff HK; b. 03/2018 Ex MV ([facility]): Ex time 2:00, large anter defect, EF 24%; c. 03/2018 Echo: EF 20-25%, diff HK. Gr1 DD; d. Refused cath.   Chronic back pain    Chronic combined systolic (congestive) and diastolic (congestive) heart failure ([place])    a. 08/2016 Echo: EF 30-35%, diff HK, mild to mod MR, mild BAE; b. 03/2018 Echo: EF 20-25%, diff HK. Gr1 DD. MIldly dil Ao root. Mild BAE. Mildly reduced RV fxn.   GERD (gastroesophageal reflux disease)    Gout    Hypertension    Obesity      Past Surgical History:  Procedure Laterality Date   EPIDURAL BLOCK INJECTION     chronic back pain    LEFT HEART CATH AND CORONARY ANGIOGRAPHY Left 12/27/2019   Procedure: LEFT HEART CATH AND CORONARY ANGIOGRAPHY;  Surgeon: Dionisio David, MD;  Location: [facility];  Service: Cardiovascular;  Laterality: Left;   TONSILLECTOMY       Current Outpatient Medications  Medication Sig Dispense Refill   ascorbic acid (VITAMIN C) 500 MG tablet Take 500 mg by mouth daily.     CORLANOR 7.5 MG TABS tablet Take 7.5 mg by mouth 2 (two) times daily.     ENTRESTO 97-103 MG Take 1 tablet by mouth 2 (two) times daily.     ergocalciferol (VITAMIN D2) 1.25 MG (50000 UT) capsule Take 50,000 Units by mouth once a week.     FARXIGA 10 MG TABS tablet Take 10 mg by mouth daily.     furosemide (LASIX) 40 MG tablet Take 1 tablet (40 mg total) by mouth daily. 90 tablet 3   methimazole (TAPAZOLE) 5 MG tablet Take 7.5 mg by mouth daily.     metoprolol  succinate (TOPROL-XL) 50 MG 24 hr tablet Take 1 tablet (50 mg total) by mouth daily. Take with or immediately following a meal. 30 tablet 0   spironolactone (ALDACTONE) 25 MG tablet Take 12.5 mg by mouth daily.     aspirin 81 MG tablet Take 1 tablet (81 mg total) by mouth daily. (Patient not taking: Reported on 10/06/2022) 30 tablet 2   atorvastatin (LIPITOR) 10 MG tablet Take 1 tablet (10 mg total) by mouth daily. 30 tablet 2   No current facility-administered medications for this visit.   Facility-Administered Medications Ordered in Other Visits  Medication Dose Route Frequency Provider Last Rate Last Admin   sodium chloride flush (NS) 0.9 % injection 3 mL  3 mL Intravenous Q12H Dionisio David, MD        Allergies:   Patient has no known allergies.    Social History:   reports that he quit smoking about 46 years ago. His smoking use included cigarettes. He has a 3.00 pack-year smoking history. He has never used smokeless tobacco. He reports that he does not drink alcohol and does not use drugs.   Family  History:  family history is not on file.    ROS:     Review of Systems  Constitutional: Negative.   HENT: Negative.    Eyes: Negative.   Respiratory: Negative.    Gastrointestinal: Negative.   Genitourinary: Negative.   Musculoskeletal: Negative.   Skin: Negative.   Neurological: Negative.   Endo/Heme/Allergies: Negative.   Psychiatric/Behavioral: Negative.    All other systems reviewed and are negative.     All other systems are reviewed and negative.    PHYSICAL EXAM: VS:  BP 115/80   Pulse 95   Ht 6\' 2"  (1.88 m)   Wt (!) 316 lb 6.4 oz (143.5 kg)   SpO2 99%   BMI 40.62 kg/m  , BMI Body mass index is 40.62 kg/m. Last weight:  Wt Readings from Last 3 Encounters:  01/20/23 (!) 316 lb 6.4 oz (143.5 kg)  10/06/22 (!) 307 lb (139.3 kg)  09/02/22 (!) 307 lb 9.6 oz (139.5 kg)     Physical Exam Vitals reviewed.  Constitutional:      Appearance: Normal appearance.  He is normal weight.  HENT:     Head: Normocephalic.     Nose: Nose normal.     Mouth/Throat:     Mouth: Mucous membranes are moist.  Eyes:     Pupils: Pupils are equal, round, and reactive to light.  Cardiovascular:     Rate and Rhythm: Normal rate and regular rhythm.     Pulses: Normal pulses.     Heart sounds: Normal heart sounds.  Pulmonary:     Effort: Pulmonary effort is normal.  Abdominal:     General: Abdomen is flat. Bowel sounds are normal.  Musculoskeletal:        General: Normal range of motion.     Cervical back: Normal range of motion.  Skin:    General: Skin is warm.  Neurological:     General: No focal deficit present.     Mental Status: He is alert.  Psychiatric:        Mood and Affect: Mood normal.       EKG:   Recent Labs: 07/30/2022: ALT 67; B Natriuretic Peptide 591.4; TSH 0.376 08/01/2022: BUN 24; Creatinine, Ser 1.10; Hemoglobin 14.6; Magnesium 2.1; Platelets 187; Potassium 3.8; Sodium 140    Lipid Panel No results found for: "CHOL", "TRIG", "HDL", "CHOLHDL", "VLDL", "[facility]", "LDLDIRECT"    TESTS                                                                                          [facility] [address] [place], [place] [address] [phone] STUDY:  Gated Stress / Rest Myocardial Perfusion Imaging Tomographic (SPECT) Including attenuation correction Wall Motion, Left Ventricular Ejection Fraction By Gated Technique.Treadmill Stress Test. SEX:  Male   WEIGHT:   303 lbs    HEIGHT: 73 in            ARMS UP: YES/NO  REFERRING PHYSICIAN:  Dr.Florina Glas Humphrey Rolls                                                                                                                                                                                                                      INDICATION FOR STUDY:  SOB                                                                                                                                                                                                                     TECHNIQUE:  Approximately 20 minutes following the intravenous administration of 10.4 mCi of Tc-66m Sestamibi after stress testing in a reclined supine position with arms above their head if able to do so, gated SPECT imaging of the heart was performed. After about a 2hr break, the patient was injected intravenously with 33.0 mCi of Tc-43m Sestamibi.  Approximately 45 minutes later in the same position as stress imaging SPECT rest imaging of the heart was performed.  STRESS BY:  Neoma Laming, MD PROTOCOL:  Darnell Level  MAX PRED HR: 151                     85%: 128               75%: 113                                                                                                                   RESTING BP: 108/68  RESTING HR: 104  PEAK BP: 148/80   PEAK HR:  127 (84%)                                                                   EXERCISE DURATION: 3:00                                           METS: 4.6     REASON FOR TEST TERMINATION: Fatigue                                                                                                                                  SYMPTOMS: Fatigue  [facility] TREADMILL SCORE:  3                                      RISK: Moderate  EKG RESULTS: Sinus tachycardia. 104/min. 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                                                              IMAGE QUALITY: Good                                                                                                                                                                                                                                                                                                                                   PERFUSION/WALL MOTION FINDINGS: EF = 25%. Large severe fixed basal inferoseptal, inferior and inferolateral, mid inferior and inferolateral, apical inferior and apex (17) wall defects, diffuse hypokinesis.                                                                          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 Consider further workup.  Neoma Laming, MD Stress Interpreting Physician / Nuclear Interpreting Physician                         Neoma Laming MD  Electronically signed by: Neoma Laming     Date: 09/15/2022 11:40 REASON FOR VISIT  Visit for: Echocardiogram/Heart Failure   Sex:   Male   wt= 347   lbs.  BP=142/80  Height=74    inches.        TESTS  Imaging: Echocardiogram:  An echocardiogram in (2-d) mode was performed and in Doppler mode with color flow velocity mapping was performed. The aortic valve cusps are abnormal 1.1  cm, flow velocity 1.13   m/s, and systolic calculated mean flow gradient 2   mmHg. Mitral valve diastolic peak flow velocity E .469     m/s and E/A ratio 0.6. Aortic root diameter 4.5   cm. The LVOT internal diameter 3.4  cm and flow velocity was abnormal .[id number]   m/s. LV systolic dimension [id number]    cm, diastolic [id number]  cm, posterior wall thickness 1.74   cm, fractional shortening 50.7  %, and EF 82 %. IVS thickness 1.16   cm. LA dimension 4.3 cm. Mitral Valve has Trace Regurgitation.     ASSESSMENT  Suboptimal study due to poor windows.   Normal chamber sizes.  Normal left ventricular systolic function.  Mild left ventricular hypertrophy with GRADE 1 (relaxation abnormality) diastolic dysfunction.  Normal right ventricular systolic function.  Normal right ventricular diastolic function.  Normal left ventricular wall motion.  Normal right ventricular wall motion  Normal pulmonary artery pressure.  Trace mitral regurgitation..  No pericardial effusion.  Mildly dilated Left atrium  Mild LVH.     THERAPY   Referring physician: Dionisio David  Sonographer: Neoma Laming.      Neoma Laming MD  Electronically signed by: Neoma Laming     Date: 09/13/2021 10:37 Other studies Reviewed: Additional studies/ records that were reviewed today include:  Review of the above records demonstrates:      04/12/2018   11:13 AM  PAD Screen  Previous PAD dx? No  Previous surgical procedure? No  Pain with walking? No  Feet/toe relief with dangling? No  Painful, non-healing ulcers? No  Extremities discolored? No      ASSESSMENT AND PLAN:    ICD-10-CM   1. Cardiopathy  I51.9 PCV ECHOCARDIOGRAM COMPLETE   will repeat echo    2. Acute on chronic systolic CHF (congestive heart failure) (HCC)  I50.23 PCV ECHOCARDIOGRAM COMPLETE    3. Primary hypertension  I10 PCV ECHOCARDIOGRAM COMPLETE    4. PAD (peripheral artery disease) (HCC)  I73.9 PCV ECHOCARDIOGRAM COMPLETE       Problem List Items Addressed This Visit       Cardiovascular and Mediastinum   Acute on chronic systolic CHF (congestive heart failure) (HCC)   Relevant Medications   atorvastatin (LIPITOR) 10 MG tablet   Other Relevant Orders   PCV ECHOCARDIOGRAM COMPLETE   Hypertension   Relevant Medications   atorvastatin (LIPITOR) 10 MG tablet   Other Relevant Orders   PCV ECHOCARDIOGRAM COMPLETE   PAD (peripheral artery disease) (HCC)   Relevant Medications   atorvastatin (LIPITOR) 10 MG tablet   Other Relevant Orders   PCV ECHOCARDIOGRAM COMPLETE    Other Visit Diagnoses     Cardiopathy    -  Primary   will repeat echo   Relevant Medications   atorvastatin (LIPITOR) 10  MG tablet   Other Relevant Orders   PCV ECHOCARDIOGRAM COMPLETE          Disposition:   Return in about 4 weeks (around 02/17/2023) for echo and f/u.    Total time spent: 30 minutes  Signed,  Neoma Laming, MD  01/20/2023 9:44 AM    [place]

## 2023-02-15 ENCOUNTER — Ambulatory Visit (INDEPENDENT_AMBULATORY_CARE_PROVIDER_SITE_OTHER): Payer: Medicare HMO

## 2023-02-15 DIAGNOSIS — I739 Peripheral vascular disease, unspecified: Secondary | ICD-10-CM

## 2023-02-15 DIAGNOSIS — I5023 Acute on chronic systolic (congestive) heart failure: Secondary | ICD-10-CM

## 2023-02-15 DIAGNOSIS — I519 Heart disease, unspecified: Secondary | ICD-10-CM

## 2023-02-15 DIAGNOSIS — I10 Essential (primary) hypertension: Secondary | ICD-10-CM | POA: Diagnosis not present

## 2023-02-16 ENCOUNTER — Other Ambulatory Visit: Payer: Medicare HMO

## 2023-02-20 ENCOUNTER — Encounter: Payer: Self-pay | Admitting: Cardiovascular Disease

## 2023-02-20 ENCOUNTER — Ambulatory Visit (INDEPENDENT_AMBULATORY_CARE_PROVIDER_SITE_OTHER): Payer: Medicare HMO | Admitting: Cardiovascular Disease

## 2023-02-20 VITALS — BP 122/80 | HR 110 | Ht 73.0 in | Wt 305.0 lb

## 2023-02-20 DIAGNOSIS — I5023 Acute on chronic systolic (congestive) heart failure: Secondary | ICD-10-CM

## 2023-02-20 DIAGNOSIS — I739 Peripheral vascular disease, unspecified: Secondary | ICD-10-CM | POA: Diagnosis not present

## 2023-02-20 DIAGNOSIS — I249 Acute ischemic heart disease, unspecified: Secondary | ICD-10-CM | POA: Diagnosis not present

## 2023-02-20 DIAGNOSIS — I10 Essential (primary) hypertension: Secondary | ICD-10-CM | POA: Diagnosis not present

## 2023-02-20 DIAGNOSIS — R000 Tachycardia, unspecified: Secondary | ICD-10-CM

## 2023-02-20 MED ORDER — METOPROLOL TARTRATE 100 MG PO TABS: 100.0000 mg | ORAL_TABLET | Freq: Two times a day (BID) | ORAL | 11 refills | Status: DC

## 2023-02-20 NOTE — Progress Notes (Addendum)
Cardiology Office Note   Date:  02/20/2023   ID:  Ronald Horne, DOB [birth year], MRN [id number]  PCP:  Sherrie Mustache, MD  Cardiologist:  Adrian Blackwater, MD      History of Present Illness: Ronald Horne is a 72 y.o. male who presents for  Chief Complaint  Patient presents with   Follow-up    4 week echo results    Occasional SOB on exertion. Heart rate 112. But no palpitation.      Past Medical History:  Diagnosis Date   Arthritis    Cardiomyopathy    a. 08/2016 Echo: EF 30-35%, diff HK; b. 03/2018 Ex MV (Jadali): Ex time 2:00, large anter defect, EF 24%; c. 03/2018 Echo: EF 20-25%, diff HK. Gr1 DD; d. Refused cath.   Chronic back pain    Chronic combined systolic (congestive) and diastolic (congestive) heart failure    a. 08/2016 Echo: EF 30-35%, diff HK, mild to mod MR, mild BAE; b. 03/2018 Echo: EF 20-25%, diff HK. Gr1 DD. MIldly dil Ao root. Mild BAE. Mildly reduced RV fxn.   GERD (gastroesophageal reflux disease)    Gout    Hypertension    Obesity      Past Surgical History:  Procedure Laterality Date   EPIDURAL BLOCK INJECTION     chronic back pain    LEFT HEART CATH AND CORONARY ANGIOGRAPHY Left 12/27/2019   Procedure: LEFT HEART CATH AND CORONARY ANGIOGRAPHY;  Surgeon: Laurier Nancy, MD;  Location: [facility] INVASIVE CV [facility];  Service: Cardiovascular;  Laterality: Left;   TONSILLECTOMY       Current Outpatient Medications  Medication Sig Dispense Refill   ascorbic acid (VITAMIN C) 500 MG tablet Take 500 mg by mouth daily.     atorvastatin (LIPITOR) 10 MG tablet Take 1 tablet (10 mg total) by mouth daily. 30 tablet 2   CORLANOR 7.5 MG TABS tablet Take 7.5 mg by mouth 2 (two) times daily.     ENTRESTO 97-103 MG Take 1 tablet by mouth 2 (two) times daily.     ergocalciferol (VITAMIN D2) 1.25 MG (50000 UT) capsule Take 50,000 Units by mouth once a week.     FARXIGA 10 MG TABS tablet Take 10 mg by mouth daily.     furosemide (LASIX) 40 MG tablet Take 1 tablet  (40 mg total) by mouth daily. 90 tablet 3   methimazole (TAPAZOLE) 5 MG tablet Take 7.5 mg by mouth daily.     metoprolol tartrate (LOPRESSOR) 100 MG tablet Take 1 tablet (100 mg total) by mouth 2 (two) times daily. 60 tablet 11   spironolactone (ALDACTONE) 25 MG tablet Take 12.5 mg by mouth daily.     aspirin 81 MG tablet Take 1 tablet (81 mg total) by mouth daily. (Patient not taking: Reported on 10/06/2022) 30 tablet 2   No current facility-administered medications for this visit.   Facility-Administered Medications Ordered in Other Visits  Medication Dose Route Frequency Provider Last Rate Last Admin   sodium chloride flush (NS) 0.9 % injection 3 mL  3 mL Intravenous Q12H Laurier Nancy, MD        Allergies:   Patient has no known allergies.    Social History:   reports that he quit smoking about 46 years ago. His smoking use included cigarettes. He has a 3.00 pack-year smoking history. He has never used smokeless tobacco. He reports that he does not drink alcohol and does not use drugs.   Family History:  family  history is not on file.    ROS:     Review of Systems  Constitutional: Negative.   HENT: Negative.    Eyes: Negative.   Respiratory: Negative.    Gastrointestinal: Negative.   Genitourinary: Negative.   Musculoskeletal: Negative.   Skin: Negative.   Neurological: Negative.   Endo/Heme/Allergies: Negative.   Psychiatric/Behavioral: Negative.    All other systems reviewed and are negative.     All other systems are reviewed and negative.    PHYSICAL EXAM: VS:  BP 122/80   Pulse (!) 110   Ht  (1.854 m)   Wt (!) 305 lb (138.3 kg)   SpO2 98%   BMI 40.24 kg/m  , BMI Body mass index is 40.24 kg/m. Last weight:  Wt Readings from Last 3 Encounters:  02/20/23 (!) 305 lb (138.3 kg)  01/20/23 (!) 316 lb 6.4 oz (143.5 kg)  10/06/22 (!) 307 lb (139.3 kg)     Physical Exam Vitals reviewed.  Constitutional:      Appearance: Normal appearance. He is  normal weight.  HENT:     Head: Normocephalic.     Nose: Nose normal.     Mouth/Throat:     Mouth: Mucous membranes are moist.  Eyes:     Pupils: Pupils are equal, round, and reactive to light.  Cardiovascular:     Rate and Rhythm: Normal rate and regular rhythm.     Pulses: Normal pulses.     Heart sounds: Normal heart sounds.  Pulmonary:     Effort: Pulmonary effort is normal.  Abdominal:     General: Abdomen is flat. Bowel sounds are normal.  Musculoskeletal:        General: Normal range of motion.     Cervical back: Normal range of motion.  Skin:    General: Skin is warm.  Neurological:     General: No focal deficit present.     Mental Status: He is alert.  Psychiatric:        Mood and Affect: Mood normal.       EKG: NSR 92/min LVH  Recent Labs: 07/30/2022: ALT 67; B Natriuretic Peptide 591.4; TSH 0.376 08/01/2022: BUN 24; Creatinine, Ser 1.10; Hemoglobin 14.6; Magnesium 2.1; Platelets 187; Potassium 3.8; Sodium 140    Lipid Panel No results found for: "CHOL", "TRIG", "HDL", "CHOLHDL", "VLDL", "LDLCALC", "LDLDIRECT"    Other studies Reviewed: Additional studies/ records that were reviewed today include:  Review of the above records demonstrates:      04/12/2018   11:13 AM  PAD Screen  Previous PAD dx? No  Previous surgical procedure? No  Pain with walking? No  Feet/toe relief with dangling? No  Painful, non-healing ulcers? No  Extremities discolored? No      ASSESSMENT AND PLAN:    ICD-10-CM   1. Acute coronary syndrome  I24.9     2. Acute on chronic systolic CHF (congestive heart failure)  I50.23     3. Primary hypertension  I10     4. PAD (peripheral artery disease)  I73.9     5. Sinus tachycardia  R00.0    Go up to 100 mg of metoprolol.       Problem List Items Addressed This Visit       Cardiovascular and Mediastinum   Acute coronary syndrome - Primary   Relevant Medications   metoprolol tartrate (LOPRESSOR) 100 MG tablet   Acute  on chronic systolic CHF (congestive heart failure)   Relevant Medications   metoprolol  tartrate (LOPRESSOR) 100 MG tablet   Hypertension   Relevant Medications   metoprolol tartrate (LOPRESSOR) 100 MG tablet   PAD (peripheral artery disease)   Relevant Medications   metoprolol tartrate (LOPRESSOR) 100 MG tablet   Other Visit Diagnoses     Sinus tachycardia       Go up to 100 mg of metoprolol.          Disposition:   Return in about 4 weeks (around 03/20/2023).    Total time spent: 30 minutes  Signed,  Adrian Blackwater, MD  02/20/2023 9:47 AM    [facility]

## 2023-03-09 ENCOUNTER — Ambulatory Visit: Payer: Medicare HMO | Attending: Sports Medicine

## 2023-03-09 DIAGNOSIS — M542 Cervicalgia: Secondary | ICD-10-CM

## 2023-03-09 DIAGNOSIS — M25511 Pain in right shoulder: Secondary | ICD-10-CM

## 2023-03-09 NOTE — Therapy (Signed)
[facility] [address] [place], [address] Phone: [phone]   Fax:  [phone]  Physical Therapy Evaluation  Patient Details  Name: Ronald Horne MRN: [id number] Date of Birth: Jan 21, 1951 Referring Provider (PT): Dorthula Nettles, DO   Encounter Date: 03/09/2023   PT End of Session - 03/09/23 0731     Visit Number 1    Number of Visits 17    Date for PT Re-Evaluation 05/05/23    PT Start Time 0732    PT Stop Time 0816    PT Time Calculation (min) 44 min    Activity Tolerance Patient tolerated treatment well    Behavior During Therapy [facility] for tasks assessed/performed             Past Medical History:  Diagnosis Date   Arthritis    Cardiomyopathy (HCC)    a. 08/2016 Echo: EF 30-35%, diff HK; b. 03/2018 Ex MV Dario Guardian): Ex time 2:00, large anter defect, EF 24%; c. 03/2018 Echo: EF 20-25%, diff HK. Gr1 DD; d. Refused cath.   Chronic back pain    Chronic combined systolic (congestive) and diastolic (congestive) heart failure (HCC)    a. 08/2016 Echo: EF 30-35%, diff HK, mild to mod MR, mild BAE; b. 03/2018 Echo: EF 20-25%, diff HK. Gr1 DD. MIldly dil Ao root. Mild BAE. Mildly reduced RV fxn.   GERD (gastroesophageal reflux disease)    Gout    Hypertension    Obesity     Past Surgical History:  Procedure Laterality Date   EPIDURAL BLOCK INJECTION     chronic back pain    LEFT HEART CATH AND CORONARY ANGIOGRAPHY Left 12/27/2019   Procedure: LEFT HEART CATH AND CORONARY ANGIOGRAPHY;  Surgeon: Laurier Nancy, MD;  Location: [facility] INVASIVE CV LAB;  Service: Cardiovascular;  Laterality: Left;   TONSILLECTOMY      There were no vitals filed for this visit.    Subjective Assessment - 03/09/23 0733     Subjective R shoulder joint pain: 6/10 currently, 8/10 at most for the past month. R lateral neck: 5/10 currently, 7/10 at worst for the past month (feels like throbbing).    Pertinent History R shoulder pain. MD  wants pt to try PT first before getting the shots. R shoulder pain has been going on for about a month or so. Might be due to his bed. Pt was also walks his dog and his dog tugs on his shoulder which might have bothered it. Pain might have also been lifting water from his car. Pain is off and on, today it might be 9/10, tomorrow might be 5/10.  Pt is R hand dominant. 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 Sees a heart specialist regularly, about every 3 weeks now. Main area of pain is anterior and lateral shoulder joint. Also has pain in the R lateral neck area.    Patient Stated Goals Be able to reach back and don and doff clothes more comfortably.    Currently in Pain? Yes    Pain Score 6     Pain Location Shoulder    Pain Orientation Right    Pain Descriptors / Indicators Aching;Sore;Throbbing    Pain Type Acute pain    Pain Onset More than a month ago    Pain Frequency Constant    Aggravating Factors  R shoulder joint: raising his arm up, reaching behind his back,  toileting. R lateral neck: laying in bed (pt sleeps on sides, stomach, back)    Pain Relieving Factors R shoulder joint: tramadol, menthol cream. Neck: heat helped temporarily.                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   Hand Dominance Right    Prior Therapy Yes for L shoulder      Precautions   Precaution Comments Hx of Cardiomyopathy, CHF      Restrictions   Other Position/Activity Restrictions No known restrictions      Posture/Postural Control   Posture Comments  forward neck, B protracted shoulders, R shoulder lower, kyphosis, R lateral shift, R thoracolumbar rotation, movement crease around C5/C6 area      AROM   Right Shoulder Flexion 82 Degrees   with anterior lateral shoulder pain   Right Shoulder ABduction 62 Degrees   with anterior lateral shoulder pain.   Right Shoulder Internal Rotation --   functional IR: R thumb to sacrum   Right Shoulder External Rotation --   Functional ER: R middle finger to around L T3 transverse process.   Cervical Flexion WFL with decreased R lateral neck pain    Cervical Extension WFL with L latearl neck tightness    Cervical - Right Side Bend limited with R lateral neck pain    Cervical - Left Side Bend limited with R lateral neck strech    Cervical - Right Rotation very limited with L lateral neck pain    Cervical - Left Rotation limited with L lateral neck pain      Strength   Overall Strength Comments R lower trap (manually resisted scapular retraction) 4/5    Right Shoulder Flexion 4/5    Right Shoulder ABduction 4-/5   with pain   Right Shoulder Internal Rotation 4/5    Right Shoulder External Rotation 4-/5                        Objective measurements completed on examination: See above findings.    Blood pressure, L arm sitting, mechanically taken, normal cuff: 119/72 HR 94   Precautions: hx of CHF  Decreased R shoulder pain with flexion with scapular retraction   (+) empty can and Hawkins kennedy R shoulder  Demonstrates scapular compensation   Obtain FOTO next visit   Response to treatment Pt tolerated session well without aggravation of symptoms.     Clinical impression Pt is a 72 year old male who came to physical therapy secondary to R shoulder pain. He also presents with altered posture and UE mechanics, R scapular and shoulder weakness, positive special tests suggesting R shoulder impingement and supraspinatus involvement, R lateral neck pain with reproduction of  lateral neck symptoms with cervical AROM, and difficulty performing tasks which involve reaching up as well as behind him, and well as lying in bed secondary to R shoulder and R lateral neck pain. Pt will benefit from skilled physical therapy services to address the aforementioned deficits.  PT Education - 03/09/23 1610     Education Details POC    Person(s) Educated Patient    Methods Explanation    Comprehension Verbalized understanding              PT Short Term Goals - 03/09/23 1600       PT SHORT TERM GOAL #1   Title Pt will be independent with his initial HEP to decrease pain, improve shoulder AROM, strength, function, and ability to reach more comfortably.    Time 3    Period Weeks    Status New    Target Date 03/31/23               PT Long Term Goals - 03/09/23 1602       PT LONG TERM GOAL #1   Title Pt will have a decrease in R shoulder pain to 4/10 or less at worst to promote ability to reach up, reach behind, as well as don and doff clothes more comfortably.    Baseline 8-9/10 R shoulder joint pain at worst for the past month based on pt interview (03/09/2023)    Time 8    Period Weeks    Status New    Target Date 05/05/23      PT LONG TERM GOAL #2   Title Pt will have a decrease in R lateral neck pain to 3/10 or less at worst to promote ability to reach, look around more comfortably.    Baseline 7-9/10 R lateral neck pain at worst for the past month (feels like throbbing)  based on patient interview (03/09/2023)    Time 8    Period Weeks    Status New    Target Date 05/05/23      PT LONG TERM GOAL #3   Title Pt will improve his R shoulder FOTO score by at least 10 points as a demonstration of improved function.    Baseline Unable to obtain FOTO at this time. Will try next visit (03/09/2023)    Time 8    Period Weeks    Status New    Target Date 05/05/23      PT LONG TERM GOAL #4   Title Pt will improve R shoulder  flexion and abduction AROM by at least 20 degrees to promote ability to reach up more comfortably.    Baseline R shoulder AROM: 82 degrees flexion with pain, 62 degrees abduction with pain (03/09/2023)    Time 8    Period Weeks    Status New    Target Date 05/05/23      PT LONG TERM GOAL #5   Title Pt will improve R shoulder functional IR to R thumb to T12 spinous process to promote ability to don and doff clothes more comfortably.    Baseline R shoulder functional IR: R thumb to sacrum (03/09/2023)    Time 8    Period Weeks    Status New    Target Date 05/05/23                    Plan - 03/09/23 1556     Clinical Impression Statement Pt is a 72 year old male who came to physical therapy secondary to R shoulder pain. He also presents with altered posture and UE mechanics, R scapular and shoulder weakness, positive special tests suggesting R shoulder impingement and supraspinatus involvement, R lateral neck pain with reproduction of lateral neck symptoms with cervical AROM, and difficulty performing tasks  which involve reaching up as well as behind him, and well as lying in bed secondary to R shoulder and R lateral neck pain. Pt will benefit from skilled physical therapy services to address the aforementioned deficits.    Personal Factors and Comorbidities Age;Fitness;Time since onset of injury/illness/exacerbation;Comorbidity 3+    Comorbidities Arthritis, cardiomyopathy, chronic back pain, CHF, HTN, obesity    Examination-Activity Limitations Dressing;Bathing;Transfers;Bed Mobility;Hygiene/Grooming;Sleep;Lift;Caring for Others;Carry;Reach Overhead;Toileting    Stability/Clinical Decision Making Stable/Uncomplicated    Clinical Decision Making Low    Rehab Potential Fair    PT Frequency 2x / week    PT Duration 8 weeks    PT Treatment/Interventions Therapeutic activities;Therapeutic exercise;Neuromuscular re-education;Patient/family education;Manual techniques;Dry  needling;Electrical Stimulation;Iontophoresis 4mg /ml Dexamethasone    PT Next Visit Plan posture, scapular and ER strengthening, improve glenohumeral mechanics, manual techniques, modalities PRN    Consulted and Agree with Plan of Care Patient             Patient will benefit from skilled therapeutic intervention in order to improve the following deficits and impairments:  Pain, Postural dysfunction, Improper body mechanics, Impaired UE functional use, Decreased strength, Decreased range of motion  Visit Diagnosis: Right shoulder pain, unspecified chronicity - Plan: PT plan of care cert/re-cert  Cervicalgia - Plan: PT plan of care cert/re-cert     Problem List Patient Active Problem List   Diagnosis Date Noted   PAD (peripheral artery disease) (HCC) 09/02/2022   Acute respiratory failure with hypoxia (HCC) 07/31/2022   CHF exacerbation (HCC) 07/30/2022   Acute on chronic systolic CHF (congestive heart failure) (HCC) 07/30/2022   Hypokalemia 07/30/2022   Hyperglycemia 07/30/2022   Elevated troponin 07/30/2022   Obesity    Hypertension    GERD (gastroesophageal reflux disease)    Hyperthyroidism    Acute coronary syndrome (HCC) 11/28/2019   Loralyn Freshwater PT, DPT  03/09/2023, 4:15 PM  [facility] [address], [place], [address] Phone: [phone]   Fax:  [phone]  Name: Ronald Horne MRN: [id number] Date of Birth: 1951-06-17

## 2023-03-14 ENCOUNTER — Ambulatory Visit: Payer: Medicare HMO

## 2023-03-14 DIAGNOSIS — M542 Cervicalgia: Secondary | ICD-10-CM

## 2023-03-14 DIAGNOSIS — M25511 Pain in right shoulder: Secondary | ICD-10-CM | POA: Diagnosis not present

## 2023-03-14 NOTE — Therapy (Signed)
OUTPATIENT PHYSICAL THERAPY TREATMENT NOTE   Patient Name: Ronald Horne MRN: [id number] DOB:09/30/1951, 72 y.o., male Today's Date: 03/14/2023  PCP: Sherrie Mustache, MD  REFERRING PROVIDER: Jerrel Ivory, DO   END OF SESSION:  PT End of Session - 03/14/23 0733     Visit Number 2    Number of Visits 17    Date for PT Re-Evaluation 05/05/23    PT Start Time 0733    PT Stop Time 0803    PT Time Calculation (min) 30 min    Activity Tolerance Patient tolerated treatment well    Behavior During Therapy [facility] for tasks assessed/performed             Past Medical History:  Diagnosis Date   Arthritis    Cardiomyopathy (HCC)    a. 08/2016 Echo: EF 30-35%, diff HK; b. 03/2018 Ex MV ([facility]): Ex time 2:00, large anter defect, EF 24%; c. 03/2018 Echo: EF 20-25%, diff HK. Gr1 DD; d. Refused cath.   Chronic back pain    Chronic combined systolic (congestive) and diastolic (congestive) heart failure (HCC)    a. 08/2016 Echo: EF 30-35%, diff HK, mild to mod MR, mild BAE; b. 03/2018 Echo: EF 20-25%, diff HK. Gr1 DD. MIldly dil Ao root. Mild BAE. Mildly reduced RV fxn.   GERD (gastroesophageal reflux disease)    Gout    Hypertension    Obesity    Past Surgical History:  Procedure Laterality Date   EPIDURAL BLOCK INJECTION     chronic back pain    LEFT HEART CATH AND CORONARY ANGIOGRAPHY Left 12/27/2019   Procedure: LEFT HEART CATH AND CORONARY ANGIOGRAPHY;  Surgeon: Laurier Nancy, MD;  Location: [facility] INVASIVE CV LAB;  Service: Cardiovascular;  Laterality: Left;   TONSILLECTOMY     Patient Active Problem List   Diagnosis Date Noted   PAD (peripheral artery disease) (HCC) 09/02/2022   Acute respiratory failure with hypoxia (HCC) 07/31/2022   CHF exacerbation (HCC) 07/30/2022   Acute on chronic systolic CHF (congestive heart failure) (HCC) 07/30/2022   Hypokalemia 07/30/2022   Hyperglycemia 07/30/2022   Elevated troponin 07/30/2022   Obesity    Hypertension    GERD  (gastroesophageal reflux disease)    Hyperthyroidism    Acute coronary syndrome (HCC) 11/28/2019    REFERRING DIAG: M25.511 (ICD-10-CM) - Pain in right shoulder M75.41 (ICD-10-CM) - Impingement syndrome of right shoulder M19.011 (ICD-10-CM) - Primary osteoarthritis, right shoulder M77.8 (ICD-10-CM) - Other enthesopathies, not elsewhere classified   THERAPY DIAG:  Right shoulder pain, unspecified chronicity  Cervicalgia  Rationale for Evaluation and Treatment Rehabilitation  PERTINENT HISTORY: R shoulder pain. MD wants pt to try PT first before getting the shots. R shoulder pain has been going on for about a month or so. Might be due to his bed. Pt was also walks his dog and his dog tugs on his shoulder which might have bothered it. Pain might have also been lifting water from his car. Pain is off and on, today it might be 9/10, tomorrow might be 5/10. Pt is R hand dominant. 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 Sees a heart specialist regularly, about every 3 weeks now. Main area of pain is anterior and lateral shoulder joint. Also has pain in the R lateral neck area.   PRECAUTIONS: Hx of Cardiomyopathy, CHF   SUBJECTIVE:   SUBJECTIVE STATEMENT: R shoulder is aggravating him. Feels like he needs an  x-ray. 7/10 R shoulder joint pain currently, 8/10 when he wakes up. 7/10 R lateral neck currently. Has to leave early to make it to the dentist by 8:30 am.   PAIN:  Are you having pain? See subjective   TODAY'S TREATMENT:                                                                                                                                         DATE: 03/14/2023  Precautions: hx of CHF   Decreased R shoulder pain with flexion with scapular retraction    (+) empty can and Hawkins kennedy R shoulder   Demonstrates scapular compensation    No latex allergies     Therapeutic exercise  Seated B shoulder ER yellow  band 10x3  Seated B scapular retraction 10x3 with 5 second holds  R first rib stretch with strap   With L cervical side bend 30 seconds x 3  With R and L cervical rotation 10x3    Improved exercise technique, movement at target joints, use of target muscles after mod verbal, visual, tactile cues.     Obtain FOTO next visit     Response to treatment Pt tolerated session well without aggravation of symptoms. Neck is not as tight after session reported. Improved R shoulder comfort when rasing his arm up with addition os scapular retraction.        Clinical impression Session ended early secondary to pt having another appointment right after this one. Worked on improving proper scapular movement, infraspinatus muscle activation and decreasing R upper trap and scalene muscle tension to decrease stress to neck and R shoulder. Pt tolerated session well without aggravation of symptoms. Pt will benefit from continued skilled physical therapy services to decrease pain, improve strength and function.      PATIENT EDUCATION: Education details: there-ex, HEP Person educated: Patient Education method: Explanation, Demonstration, Tactile cues, Verbal cues, and Handouts Education comprehension: verbalized understanding and returned demonstration  HOME EXERCISE PROGRAM: Access Code: [email or web address] URL: [email or web address] Date: 03/14/2023 Prepared by: Loralyn Freshwater  Exercises - Standing Shoulder External Rotation with Resistance  - 1 x daily - 7 x weekly - 3 sets - 10 reps       PT Short Term Goals - 03/09/23 1600       PT SHORT TERM GOAL #1   Title Pt will be independent with his initial HEP to decrease pain, improve shoulder AROM, strength, function, and ability to reach more comfortably.    Time 3    Period Weeks    Status New    Target Date 03/31/23              PT Long Term Goals - 03/09/23 1602       PT LONG TERM GOAL #1   Title Pt will have a decrease  in R shoulder pain to 4/10 or less at worst to promote  ability to reach up, reach behind, as well as don and doff clothes more comfortably.    Baseline 8-9/10 R shoulder joint pain at worst for the past month based on pt interview (03/09/2023)    Time 8    Period Weeks    Status New    Target Date 05/05/23      PT LONG TERM GOAL #2   Title Pt will have a decrease in R lateral neck pain to 3/10 or less at worst to promote ability to reach, look around more comfortably.    Baseline 7-9/10 R lateral neck pain at worst for the past month (feels like throbbing)  based on patient interview (03/09/2023)    Time 8    Period Weeks    Status New    Target Date 05/05/23      PT LONG TERM GOAL #3   Title Pt will improve his R shoulder FOTO score by at least 10 points as a demonstration of improved function.    Baseline Unable to obtain FOTO at this time. Will try next visit (03/09/2023)    Time 8    Period Weeks    Status New    Target Date 05/05/23      PT LONG TERM GOAL #4   Title Pt will improve R shoulder flexion and abduction AROM by at least 20 degrees to promote ability to reach up more comfortably.    Baseline R shoulder AROM: 82 degrees flexion with pain, 62 degrees abduction with pain (03/09/2023)    Time 8    Period Weeks    Status New    Target Date 05/05/23      PT LONG TERM GOAL #5   Title Pt will improve R shoulder functional IR to R thumb to T12 spinous process to promote ability to don and doff clothes more comfortably.    Baseline R shoulder functional IR: R thumb to sacrum (03/09/2023)    Time 8    Period Weeks    Status New    Target Date 05/05/23              Plan - 03/14/23 0732     Clinical Impression Statement Session ended early secondary to pt having another appointment right after this one. Worked on improving proper scapular movement, infraspinatus muscle activation and decreasing R upper trap and scalene muscle tension to decrease stress to neck and R shoulder.  Pt tolerated session well without aggravation of symptoms. Pt will benefit from continued skilled physical therapy services to decrease pain, improve strength and function.    Personal Factors and Comorbidities Age;Fitness;Time since onset of injury/illness/exacerbation;Comorbidity 3+    Comorbidities Arthritis, cardiomyopathy, chronic back pain, CHF, HTN, obesity    Examination-Activity Limitations Dressing;Bathing;Transfers;Bed Mobility;Hygiene/Grooming;Sleep;Lift;Caring for Others;Carry;Reach Overhead;Toileting    Stability/Clinical Decision Making Stable/Uncomplicated    Rehab Potential Fair    PT Frequency 2x / week    PT Duration 8 weeks    PT Treatment/Interventions Therapeutic activities;Therapeutic exercise;Neuromuscular re-education;Patient/family education;Manual techniques;Dry needling;Electrical Stimulation;Iontophoresis 4mg /ml Dexamethasone    PT Next Visit Plan posture, scapular and ER strengthening, improve glenohumeral mechanics, manual techniques, modalities PRN    Consulted and Agree with Plan of Care Patient              Loralyn Freshwater PT, DPT   03/14/2023, 1:36 PM

## 2023-03-17 ENCOUNTER — Ambulatory Visit: Payer: Medicare HMO

## 2023-03-17 DIAGNOSIS — M25511 Pain in right shoulder: Secondary | ICD-10-CM | POA: Diagnosis not present

## 2023-03-17 DIAGNOSIS — M542 Cervicalgia: Secondary | ICD-10-CM

## 2023-03-17 NOTE — Therapy (Signed)
OUTPATIENT PHYSICAL THERAPY TREATMENT NOTE   Patient Name: Ronald Horne MRN: [id number] DOB:1951/08/07, 72 y.o., male Today's Date: 03/17/2023  PCP: Sherrie Mustache, MD  REFERRING PROVIDER: Jerrel Ivory, DO   END OF SESSION:  PT End of Session - 03/17/23 0821     Visit Number 3    Number of Visits 17    Date for PT Re-Evaluation 05/05/23    PT Start Time 0821    PT Stop Time 0901    PT Time Calculation (min) 40 min    Activity Tolerance Patient tolerated treatment well    Behavior During Therapy [facility] for tasks assessed/performed              Past Medical History:  Diagnosis Date   Arthritis    Cardiomyopathy (HCC)    a. 08/2016 Echo: EF 30-35%, diff HK; b. 03/2018 Ex MV ([facility]): Ex time 2:00, large anter defect, EF 24%; c. 03/2018 Echo: EF 20-25%, diff HK. Gr1 DD; d. Refused cath.   Chronic back pain    Chronic combined systolic (congestive) and diastolic (congestive) heart failure (HCC)    a. 08/2016 Echo: EF 30-35%, diff HK, mild to mod MR, mild BAE; b. 03/2018 Echo: EF 20-25%, diff HK. Gr1 DD. MIldly dil Ao root. Mild BAE. Mildly reduced RV fxn.   GERD (gastroesophageal reflux disease)    Gout    Hypertension    Obesity    Past Surgical History:  Procedure Laterality Date   EPIDURAL BLOCK INJECTION     chronic back pain    LEFT HEART CATH AND CORONARY ANGIOGRAPHY Left 12/27/2019   Procedure: LEFT HEART CATH AND CORONARY ANGIOGRAPHY;  Surgeon: Laurier Nancy, MD;  Location: [facility] INVASIVE CV LAB;  Service: Cardiovascular;  Laterality: Left;   TONSILLECTOMY     Patient Active Problem List   Diagnosis Date Noted   PAD (peripheral artery disease) (HCC) 09/02/2022   Acute respiratory failure with hypoxia (HCC) 07/31/2022   CHF exacerbation (HCC) 07/30/2022   Acute on chronic systolic CHF (congestive heart failure) (HCC) 07/30/2022   Hypokalemia 07/30/2022   Hyperglycemia 07/30/2022   Elevated troponin 07/30/2022   Obesity    Hypertension    GERD  (gastroesophageal reflux disease)    Hyperthyroidism    Acute coronary syndrome (HCC) 11/28/2019    REFERRING DIAG: M25.511 (ICD-10-CM) - Pain in right shoulder M75.41 (ICD-10-CM) - Impingement syndrome of right shoulder M19.011 (ICD-10-CM) - Primary osteoarthritis, right shoulder M77.8 (ICD-10-CM) - Other enthesopathies, not elsewhere classified   THERAPY DIAG:  Right shoulder pain, unspecified chronicity  Cervicalgia  Rationale for Evaluation and Treatment Rehabilitation  PERTINENT HISTORY: R shoulder pain. MD wants pt to try PT first before getting the shots. R shoulder pain has been going on for about a month or so. Might be due to his bed. Pt was also walks his dog and his dog tugs on his shoulder which might have bothered it. Pain might have also been lifting water from his car. Pain is off and on, today it might be 9/10, tomorrow might be 5/10. Pt is R hand dominant. 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 Sees a heart specialist regularly, about every 3 weeks now. Main area of pain is anterior and lateral shoulder joint. Also has pain in the R lateral neck area.   PRECAUTIONS: Hx of Cardiomyopathy, CHF   SUBJECTIVE:   SUBJECTIVE STATEMENT: Got a little winded the past 2 days. Cardiologist  told him to stop and slow down if he gets winded. R shoulder is about the same, gets tightness 7/10 R superior lateral shoulder pain when raising it up.        PAIN:  Are you having pain? See subjective   TODAY'S TREATMENT:                                                                                                                                         DATE: 03/17/2023  Precautions: hx of CHF   Decreased R shoulder pain with flexion with scapular retraction    (+) empty can and Hawkins kennedy R shoulder   Demonstrates scapular compensation    No latex allergies    Manual therapy  Seated STM R upper trap and  distal scalene muscle area to decrease tension  Seated STM R distal pectoralis muscle to decrease tension   Decreased R shoulder pain with flexion afterwards    Therapeutic exercise   Seated B scapular retraction 10x3 with 5 second holds  Seated R shoulder ER isometrics 10x5 seconds, then 4x5 seconds with PT manual resistance gentle   Seated cervical chin tucks 10x5 seconds for 2 sets to promote thoracic extension and decrease stress to lower cervical spine.    Therapeutic rest breaks provided secondary to fatigue    Improved exercise technique, movement at target joints, use of target muscles after mod verbal, visual, tactile cues.        Response to treatment Pt tolerated session well without aggravation of symptoms.  R shoulder feels better after session per pt.        Clinical impression Adjusted treatment to not be as difficult secondary to reports of being windend for the past couple of days. Worked on manual techniques to decrease muscle tension around his R shoulder. Also worked on ER strengthening and scapular mechanics to promote better glenohumeral movement. Worked on upper thoracic extension to decrease stress to lower cervical spine. R shoulder feeling better after session reported by pt. Pt will benefit from continued skilled physical therapy services to decrease pain, improve strength and function.      PATIENT EDUCATION: Education details: there-ex, HEP Person educated: Patient Education method: Explanation, Demonstration, Tactile cues, Verbal cues, and Handouts Education comprehension: verbalized understanding and returned demonstration  HOME EXERCISE PROGRAM: Access Code: [email or web address] URL: [email or web address] Date: 03/14/2023 Prepared by: Loralyn Freshwater  Exercises - Standing Shoulder External Rotation with Resistance  - 1 x daily - 7 x weekly - 3 sets - 10 reps       PT Short Term Goals - 03/09/23 1600       PT SHORT TERM GOAL  #1   Title Pt will be independent with his initial HEP to decrease pain, improve shoulder AROM, strength, function, and ability to reach more comfortably.    Time 3    Period Weeks    Status New  Target Date 03/31/23              PT Long Term Goals - 03/09/23 1602       PT LONG TERM GOAL #1   Title Pt will have a decrease in R shoulder pain to 4/10 or less at worst to promote ability to reach up, reach behind, as well as don and doff clothes more comfortably.    Baseline 8-9/10 R shoulder joint pain at worst for the past month based on pt interview (03/09/2023)    Time 8    Period Weeks    Status New    Target Date 05/05/23      PT LONG TERM GOAL #2   Title Pt will have a decrease in R lateral neck pain to 3/10 or less at worst to promote ability to reach, look around more comfortably.    Baseline 7-9/10 R lateral neck pain at worst for the past month (feels like throbbing)  based on patient interview (03/09/2023)    Time 8    Period Weeks    Status New    Target Date 05/05/23      PT LONG TERM GOAL #3   Title Pt will improve his R shoulder FOTO score by at least 10 points as a demonstration of improved function.    Baseline Unable to obtain FOTO at this time. Will try next visit (03/09/2023)    Time 8    Period Weeks    Status New    Target Date 05/05/23      PT LONG TERM GOAL #4   Title Pt will improve R shoulder flexion and abduction AROM by at least 20 degrees to promote ability to reach up more comfortably.    Baseline R shoulder AROM: 82 degrees flexion with pain, 62 degrees abduction with pain (03/09/2023)    Time 8    Period Weeks    Status New    Target Date 05/05/23      PT LONG TERM GOAL #5   Title Pt will improve R shoulder functional IR to R thumb to T12 spinous process to promote ability to don and doff clothes more comfortably.    Baseline R shoulder functional IR: R thumb to sacrum (03/09/2023)    Time 8    Period Weeks    Status New    Target Date  05/05/23              Plan - 03/17/23 0842     Clinical Impression Statement Adjusted treatment to not be as difficult secondary to reports of being windend for the past couple of days. Worked on manual techniques to decrease muscle tension around his R shoulder. Also worked on ER strengthening and scapular mechanics to promote better glenohumeral movement. Worked on upper thoracic extension to decrease stress to lower cervical spine. R shoulder feeling better after session reported by pt. Pt will benefit from continued skilled physical therapy services to decrease pain, improve strength and function.    Personal Factors and Comorbidities Age;Fitness;Time since onset of injury/illness/exacerbation;Comorbidity 3+    Comorbidities Arthritis, cardiomyopathy, chronic back pain, CHF, HTN, obesity    Examination-Activity Limitations Dressing;Bathing;Transfers;Bed Mobility;Hygiene/Grooming;Sleep;Lift;Caring for Others;Carry;Reach Overhead;Toileting    Stability/Clinical Decision Making Stable/Uncomplicated    Rehab Potential Fair    PT Frequency 2x / week    PT Duration 8 weeks    PT Treatment/Interventions Therapeutic activities;Therapeutic exercise;Neuromuscular re-education;Patient/family education;Manual techniques;Dry needling;Electrical Stimulation;Iontophoresis 4mg /ml Dexamethasone    PT Next Visit Plan posture, scapular  and ER strengthening, improve glenohumeral mechanics, manual techniques, modalities PRN    Consulted and Agree with Plan of Care Patient               Loralyn Freshwater PT, DPT   03/17/2023, 11:47 AM

## 2023-03-20 ENCOUNTER — Ambulatory Visit: Payer: Medicare HMO

## 2023-03-20 DIAGNOSIS — M542 Cervicalgia: Secondary | ICD-10-CM

## 2023-03-20 DIAGNOSIS — M25511 Pain in right shoulder: Secondary | ICD-10-CM | POA: Diagnosis not present

## 2023-03-20 NOTE — Therapy (Signed)
OUTPATIENT PHYSICAL THERAPY TREATMENT NOTE   Patient Name: Ronald Horne MRN: [id number] DOB:04/02/1951, 72 y.o., male Today's Date: 03/20/2023  PCP: Sherrie Mustache, MD  REFERRING PROVIDER: Jerrel Ivory, DO   END OF SESSION:  PT End of Session - 03/20/23 0737     Visit Number 4    Number of Visits 17    Date for PT Re-Evaluation 05/05/23    PT Start Time 0737   Pt arrived late   PT Stop Time 0818    PT Time Calculation (min) 41 min    Activity Tolerance Patient tolerated treatment well    Behavior During Therapy [facility] for tasks assessed/performed               Past Medical History:  Diagnosis Date   Arthritis    Cardiomyopathy (HCC)    a. 08/2016 Echo: EF 30-35%, diff HK; b. 03/2018 Ex MV (J[facility]): Ex time 2:00, large anter defect, EF 24%; c. 03/2018 Echo: EF 20-25%, diff HK. Gr1 DD; d. Refused cath.   Chronic back pain    Chronic combined systolic (congestive) and diastolic (congestive) heart failure (HCC)    a. 08/2016 Echo: EF 30-35%, diff HK, mild to mod MR, mild BAE; b. 03/2018 Echo: EF 20-25%, diff HK. Gr1 DD. MIldly dil Ao root. Mild BAE. Mildly reduced RV fxn.   GERD (gastroesophageal reflux disease)    Gout    Hypertension    Obesity    Past Surgical History:  Procedure Laterality Date   EPIDURAL BLOCK INJECTION     chronic back pain    LEFT HEART CATH AND CORONARY ANGIOGRAPHY Left 12/27/2019   Procedure: LEFT HEART CATH AND CORONARY ANGIOGRAPHY;  Surgeon: Laurier Nancy, MD;  Location: [facility] INVASIVE CV LAB;  Service: Cardiovascular;  Laterality: Left;   TONSILLECTOMY     Patient Active Problem List   Diagnosis Date Noted   PAD (peripheral artery disease) (HCC) 09/02/2022   Acute respiratory failure with hypoxia (HCC) 07/31/2022   CHF exacerbation (HCC) 07/30/2022   Acute on chronic systolic CHF (congestive heart failure) (HCC) 07/30/2022   Hypokalemia 07/30/2022   Hyperglycemia 07/30/2022   Elevated troponin 07/30/2022   Obesity     Hypertension    GERD (gastroesophageal reflux disease)    Hyperthyroidism    Acute coronary syndrome (HCC) 11/28/2019    REFERRING DIAG: M25.511 (ICD-10-CM) - Pain in right shoulder M75.41 (ICD-10-CM) - Impingement syndrome of right shoulder M19.011 (ICD-10-CM) - Primary osteoarthritis, right shoulder M77.8 (ICD-10-CM) - Other enthesopathies, not elsewhere classified   THERAPY DIAG:  Right shoulder pain, unspecified chronicity  Cervicalgia  Rationale for Evaluation and Treatment Rehabilitation  PERTINENT HISTORY: R shoulder pain. MD wants pt to try PT first before getting the shots. R shoulder pain has been going on for about a month or so. Might be due to his bed. Pt was also walks his dog and his dog tugs on his shoulder which might have bothered it. Pain might have also been lifting water from his car. Pain is off and on, today it might be 9/10, tomorrow might be 5/10. Pt is R hand dominant. 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 Sees a heart specialist regularly, about every 3 weeks now. Main area of pain is anterior and lateral shoulder joint. Also has pain in the R lateral neck area.   PRECAUTIONS: Hx of Cardiomyopathy, CHF   SUBJECTIVE:   SUBJECTIVE STATEMENT: Feels out of breath  from getting out of the car and to the room. No lightheadedness or dizziness. R shoulder and neck are stiff this morning, no pain unless he moves wrong.       PAIN:  Are you having pain? See subjective   TODAY'S TREATMENT:                                                                                                                                         DATE: 03/20/2023  Precautions: hx of CHF   Decreased R shoulder pain with flexion with scapular retraction    (+) empty can and Hawkins kennedy R shoulder   Demonstrates scapular compensation    No latex allergies    Manual therapy  Seated STM R upper trap and distal scalene  muscle area to decrease tension  Seated STM R distal pectoralis muscle to decrease tension   Decreased R shoulder pain with flexion afterwards    Therapeutic exercise   Blood pressure, L arm sitting, mechanically taken, normal cuff: 106/72, HR 104  Pt posture in sitting: pt leans to the R with R cervical side bend, and R shoulder shrug. Pt was recommended to maintain neutral posture to decrease stress to R lateral neck and shoulder.   Seated R upper trap stretch   30 seconds x 5  Decreased neck tightness reported  Pt states not being out of breath anymore  Seated R shoulder ER isometrics 10x5 seconds, for 2 sets with PT manual resistance gentle    Seated B scapular retraction 10x3 with 5 second holds  Pt states neck feels more loose    Therapeutic rest breaks provided secondary to fatigue    Improved exercise technique, movement at target joints, use of target muscles after mod verbal, visual, tactile cues.        Response to treatment Pt tolerated session well without aggravation of symptoms. Decreased R upper trap tightness reported after session.          Clinical impression Continue with light exercises secondary to his medical history. Continued working on manual techniques to decrease muscle tension around his R shoulder. Also worked on ER strengthening and scapular mechanics to promote better glenohumeral movement.  Pt tolerated session well without aggravation of symptoms. Decreased R upper trap tightness reported after session.  Pt will benefit from continued skilled physical therapy services to decrease pain, improve strength and function.      PATIENT EDUCATION: Education details: there-ex, HEP Person educated: Patient Education method: Explanation, Demonstration, Tactile cues, Verbal cues, and Handouts Education comprehension: verbalized understanding and returned demonstration  HOME EXERCISE PROGRAM: Access Code: [email or web address] URL:  [email or web address] Date: 03/14/2023 Prepared by: Loralyn Freshwater  Exercises - Standing Shoulder External Rotation with Resistance  - 1 x daily - 7 x weekly - 3 sets - 10 reps  - Seated Upper Trapezius Stretch  - 3 x daily - 7 x weekly -  1 sets - 5 reps - 30 seconds  hold - Seated Scapular Retraction  - 1 x daily - 7 x weekly - 3 sets - 10 reps - 5 seconds hold       PT Short Term Goals - 03/09/23 1600       PT SHORT TERM GOAL #1   Title Pt will be independent with his initial HEP to decrease pain, improve shoulder AROM, strength, function, and ability to reach more comfortably.    Time 3    Period Weeks    Status New    Target Date 03/31/23              PT Long Term Goals - 03/09/23 1602       PT LONG TERM GOAL #1   Title Pt will have a decrease in R shoulder pain to 4/10 or less at worst to promote ability to reach up, reach behind, as well as don and doff clothes more comfortably.    Baseline 8-9/10 R shoulder joint pain at worst for the past month based on pt interview (03/09/2023)    Time 8    Period Weeks    Status New    Target Date 05/05/23      PT LONG TERM GOAL #2   Title Pt will have a decrease in R lateral neck pain to 3/10 or less at worst to promote ability to reach, look around more comfortably.    Baseline 7-9/10 R lateral neck pain at worst for the past month (feels like throbbing)  based on patient interview (03/09/2023)    Time 8    Period Weeks    Status New    Target Date 05/05/23      PT LONG TERM GOAL #3   Title Pt will improve his R shoulder FOTO score by at least 10 points as a demonstration of improved function.    Baseline Unable to obtain FOTO at this time. Will try next visit (03/09/2023)    Time 8    Period Weeks    Status New    Target Date 05/05/23      PT LONG TERM GOAL #4   Title Pt will improve R shoulder flexion and abduction AROM by at least 20 degrees to promote ability to reach up more comfortably.    Baseline R  shoulder AROM: 82 degrees flexion with pain, 62 degrees abduction with pain (03/09/2023)    Time 8    Period Weeks    Status New    Target Date 05/05/23      PT LONG TERM GOAL #5   Title Pt will improve R shoulder functional IR to R thumb to T12 spinous process to promote ability to don and doff clothes more comfortably.    Baseline R shoulder functional IR: R thumb to sacrum (03/09/2023)    Time 8    Period Weeks    Status New    Target Date 05/05/23              Plan - 03/20/23 0737     Clinical Impression Statement Continue with light exercises secondary to his medical history. Continued working on manual techniques to decrease muscle tension around his R shoulder. Also worked on ER strengthening and scapular mechanics to promote better glenohumeral movement.  Pt tolerated session well without aggravation of symptoms. Decreased R upper trap tightness reported after session.  Pt will benefit from continued skilled physical therapy services to decrease pain, improve strength and  function.    Personal Factors and Comorbidities Age;Fitness;Time since onset of injury/illness/exacerbation;Comorbidity 3+    Comorbidities Arthritis, cardiomyopathy, chronic back pain, CHF, HTN, obesity    Examination-Activity Limitations Dressing;Bathing;Transfers;Bed Mobility;Hygiene/Grooming;Sleep;Lift;Caring for Others;Carry;Reach Overhead;Toileting    Stability/Clinical Decision Making Stable/Uncomplicated    Rehab Potential Fair    PT Frequency 2x / week    PT Duration 8 weeks    PT Treatment/Interventions Therapeutic activities;Therapeutic exercise;Neuromuscular re-education;Patient/family education;Manual techniques;Dry needling;Electrical Stimulation;Iontophoresis 4mg /ml Dexamethasone    PT Next Visit Plan posture, scapular and ER strengthening, improve glenohumeral mechanics, manual techniques, modalities PRN    Consulted and Agree with Plan of Care Patient               Loralyn Freshwater PT,  DPT   03/20/2023, 9:16 AM

## 2023-03-22 ENCOUNTER — Ambulatory Visit: Payer: Medicare HMO

## 2023-03-22 DIAGNOSIS — M25511 Pain in right shoulder: Secondary | ICD-10-CM

## 2023-03-22 DIAGNOSIS — M542 Cervicalgia: Secondary | ICD-10-CM

## 2023-03-22 NOTE — Therapy (Signed)
OUTPATIENT PHYSICAL THERAPY TREATMENT NOTE   Patient Name: Ronald Horne MRN: [id number] DOB:11/02/1950, 72 y.o., male Today's Date: 03/22/2023  PCP: Sherrie Mustache, MD  REFERRING PROVIDER: Jerrel Ivory, DO   END OF SESSION:  PT End of Session - 03/22/23 0731     Visit Number 5    Number of Visits 17    Date for PT Re-Evaluation 05/05/23    PT Start Time 0732    PT Stop Time 0811    PT Time Calculation (min) 39 min    Activity Tolerance Patient tolerated treatment well    Behavior During Therapy [facility] for tasks assessed/performed                Past Medical History:  Diagnosis Date   Arthritis    Cardiomyopathy (HCC)    a. 08/2016 Echo: EF 30-35%, diff HK; b. 03/2018 Ex MV ([facility]): Ex time 2:00, large anter defect, EF 24%; c. 03/2018 Echo: EF 20-25%, diff HK. Gr1 DD; d. Refused cath.   Chronic back pain    Chronic combined systolic (congestive) and diastolic (congestive) heart failure (HCC)    a. 08/2016 Echo: EF 30-35%, diff HK, mild to mod MR, mild BAE; b. 03/2018 Echo: EF 20-25%, diff HK. Gr1 DD. MIldly dil Ao root. Mild BAE. Mildly reduced RV fxn.   GERD (gastroesophageal reflux disease)    Gout    Hypertension    Obesity    Past Surgical History:  Procedure Laterality Date   EPIDURAL BLOCK INJECTION     chronic back pain    LEFT HEART CATH AND CORONARY ANGIOGRAPHY Left 12/27/2019   Procedure: LEFT HEART CATH AND CORONARY ANGIOGRAPHY;  Surgeon: Laurier Nancy, MD;  Location: [facility] INVASIVE CV LAB;  Service: Cardiovascular;  Laterality: Left;   TONSILLECTOMY     Patient Active Problem List   Diagnosis Date Noted   PAD (peripheral artery disease) (HCC) 09/02/2022   Acute respiratory failure with hypoxia (HCC) 07/31/2022   CHF exacerbation (HCC) 07/30/2022   Acute on chronic systolic CHF (congestive heart failure) (HCC) 07/30/2022   Hypokalemia 07/30/2022   Hyperglycemia 07/30/2022   Elevated troponin 07/30/2022   Obesity    Hypertension    GERD  (gastroesophageal reflux disease)    Hyperthyroidism    Acute coronary syndrome (HCC) 11/28/2019    REFERRING DIAG: M25.511 (ICD-10-CM) - Pain in right shoulder M75.41 (ICD-10-CM) - Impingement syndrome of right shoulder M19.011 (ICD-10-CM) - Primary osteoarthritis, right shoulder M77.8 (ICD-10-CM) - Other enthesopathies, not elsewhere classified   THERAPY DIAG:  Right shoulder pain, unspecified chronicity  Cervicalgia  Rationale for Evaluation and Treatment Rehabilitation  PERTINENT HISTORY: R shoulder pain. MD wants pt to try PT first before getting the shots. R shoulder pain has been going on for about a month or so. Might be due to his bed. Pt was also walks his dog and his dog tugs on his shoulder which might have bothered it. Pain might have also been lifting water from his car. Pain is off and on, today it might be 9/10, tomorrow might be 5/10. Pt is R hand dominant. 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 Sees a heart specialist regularly, about every 3 weeks now. Main area of pain is anterior and lateral shoulder joint. Also has pain in the R lateral neck area.   PRECAUTIONS: Hx of Cardiomyopathy, CHF   SUBJECTIVE:   SUBJECTIVE STATEMENT: R shoulder is trying to loosen up  now. R neck bothered him last night, ended up lying down on his R side. Can't sleep on one side the whole night. 5/10 R shoulder pain currently       PAIN:  Are you having pain? See subjective   TODAY'S TREATMENT:                                                                                                                                         DATE: 03/22/2023  Precautions: hx of CHF   Decreased R shoulder pain with flexion with scapular retraction    (+) empty can and Hawkins kennedy R shoulder   Demonstrates scapular compensation    No latex allergies    Therapeutic exercise   Blood pressure, L arm sitting, mechanically taken,  normal cuff: 117/78, HR 103  Seated R upper trap stretch   30 seconds x 5  Decreased neck tightness reported  Seated R shoulder ER isometrics 10x5 seconds, for 2 sets with PT manual resistance gentle   Seated B scapular retraction 10x3 with 5 second holds   Seated scapular retraction with   R shoulder flexion 2x   Seated R triceps extension isometrics on table 10x5 seconds for 2 sets  Seated R shoulder extension manually resisted from end range flexion 10x2  Decreased R shoulder tightness with flexion afterwards.    Therapeutic rest breaks provided secondary to fatigue    Improved exercise technique, movement at target joints, use of target muscles after mod verbal, visual, tactile cues.        Response to treatment Pt tolerated session well without aggravation of symptoms. Decreased R shoulder tightness reported after session.          Clinical impression  Focused more on gentle strengthening of scapular, posterior shoulder and ER muscles to decrease anterior shoulder and upper trap muscle tension. Decreased R shoulder tightness reported after session. Pt will benefit from continued skilled physical therapy services to decrease pain, improve strength and function.      PATIENT EDUCATION: Education details: there-ex, HEP Person educated: Patient Education method: Explanation, Demonstration, Tactile cues, Verbal cues, and Handouts Education comprehension: verbalized understanding and returned demonstration  HOME EXERCISE PROGRAM: Access Code: [email or web address] URL: [email or web address] Date: 03/14/2023 Prepared by: Loralyn Freshwater  Exercises - Standing Shoulder External Rotation with Resistance  - 1 x daily - 7 x weekly - 3 sets - 10 reps  - Seated Upper Trapezius Stretch  - 3 x daily - 7 x weekly - 1 sets - 5 reps - 30 seconds  hold - Seated Scapular Retraction  - 1 x daily - 7 x weekly - 3 sets - 10 reps - 5 seconds hold       PT Short Term Goals -  03/09/23 1600       PT SHORT TERM GOAL #1   Title Pt will be independent with his initial HEP to decrease  pain, improve shoulder AROM, strength, function, and ability to reach more comfortably.    Time 3    Period Weeks    Status New    Target Date 03/31/23              PT Long Term Goals - 03/09/23 1602       PT LONG TERM GOAL #1   Title Pt will have a decrease in R shoulder pain to 4/10 or less at worst to promote ability to reach up, reach behind, as well as don and doff clothes more comfortably.    Baseline 8-9/10 R shoulder joint pain at worst for the past month based on pt interview (03/09/2023)    Time 8    Period Weeks    Status New    Target Date 05/05/23      PT LONG TERM GOAL #2   Title Pt will have a decrease in R lateral neck pain to 3/10 or less at worst to promote ability to reach, look around more comfortably.    Baseline 7-9/10 R lateral neck pain at worst for the past month (feels like throbbing)  based on patient interview (03/09/2023)    Time 8    Period Weeks    Status New    Target Date 05/05/23      PT LONG TERM GOAL #3   Title Pt will improve his R shoulder FOTO score by at least 10 points as a demonstration of improved function.    Baseline Unable to obtain FOTO at this time. Will try next visit (03/09/2023)    Time 8    Period Weeks    Status New    Target Date 05/05/23      PT LONG TERM GOAL #4   Title Pt will improve R shoulder flexion and abduction AROM by at least 20 degrees to promote ability to reach up more comfortably.    Baseline R shoulder AROM: 82 degrees flexion with pain, 62 degrees abduction with pain (03/09/2023)    Time 8    Period Weeks    Status New    Target Date 05/05/23      PT LONG TERM GOAL #5   Title Pt will improve R shoulder functional IR to R thumb to T12 spinous process to promote ability to don and doff clothes more comfortably.    Baseline R shoulder functional IR: R thumb to sacrum (03/09/2023)    Time 8    Period  Weeks    Status New    Target Date 05/05/23              Plan - 03/22/23 0729     Clinical Impression Statement Focused more on gentle strengthening of scapular, posterior shoulder and ER muscles to decrease anterior shoulder and upper trap muscle tension. Decreased R shoulder tightness reported after session. Pt will benefit from continued skilled physical therapy services to decrease pain, improve strength and function.    Personal Factors and Comorbidities Age;Fitness;Time since onset of injury/illness/exacerbation;Comorbidity 3+    Comorbidities Arthritis, cardiomyopathy, chronic back pain, CHF, HTN, obesity    Examination-Activity Limitations Dressing;Bathing;Transfers;Bed Mobility;Hygiene/Grooming;Sleep;Lift;Caring for Others;Carry;Reach Overhead;Toileting    Stability/Clinical Decision Making Stable/Uncomplicated    Rehab Potential Fair    PT Frequency 2x / week    PT Duration 8 weeks    PT Treatment/Interventions Therapeutic activities;Therapeutic exercise;Neuromuscular re-education;Patient/family education;Manual techniques;Dry needling;Electrical Stimulation;Iontophoresis 4mg /ml Dexamethasone    PT Next Visit Plan posture, scapular and ER strengthening, improve glenohumeral mechanics, manual  techniques, modalities PRN    Consulted and Agree with Plan of Care Patient               Loralyn Freshwater PT, DPT   03/22/2023, 8:14 AM

## 2023-03-24 ENCOUNTER — Ambulatory Visit (INDEPENDENT_AMBULATORY_CARE_PROVIDER_SITE_OTHER): Payer: Medicare HMO | Admitting: Cardiovascular Disease

## 2023-03-24 ENCOUNTER — Encounter: Payer: Self-pay | Admitting: Cardiovascular Disease

## 2023-03-24 VITALS — BP 106/74 | HR 107 | Ht 73.5 in | Wt 307.0 lb

## 2023-03-24 DIAGNOSIS — I739 Peripheral vascular disease, unspecified: Secondary | ICD-10-CM | POA: Diagnosis not present

## 2023-03-24 DIAGNOSIS — Z6832 Body mass index (BMI) 32.0-32.9, adult: Secondary | ICD-10-CM

## 2023-03-24 DIAGNOSIS — E6609 Other obesity due to excess calories: Secondary | ICD-10-CM

## 2023-03-24 DIAGNOSIS — I5043 Acute on chronic combined systolic (congestive) and diastolic (congestive) heart failure: Secondary | ICD-10-CM

## 2023-03-24 DIAGNOSIS — I5023 Acute on chronic systolic (congestive) heart failure: Secondary | ICD-10-CM

## 2023-03-24 DIAGNOSIS — I10 Essential (primary) hypertension: Secondary | ICD-10-CM

## 2023-03-24 MED ORDER — DIGOXIN 125 MCG PO TABS: 125.0000 ug | ORAL_TABLET | Freq: Every day | ORAL | 11 refills | Status: DC

## 2023-03-24 MED ORDER — METOPROLOL SUCCINATE 12.5 MG HALF TABLET: 200.0000 mg | ORAL_TABLET | Freq: Every day | ORAL | Status: DC

## 2023-03-24 NOTE — Assessment & Plan Note (Signed)
Heart rate still 107, will change metoprolol  succinateto 200 mg and add digoxin

## 2023-03-24 NOTE — Progress Notes (Signed)
Cardiology Office Note   Date:  03/24/2023   ID:  Ronald Horne, DOB 29-Sep-1951, MRN [id number]  PCP:  Sherrie Mustache, MD  Cardiologist:  Adrian Blackwater, MD      History of Present Illness: Ronald Horne is a 72 y.o. male who presents for  Chief Complaint  Patient presents with   Follow-up    1 month follow up    Has right shoulder pains      Past Medical History:  Diagnosis Date   Arthritis    Cardiomyopathy (HCC)    a. 08/2016 Echo: EF 30-35%, diff HK; b. 03/2018 Ex MV ([facility]): Ex time 2:00, large anter defect, EF 24%; c. 03/2018 Echo: EF 20-25%, diff HK. Gr1 DD; d. Refused cath.   Chronic back pain    Chronic combined systolic (congestive) and diastolic (congestive) heart failure (HCC)    a. 08/2016 Echo: EF 30-35%, diff HK, mild to mod MR, mild BAE; b. 03/2018 Echo: EF 20-25%, diff HK. Gr1 DD. MIldly dil Ao root. Mild BAE. Mildly reduced RV fxn.   GERD (gastroesophageal reflux disease)    Gout    Hypertension    Obesity      Past Surgical History:  Procedure Laterality Date   EPIDURAL BLOCK INJECTION     chronic back pain    LEFT HEART CATH AND CORONARY ANGIOGRAPHY Left 12/27/2019   Procedure: LEFT HEART CATH AND CORONARY ANGIOGRAPHY;  Surgeon: Laurier Nancy, MD;  Location: [facility] INVASIVE CV LAB;  Service: Cardiovascular;  Laterality: Left;   TONSILLECTOMY       Current Outpatient Medications  Medication Sig Dispense Refill   digoxin (LANOXIN) 0.125 MG tablet Take 1 tablet (125 mcg total) by mouth daily. 30 tablet 11   ascorbic acid (VITAMIN C) 500 MG tablet Take 500 mg by mouth daily.     aspirin 81 MG tablet Take 1 tablet (81 mg total) by mouth daily. (Patient not taking: Reported on 10/06/2022) 30 tablet 2   atorvastatin (LIPITOR) 10 MG tablet Take 1 tablet (10 mg total) by mouth daily. 30 tablet 2   CORLANOR 7.5 MG TABS tablet Take 7.5 mg by mouth 2 (two) times daily.     ENTRESTO 97-103 MG Take 1 tablet by mouth 2 (two) times daily.     ergocalciferol  (VITAMIN D2) 1.25 MG (50000 UT) capsule Take 50,000 Units by mouth once a week.     FARXIGA 10 MG TABS tablet Take 10 mg by mouth daily.     furosemide (LASIX) 40 MG tablet Take 1 tablet (40 mg total) by mouth daily. 90 tablet 3   methimazole (TAPAZOLE) 5 MG tablet Take 7.5 mg by mouth daily.     spironolactone (ALDACTONE) 25 MG tablet Take 12.5 mg by mouth daily.     Current Facility-Administered Medications  Medication Dose Route Frequency Provider Last Rate Last Admin   metoprolol succinate (TOPROL-XL) 24 hr tablet 200 mg  200 mg Oral Daily Laurier Nancy, MD       Facility-Administered Medications Ordered in Other Visits  Medication Dose Route Frequency Provider Last Rate Last Admin   sodium chloride flush (NS) 0.9 % injection 3 mL  3 mL Intravenous Q12H Laurier Nancy, MD        Allergies:   Patient has no known allergies.    Social History:   reports that he quit smoking about 46 years ago. His smoking use included cigarettes. He has a 3.00 pack-year smoking history. He has never used  smokeless tobacco. He reports that he does not drink alcohol and does not use drugs.   Family History:  family history is not on file.    ROS:     Review of Systems  Constitutional: Negative.   HENT: Negative.    Eyes: Negative.   Respiratory: Negative.    Gastrointestinal: Negative.   Genitourinary: Negative.   Musculoskeletal: Negative.   Skin: Negative.   Neurological: Negative.   Endo/Heme/Allergies: Negative.   Psychiatric/Behavioral: Negative.    All other systems reviewed and are negative.     All other systems are reviewed and negative.    PHYSICAL EXAM: VS:  BP 106/74   Pulse (!) 107   Ht 6' 1.5" (1.867 m)   Wt (!) 307 lb (139.3 kg)   SpO2 95%   BMI 39.95 kg/m  , BMI Body mass index is 39.95 kg/m. Last weight:  Wt Readings from Last 3 Encounters:  03/24/23 (!) 307 lb (139.3 kg)  02/20/23 (!) 305 lb (138.3 kg)  01/20/23 (!) 316 lb 6.4 oz (143.5 kg)     Physical  Exam Vitals reviewed.  Constitutional:      Appearance: Normal appearance. He is normal weight.  HENT:     Head: Normocephalic.     Nose: Nose normal.     Mouth/Throat:     Mouth: Mucous membranes are moist.  Eyes:     Pupils: Pupils are equal, round, and reactive to light.  Cardiovascular:     Rate and Rhythm: Normal rate and regular rhythm.     Pulses: Normal pulses.     Heart sounds: Normal heart sounds.  Pulmonary:     Effort: Pulmonary effort is normal.  Abdominal:     General: Abdomen is flat. Bowel sounds are normal.  Musculoskeletal:        General: Normal range of motion.     Cervical back: Normal range of motion.  Skin:    General: Skin is warm.  Neurological:     General: No focal deficit present.     Mental Status: He is alert.  Psychiatric:        Mood and Affect: Mood normal.       EKG:   Recent Labs: 07/30/2022: ALT 67; B Natriuretic Peptide 591.4; TSH 0.376 08/01/2022: BUN 24; Creatinine, Ser 1.10; Hemoglobin 14.6; Magnesium 2.1; Platelets 187; Potassium 3.8; Sodium 140    Lipid Panel No results found for: "CHOL", "TRIG", "HDL", "CHOLHDL", "VLDL", "LDLCALC", "LDLDIRECT"    Other studies Reviewed: Additional studies/ records that were reviewed today include:  Review of the above records demonstrates:      04/12/2018   11:13 AM  PAD Screen  Previous PAD dx? No  Previous surgical procedure? No  Pain with walking? No  Feet/toe relief with dangling? No  Painful, non-healing ulcers? No  Extremities discolored? No      ASSESSMENT AND PLAN:    ICD-10-CM   1. Acute on chronic systolic CHF (congestive heart failure) (HCC)  I50.23 metoprolol succinate (TOPROL-XL) 24 hr tablet 200 mg    2. Primary hypertension  I10 metoprolol succinate (TOPROL-XL) 24 hr tablet 200 mg    3. PAD (peripheral artery disease) (HCC)  I73.9 metoprolol succinate (TOPROL-XL) 24 hr tablet 200 mg    4. Class 1 obesity due to excess calories with body mass index (BMI) of  32.0 to 32.9 in adult, unspecified whether serious comorbidity present  E66.09 metoprolol succinate (TOPROL-XL) 24 hr tablet 200 mg   Z68.32  5. Acute on chronic combined systolic and diastolic congestive heart failure (HCC)  I50.43        Problem List Items Addressed This Visit       Cardiovascular and Mediastinum   CHF exacerbation (HCC)    Heart rate still 107, will change metoprolol  succinateto 200 mg and add digoxin       Relevant Medications   digoxin (LANOXIN) 0.125 MG tablet   metoprolol succinate (TOPROL-XL) 24 hr tablet 200 mg (Start on 03/24/2023 10:00 AM)   Acute on chronic systolic CHF (congestive heart failure) (HCC) - Primary   Relevant Medications   digoxin (LANOXIN) 0.125 MG tablet   metoprolol succinate (TOPROL-XL) 24 hr tablet 200 mg (Start on 03/24/2023 10:00 AM)   Hypertension   Relevant Medications   digoxin (LANOXIN) 0.125 MG tablet   metoprolol succinate (TOPROL-XL) 24 hr tablet 200 mg (Start on 03/24/2023 10:00 AM)   PAD (peripheral artery disease) (HCC)   Relevant Medications   digoxin (LANOXIN) 0.125 MG tablet   metoprolol succinate (TOPROL-XL) 24 hr tablet 200 mg (Start on 03/24/2023 10:00 AM)     Other   Obesity   Relevant Medications   metoprolol succinate (TOPROL-XL) 24 hr tablet 200 mg (Start on 03/24/2023 10:00 AM)       Disposition:   Return in about 4 weeks (around 04/21/2023).    Total time spent: 30 minutes  Signed,  Adrian Blackwater, MD  03/24/2023 9:34 AM    [facility]

## 2023-03-29 ENCOUNTER — Ambulatory Visit: Payer: Medicare HMO

## 2023-03-29 DIAGNOSIS — M25511 Pain in right shoulder: Secondary | ICD-10-CM | POA: Diagnosis not present

## 2023-03-29 DIAGNOSIS — M542 Cervicalgia: Secondary | ICD-10-CM

## 2023-03-29 NOTE — Therapy (Signed)
OUTPATIENT PHYSICAL THERAPY TREATMENT NOTE   Patient Name: Ronald Horne MRN: [id number] DOB:September 26, 1951, 72 y.o., male Today's Date: 03/29/2023  PCP: Sherrie Mustache, MD  REFERRING PROVIDER: Jerrel Ivory, DO   END OF SESSION:  PT End of Session - 03/29/23 0819     Visit Number 6    Number of Visits 17    Date for PT Re-Evaluation 05/05/23    PT Start Time 0819    PT Stop Time 0859    PT Time Calculation (min) 40 min    Activity Tolerance Patient tolerated treatment well    Behavior During Therapy [facility] for tasks assessed/performed                 Past Medical History:  Diagnosis Date   Arthritis    Cardiomyopathy (HCC)    a. 08/2016 Echo: EF 30-35%, diff HK; b. 03/2018 Ex MV Dario Guardian): Ex time 2:00, large anter defect, EF 24%; c. 03/2018 Echo: EF 20-25%, diff HK. Gr1 DD; d. Refused cath.   Chronic back pain    Chronic combined systolic (congestive) and diastolic (congestive) heart failure (HCC)    a. 08/2016 Echo: EF 30-35%, diff HK, mild to mod MR, mild BAE; b. 03/2018 Echo: EF 20-25%, diff HK. Gr1 DD. MIldly dil Ao root. Mild BAE. Mildly reduced RV fxn.   GERD (gastroesophageal reflux disease)    Gout    Hypertension    Obesity    Past Surgical History:  Procedure Laterality Date   EPIDURAL BLOCK INJECTION     chronic back pain    LEFT HEART CATH AND CORONARY ANGIOGRAPHY Left 12/27/2019   Procedure: LEFT HEART CATH AND CORONARY ANGIOGRAPHY;  Surgeon: Laurier Nancy, MD;  Location: [facility] INVASIVE CV LAB;  Service: Cardiovascular;  Laterality: Left;   TONSILLECTOMY     Patient Active Problem List   Diagnosis Date Noted   PAD (peripheral artery disease) (HCC) 09/02/2022   Acute respiratory failure with hypoxia (HCC) 07/31/2022   CHF exacerbation (HCC) 07/30/2022   Acute on chronic systolic CHF (congestive heart failure) (HCC) 07/30/2022   Hypokalemia 07/30/2022   Hyperglycemia 07/30/2022   Elevated troponin 07/30/2022   Obesity    Hypertension    GERD  (gastroesophageal reflux disease)    Hyperthyroidism    Acute coronary syndrome (HCC) 11/28/2019    REFERRING DIAG: M25.511 (ICD-10-CM) - Pain in right shoulder M75.41 (ICD-10-CM) - Impingement syndrome of right shoulder M19.011 (ICD-10-CM) - Primary osteoarthritis, right shoulder M77.8 (ICD-10-CM) - Other enthesopathies, not elsewhere classified   THERAPY DIAG:  Right shoulder pain, unspecified chronicity  Cervicalgia  Rationale for Evaluation and Treatment Rehabilitation  PERTINENT HISTORY: R shoulder pain. MD wants pt to try PT first before getting the shots. R shoulder pain has been going on for about a month or so. Might be due to his bed. Pt was also walks his dog and his dog tugs on his shoulder which might have bothered it. Pain might have also been lifting water from his car. Pain is off and on, today it might be 9/10, tomorrow might be 5/10. Pt is R hand dominant. 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 Sees a heart specialist regularly, about every 3 weeks now. Main area of pain is anterior and lateral shoulder joint. Also has pain in the R lateral neck area.   PRECAUTIONS: Hx of Cardiomyopathy, CHF   SUBJECTIVE:   SUBJECTIVE STATEMENT: R shoulder has a little pain  in the joint and R lateral neck stiffness. 6/10 R lateral shoulder when raising his arm up.       PAIN:  Are you having pain? See subjective   TODAY'S TREATMENT:                                                                                                                                         DATE: 03/29/2023  Precautions: hx of CHF   Decreased R shoulder pain with flexion with scapular retraction    (+) empty can and Hawkins kennedy R shoulder   Demonstrates scapular compensation    No latex allergies    Therapeutic exercise   Blood pressure, L arm sitting, mechanically taken, normal cuff: 118/76, HR 93  Seated R upper trap  stretch  Cues to not shrug both shoulder blades  1 minute   Seated B scapular retraction 10x3 with 5 second holds  Seated scapular retraction with   R shoulder flexion with PT assist 10x   Seated R shoulder and triceps extension isometrics, hand on table 10x3 with 5 second holds  Good posterior inferior glide of humeral head palpated.   Seated R shoulder IR isometrics hand on abdomen 5x5 seconds  Seated R shoulder IR with PT manual resistance 10x3  Seated R shoulder ER yellow band  10x2, then 6x    Therapeutic rest breaks provided secondary to fatigue    Improved exercise technique, movement at target joints, use of target muscles after mod verbal, visual, tactile cues.        Response to treatment Decreased R shoulder tightness reported after session.          Clinical impression Continued with scapular mechanics, posterior shoulder and rotator cuff strengthening, as well as decreasing R upper trap muscle tension to decrease stress to his neck and promote better glenohumeral movement. Decreased R shoulder tightness reported after session. Pt will benefit from continued skilled physical therapy services to decrease pain, improve strength and function.      PATIENT EDUCATION: Education details: there-ex, HEP Person educated: Patient Education method: Explanation, Demonstration, Tactile cues, Verbal cues, and Handouts Education comprehension: verbalized understanding and returned demonstration  HOME EXERCISE PROGRAM: Access Code: [email or web address] URL: [email or web address] Date: 03/14/2023 Prepared by: Loralyn Freshwater  Exercises - Standing Shoulder External Rotation with Resistance  - 1 x daily - 7 x weekly - 3 sets - 10 reps  - Seated Upper Trapezius Stretch  - 3 x daily - 7 x weekly - 1 sets - 5 reps - 30 seconds  hold - Seated Scapular Retraction  - 1 x daily - 7 x weekly - 3 sets - 10 reps - 5 seconds hold - Isometric Tricep Extension   - 1 x daily - 7  x weekly - 3 sets - 10 reps - 5 seconds hold      PT Short Term Goals - 03/09/23 1600  PT SHORT TERM GOAL #1   Title Pt will be independent with his initial HEP to decrease pain, improve shoulder AROM, strength, function, and ability to reach more comfortably.    Time 3    Period Weeks    Status New    Target Date 03/31/23              PT Long Term Goals - 03/09/23 1602       PT LONG TERM GOAL #1   Title Pt will have a decrease in R shoulder pain to 4/10 or less at worst to promote ability to reach up, reach behind, as well as don and doff clothes more comfortably.    Baseline 8-9/10 R shoulder joint pain at worst for the past month based on pt interview (03/09/2023)    Time 8    Period Weeks    Status New    Target Date 05/05/23      PT LONG TERM GOAL #2   Title Pt will have a decrease in R lateral neck pain to 3/10 or less at worst to promote ability to reach, look around more comfortably.    Baseline 7-9/10 R lateral neck pain at worst for the past month (feels like throbbing)  based on patient interview (03/09/2023)    Time 8    Period Weeks    Status New    Target Date 05/05/23      PT LONG TERM GOAL #3   Title Pt will improve his R shoulder FOTO score by at least 10 points as a demonstration of improved function.    Baseline Unable to obtain FOTO at this time. Will try next visit (03/09/2023)    Time 8    Period Weeks    Status New    Target Date 05/05/23      PT LONG TERM GOAL #4   Title Pt will improve R shoulder flexion and abduction AROM by at least 20 degrees to promote ability to reach up more comfortably.    Baseline R shoulder AROM: 82 degrees flexion with pain, 62 degrees abduction with pain (03/09/2023)    Time 8    Period Weeks    Status New    Target Date 05/05/23      PT LONG TERM GOAL #5   Title Pt will improve R shoulder functional IR to R thumb to T12 spinous process to promote ability to don and doff clothes more comfortably.    Baseline R  shoulder functional IR: R thumb to sacrum (03/09/2023)    Time 8    Period Weeks    Status New    Target Date 05/05/23              Plan - 03/29/23 0818     Clinical Impression Statement Continued with scapular mechanics, posterior shoulder and rotator cuff strengthening, as well as decreasing R upper trap muscle tension to decrease stress to his neck and promote better glenohumeral movement. Decreased R shoulder tightness reported after session. Pt will benefit from continued skilled physical therapy services to decrease pain, improve strength and function.    Personal Factors and Comorbidities Age;Fitness;Time since onset of injury/illness/exacerbation;Comorbidity 3+    Comorbidities Arthritis, cardiomyopathy, chronic back pain, CHF, HTN, obesity    Examination-Activity Limitations Dressing;Bathing;Transfers;Bed Mobility;Hygiene/Grooming;Sleep;Lift;Caring for Others;Carry;Reach Overhead;Toileting    Stability/Clinical Decision Making Stable/Uncomplicated    Rehab Potential Fair    PT Frequency 2x / week    PT Duration 8 weeks    PT  Treatment/Interventions Therapeutic activities;Therapeutic exercise;Neuromuscular re-education;Patient/family education;Manual techniques;Dry needling;Electrical Stimulation;Iontophoresis 4mg /ml Dexamethasone    PT Next Visit Plan posture, scapular and ER strengthening, improve glenohumeral mechanics, manual techniques, modalities PRN    PT Home Exercise Plan Medbridge [email or web address]    Consulted and Agree with Plan of Care Patient               Loralyn Freshwater PT, DPT   03/29/2023, 9:27 AM

## 2023-04-03 ENCOUNTER — Telehealth: Payer: Self-pay | Admitting: Family

## 2023-04-03 ENCOUNTER — Ambulatory Visit: Payer: Medicare HMO | Attending: Sports Medicine

## 2023-04-03 ENCOUNTER — Encounter: Payer: Self-pay | Admitting: Family

## 2023-04-03 ENCOUNTER — Ambulatory Visit (HOSPITAL_BASED_OUTPATIENT_CLINIC_OR_DEPARTMENT_OTHER): Payer: Medicare HMO | Admitting: Family

## 2023-04-03 VITALS — BP 102/84 | HR 89 | Ht 73.5 in | Wt 308.0 lb

## 2023-04-03 DIAGNOSIS — M6281 Muscle weakness (generalized): Secondary | ICD-10-CM | POA: Diagnosis present

## 2023-04-03 DIAGNOSIS — M25512 Pain in left shoulder: Secondary | ICD-10-CM | POA: Insufficient documentation

## 2023-04-03 DIAGNOSIS — I739 Peripheral vascular disease, unspecified: Secondary | ICD-10-CM | POA: Insufficient documentation

## 2023-04-03 DIAGNOSIS — I110 Hypertensive heart disease with heart failure: Secondary | ICD-10-CM | POA: Insufficient documentation

## 2023-04-03 DIAGNOSIS — E785 Hyperlipidemia, unspecified: Secondary | ICD-10-CM | POA: Insufficient documentation

## 2023-04-03 DIAGNOSIS — G4730 Sleep apnea, unspecified: Secondary | ICD-10-CM | POA: Insufficient documentation

## 2023-04-03 DIAGNOSIS — I5022 Chronic systolic (congestive) heart failure: Secondary | ICD-10-CM | POA: Insufficient documentation

## 2023-04-03 DIAGNOSIS — F419 Anxiety disorder, unspecified: Secondary | ICD-10-CM | POA: Insufficient documentation

## 2023-04-03 DIAGNOSIS — G4733 Obstructive sleep apnea (adult) (pediatric): Secondary | ICD-10-CM

## 2023-04-03 DIAGNOSIS — I10 Essential (primary) hypertension: Secondary | ICD-10-CM | POA: Diagnosis present

## 2023-04-03 DIAGNOSIS — M109 Gout, unspecified: Secondary | ICD-10-CM | POA: Insufficient documentation

## 2023-04-03 DIAGNOSIS — K219 Gastro-esophageal reflux disease without esophagitis: Secondary | ICD-10-CM | POA: Insufficient documentation

## 2023-04-03 DIAGNOSIS — M25511 Pain in right shoulder: Secondary | ICD-10-CM | POA: Insufficient documentation

## 2023-04-03 DIAGNOSIS — M542 Cervicalgia: Secondary | ICD-10-CM | POA: Insufficient documentation

## 2023-04-03 DIAGNOSIS — Z87891 Personal history of nicotine dependence: Secondary | ICD-10-CM | POA: Insufficient documentation

## 2023-04-03 DIAGNOSIS — Z79899 Other long term (current) drug therapy: Secondary | ICD-10-CM | POA: Insufficient documentation

## 2023-04-03 NOTE — Progress Notes (Signed)
Height:  6'1"   Weight:308 lb BMI:40  Today's Date:04/03/23  STOP BANG RISK ASSESSMENT S (snore) Have you been told that you snore?     YES   T (tired) Are you often tired, fatigued, or sleepy during the day?   YES  O (obstruction) Do you stop breathing, choke, or gasp during sleep? NO   P (pressure) Do you have or are you being treated for high blood pressure? YES   B (BMI) Is your body index greater than 35 kg/m? YES   A (age) Are you 70 years old or older? YES   N (neck) Do you have a neck circumference greater than 16 inches?   NO   G (gender) Are you a male? YES   TOTAL STOP/BANG "YES" ANSWERS 6                                                                       For Office Use Only              Procedure Order Form    YES to 3+ Stop Bang questions OR two clinical symptoms - patient qualifies for WatchPAT (CPT 95800)      Clinical Notes: Will consult Sleep Specialist and refer for management of therapy due to patient increased risk of Sleep Apnea. Ordering a sleep study due to the following two clinical symptoms: Excessive daytime sleepiness G47.10 /  /  / Loud snoring R06.83  / Unrefreshed by sleep G47.8 /  / History of high blood pressure R03.0 / Insomnia G47.00

## 2023-04-03 NOTE — Telephone Encounter (Signed)
Lab results from PCP office dated 01/04/23:  Sodium 137 Potassium 4.7 Creatinine 1.0 eGFR 81 Hemoglobin 15.6 Cholesterol 162 LDL 98 Triglycerides 85, A1c 6.0%

## 2023-04-03 NOTE — Therapy (Signed)
OUTPATIENT PHYSICAL THERAPY TREATMENT NOTE   Patient Name: Ronald Horne MRN: [id number] DOB:01/21/1951, 72 y.o., male Today's Date: 04/03/2023  PCP: Sherrie Mustache, MD  REFERRING PROVIDER: Jerrel Ivory, DO   END OF SESSION:  PT End of Session - 04/03/23 0732     Visit Number 7    Number of Visits 17    Date for PT Re-Evaluation 05/05/23    PT Start Time 0732    PT Stop Time 0755    PT Time Calculation (min) 23 min    Activity Tolerance Patient tolerated treatment well    Behavior During Therapy [facility] for tasks assessed/performed                  Past Medical History:  Diagnosis Date   Arthritis    Cardiomyopathy (HCC)    a. 08/2016 Echo: EF 30-35%, diff HK; b. 03/2018 Ex MV (Jadali): Ex time 2:00, large anter defect, EF 24%; c. 03/2018 Echo: EF 20-25%, diff HK. Gr1 DD; d. Refused cath.   Chronic back pain    Chronic combined systolic (congestive) and diastolic (congestive) heart failure (HCC)    a. 08/2016 Echo: EF 30-35%, diff HK, mild to mod MR, mild BAE; b. 03/2018 Echo: EF 20-25%, diff HK. Gr1 DD. MIldly dil Ao root. Mild BAE. Mildly reduced RV fxn.   GERD (gastroesophageal reflux disease)    Gout    Hypertension    Obesity    Past Surgical History:  Procedure Laterality Date   EPIDURAL BLOCK INJECTION     chronic back pain    LEFT HEART CATH AND CORONARY ANGIOGRAPHY Left 12/27/2019   Procedure: LEFT HEART CATH AND CORONARY ANGIOGRAPHY;  Surgeon: Laurier Nancy, MD;  Location: [facility] INVASIVE CV LAB;  Service: Cardiovascular;  Laterality: Left;   TONSILLECTOMY     Patient Active Problem List   Diagnosis Date Noted   PAD (peripheral artery disease) (HCC) 09/02/2022   Acute respiratory failure with hypoxia (HCC) 07/31/2022   CHF exacerbation (HCC) 07/30/2022   Acute on chronic systolic CHF (congestive heart failure) (HCC) 07/30/2022   Hypokalemia 07/30/2022   Hyperglycemia 07/30/2022   Elevated troponin 07/30/2022   Obesity    Hypertension    GERD  (gastroesophageal reflux disease)    Hyperthyroidism    Acute coronary syndrome (HCC) 11/28/2019    REFERRING DIAG: M25.511 (ICD-10-CM) - Pain in right shoulder M75.41 (ICD-10-CM) - Impingement syndrome of right shoulder M19.011 (ICD-10-CM) - Primary osteoarthritis, right shoulder M77.8 (ICD-10-CM) - Other enthesopathies, not elsewhere classified   THERAPY DIAG:  Right shoulder pain, unspecified chronicity  Cervicalgia  Rationale for Evaluation and Treatment Rehabilitation  PERTINENT HISTORY: R shoulder pain. MD wants pt to try PT first before getting the shots. R shoulder pain has been going on for about a month or so. Might be due to his bed. Pt was also walks his dog and his dog tugs on his shoulder which might have bothered it. Pain might have also been lifting water from his car. Pain is off and on, today it might be 9/10, tomorrow might be 5/10. Pt is R hand dominant. 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 Sees a heart specialist regularly, about every 3 weeks now. Main area of pain is anterior and lateral shoulder joint. Also has pain in the R lateral neck area.   PRECAUTIONS: Hx of Cardiomyopathy, CHF   SUBJECTIVE:   SUBJECTIVE STATEMENT: R shoulder is about a  5-6/10 currently. Has to leave in 20 min for another appointment (taking someone to another appointment).       PAIN:  Are you having pain? See subjective   TODAY'S TREATMENT:                                                                                                                                         DATE: 04/03/2023  Precautions: hx of CHF   Decreased R shoulder pain with flexion with scapular retraction    (+) empty can and Hawkins kennedy R shoulder   Demonstrates scapular compensation    No latex allergies    Therapeutic exercise   Blood pressure, L arm sitting, mechanically taken, normal cuff: 120/76, HR 97  Seated manually  resisted R triceps extension isometrics 10x5 seconds for 2 sets  Seated R shoulder IR red band 10x2  Seated R shoulder ER yellow band 10x2  Seated B scapular retraction with chin tuck 5x 5 seconds for 3 sets  Seated R upper trap stretch  1 minute   Seated scapular retraction with   R shoulder flexion with PT assist 10x    Improved exercise technique, movement at target joints, use of target muscles after mod verbal, visual, tactile cues.        Response to treatment Pt tolerated session well without aggravation of symptoms.          Clinical impression Shorter session today secondary to pt needing to leave earlier to bring someone to an 8:15 am appointment. Continued working on decreasing R upper trap muscle tension, improving R shoulder ER, IR, and triceps strength as well as scapular muscle activation to promote better glenohumeral mechanics. Pt tolerated session well without aggravation of symptoms. Pt will benefit from continued skilled physical therapy services to decrease pain, improve strength and function.      PATIENT EDUCATION: Education details: there-ex, HEP Person educated: Patient Education method: Explanation, Demonstration, Tactile cues, Verbal cues, and Handouts Education comprehension: verbalized understanding and returned demonstration  HOME EXERCISE PROGRAM: Access Code: [email or web address] URL: [email or web address] Date: 03/14/2023 Prepared by: Loralyn Freshwater  Exercises - Standing Shoulder External Rotation with Resistance  - 1 x daily - 7 x weekly - 3 sets - 10 reps  - Seated Upper Trapezius Stretch  - 3 x daily - 7 x weekly - 1 sets - 5 reps - 30 seconds  hold - Seated Scapular Retraction  - 1 x daily - 7 x weekly - 3 sets - 10 reps - 5 seconds hold - Isometric Tricep Extension   - 1 x daily - 7 x weekly - 3 sets - 10 reps - 5 seconds hold      PT Short Term Goals - 03/09/23 1600       PT SHORT TERM GOAL #1   Title Pt will be  independent with his initial HEP to decrease pain, improve shoulder AROM,  strength, function, and ability to reach more comfortably.    Time 3    Period Weeks    Status New    Target Date 03/31/23              PT Long Term Goals - 03/09/23 1602       PT LONG TERM GOAL #1   Title Pt will have a decrease in R shoulder pain to 4/10 or less at worst to promote ability to reach up, reach behind, as well as don and doff clothes more comfortably.    Baseline 8-9/10 R shoulder joint pain at worst for the past month based on pt interview (03/09/2023)    Time 8    Period Weeks    Status New    Target Date 05/05/23      PT LONG TERM GOAL #2   Title Pt will have a decrease in R lateral neck pain to 3/10 or less at worst to promote ability to reach, look around more comfortably.    Baseline 7-9/10 R lateral neck pain at worst for the past month (feels like throbbing)  based on patient interview (03/09/2023)    Time 8    Period Weeks    Status New    Target Date 05/05/23      PT LONG TERM GOAL #3   Title Pt will improve his R shoulder FOTO score by at least 10 points as a demonstration of improved function.    Baseline Unable to obtain FOTO at this time. Will try next visit (03/09/2023)    Time 8    Period Weeks    Status New    Target Date 05/05/23      PT LONG TERM GOAL #4   Title Pt will improve R shoulder flexion and abduction AROM by at least 20 degrees to promote ability to reach up more comfortably.    Baseline R shoulder AROM: 82 degrees flexion with pain, 62 degrees abduction with pain (03/09/2023)    Time 8    Period Weeks    Status New    Target Date 05/05/23      PT LONG TERM GOAL #5   Title Pt will improve R shoulder functional IR to R thumb to T12 spinous process to promote ability to don and doff clothes more comfortably.    Baseline R shoulder functional IR: R thumb to sacrum (03/09/2023)    Time 8    Period Weeks    Status New    Target Date 05/05/23               Plan - 04/03/23 0729     Clinical Impression Statement Shorter session today secondary to pt needing to leave earlier to bring someone to an 8:15 am appointment. Continued working on decreasing R upper trap muscle tension, improving R shoulder ER, IR, and triceps strength as well as scapular muscle activation to promote better glenohumeral mechanics. Pt tolerated session well without aggravation of symptoms. Pt will benefit from continued skilled physical therapy services to decrease pain, improve strength and function.    Personal Factors and Comorbidities Age;Fitness;Time since onset of injury/illness/exacerbation;Comorbidity 3+    Comorbidities Arthritis, cardiomyopathy, chronic back pain, CHF, HTN, obesity    Examination-Activity Limitations Dressing;Bathing;Transfers;Bed Mobility;Hygiene/Grooming;Sleep;Lift;Caring for Others;Carry;Reach Overhead;Toileting    Stability/Clinical Decision Making Stable/Uncomplicated    Rehab Potential Fair    PT Frequency 2x / week    PT Duration 8 weeks    PT Treatment/Interventions Therapeutic activities;Therapeutic exercise;Neuromuscular re-education;Patient/family  education;Manual techniques;Dry needling;Electrical Stimulation;Iontophoresis 4mg /ml Dexamethasone    PT Next Visit Plan posture, scapular and ER strengthening, improve glenohumeral mechanics, manual techniques, modalities PRN    PT Home Exercise Plan Medbridge [email or web address]    Consulted and Agree with Plan of Care Patient               Loralyn Freshwater PT, DPT   04/03/2023, 8:01 AM

## 2023-04-03 NOTE — Progress Notes (Signed)
PCP: Sherrie Mustache, MD (last seen 05/24) Primary Cardiologist: Adrian Blackwater, MD (last seen 05/24)  HPI:  Ronald Horne is a 72 y/o male with a history of HTN, GERD, gout, hepatitis, hyperlipidemia, PVD/ PAD, anxiety and possible sleep apnea, previous tobacco use and chronic heart failure.   Echo 07/31/22: EF of 30-35% along with mild LVH, moderate LAE and mild Ronald.   LHC 12/27/19: Normal coronaries with mild to moderate LV dysfunction   Has not been admitted or been in the ED in the last 6 months.   He presents today for a HF follow-up visit with a chief complaint of minimal SOB with exertion. Chronic in nature. Has associated occasional palpitations, fatigue & anxiety along with this. Denies cough, chest pain, pedal edema, abdominal distention or dizziness. Has fluctuating weight at home. Continues to have a lot of stress at home as numerous family members continue to live with him.   Says that he's been told that he snores but has been unable to get a sleep study. Says initially his insurance wouldn't approve it but he's now unsure of the status of this.   Currently receiving PT on his right shoulder due to suspected subacromial impingement with possible rotator cuff tendinopathy     ROS: All systems negative except as listed in HPI, PMH and Problem List.  SH:  Social History   Socioeconomic History   Marital status: Single    Spouse name: Not on file   Number of children: Not on file   Years of education: Not on file   Highest education level: Not on file  Occupational History   Not on file  Tobacco Use   Smoking status: Former    Packs/day: 3.00    Years: 1.00    Additional pack years: 0.00    Total pack years: 3.00    Types: Cigarettes    Quit date: 12/12/1976    Years since quitting: 46.3   Smokeless tobacco: Never  Vaping Use   Vaping Use: Never used  Substance and Sexual Activity   Alcohol use: No   Drug use: No   Sexual activity: Not on file  Other Topics Concern    Not on file  Social History Narrative   Not on file   Social Determinants of Health   Financial Resource Strain: Not on file  Food Insecurity: No Food Insecurity (07/31/2022)   Hunger Vital Sign    Worried About Running Out of Food in the Last Year: Never true    Ran Out of Food in the Last Year: Never true  Transportation Needs: No Transportation Needs (07/31/2022)   PRAPARE - Administrator, Civil Service (Medical): No    Lack of Transportation (Non-Medical): No  Physical Activity: Not on file  Stress: Not on file  Social Connections: Not on file  Intimate Partner Violence: Not At Risk (07/31/2022)   Humiliation, Afraid, Rape, and Kick questionnaire    Fear of Current or Ex-Partner: No    Emotionally Abused: No    Physically Abused: No    Sexually Abused: No    FH: No family history on file.  Past Medical History:  Diagnosis Date   Arthritis    Cardiomyopathy (HCC)    a. 08/2016 Echo: EF 30-35%, diff HK; b. 03/2018 Ex MV ([facility]): Ex time 2:00, large anter defect, EF 24%; c. 03/2018 Echo: EF 20-25%, diff HK. Gr1 DD; d. Refused cath.   Chronic back pain    Chronic combined systolic (congestive)  and diastolic (congestive) heart failure (HCC)    a. 08/2016 Echo: EF 30-35%, diff HK, mild to mod Ronald, mild BAE; b. 03/2018 Echo: EF 20-25%, diff HK. Gr1 DD. MIldly dil Ao root. Mild BAE. Mildly reduced RV fxn.   GERD (gastroesophageal reflux disease)    Gout    Hypertension    Obesity     Current Outpatient Medications  Medication Sig Dispense Refill   ascorbic acid (VITAMIN C) 500 MG tablet Take 500 mg by mouth daily.     aspirin 81 MG tablet Take 1 tablet (81 mg total) by mouth daily. (Patient not taking: Reported on 10/06/2022) 30 tablet 2   atorvastatin (LIPITOR) 10 MG tablet Take 1 tablet (10 mg total) by mouth daily. 30 tablet 2   CORLANOR 7.5 MG TABS tablet Take 7.5 mg by mouth 2 (two) times daily.     digoxin (LANOXIN) 0.125 MG tablet Take 1 tablet (125 mcg  total) by mouth daily. 30 tablet 11   ENTRESTO 97-103 MG Take 1 tablet by mouth 2 (two) times daily.     ergocalciferol (VITAMIN D2) 1.25 MG (50000 UT) capsule Take 50,000 Units by mouth once a week.     FARXIGA 10 MG TABS tablet Take 10 mg by mouth daily.     furosemide (LASIX) 40 MG tablet Take 1 tablet (40 mg total) by mouth daily. 90 tablet 3   methimazole (TAPAZOLE) 5 MG tablet Take 7.5 mg by mouth daily.     spironolactone (ALDACTONE) 25 MG tablet Take 12.5 mg by mouth daily.     Current Facility-Administered Medications  Medication Dose Route Frequency Provider Last Rate Last Admin   metoprolol succinate (TOPROL-XL) 24 hr tablet 200 mg  200 mg Oral Daily Laurier Nancy, MD       Facility-Administered Medications Ordered in Other Visits  Medication Dose Route Frequency Provider Last Rate Last Admin   sodium chloride flush (NS) 0.9 % injection 3 mL  3 mL Intravenous Q12H Adrian Blackwater A, MD       Vitals:   04/03/23 1025  BP: 102/84  Pulse: 89  SpO2: 97%  Weight: (!) 308 lb (139.7 kg)  Height: 6' 1.5" (1.867 m)   Wt Readings from Last 3 Encounters:  04/03/23 (!) 308 lb (139.7 kg)  03/24/23 (!) 307 lb (139.3 kg)  02/20/23 (!) 305 lb (138.3 kg)   Lab Results  Component Value Date   CREATININE 1.10 08/01/2022   CREATININE 1.04 07/31/2022   CREATININE 1.10 07/30/2022   PHYSICAL EXAM:  General:  Well appearing. No resp difficulty HEENT: normal Neck: supple. JVP flat. No lymphadenopathy or thryomegaly appreciated. Cor: PMI normal. Regular rate & rhythm. No rubs, gallops or murmurs. Lungs: clear Abdomen: soft, nontender, nondistended. No hepatosplenomegaly. No bruits or masses.  Extremities: no cyanosis, clubbing, rash, edema Neuro: alert & oriented x 3, cranial nerves grossly intact. Moves all 4 extremities w/o difficulty. Affect pleasant. Anxious   ECG: not done   ASSESSMENT & PLAN:  1: Chronic heart failure with reduced ejection fraction- - NYHA class II -  euvolemic today - weighing daily; reminded to call for an overnight weight gain of > 2 pounds or a weekly weight gain of > 5 pounds - weight up 1 pound from last visit here 6 months ago - not adding salt and trying to eat low sodium foods - keeping daily fluid intake to 60-64 ouncess - continue entresto 97/103mg  BID - continue farxiga 10mg  daily - continue metoprolol 200mg  daily -  continue spironolactone 12.5mg  daily; current BP will not allow for titration - continue corlanor 7.5mg  BID - continue digoxin 0.125mg  daily - continue furosemide 40mg  daily - saw cardiology Welton Flakes) 05/24; returns in 1 month - BNP 07/30/22 was 591.4  2: HTN- - BP 102/84 - saw PCP Dario Guardian) about a month ago; returns later this week  - BMP 08/01/22 reviewed and showed sodium 140, potassium 3.8, creatinine 1.10 and GFR >60 - will call PCP office to request most recent lab resuls  3: Anxiety- - reports trying to limit his stress but says it's difficult because of numerous family members that share his home and he feels like he doesn't get any respect from them - voices understanding of how his mental health can certainly impact his physical heath  4: PVD/ PAD- - saw vascular (Dew) 11/23 - continue atorvastatin 10mg  daily  5: Sleep apnea- - saw pulmonology Karna Christmas) 01/24 - will get home sleep study set up  Return in 6 months, sooner if needed.

## 2023-04-04 NOTE — Addendum Note (Signed)
Addended by: Electa Sniff on: 04/04/2023 02:15 PM   Modules accepted: Orders

## 2023-04-04 NOTE — Progress Notes (Signed)
ITAMAR home sleep study given to patient, all instructions explained, waiver signed, and CLOUDPAT registration complete. On day of visit.

## 2023-04-05 ENCOUNTER — Encounter: Payer: Medicare HMO | Admitting: Family

## 2023-04-06 ENCOUNTER — Ambulatory Visit: Payer: Medicare HMO

## 2023-04-06 DIAGNOSIS — M542 Cervicalgia: Secondary | ICD-10-CM

## 2023-04-06 DIAGNOSIS — M25511 Pain in right shoulder: Secondary | ICD-10-CM

## 2023-04-06 NOTE — Therapy (Signed)
OUTPATIENT PHYSICAL THERAPY TREATMENT NOTE   Patient Name: Ronald Horne MRN: [id number] DOB:1951/08/16, 72 y.o., male Today's Date: 04/06/2023  PCP: Sherrie Mustache, MD  REFERRING PROVIDER: Jerrel Ivory, DO   END OF SESSION:  PT End of Session - 04/06/23 1348     Visit Number 7    Number of Visits 17    Date for PT Re-Evaluation 05/05/23    PT Start Time 1348    PT Stop Time 1357    PT Time Calculation (min) 9 min    Activity Tolerance Patient tolerated treatment well    Behavior During Therapy [facility] for tasks assessed/performed                   Past Medical History:  Diagnosis Date   Arthritis    Cardiomyopathy (HCC)    a. 08/2016 Echo: EF 30-35%, diff HK; b. 03/2018 Ex MV (Jadali): Ex time 2:00, large anter defect, EF 24%; c. 03/2018 Echo: EF 20-25%, diff HK. Gr1 DD; d. Refused cath.   Chronic back pain    Chronic combined systolic (congestive) and diastolic (congestive) heart failure (HCC)    a. 08/2016 Echo: EF 30-35%, diff HK, mild to mod MR, mild BAE; b. 03/2018 Echo: EF 20-25%, diff HK. Gr1 DD. MIldly dil Ao root. Mild BAE. Mildly reduced RV fxn.   GERD (gastroesophageal reflux disease)    Gout    Hypertension    Obesity    Past Surgical History:  Procedure Laterality Date   EPIDURAL BLOCK INJECTION     chronic back pain    LEFT HEART CATH AND CORONARY ANGIOGRAPHY Left 12/27/2019   Procedure: LEFT HEART CATH AND CORONARY ANGIOGRAPHY;  Surgeon: Laurier Nancy, MD;  Location: [facility] INVASIVE CV LAB;  Service: Cardiovascular;  Laterality: Left;   TONSILLECTOMY     Patient Active Problem List   Diagnosis Date Noted   PAD (peripheral artery disease) (HCC) 09/02/2022   Acute respiratory failure with hypoxia (HCC) 07/31/2022   CHF exacerbation (HCC) 07/30/2022   Acute on chronic systolic CHF (congestive heart failure) (HCC) 07/30/2022   Hypokalemia 07/30/2022   Hyperglycemia 07/30/2022   Elevated troponin 07/30/2022   Obesity    Hypertension    GERD  (gastroesophageal reflux disease)    Hyperthyroidism    Acute coronary syndrome (HCC) 11/28/2019    REFERRING DIAG: M25.511 (ICD-10-CM) - Pain in right shoulder M75.41 (ICD-10-CM) - Impingement syndrome of right shoulder M19.011 (ICD-10-CM) - Primary osteoarthritis, right shoulder M77.8 (ICD-10-CM) - Other enthesopathies, not elsewhere classified   THERAPY DIAG:  Right shoulder pain, unspecified chronicity  Cervicalgia  Rationale for Evaluation and Treatment Rehabilitation  PERTINENT HISTORY: R shoulder pain. MD wants pt to try PT first before getting the shots. R shoulder pain has been going on for about a month or so. Might be due to his bed. Pt was also walks his dog and his dog tugs on his shoulder which might have bothered it. Pain might have also been lifting water from his car. Pain is off and on, today it might be 9/10, tomorrow might be 5/10. Pt is R hand dominant. 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 Sees a heart specialist regularly, about every 3 weeks now. Main area of pain is anterior and lateral shoulder joint. Also has pain in the R lateral neck area.   PRECAUTIONS: Hx of Cardiomyopathy, CHF   SUBJECTIVE:   SUBJECTIVE STATEMENT: Just want blood pressure  to be taken. Has to leave and take someone to another appointment by 2 pm. L shoulder is trying to act up now. R shoulder is alright. 5-6/10 currently. The stretches and the shoulder blade strengthening exercises help. Doing his exercises at home       PAIN:  Are you having pain? See subjective   TODAY'S TREATMENT:                                                                                                                                         DATE: 04/06/2023  Precautions: hx of CHF   Decreased R shoulder pain with flexion with scapular retraction    (+) empty can and Hawkins kennedy R shoulder   Demonstrates scapular compensation    No latex  allergies    Blood pressure, L arm sitting, mechanically taken, normal cuff: 99/65, HR 100            Clinical impression Pt only wanted his blood pressure taken secondary to having to leave and take someone to her 2 pm appointment. No physical therapy was performed.      PATIENT EDUCATION: Education details: there-ex, HEP Person educated: Patient Education method: Explanation, Demonstration, Tactile cues, Verbal cues, and Handouts Education comprehension: verbalized understanding and returned demonstration  HOME EXERCISE PROGRAM: Access Code: [email or web address] URL: [email or web address] Date: 03/14/2023 Prepared by: Loralyn Freshwater  Exercises - Standing Shoulder External Rotation with Resistance  - 1 x daily - 7 x weekly - 3 sets - 10 reps  - Seated Upper Trapezius Stretch  - 3 x daily - 7 x weekly - 1 sets - 5 reps - 30 seconds  hold - Seated Scapular Retraction  - 1 x daily - 7 x weekly - 3 sets - 10 reps - 5 seconds hold - Isometric Tricep Extension   - 1 x daily - 7 x weekly - 3 sets - 10 reps - 5 seconds hold      PT Short Term Goals - 03/09/23 1600       PT SHORT TERM GOAL #1   Title Pt will be independent with his initial HEP to decrease pain, improve shoulder AROM, strength, function, and ability to reach more comfortably.    Time 3    Period Weeks    Status New    Target Date 03/31/23              PT Long Term Goals - 03/09/23 1602       PT LONG TERM GOAL #1   Title Pt will have a decrease in R shoulder pain to 4/10 or less at worst to promote ability to reach up, reach behind, as well as don and doff clothes more comfortably.    Baseline 8-9/10 R shoulder joint pain at worst for the past month based on pt interview (03/09/2023)    Time 8    Period Weeks  Status New    Target Date 05/05/23      PT LONG TERM GOAL #2   Title Pt will have a decrease in R lateral neck pain to 3/10 or less at worst to promote ability to reach, look around  more comfortably.    Baseline 7-9/10 R lateral neck pain at worst for the past month (feels like throbbing)  based on patient interview (03/09/2023)    Time 8    Period Weeks    Status New    Target Date 05/05/23      PT LONG TERM GOAL #3   Title Pt will improve his R shoulder FOTO score by at least 10 points as a demonstration of improved function.    Baseline Unable to obtain FOTO at this time. Will try next visit (03/09/2023)    Time 8    Period Weeks    Status New    Target Date 05/05/23      PT LONG TERM GOAL #4   Title Pt will improve R shoulder flexion and abduction AROM by at least 20 degrees to promote ability to reach up more comfortably.    Baseline R shoulder AROM: 82 degrees flexion with pain, 62 degrees abduction with pain (03/09/2023)    Time 8    Period Weeks    Status New    Target Date 05/05/23      PT LONG TERM GOAL #5   Title Pt will improve R shoulder functional IR to R thumb to T12 spinous process to promote ability to don and doff clothes more comfortably.    Baseline R shoulder functional IR: R thumb to sacrum (03/09/2023)    Time 8    Period Weeks    Status New    Target Date 05/05/23              Plan - 04/06/23 1348     Clinical Impression Statement Pt only wanted his blood pressure taken secondary to having to leave and take someone to her 2 pm appointment. No physical therapy was performed.    Personal Factors and Comorbidities Age;Fitness;Time since onset of injury/illness/exacerbation;Comorbidity 3+    Comorbidities Arthritis, cardiomyopathy, chronic back pain, CHF, HTN, obesity    Examination-Activity Limitations Dressing;Bathing;Transfers;Bed Mobility;Hygiene/Grooming;Sleep;Lift;Caring for Others;Carry;Reach Overhead;Toileting    Stability/Clinical Decision Making Stable/Uncomplicated    Rehab Potential Fair    PT Frequency 2x / week    PT Duration 8 weeks    PT Treatment/Interventions Therapeutic activities;Therapeutic exercise;Neuromuscular  re-education;Patient/family education;Manual techniques;Dry needling;Electrical Stimulation;Iontophoresis 4mg /ml Dexamethasone    PT Next Visit Plan posture, scapular and ER strengthening, improve glenohumeral mechanics, manual techniques, modalities PRN    PT Home Exercise Plan Medbridge [email or web address]    Consulted and Agree with Plan of Care Patient               Loralyn Freshwater PT, DPT   04/06/2023, 2:02 PM

## 2023-04-07 ENCOUNTER — Encounter (INDEPENDENT_AMBULATORY_CARE_PROVIDER_SITE_OTHER): Payer: Medicare HMO | Admitting: Cardiology

## 2023-04-07 DIAGNOSIS — G4733 Obstructive sleep apnea (adult) (pediatric): Secondary | ICD-10-CM | POA: Diagnosis not present

## 2023-04-11 ENCOUNTER — Ambulatory Visit: Payer: Medicare HMO | Attending: Family

## 2023-04-11 DIAGNOSIS — I5022 Chronic systolic (congestive) heart failure: Secondary | ICD-10-CM

## 2023-04-11 DIAGNOSIS — G4733 Obstructive sleep apnea (adult) (pediatric): Secondary | ICD-10-CM

## 2023-04-11 NOTE — Procedures (Signed)
SLEEP STUDY REPORT Patient Information Study Date: 04/07/2023 Patient Name: Ronald Horne Patient ID: [id number] Birth Date: [birth year] Age: 72 Gender:  BMI: 40.9 (W=308 lb, H=6' 1'') Stopbang: 6 Referring Physician: Clarisa Kindred, NP  TEST DESCRIPTION: Home sleep apnea testing was completed using the WatchPat, a Type 1 device, utilizing peripheral arterial tonometry (PAT), chest movement, actigraphy, pulse oximetry, pulse rate, body position and snore.  AHI was calculated with apnea and hypopnea using valid sleep time as the denominator. RDI includes apneas, hypopneas, and RERAs.  The data acquired and the scoring of sleep and all associated events were performed in accordance with the recommended standards and specifications as outlined in the AASM Manual for the Scoring of Sleep and Associated Events 2.2.0 (2015).  FINDINGS:  1.  Severe Obstructive Sleep Apnea with AHI 57.3/hr.   2.  Moderate Central Sleep Apnea with pAHIc 20.8/hr with 33% Cheyne Stoke respirations.  3.  Oxygen desaturations as low as 88%.  4.  Severe snoring was present. O2 sats were < 88% for 0 min.  5.  Total sleep time was 4 hrs and 39 min.  6.  23.9% of total sleep time was spent in REM sleep.   7.  Normal sleep onset latency at 23 min.   8.  Prolonged REM sleep onset latency at 205 min.   9.  Total awakenings were 19.  10. Arrhythmia detection: None.  DIAGNOSIS:   Severe Obstructive Sleep Apnea (G47.33) Moderate Central Sleep Apnea  RECOMMENDATIONS:   1.  Clinical correlation of these findings is necessary.  The decision to treat obstructive sleep apnea (OSA) is usually based on the presence of apnea symptoms or the presence of associated medical conditions such as Hypertension, Congestive Heart Failure, Atrial Fibrillation or Obesity.  The most common symptoms of OSA are snoring, gasping for breath while sleeping, daytime sleepiness and fatigue.   2.  Initiating apnea therapy is recommended given the  presence of symptoms and/or associated conditions. Recommend proceeding with one of the following:     a.  Auto-CPAP therapy with a pressure range of 5-20cm H2O.     b.  An oral appliance (OA) that can be obtained from certain dentists with expertise in sleep medicine.  These are primarily of use in non-obese patients with mild and moderate disease.     c.  An ENT consultation which may be useful to look for specific causes of obstruction and possible treatment options.     d.  If patient is intolerant to PAP therapy, consider referral to ENT for evaluation for hypoglossal nerve stimulator.   3.  Close follow-up is necessary to ensure success with CPAP or oral appliance therapy for maximum benefit.  4.  A follow-up oximetry study on CPAP is recommended to assess the adequacy of therapy and determine the need for supplemental oxygen or the potential need for Bi-level therapy.  An arterial blood gas to determine the adequacy of baseline ventilation and oxygenation should also be considered.  5.  Healthy sleep recommendations include:  adequate nightly sleep (normal 7-9 hrs/night), avoidance of caffeine after noon and alcohol near bedtime, and maintaining a sleep environment that is cool, dark and quiet.  6.  Weight loss for overweight patients is recommended.  Even modest amounts of weight loss can significantly improve the severity of sleep apnea.  7.  Snoring recommendations include:  weight loss where appropriate, side sleeping, and avoidance of alcohol before bed.  8.  Operation of motor vehicle should be avoided  when sleepy.  Signature: Armanda Magic, MD; [facility]; Diplomat, American Board of Sleep Medicine Electronically Signed: 04/11/2023 11:11:52 AM

## 2023-04-12 ENCOUNTER — Telehealth: Payer: Self-pay

## 2023-04-12 ENCOUNTER — Ambulatory Visit: Payer: Medicare HMO

## 2023-04-12 DIAGNOSIS — M542 Cervicalgia: Secondary | ICD-10-CM

## 2023-04-12 DIAGNOSIS — M25511 Pain in right shoulder: Secondary | ICD-10-CM | POA: Diagnosis not present

## 2023-04-12 NOTE — Telephone Encounter (Signed)
Left patient VM to return my call regarding sleep results.

## 2023-04-12 NOTE — Telephone Encounter (Signed)
-----   Message from Quintella Reichert, MD sent at 04/11/2023 11:13 AM EDT ----- Please let patient know that they have sleep apnea.  Recommend therapeutic CPAP titration for treatment of patient's sleep disordered breathing.  If unable to perform an in lab titration then initiate ResMed auto CPAP from 4 to 15cm H2O with heated humidity and mask of choice and overnight pulse ox on CPAP.

## 2023-04-12 NOTE — Therapy (Signed)
OUTPATIENT PHYSICAL THERAPY TREATMENT NOTE   Patient Name: Ronald Horne MRN: [id number] DOB:02-16-1951, 72 y.o., male Today's Date: 04/12/2023  PCP: Sherrie Mustache, MD  REFERRING PROVIDER: Jerrel Ivory, DO   END OF SESSION:  PT End of Session - 04/12/23 0731     Visit Number 8    Number of Visits 17    Date for PT Re-Evaluation 05/05/23    PT Start Time 0731    PT Stop Time 0814    PT Time Calculation (min) 43 min    Activity Tolerance Patient tolerated treatment well    Behavior During Therapy [facility] for tasks assessed/performed                   Past Medical History:  Diagnosis Date   Arthritis    Cardiomyopathy (HCC)    a. 08/2016 Echo: EF 30-35%, diff HK; b. 03/2018 Ex MV ([facility]): Ex time 2:00, large anter defect, EF 24%; c. 03/2018 Echo: EF 20-25%, diff HK. Gr1 DD; d. Refused cath.   Chronic back pain    Chronic combined systolic (congestive) and diastolic (congestive) heart failure (HCC)    a. 08/2016 Echo: EF 30-35%, diff HK, mild to mod MR, mild BAE; b. 03/2018 Echo: EF 20-25%, diff HK. Gr1 DD. MIldly dil Ao root. Mild BAE. Mildly reduced RV fxn.   GERD (gastroesophageal reflux disease)    Gout    Hypertension    Obesity    Past Surgical History:  Procedure Laterality Date   EPIDURAL BLOCK INJECTION     chronic back pain    LEFT HEART CATH AND CORONARY ANGIOGRAPHY Left 12/27/2019   Procedure: LEFT HEART CATH AND CORONARY ANGIOGRAPHY;  Surgeon: Laurier Nancy, MD;  Location: [facility] INVASIVE CV LAB;  Service: Cardiovascular;  Laterality: Left;   TONSILLECTOMY     Patient Active Problem List   Diagnosis Date Noted   PAD (peripheral artery disease) (HCC) 09/02/2022   Acute respiratory failure with hypoxia (HCC) 07/31/2022   CHF exacerbation (HCC) 07/30/2022   Acute on chronic systolic CHF (congestive heart failure) (HCC) 07/30/2022   Hypokalemia 07/30/2022   Hyperglycemia 07/30/2022   Elevated troponin 07/30/2022   Obesity    Hypertension     GERD (gastroesophageal reflux disease)    Hyperthyroidism    Acute coronary syndrome (HCC) 11/28/2019    REFERRING DIAG: M25.511 (ICD-10-CM) - Pain in right shoulder M75.41 (ICD-10-CM) - Impingement syndrome of right shoulder M19.011 (ICD-10-CM) - Primary osteoarthritis, right shoulder M77.8 (ICD-10-CM) - Other enthesopathies, not elsewhere classified   THERAPY DIAG:  Right shoulder pain, unspecified chronicity  Cervicalgia  Rationale for Evaluation and Treatment Rehabilitation  PERTINENT HISTORY: R shoulder pain. MD wants pt to try PT first before getting the shots. R shoulder pain has been going on for about a month or so. Might be due to his bed. Pt was also walks his dog and his dog tugs on his shoulder which might have bothered it. Pain might have also been lifting water from his car. Pain is off and on, today it might be 9/10, tomorrow might be 5/10. Pt is R hand dominant. 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 Sees a heart specialist regularly, about every 3 weeks now. Main area of pain is anterior and lateral shoulder joint. Also has pain in the R lateral neck area.   PRECAUTIONS: Hx of Cardiomyopathy, CHF   SUBJECTIVE:   SUBJECTIVE STATEMENT: R shoulder is about  the same. 5-6/10 currently. Has not been doing all his HEP.      PAIN:  Are you having pain? See subjective   TODAY'S TREATMENT:                                                                                                                                         DATE: 04/12/2023  Precautions: hx of CHF   Decreased R shoulder pain with flexion with scapular retraction    (+) empty can and Hawkins kennedy R shoulder   Demonstrates scapular compensation    No latex allergies    Therapeutic exercise   Blood pressure, L arm sitting, mechanically taken, normal cuff: 110/78, HR 91  Emphasized importance of performing his HEP to improve his R  shoulder pain and function  Standing R shoulder AROM flexion, abduction, functional IR 1x each way   Seated triceps extension isometrics with hand on table 10x3 with 5 second holds  Good posterior glide of humeral head palpated  Seated R shoulder ER yellow band 10x3  Seated R shoulder IR red band 10x3  Seated B scapular retraction red band 10x   Seated B scapular retraction with chin tuck 5x 5 seconds for 4 sets  Verbal and tactile cues to decrease R lateral shift of the neck   Seated scapular retraction with   R shoulder flexion with PT assist 10x   With manually resisted extension 3x      Improved exercise technique, movement at target joints, use of target muscles after mod verbal, visual, tactile cues.        Response to treatment Pt tolerated session well without aggravation of symptoms. Feels better after session reported.          Clinical impression  Improved R shoulder flexion and abduction AROM as well as R lateral neck and R shoulder pain since initial evaluation. Continued working on decreasing R upper trap muscle tension, improving scapular, R shoulder ER, IR, and triceps strength to promote better glenohumeral mechanics. Pt tolerated session well without aggravation of symptoms. Pt will benefit from continued skilled physical therapy services to decrease pain, improve strength and function.      PATIENT EDUCATION: Education details: there-ex, HEP Person educated: Patient Education method: Explanation, Demonstration, Tactile cues, Verbal cues, and Handouts Education comprehension: verbalized understanding and returned demonstration  HOME EXERCISE PROGRAM: Access Code: [email or web address] URL: [email or web address] Date: 03/14/2023 Prepared by: Loralyn Freshwater  Exercises - Standing Shoulder External Rotation with Resistance  - 1 x daily - 7 x weekly - 3 sets - 10 reps  - Seated Upper Trapezius Stretch  - 3 x daily - 7 x weekly - 1 sets - 5 reps  - 30 seconds  hold - Seated Scapular Retraction  - 1 x daily - 7 x weekly - 3 sets - 10 reps - 5 seconds hold - Isometric Tricep Extension   - 1  x daily - 7 x weekly - 3 sets - 10 reps - 5 seconds hold      PT Short Term Goals - 04/12/23 0737       PT SHORT TERM GOAL #1   Title Pt will be independent with his initial HEP to decrease pain, improve shoulder AROM, strength, function, and ability to reach more comfortably.    Baseline Does some of the HEP, not all. Helps when he does them. No questions (04/12/2023)    Time 3    Period Weeks    Status On-going    Target Date 03/31/23              PT Long Term Goals - 04/12/23 0738       PT LONG TERM GOAL #1   Title Pt will have a decrease in R shoulder pain to 4/10 or less at worst to promote ability to reach up, reach behind, as well as don and doff clothes more comfortably.    Baseline 8-9/10 R shoulder joint pain at worst for the past month based on pt interview (03/09/2023); 7/10 R shoulder pain at most for the past 7 days (04/12/2023)    Time 8    Period Weeks    Status Partially Met    Target Date 05/05/23      PT LONG TERM GOAL #2   Title Pt will have a decrease in R lateral neck pain to 3/10 or less at worst to promote ability to reach, look around more comfortably.    Baseline 7-9/10 R lateral neck pain at worst for the past month (feels like throbbing)  based on patient interview (03/09/2023); 6-7/10 at most for the past 7 days (04/12/2023)    Time 8    Period Weeks    Status Partially Met    Target Date 05/05/23      PT LONG TERM GOAL #3   Title Pt will improve his R shoulder FOTO score by at least 10 points as a demonstration of improved function.    Baseline Unable to obtain FOTO at this time. Will try next visit (03/09/2023)    Time 8    Period Weeks    Status New    Target Date 05/05/23      PT LONG TERM GOAL #4   Title Pt will improve R shoulder flexion and abduction AROM by at least 20 degrees to promote ability  to reach up more comfortably.    Baseline R shoulder AROM: 82 degrees flexion with pain, 62 degrees abduction with pain (03/09/2023); 120 degrees, 82 abduction (04/12/2023)    Time 8    Period Weeks    Status Achieved    Target Date 05/05/23      PT LONG TERM GOAL #5   Title Pt will improve R shoulder functional IR to R thumb to T12 spinous process to promote ability to don and doff clothes more comfortably.    Baseline R shoulder functional IR: R thumb to sacrum (03/09/2023), (04/12/2023)    Time 8    Period Weeks    Status On-going    Target Date 05/05/23              Plan - 04/12/23 0731     Clinical Impression Statement Improved R shoulder flexion and abduction AROM as well as R lateral neck and R shoulder pain since initial evaluation. Continued working on decreasing R upper trap muscle tension, improving scapular, R shoulder ER, IR, and triceps strength  to promote better glenohumeral mechanics. Pt tolerated session well without aggravation of symptoms. Pt will benefit from continued skilled physical therapy services to decrease pain, improve strength and function.    Personal Factors and Comorbidities Age;Fitness;Time since onset of injury/illness/exacerbation;Comorbidity 3+    Comorbidities Arthritis, cardiomyopathy, chronic back pain, CHF, HTN, obesity    Examination-Activity Limitations Dressing;Bathing;Transfers;Bed Mobility;Hygiene/Grooming;Sleep;Lift;Caring for Others;Carry;Reach Overhead;Toileting    Stability/Clinical Decision Making Stable/Uncomplicated    Rehab Potential Fair    PT Frequency 2x / week    PT Duration 8 weeks    PT Treatment/Interventions Therapeutic activities;Therapeutic exercise;Neuromuscular re-education;Patient/family education;Manual techniques;Dry needling;Electrical Stimulation;Iontophoresis 4mg /ml Dexamethasone    PT Next Visit Plan posture, scapular and ER strengthening, improve glenohumeral mechanics, manual techniques, modalities PRN    PT Home  Exercise Plan Medbridge [email or web address]    Consulted and Agree with Plan of Care Patient               Loralyn Freshwater PT, DPT   04/12/2023, 9:23 AM

## 2023-04-13 NOTE — Telephone Encounter (Signed)
Patient returned call

## 2023-04-14 ENCOUNTER — Telehealth: Payer: Self-pay

## 2023-04-14 ENCOUNTER — Ambulatory Visit: Payer: Medicare HMO

## 2023-04-14 DIAGNOSIS — M25511 Pain in right shoulder: Secondary | ICD-10-CM | POA: Diagnosis not present

## 2023-04-14 DIAGNOSIS — M542 Cervicalgia: Secondary | ICD-10-CM

## 2023-04-14 NOTE — Telephone Encounter (Signed)
Notified patient of sleep study results and recommendations. All questions v(if any) were answered. Patient verbalized understanding. CPAP machine and supplies ordered 04/14/23 through [employer].

## 2023-04-14 NOTE — Therapy (Signed)
OUTPATIENT PHYSICAL THERAPY TREATMENT NOTE   Patient Name: Ronald Horne MRN: [id number] DOB:Mar 15, 1951, 72 y.o., male Today's Date: 04/14/2023  PCP: Sherrie Mustache, MD  REFERRING PROVIDER: Jerrel Ivory, DO   END OF SESSION:  PT End of Session - 04/14/23 0732     Visit Number 9    Number of Visits 17    Date for PT Re-Evaluation 05/05/23    PT Start Time 0732    PT Stop Time 0813    PT Time Calculation (min) 41 min    Activity Tolerance Patient tolerated treatment well    Behavior During Therapy [facility] for tasks assessed/performed                    Past Medical History:  Diagnosis Date   Arthritis    Cardiomyopathy (HCC)    a. 08/2016 Echo: EF 30-35%, diff HK; b. 03/2018 Ex MV (Jadali): Ex time 2:00, large anter defect, EF 24%; c. 03/2018 Echo: EF 20-25%, diff HK. Gr1 DD; d. Refused cath.   Chronic back pain    Chronic combined systolic (congestive) and diastolic (congestive) heart failure (HCC)    a. 08/2016 Echo: EF 30-35%, diff HK, mild to mod MR, mild BAE; b. 03/2018 Echo: EF 20-25%, diff HK. Gr1 DD. MIldly dil Ao root. Mild BAE. Mildly reduced RV fxn.   GERD (gastroesophageal reflux disease)    Gout    Hypertension    Obesity    Past Surgical History:  Procedure Laterality Date   EPIDURAL BLOCK INJECTION     chronic back pain    LEFT HEART CATH AND CORONARY ANGIOGRAPHY Left 12/27/2019   Procedure: LEFT HEART CATH AND CORONARY ANGIOGRAPHY;  Surgeon: Laurier Nancy, MD;  Location: [facility] INVASIVE CV LAB;  Service: Cardiovascular;  Laterality: Left;   TONSILLECTOMY     Patient Active Problem List   Diagnosis Date Noted   PAD (peripheral artery disease) (HCC) 09/02/2022   Acute respiratory failure with hypoxia (HCC) 07/31/2022   CHF exacerbation (HCC) 07/30/2022   Acute on chronic systolic CHF (congestive heart failure) (HCC) 07/30/2022   Hypokalemia 07/30/2022   Hyperglycemia 07/30/2022   Elevated troponin 07/30/2022   Obesity    Hypertension     GERD (gastroesophageal reflux disease)    Hyperthyroidism    Acute coronary syndrome (HCC) 11/28/2019    REFERRING DIAG: M25.511 (ICD-10-CM) - Pain in right shoulder M75.41 (ICD-10-CM) - Impingement syndrome of right shoulder M19.011 (ICD-10-CM) - Primary osteoarthritis, right shoulder M77.8 (ICD-10-CM) - Other enthesopathies, not elsewhere classified   THERAPY DIAG:  Right shoulder pain, unspecified chronicity  Cervicalgia  Rationale for Evaluation and Treatment Rehabilitation  PERTINENT HISTORY: R shoulder pain. MD wants pt to try PT first before getting the shots. R shoulder pain has been going on for about a month or so. Might be due to his bed. Pt was also walks his dog and his dog tugs on his shoulder which might have bothered it. Pain might have also been lifting water from his car. Pain is off and on, today it might be 9/10, tomorrow might be 5/10. Pt is R hand dominant. 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 Sees a heart specialist regularly, about every 3 weeks now. Main area of pain is anterior and lateral shoulder joint. Also has pain in the R lateral neck area.   PRECAUTIONS: Hx of Cardiomyopathy, CHF   SUBJECTIVE:   SUBJECTIVE STATEMENT: R shoulder and  neck needs to loosen up. Neck muscle feels stiff. 5-6/10 R lateral neck and shoulder pain.    PAIN:  Are you having pain? See subjective   TODAY'S TREATMENT:                                                                                                                                         DATE: 04/14/2023  Precautions: hx of CHF   Decreased R shoulder pain with flexion with scapular retraction    (+) empty can and Hawkins kennedy R shoulder   Demonstrates scapular compensation    No latex allergies    Therapeutic exercise   Blood pressure, L arm sitting, mechanically taken, normal cuff: 133/88, HR 93   Seated triceps extension isometrics with  hand on table 10x3 with 5 second holds  Good posterior glide of humeral head palpated   Seated B scapular retraction with chin tuck 10x 5 seconds for 2 sets  Verbal and tactile cues to decrease R lateral shift of the neck  Seated scapular retraction with   R shoulder flexion with PT assist 10x    Seated R scapular depression isometrics with forearm on arm rest  10x5 seconds for 3 sets  Decreased R lateral neck stiffness   Seated R shoulder IR red band 10x3   Improved exercise technique, movement at target joints, use of target muscles after mod verbal, visual, tactile cues.        Response to treatment Pt tolerated session well without aggravation of symptoms. Feels better after session reported.          Clinical impression Decreased R lateral neck pain with treatment to improve scapular strength and decreasing R upper trap and scalene muscle tension. Difficulty with scapular mechanics with pt often needing verbal, and tactile cues for scapular retraction, depression and posterior tipping. Pt demonstrates tendency for over activation of R upper trap and scalene muscles.   Pt tolerated session well without aggravation of symptoms. Pt will benefit from continued skilled physical therapy services to decrease pain, improve strength and function.      PATIENT EDUCATION: Education details: there-ex, HEP Person educated: Patient Education method: Explanation, Demonstration, Tactile cues, Verbal cues, and Handouts Education comprehension: verbalized understanding and returned demonstration  HOME EXERCISE PROGRAM: Access Code: [email or web address] URL: [email or web address] Date: 03/14/2023 Prepared by: Loralyn Freshwater  Exercises - Standing Shoulder External Rotation with Resistance  - 1 x daily - 7 x weekly - 3 sets - 10 reps  - Seated Upper Trapezius Stretch  - 3 x daily - 7 x weekly - 1 sets - 5 reps - 30 seconds  hold - Seated Scapular Retraction  - 1 x daily - 7 x  weekly - 3 sets - 10 reps - 5 seconds hold - Isometric Tricep Extension   - 1 x daily - 7 x weekly - 3 sets - 10 reps - 5 seconds  hold  Included scapular depression isometrics with the triceps exercise (04/14/2023)   To help decrease R upper trap and scalene muscle tension      PT Short Term Goals - 04/12/23 0737       PT SHORT TERM GOAL #1   Title Pt will be independent with his initial HEP to decrease pain, improve shoulder AROM, strength, function, and ability to reach more comfortably.    Baseline Does some of the HEP, not all. Helps when he does them. No questions (04/12/2023)    Time 3    Period Weeks    Status On-going    Target Date 03/31/23              PT Long Term Goals - 04/12/23 0738       PT LONG TERM GOAL #1   Title Pt will have a decrease in R shoulder pain to 4/10 or less at worst to promote ability to reach up, reach behind, as well as don and doff clothes more comfortably.    Baseline 8-9/10 R shoulder joint pain at worst for the past month based on pt interview (03/09/2023); 7/10 R shoulder pain at most for the past 7 days (04/12/2023)    Time 8    Period Weeks    Status Partially Met    Target Date 05/05/23      PT LONG TERM GOAL #2   Title Pt will have a decrease in R lateral neck pain to 3/10 or less at worst to promote ability to reach, look around more comfortably.    Baseline 7-9/10 R lateral neck pain at worst for the past month (feels like throbbing)  based on patient interview (03/09/2023); 6-7/10 at most for the past 7 days (04/12/2023)    Time 8    Period Weeks    Status Partially Met    Target Date 05/05/23      PT LONG TERM GOAL #3   Title Pt will improve his R shoulder FOTO score by at least 10 points as a demonstration of improved function.    Baseline Unable to obtain FOTO at this time. Will try next visit (03/09/2023)    Time 8    Period Weeks    Status New    Target Date 05/05/23      PT LONG TERM GOAL #4   Title Pt will improve R  shoulder flexion and abduction AROM by at least 20 degrees to promote ability to reach up more comfortably.    Baseline R shoulder AROM: 82 degrees flexion with pain, 62 degrees abduction with pain (03/09/2023); 120 degrees, 82 abduction (04/12/2023)    Time 8    Period Weeks    Status Achieved    Target Date 05/05/23      PT LONG TERM GOAL #5   Title Pt will improve R shoulder functional IR to R thumb to T12 spinous process to promote ability to don and doff clothes more comfortably.    Baseline R shoulder functional IR: R thumb to sacrum (03/09/2023), (04/12/2023)    Time 8    Period Weeks    Status On-going    Target Date 05/05/23              Plan - 04/14/23 0732     Clinical Impression Statement Decreased R lateral neck pain with treatment to improve scapular strength and decreasing R upper trap and scalene muscle tension. Difficulty with scapular mechanics with pt often needing verbal, and tactile  cues for scapular retraction, depression and posterior tipping. Pt demonstrates tendency for over activation of R upper trap and scalene muscles.   Pt tolerated session well without aggravation of symptoms. Pt will benefit from continued skilled physical therapy services to decrease pain, improve strength and function.    Personal Factors and Comorbidities Age;Fitness;Time since onset of injury/illness/exacerbation;Comorbidity 3+    Comorbidities Arthritis, cardiomyopathy, chronic back pain, CHF, HTN, obesity    Examination-Activity Limitations Dressing;Bathing;Transfers;Bed Mobility;Hygiene/Grooming;Sleep;Lift;Caring for Others;Carry;Reach Overhead;Toileting    Stability/Clinical Decision Making Stable/Uncomplicated    Rehab Potential Fair    PT Frequency 2x / week    PT Duration 8 weeks    PT Treatment/Interventions Therapeutic activities;Therapeutic exercise;Neuromuscular re-education;Patient/family education;Manual techniques;Dry needling;Electrical Stimulation;Iontophoresis 4mg /ml  Dexamethasone    PT Next Visit Plan posture, scapular and ER strengthening, improve glenohumeral mechanics, manual techniques, modalities PRN    PT Home Exercise Plan Medbridge [email or web address]    Consulted and Agree with Plan of Care Patient               Loralyn Freshwater PT, DPT   04/14/2023, 9:13 AM

## 2023-04-14 NOTE — Telephone Encounter (Signed)
Patient called and was concerned about taking [place] and being newly diagnosed with sleep apnea. Transferred to nurse for further clarification.

## 2023-04-17 ENCOUNTER — Telehealth: Payer: Self-pay

## 2023-04-17 NOTE — Telephone Encounter (Signed)
Patient called with concerns regarding his sleep study, patient stated he was told his heart stopped beating 58 times. Patient was very concerned about this, it was explained to the patient that a sleep study checks to see how many times a night that you stop breathing not that your heart stops beating. Patient seemed to be more relaxed by the end of the conversation.

## 2023-04-19 ENCOUNTER — Ambulatory Visit: Payer: Medicare HMO

## 2023-04-19 DIAGNOSIS — M542 Cervicalgia: Secondary | ICD-10-CM

## 2023-04-19 DIAGNOSIS — M6281 Muscle weakness (generalized): Secondary | ICD-10-CM

## 2023-04-19 DIAGNOSIS — M25512 Pain in left shoulder: Secondary | ICD-10-CM

## 2023-04-19 DIAGNOSIS — M25511 Pain in right shoulder: Secondary | ICD-10-CM

## 2023-04-19 NOTE — Therapy (Signed)
OUTPATIENT PHYSICAL THERAPY TREATMENT NOTE And Progress Report (03/09/2023 - 04/19/2023)   Patient Name: Ronald Horne MRN: [id number] DOB:[birth year], 72 y.o., male Today's Date: 04/19/2023  PCP: Sherrie Mustache, MD  REFERRING PROVIDER: Jerrel Ivory, DO   END OF SESSION:  PT End of Session - 04/19/23 0736     Visit Number 10    Number of Visits 17    Date for PT Re-Evaluation 05/05/23    PT Start Time 0736    PT Stop Time 0815    PT Time Calculation (min) 39 min    Activity Tolerance Patient tolerated treatment well    Behavior During Therapy [facility] for tasks assessed/performed                     Past Medical History:  Diagnosis Date   Arthritis    Cardiomyopathy (HCC)    a. 08/2016 Echo: EF 30-35%, diff HK; b. 03/2018 Ex MV ([facility]): Ex time 2:00, large anter defect, EF 24%; c. 03/2018 Echo: EF 20-25%, diff HK. Gr1 DD; d. Refused cath.   Chronic back pain    Chronic combined systolic (congestive) and diastolic (congestive) heart failure (HCC)    a. 08/2016 Echo: EF 30-35%, diff HK, mild to mod MR, mild BAE; b. 03/2018 Echo: EF 20-25%, diff HK. Gr1 DD. MIldly dil Ao root. Mild BAE. Mildly reduced RV fxn.   GERD (gastroesophageal reflux disease)    Gout    Hypertension    Obesity    Past Surgical History:  Procedure Laterality Date   EPIDURAL BLOCK INJECTION     chronic back pain    LEFT HEART CATH AND CORONARY ANGIOGRAPHY Left 12/27/2019   Procedure: LEFT HEART CATH AND CORONARY ANGIOGRAPHY;  Surgeon: Laurier Nancy, MD;  Location: [facility] INVASIVE CV LAB;  Service: Cardiovascular;  Laterality: Left;   TONSILLECTOMY     Patient Active Problem List   Diagnosis Date Noted   PAD (peripheral artery disease) (HCC) 09/02/2022   Acute respiratory failure with hypoxia (HCC) 07/31/2022   CHF exacerbation (HCC) 07/30/2022   Acute on chronic systolic CHF (congestive heart failure) (HCC) 07/30/2022   Hypokalemia 07/30/2022   Hyperglycemia 07/30/2022   Elevated  troponin 07/30/2022   Obesity    Hypertension    GERD (gastroesophageal reflux disease)    Hyperthyroidism    Acute coronary syndrome (HCC) 11/28/2019    REFERRING DIAG: M25.511 (ICD-10-CM) - Pain in right shoulder M75.41 (ICD-10-CM) - Impingement syndrome of right shoulder M19.011 (ICD-10-CM) - Primary osteoarthritis, right shoulder M77.8 (ICD-10-CM) - Other enthesopathies, not elsewhere classified   THERAPY DIAG:  Right shoulder pain, unspecified chronicity  Cervicalgia  Acute pain of left shoulder  Muscle weakness (generalized)  Rationale for Evaluation and Treatment Rehabilitation  PERTINENT HISTORY: R shoulder pain. MD wants pt to try PT first before getting the shots. R shoulder pain has been going on for about a month or so. Might be due to his bed. Pt was also walks his dog and his dog tugs on his shoulder which might have bothered it. Pain might have also been lifting water from his car. Pain is off and on, today it might be 9/10, tomorrow might be 5/10. Pt is R hand dominant. 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 Sees a heart specialist regularly, about every 3 weeks now. Main area of pain is anterior and lateral shoulder joint. Also has pain in the R lateral neck area.  PRECAUTIONS: Hx of Cardiomyopathy, CHF   SUBJECTIVE:   SUBJECTIVE STATEMENT: R shoulder is alright, a little stiff, the neck is about the same. Has been doing his HEP. 6/10 R lateral neck and shoulder pain currently. Got the sleep apnea test, and has it bad. Doing the stretches at the session helps. Does the stretches at home which help.      PAIN:  Are you having pain? See subjective   TODAY'S TREATMENT:                                                                                                                                         DATE: 04/19/2023  Precautions: hx of CHF   Decreased R shoulder pain with flexion with scapular  retraction    (+) empty can and Hawkins kennedy R shoulder   Demonstrates scapular compensation    No latex allergies    Therapeutic exercise  Standing R shoulder AROM  Reviewed progress/current status with PT towards goals.   Seated R shoulder Pulley AAROM   Flexion 10x5 seconds for 2 sets  Scaption/ Abduction 10x5 seconds    Cues for proper scapular retraction and depression.   Seated B shoulder ER yellow band 10x2   Improved exercise technique, movement at target joints, use of target muscles after mod verbal, visual, tactile cues.    Response to treatment R shoulder feels loose, after session.      Clinical impression Improved overall R shoulder flexion and abduction AROM since initial evaluation. Slight decrease in R shoulder joint and lateral neck pain. Challenges to progress include medical history (CHF, arthritis), deconditioning, inconsistencies with performing HEP (not performing all) and chronicity of condition. Continued working on [employer] with emphasis on scapular mechanics.  Pt tolerated session well without aggravation of symptoms. Pt will benefit from continued skilled physical therapy services to decrease pain, improve strength and function.      PATIENT EDUCATION: Education details: there-ex, HEP Person educated: Patient Education method: Explanation, Demonstration, Tactile cues, Verbal cues, and Handouts Education comprehension: verbalized understanding and returned demonstration  HOME EXERCISE PROGRAM: Access Code: [email or web address] URL: [email or web address] Date: 03/14/2023 Prepared by: Loralyn Freshwater  Exercises - Standing Shoulder External Rotation with Resistance  - 1 x daily - 7 x weekly - 3 sets - 10 reps  - Seated Upper Trapezius Stretch  - 3 x daily - 7 x weekly - 1 sets - 5 reps - 30 seconds  hold - Seated Scapular Retraction  - 1 x daily - 7 x weekly - 3 sets - 10 reps - 5 seconds hold - Isometric Tricep Extension   - 1 x daily - 7  x weekly - 3 sets - 10 reps - 5 seconds hold  Included scapular depression isometrics with the triceps exercise (04/14/2023)   To help decrease R upper trap and scalene muscle tension   - Seated Shoulder Flexion AAROM with Pulley  Behind  - 1 x daily - 7 x weekly - 3 sets - 10 reps - 5 seconds hold - Seated Shoulder Abduction AAROM with Pulley Behind  - 1 x daily - 7 x weekly - 3 sets - 10 reps - 5 seconds hold      PT Short Term Goals - 04/19/23 0739       PT SHORT TERM GOAL #1   Title Pt will be independent with his initial HEP to decrease pain, improve shoulder AROM, strength, function, and ability to reach more comfortably.    Baseline Does some of the HEP, not all. Helps when he does them. No questions (04/12/2023), no Questions (04/19/2023)    Time 3    Period Weeks    Status Achieved    Target Date 03/31/23              PT Long Term Goals - 04/19/23 0745       PT LONG TERM GOAL #1   Title Pt will have a decrease in R shoulder pain to 4/10 or less at worst to promote ability to reach up, reach behind, as well as don and doff clothes more comfortably.    Baseline 8-9/10 R shoulder joint pain at worst for the past month based on pt interview (03/09/2023); 7/10 R shoulder pain at most for the past 7 days (04/12/2023), 7/10 at most for the past 7 days (04/19/2023)    Time 8    Period Weeks    Status Partially Met    Target Date 05/05/23      PT LONG TERM GOAL #2   Title Pt will have a decrease in R lateral neck pain to 3/10 or less at worst to promote ability to reach, look around more comfortably.    Baseline 7-9/10 R lateral neck pain at worst for the past month (feels like throbbing)  based on patient interview (03/09/2023); 6-7/10 at most for the past 7 days (04/12/2023), 6/10 at most for the 7 days (04/19/2023)    Time 8    Period Weeks    Status Partially Met    Target Date 05/05/23      PT LONG TERM GOAL #3   Title Pt will improve his R shoulder FOTO score by at least 10  points as a demonstration of improved function.    Baseline Unable to obtain FOTO at this time. Will try next visit (03/09/2023)    Time 8    Period Weeks    Status New    Target Date 05/05/23      PT LONG TERM GOAL #4   Title Pt will improve R shoulder flexion and abduction AROM by at least 20 degrees to promote ability to reach up more comfortably.    Baseline R shoulder AROM: 82 degrees flexion with pain, 62 degrees abduction with pain (03/09/2023); 120 degrees, 82 abduction (04/12/2023); 108 degrees flexion, 70 degrees abduction (04/19/2023)    Time 8    Period Weeks    Status Partially Met    Target Date 05/05/23      PT LONG TERM GOAL #5   Title Pt will improve R shoulder functional IR to R thumb to T12 spinous process to promote ability to don and doff clothes more comfortably.    Baseline R shoulder functional IR: R thumb to sacrum (03/09/2023), (04/12/2023), (04/19/2023)    Time 8    Period Weeks    Status On-going    Target Date 05/05/23  Plan - 04/19/23 0735     Clinical Impression Statement Improved overall R shoulder flexion and abduction AROM since initial evaluation. Slight decrease in R shoulder joint and lateral neck pain. Challenges to progress include medical history (CHF, arthritis), deconditioning, inconsistencies with performing HEP (not performing all) and chronicity of condition. Continued working on [employer] with emphasis on scapular mechanics.  Pt tolerated session well without aggravation of symptoms. Pt will benefit from continued skilled physical therapy services to decrease pain, improve strength and function.    Personal Factors and Comorbidities Age;Fitness;Time since onset of injury/illness/exacerbation;Comorbidity 3+    Comorbidities Arthritis, cardiomyopathy, chronic back pain, CHF, HTN, obesity    Examination-Activity Limitations Dressing;Bathing;Transfers;Bed Mobility;Hygiene/Grooming;Sleep;Lift;Caring for Others;Carry;Reach Overhead;Toileting     Stability/Clinical Decision Making Stable/Uncomplicated    Rehab Potential Fair    PT Frequency 2x / week    PT Duration 8 weeks    PT Treatment/Interventions Therapeutic activities;Therapeutic exercise;Neuromuscular re-education;Patient/family education;Manual techniques;Dry needling;Electrical Stimulation;Iontophoresis 4mg /ml Dexamethasone    PT Next Visit Plan posture, scapular and ER strengthening, improve glenohumeral mechanics, manual techniques, modalities PRN    PT Home Exercise Plan Medbridge [email or web address]    Consulted and Agree with Plan of Care Patient             Thank you for your referral.    Loralyn Freshwater PT, DPT   04/19/2023, 9:22 AM

## 2023-04-21 ENCOUNTER — Encounter: Payer: Self-pay | Admitting: Cardiovascular Disease

## 2023-04-21 ENCOUNTER — Ambulatory Visit (INDEPENDENT_AMBULATORY_CARE_PROVIDER_SITE_OTHER): Payer: Medicare HMO | Admitting: Cardiovascular Disease

## 2023-04-21 ENCOUNTER — Ambulatory Visit: Payer: Medicare HMO

## 2023-04-21 VITALS — BP 105/70 | HR 74 | Ht 73.0 in | Wt 307.8 lb

## 2023-04-21 DIAGNOSIS — I249 Acute ischemic heart disease, unspecified: Secondary | ICD-10-CM

## 2023-04-21 DIAGNOSIS — M542 Cervicalgia: Secondary | ICD-10-CM

## 2023-04-21 DIAGNOSIS — E6609 Other obesity due to excess calories: Secondary | ICD-10-CM

## 2023-04-21 DIAGNOSIS — I5023 Acute on chronic systolic (congestive) heart failure: Secondary | ICD-10-CM

## 2023-04-21 DIAGNOSIS — I10 Essential (primary) hypertension: Secondary | ICD-10-CM

## 2023-04-21 DIAGNOSIS — I739 Peripheral vascular disease, unspecified: Secondary | ICD-10-CM

## 2023-04-21 DIAGNOSIS — M25512 Pain in left shoulder: Secondary | ICD-10-CM

## 2023-04-21 DIAGNOSIS — M6281 Muscle weakness (generalized): Secondary | ICD-10-CM

## 2023-04-21 DIAGNOSIS — Z6832 Body mass index (BMI) 32.0-32.9, adult: Secondary | ICD-10-CM

## 2023-04-21 DIAGNOSIS — G4733 Obstructive sleep apnea (adult) (pediatric): Secondary | ICD-10-CM | POA: Diagnosis not present

## 2023-04-21 DIAGNOSIS — M25511 Pain in right shoulder: Secondary | ICD-10-CM

## 2023-04-21 NOTE — Progress Notes (Signed)
Cardiology Office Note   Date:  04/21/2023   ID:  Ronald Horne, DOB 1951/06/12, MRN [id number]  PCP:  Sherrie Mustache, MD  Cardiologist:  Adrian Blackwater, MD      History of Present Illness: Ronald Horne is a 72 y.o. male who presents for  Chief Complaint  Patient presents with   Follow-up    1 mo F/U    Needs cpap as has severe sleep apnea, and o2.  Shortness of Breath This is a chronic problem. The current episode started more than 1 year ago. The problem has been unchanged.      Past Medical History:  Diagnosis Date   Arthritis    Cardiomyopathy (HCC)    a. 08/2016 Echo: EF 30-35%, diff HK; b. 03/2018 Ex MV (J[facility]): Ex time 2:00, large anter defect, EF 24%; c. 03/2018 Echo: EF 20-25%, diff HK. Gr1 DD; d. Refused cath.   Chronic back pain    Chronic combined systolic (congestive) and diastolic (congestive) heart failure (HCC)    a. 08/2016 Echo: EF 30-35%, diff HK, mild to mod MR, mild BAE; b. 03/2018 Echo: EF 20-25%, diff HK. Gr1 DD. MIldly dil Ao root. Mild BAE. Mildly reduced RV fxn.   GERD (gastroesophageal reflux disease)    Gout    Hypertension    Obesity      Past Surgical History:  Procedure Laterality Date   EPIDURAL BLOCK INJECTION     chronic back pain    LEFT HEART CATH AND CORONARY ANGIOGRAPHY Left 12/27/2019   Procedure: LEFT HEART CATH AND CORONARY ANGIOGRAPHY;  Surgeon: Laurier Nancy, MD;  Location: [facility] IN[facility]IVE CV [facility];  Service: Cardiovascular;  Laterality: Left;   TONSILLECTOMY       Current Outpatient Medications  Medication Sig Dispense Refill   albuterol (VENTOLIN HFA) 108 (90 Base) MCG/ACT inhaler Inhale into the lungs.     ascorbic acid (VITAMIN C) 500 MG tablet Take 500 mg by mouth daily.     aspirin 81 MG tablet Take 1 tablet (81 mg total) by mouth daily. (Patient not taking: Reported on 10/06/2022) 30 tablet 2   atorvastatin (LIPITOR) 10 MG tablet Take 1 tablet (10 mg total) by mouth daily. 30 tablet 2   CORLANOR 7.5 MG TABS  tablet Take 7.5 mg by mouth 2 (two) times daily.     digoxin (LANOXIN) 0.125 MG tablet Take 1 tablet (125 mcg total) by mouth daily. 30 tablet 11   ENTRESTO 97-103 MG Take 1 tablet by mouth 2 (two) times daily.     ergocalciferol (VITAMIN D2) 1.25 MG (50000 UT) capsule Take 50,000 Units by mouth once a week.     FARXIGA 10 MG TABS tablet Take 10 mg by mouth daily.     furosemide (LASIX) 40 MG tablet Take 1 tablet (40 mg total) by mouth daily. 90 tablet 3   methimazole (TAPAZOLE) 5 MG tablet Take 7.5 mg by mouth daily.     spironolactone (ALDACTONE) 25 MG tablet Take 12.5 mg by mouth daily.     Current Facility-Administered Medications  Medication Dose Route Frequency Provider Last Rate Last Admin   metoprolol succinate (TOPROL-XL) 24 hr tablet 200 mg  200 mg Oral Daily Laurier Nancy, MD       Facility-Administered Medications Ordered in Other Visits  Medication Dose Route Frequency Provider Last Rate Last Admin   sodium chloride flush (NS) 0.9 % injection 3 mL  3 mL Intravenous Q12H Laurier Nancy, MD  Allergies:   Patient has no known allergies.    Social History:   reports that he quit smoking about 46 years ago. His smoking use included cigarettes. He has a 3.00 pack-year smoking history. He has never used smokeless tobacco. He reports that he does not drink alcohol and does not use drugs.   Family History:  family history is not on file.    ROS:     Review of Systems  Constitutional: Negative.   HENT: Negative.    Eyes: Negative.   Respiratory:  Positive for shortness of breath.   Gastrointestinal: Negative.   Genitourinary: Negative.   Musculoskeletal: Negative.   Skin: Negative.   Neurological: Negative.   Endo/Heme/Allergies: Negative.   Psychiatric/Behavioral: Negative.    All other systems reviewed and are negative.     All other systems are reviewed and negative.    PHYSICAL EXAM: VS:  BP 105/70   Pulse 74   Ht 6\' 1"  (1.854 m)   Wt (!) 307 lb 12.8  oz (139.6 kg)   SpO2 96%   BMI 40.61 kg/m  , BMI Body mass index is 40.61 kg/m. Last weight:  Wt Readings from Last 3 Encounters:  04/21/23 (!) 307 lb 12.8 oz (139.6 kg)  04/03/23 (!) 308 lb (139.7 kg)  03/24/23 (!) 307 lb (139.3 kg)     Physical Exam Vitals reviewed.  Constitutional:      Appearance: Normal appearance. He is normal weight.  HENT:     Head: Normocephalic.     Nose: Nose normal.     Mouth/Throat:     Mouth: Mucous membranes are moist.  Eyes:     Pupils: Pupils are equal, round, and reactive to light.  Cardiovascular:     Rate and Rhythm: Normal rate and regular rhythm.     Pulses: Normal pulses.     Heart sounds: Normal heart sounds.  Pulmonary:     Effort: Pulmonary effort is normal.  Abdominal:     General: Abdomen is flat. Bowel sounds are normal.  Musculoskeletal:        General: Normal range of motion.     Cervical back: Normal range of motion.  Skin:    General: Skin is warm.  Neurological:     General: No focal deficit present.     Mental Status: He is alert.  Psychiatric:        Mood and Affect: Mood normal.       EKG:   Recent Labs: 07/30/2022: ALT 67; B Natriuretic Peptide 591.4; TSH 0.376 08/01/2022: BUN 24; Creatinine, Ser 1.10; Hemoglobin 14.6; Magnesium 2.1; Platelets 187; Potassium 3.8; Sodium 140    Lipid Panel No results found for: "CHOL", "TRIG", "HDL", "CHOLHDL", "VLDL", "LDLCALC", "LDLDIRECT"    Other studies Reviewed: Additional studies/ records that were reviewed today include:  Review of the above records demonstrates:      04/12/2018   11:13 AM  PAD Screen  Previous PAD dx? No  Previous surgical procedure? No  Pain with walking? No  Feet/toe relief with dangling? No  Painful, non-healing ulcers? No  Extremities discolored? No      ASSESSMENT AND PLAN:    ICD-10-CM   1. Obstructive sleep apnea syndrome  G47.33    on 04/07/23, had severe sleep apnea with AHI  57.3/hour, cheyne stoke rep. saturation less  than 88 pecent and need o2. dianosis G47.33. nEEDS CPAP/o2    2. Acute coronary syndrome (HCC)  I24.9     3. Acute on chronic  systolic CHF (congestive heart failure) (HCC)  I50.23    30-35 %, ef ON ECHO    4. Primary hypertension  I10     5. PAD (peripheral artery disease) (HCC)  I73.9     6. Class 1 obesity due to excess calories with body mass index (BMI) of 32.0 to 32.9 in adult, unspecified whether serious comorbidity present  E66.09    Z68.32        Problem List Items Addressed This Visit       Cardiovascular and Mediastinum   Acute coronary syndrome (HCC)   Acute on chronic systolic CHF (congestive heart failure) (HCC)   Hypertension   PAD (peripheral artery disease) (HCC)     Other   Obesity   Other Visit Diagnoses     Obstructive sleep apnea syndrome    -  Primary   on 04/07/23, had severe sleep apnea with AHI  57.3/hour, cheyne stoke rep. saturation less than 88 pecent and need o2. dianosis G47.33. nEEDS CPAP/o2          Disposition:   Return in about 4 weeks (around 05/19/2023) for SET UP CPAP AND F/U .    Total time spent: 30 minutes  Signed,  Adrian Blackwater, MD  04/21/2023 9:47 AM    [facility]

## 2023-04-21 NOTE — Therapy (Signed)
OUTPATIENT PHYSICAL THERAPY TREATMENT     Patient Name: Ronald Horne MRN: [id number] DOB:01/25/1951, 72 y.o., male Today's Date: 04/21/2023  PCP: Sherrie Mustache, MD  REFERRING PROVIDER: Jerrel Ivory, DO   END OF SESSION:  PT End of Session - 04/21/23 0733     Visit Number 11    Number of Visits 17    Date for PT Re-Evaluation 05/05/23    Authorization Type [employer] Primary; Medicaid [employer] Secondary    Authorization Time Period Auth for 24 visits 03/06/23-05/31/23    Progress Note Due on Visit 20    PT Start Time 0730    PT Stop Time 0810    PT Time Calculation (min) 40 min    Activity Tolerance Patient tolerated treatment well;No increased pain    Behavior During Therapy [facility] for tasks assessed/performed              Past Medical History:  Diagnosis Date   Arthritis    Cardiomyopathy (HCC)    a. 08/2016 Echo: EF 30-35%, diff HK; b. 03/2018 Ex MV ([facility]): Ex time 2:00, large anter defect, EF 24%; c. 03/2018 Echo: EF 20-25%, diff HK. Gr1 DD; d. Refused cath.   Chronic back pain    Chronic combined systolic (congestive) and diastolic (congestive) heart failure (HCC)    a. 08/2016 Echo: EF 30-35%, diff HK, mild to mod MR, mild BAE; b. 03/2018 Echo: EF 20-25%, diff HK. Gr1 DD. MIldly dil Ao root. Mild BAE. Mildly reduced RV fxn.   GERD (gastroesophageal reflux disease)    Gout    Hypertension    Obesity    Past Surgical History:  Procedure Laterality Date   EPIDURAL BLOCK INJECTION     chronic back pain    LEFT HEART CATH AND CORONARY ANGIOGRAPHY Left 12/27/2019   Procedure: LEFT HEART CATH AND CORONARY ANGIOGRAPHY;  Surgeon: Laurier Nancy, MD;  Location: [facility] INV[facility] CV [facility];  Service: Cardiovascular;  Laterality: Left;   TONSILLECTOMY     Patient Active Problem List   Diagnosis Date Noted   PAD (peripheral artery disease) (HCC) 09/02/2022   Acute respiratory failure with hypoxia (HCC) 07/31/2022   CHF exacerbation (HCC) 07/30/2022   Acute on  chronic systolic CHF (congestive heart failure) (HCC) 07/30/2022   Hypokalemia 07/30/2022   Hyperglycemia 07/30/2022   Elevated troponin 07/30/2022   Obesity    Hypertension    GERD (gastroesophageal reflux disease)    Hyperthyroidism    Acute coronary syndrome (HCC) 11/28/2019    REFERRING DIAG: M25.511 (ICD-10-CM) - Pain in right shoulder M75.41 (ICD-10-CM) - Impingement syndrome of right shoulder M19.011 (ICD-10-CM) - Primary osteoarthritis, right shoulder M77.8 (ICD-10-CM) - Other enthesopathies, not elsewhere classified   THERAPY DIAG:  Right shoulder pain, unspecified chronicity  Cervicalgia  Acute pain of left shoulder  Muscle weakness (generalized)  Rationale for Evaluation and Treatment Rehabilitation  PERTINENT HISTORY: R shoulder pain. MD wants pt to try PT first before getting the shots. R shoulder pain has been going on for about a month or so. Might be due to his bed. Pt was also walks his dog and his dog tugs on his shoulder which might have bothered it. Pain might have also been lifting water from his car. Pain is off and on, today it might be 9/10, tomorrow might be 5/10. Pt is R hand dominant. 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 Sees a heart specialist regularly, about every 3  weeks now. Main area of pain is anterior and lateral shoulder joint. Also has pain in the R lateral neck area.   PRECAUTIONS: Hx of Cardiomyopathy, CHF   SUBJECTIVE:   SUBJECTIVE STATEMENT: R shoulder is alright, a little stiff, the neck is about the same. Has been doing his HEP. 6/10 R lateral neck and shoulder pain currently. Got the sleep apnea test, and has it bad. Doing the stretches at the session helps. Does the stretches at home which help.    PAIN:  Are you having pain? 6/10 Rt shoulder, stiff    TODAY'S TREATMENT:                                                                                                                                          DATE: 04/21/23  Precautions: hx of CHF   Therapeutic exercise UBE level 1, 60sec antegrade, 60sec retrograde, 60sec antegrade, 60sec retrograde Seated R shoulder Pulley AAROM   Flexion 15x5 seconds   Scaption/ Abduction 15x5 seconds  Seated B shoulder ER yellow band 10x2 Standing Red TB neck scarf triceps extension 1x15 Standing Right shoulder ABDCT 0-90 degrees 1lb weight, reduced to 0lb Standing Right shoulder flexion 0-90 degrees, 0lb, unable to do >5 reps, heavy guarding, moved to AA/ROM  Standing Right shoulder ABDCT 0-60 degrees (cued pain-free pain range) 0lb 1x10 Standing Right shoulder ABDCT 0-60 degrees (cued pain-free pain range) 0lb 1x10  RUE myofascial release and moist heat x 5 minutes   Response to treatment *see Clinical impression     Clinical impression *see below     PATIENT EDUCATION: Education details: there-ex, HEP Person educated: Patient Education method: Explanation, Demonstration, Tactile cues, Verbal cues, and Handouts Education comprehension: verbalized understanding and returned demonstration  HOME EXERCISE PROGRAM: Access Code: [email or web address] URL: [email or web address] Date: 03/14/2023 Prepared by: Loralyn Freshwater  Exercises - Standing Shoulder External Rotation with Resistance  - 1 x daily - 7 x weekly - 3 sets - 10 reps  - Seated Upper Trapezius Stretch  - 3 x daily - 7 x weekly - 1 sets - 5 reps - 30 seconds  hold - Seated Scapular Retraction  - 1 x daily - 7 x weekly - 3 sets - 10 reps - 5 seconds hold - Isometric Tricep Extension   - 1 x daily - 7 x weekly - 3 sets - 10 reps - 5 seconds hold   Included scapular depression isometrics with the triceps exercise (04/14/2023)   To help decrease R upper trap and scalene muscle tension   - Seated Shoulder Flexion AAROM with Pulley Behind  - 1 x daily - 7 x weekly - 3 sets - 10 reps - 5 seconds hold - Seated Shoulder Abduction AAROM with Pulley Behind  - 1  x daily - 7 x weekly - 3 sets - 10 reps - 5 seconds hold      PT  Short Term Goals - 04/19/23 0739       PT SHORT TERM GOAL #1   Title Pt will be independent with his initial HEP to decrease pain, improve shoulder AROM, strength, function, and ability to reach more comfortably.    Baseline Does some of the HEP, not all. Helps when he does them. No questions (04/12/2023), no Questions (04/19/2023)    Time 3    Period Weeks    Status Achieved    Target Date 03/31/23              PT Long Term Goals - 04/21/23 0748       PT LONG TERM GOAL #1   Title Pt will have a decrease in R shoulder pain to 4/10 or less at worst to promote ability to reach up, reach behind, as well as don and doff clothes more comfortably.    Baseline 8-9/10 R shoulder joint pain at worst for the past month based on pt interview (03/09/2023); 7/10 R shoulder pain at most for the past 7 days (04/12/2023), 7/10 at most for the past 7 days (04/19/2023)    Time 8    Period Weeks    Status Partially Met    Target Date 05/05/23      PT LONG TERM GOAL #2   Title Pt will have a decrease in R lateral neck pain to 3/10 or less at worst to promote ability to reach, look around more comfortably.    Baseline 7-9/10 R lateral neck pain at worst for the past month (feels like throbbing)  based on patient interview (03/09/2023); 6-7/10 at most for the past 7 days (04/12/2023), 6/10 at most for the 7 days (04/19/2023)    Time 8    Period Weeks    Status Partially Met    Target Date 05/05/23      PT LONG TERM GOAL #3   Title Pt will improve his R shoulder FOTO score by at least 10 points as a demonstration of improved function.    Baseline FOTO 53 04/21/23 (initial);    Time 8    Period Weeks    Status On-going    Target Date 05/05/23      PT LONG TERM GOAL #4   Title Pt will improve R shoulder flexion and abduction AROM by at least 20 degrees to promote ability to reach up more comfortably.    Baseline R shoulder AROM: 82  degrees flexion with pain, 62 degrees abduction with pain (03/09/2023); 120 degrees, 82 abduction (04/12/2023); 108 degrees flexion, 70 degrees abduction (04/19/2023)    Time 8    Period Weeks    Status Partially Met    Target Date 05/05/23      PT LONG TERM GOAL #5   Title Pt will improve R shoulder functional IR to R thumb to T12 spinous process to promote ability to don and doff clothes more comfortably.    Baseline R shoulder functional IR: R thumb to sacrum (03/09/2023), (04/12/2023), (04/19/2023)    Time 8    Period Weeks    Status On-going    Target Date 05/05/23              Plan - 04/21/23 0735     Clinical Impression Statement Continued with POC. FOTO survey initiated. Stiffness continues to fluctuate throughout the day. P/ROM looks fairly close to baseline in sagittal plane, but frontal plane remains more restricted. Active ROM remains significantly more restricted. Will continue to benefit from  PT services to optimize progress toward goals of care.    Personal Factors and Comorbidities Age;Fitness;Time since onset of injury/illness/exacerbation;Comorbidity 3+    Comorbidities Arthritis, cardiomyopathy, chronic back pain, CHF, HTN, obesity    Examination-Activity Limitations Dressing;Bathing;Transfers;Bed Mobility;Hygiene/Grooming;Sleep;Lift;Caring for Others;Carry;Reach Overhead;Toileting    Stability/Clinical Decision Making Stable/Uncomplicated    Clinical Decision Making Low    Rehab Potential Fair    PT Frequency 2x / week    PT Duration 8 weeks    PT Treatment/Interventions Therapeutic activities;Therapeutic exercise;Neuromuscular re-education;Patient/family education;Manual techniques;Dry needling;Electrical Stimulation;Iontophoresis 4mg /ml Dexamethasone    PT Next Visit Plan posture, scapular and ER strengthening, improve glenohumeral mechanics, manual techniques, modalities PRN    PT Home Exercise Plan Med[employer] [email or web address]    Consulted and Agree with Plan of Care Patient               7:51 AM, 04/21/23 Rosamaria Lints, PT, DPT Physical Therapist - [place] [phone] (Office)

## 2023-04-26 ENCOUNTER — Ambulatory Visit: Payer: Medicare HMO

## 2023-04-26 ENCOUNTER — Other Ambulatory Visit: Payer: Self-pay | Admitting: Cardiovascular Disease

## 2023-04-26 DIAGNOSIS — M25511 Pain in right shoulder: Secondary | ICD-10-CM | POA: Diagnosis not present

## 2023-04-26 DIAGNOSIS — M542 Cervicalgia: Secondary | ICD-10-CM

## 2023-04-26 NOTE — Therapy (Signed)
OUTPATIENT PHYSICAL THERAPY TREATMENT NOTE    Patient Name: Ronald Horne MRN: [id number] DOB:September 06, 1951, 72 y.o., male Today's Date: 04/26/2023  PCP: Sherrie Mustache, MD  REFERRING PROVIDER: Jerrel Ivory, DO   END OF SESSION:  PT End of Session - 04/26/23 0732     Visit Number 12    Number of Visits 17    Date for PT Re-Evaluation 05/05/23    Authorization Type [employer] Primary; Medicaid [place] Secondary    Authorization Time Period Auth for 24 visits 03/06/23-05/31/23    Progress Note Due on Visit 20    PT Start Time 0732    PT Stop Time 0812    PT Time Calculation (min) 40 min    Activity Tolerance Patient tolerated treatment well;No increased pain    Behavior During Therapy [facility] for tasks assessed/performed                      Past Medical History:  Diagnosis Date   Arthritis    Cardiomyopathy (HCC)    a. 08/2016 Echo: EF 30-35%, diff HK; b. 03/2018 Ex MV ([facility]): Ex time 2:00, large anter defect, EF 24%; c. 03/2018 Echo: EF 20-25%, diff HK. Gr1 DD; d. Refused cath.   Chronic back pain    Chronic combined systolic (congestive) and diastolic (congestive) heart failure (HCC)    a. 08/2016 Echo: EF 30-35%, diff HK, mild to mod MR, mild BAE; b. 03/2018 Echo: EF 20-25%, diff HK. Gr1 DD. MIldly dil Ao root. Mild BAE. Mildly reduced RV fxn.   GERD (gastroesophageal reflux disease)    Gout    Hypertension    Obesity    Past Surgical History:  Procedure Laterality Date   EPIDURAL BLOCK INJECTION     chronic back pain    LEFT HEART CATH AND CORONARY ANGIOGRAPHY Left 12/27/2019   Procedure: LEFT HEART CATH AND CORONARY ANGIOGRAPHY;  Surgeon: Laurier Nancy, MD;  Location: [facility] INVASIVE CV LAB;  Service: Cardiovascular;  Laterality: Left;   TONSILLECTOMY     Patient Active Problem List   Diagnosis Date Noted   PAD (peripheral artery disease) (HCC) 09/02/2022   Acute respiratory failure with hypoxia (HCC) 07/31/2022   CHF exacerbation (HCC)  07/30/2022   Acute on chronic systolic CHF (congestive heart failure) (HCC) 07/30/2022   Hypokalemia 07/30/2022   Hyperglycemia 07/30/2022   Elevated troponin 07/30/2022   Obesity    Hypertension    GERD (gastroesophageal reflux disease)    Hyperthyroidism    Acute coronary syndrome (HCC) 11/28/2019    REFERRING DIAG: M25.511 (ICD-10-CM) - Pain in right shoulder M75.41 (ICD-10-CM) - Impingement syndrome of right shoulder M19.011 (ICD-10-CM) - Primary osteoarthritis, right shoulder M77.8 (ICD-10-CM) - Other enthesopathies, not elsewhere classified   THERAPY DIAG:  Right shoulder pain, unspecified chronicity  Cervicalgia  Rationale for Evaluation and Treatment Rehabilitation  PERTINENT HISTORY: R shoulder pain. MD wants pt to try PT first before getting the shots. R shoulder pain has been going on for about a month or so. Might be due to his bed. Pt was also walks his dog and his dog tugs on his shoulder which might have bothered it. Pain might have also been lifting water from his car. Pain is off and on, today it might be 9/10, tomorrow might be 5/10. Pt is R hand dominant. 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 Sees a heart specialist regularly, about every 3 weeks now.  Main area of pain is anterior and lateral shoulder joint. Also has pain in the R lateral neck area.   PRECAUTIONS: Hx of Cardiomyopathy, CHF   SUBJECTIVE:   SUBJECTIVE STATEMENT: R shoulder is stiff as a board     PAIN:  Are you having pain? No pain level reported.    TODAY'S TREATMENT:                                                                                                                                         DATE: 04/26/2023  Precautions: hx of CHF   Decreased R shoulder pain with flexion with scapular retraction    (+) empty can and Hawkins kennedy R shoulder   Demonstrates scapular compensation    No latex allergies   No known  osteoporosis per pt.    Therapeutic exercise  Standing R shoulder AROM  Reviewed progress/current status with PT towards goals.   Seated R shoulder Pulley AAROM   Flexion 10x5 seconds for 2 sets  Scaption/ Abduction 10x5 seconds for 2 sets    Cues for proper scapular retraction and depression.    UBE level 1, 60sec antegrade, 60sec retrograde, 60sec antegrade, 60sec retrograde  Seated R shoulder self inferior mob 10x3 with 5 second holds  Seated B shoulder ER yellow band 10x2, then 7x. Stopped secondary to fatigue.   With A to P pressure to R humeral head from PT   Seated R shoulder flexion AAROM with PT assist, emphasis on scapular positioning 5x5 seconds  Seated R shoulder IR isometrics, hand on abdomen 4x5 seconds   With A to P pressure to R humeral head from PT   Therapeutic rest breaks secondary to fatigue    Improved exercise technique, movement at target joints, use of target muscles after mod verbal, visual, tactile cues.    Response to treatment R shoulder feels loose, after session. Pt states feeling a whole lot better.      Clinical impression  Added self inferior R shoulder joint mobs and A to P pressure with ER and IR muscle activation to improve intraarticular joint mechanics ( to promote posterior and inferior glide of humeral head when raising arm). Decreased R shoulder stiffness reported after session. Pt will benefit from continued skilled physical therapy services to decrease pain, improve strength and function.      PATIENT EDUCATION: Education details: there-ex, HEP Person educated: Patient Education method: Explanation, Demonstration, Tactile cues, Verbal cues, and Handouts Education comprehension: verbalized understanding and returned demonstration  HOME EXERCISE PROGRAM: Access Code: [email or web address] URL: [email or web address] Date: 03/14/2023 Prepared by: Loralyn Freshwater  Exercises - Standing Shoulder External Rotation with  Resistance  - 1 x daily - 7 x weekly - 3 sets - 10 reps  - Seated Upper Trapezius Stretch  - 3 x daily - 7 x weekly - 1 sets - 5 reps - 30 seconds  hold - Seated  Scapular Retraction  - 1 x daily - 7 x weekly - 3 sets - 10 reps - 5 seconds hold - Isometric Tricep Extension   - 1 x daily - 7 x weekly - 3 sets - 10 reps - 5 seconds hold  Included scapular depression isometrics with the triceps exercise (04/14/2023)   To help decrease R upper trap and scalene muscle tension   - Seated Shoulder Flexion AAROM with Pulley Behind  - 1 x daily - 7 x weekly - 3 sets - 10 reps - 5 seconds hold - Seated Shoulder Abduction AAROM with Pulley Behind  - 1 x daily - 7 x weekly - 3 sets - 10 reps - 5 seconds hold - Seated Shoulder Inferior Glide  - 1 x daily - 7 x weekly - 3 sets - 10 reps - 5 seconds hold        PT Short Term Goals - 04/19/23 0739       PT SHORT TERM GOAL #1   Title Pt will be independent with his initial HEP to decrease pain, improve shoulder AROM, strength, function, and ability to reach more comfortably.    Baseline Does some of the HEP, not all. Helps when he does them. No questions (04/12/2023), no Questions (04/19/2023)    Time 3    Period Weeks    Status Achieved    Target Date 03/31/23              PT Long Term Goals - 04/21/23 0748       PT LONG TERM GOAL #1   Title Pt will have a decrease in R shoulder pain to 4/10 or less at worst to promote ability to reach up, reach behind, as well as don and doff clothes more comfortably.    Baseline 8-9/10 R shoulder joint pain at worst for the past month based on pt interview (03/09/2023); 7/10 R shoulder pain at most for the past 7 days (04/12/2023), 7/10 at most for the past 7 days (04/19/2023)    Time 8    Period Weeks    Status Partially Met    Target Date 05/05/23      PT LONG TERM GOAL #2   Title Pt will have a decrease in R lateral neck pain to 3/10 or less at worst to promote ability to reach, look around more  comfortably.    Baseline 7-9/10 R lateral neck pain at worst for the past month (feels like throbbing)  based on patient interview (03/09/2023); 6-7/10 at most for the past 7 days (04/12/2023), 6/10 at most for the 7 days (04/19/2023)    Time 8    Period Weeks    Status Partially Met    Target Date 05/05/23      PT LONG TERM GOAL #3   Title Pt will improve his R shoulder FOTO score by at least 10 points as a demonstration of improved function.    Baseline FOTO 53 04/21/23 (initial);    Time 8    Period Weeks    Status On-going    Target Date 05/05/23      PT LONG TERM GOAL #4   Title Pt will improve R shoulder flexion and abduction AROM by at least 20 degrees to promote ability to reach up more comfortably.    Baseline R shoulder AROM: 82 degrees flexion with pain, 62 degrees abduction with pain (03/09/2023); 120 degrees, 82 abduction (04/12/2023); 108 degrees flexion, 70 degrees abduction (04/19/2023)  Time 8    Period Weeks    Status Partially Met    Target Date 05/05/23      PT LONG TERM GOAL #5   Title Pt will improve R shoulder functional IR to R thumb to T12 spinous process to promote ability to don and doff clothes more comfortably.    Baseline R shoulder functional IR: R thumb to sacrum (03/09/2023), (04/12/2023), (04/19/2023)    Time 8    Period Weeks    Status On-going    Target Date 05/05/23              Plan - 04/26/23 0731     Clinical Impression Statement Added self inferior R shoulder joint mobs and A to P pressure with ER and IR muscle activation to improve intraarticular joint mechanics ( to promote posterior and inferior glide of humeral head when raising arm). Decreased R shoulder stiffness reported after session. Pt will benefit from continued skilled physical therapy services to decrease pain, improve strength and function.    Personal Factors and Comorbidities Age;Fitness;Time since onset of injury/illness/exacerbation;Comorbidity 3+    Comorbidities Arthritis,  cardiomyopathy, chronic back pain, CHF, HTN, obesity    Examination-Activity Limitations Dressing;Bathing;Transfers;Bed Mobility;Hygiene/Grooming;Sleep;Lift;Caring for Others;Carry;Reach Overhead;Toileting    Stability/Clinical Decision Making Stable/Uncomplicated    Rehab Potential Fair    PT Frequency 2x / week    PT Duration 8 weeks    PT Treatment/Interventions Therapeutic activities;Therapeutic exercise;Neuromuscular re-education;Patient/family education;Manual techniques;Dry needling;Electrical Stimulation;Iontophoresis 4mg /ml Dexamethasone    PT Next Visit Plan posture, scapular and ER strengthening, improve glenohumeral mechanics, manual techniques, modalities PRN    PT Home Exercise Plan Med[facility] [email or web address]    Consulted and Agree with Plan of Care Patient               Loralyn Freshwater PT, DPT   04/26/2023, 9:40 AM

## 2023-04-28 ENCOUNTER — Ambulatory Visit: Payer: Medicare HMO

## 2023-04-28 DIAGNOSIS — M542 Cervicalgia: Secondary | ICD-10-CM

## 2023-04-28 DIAGNOSIS — M25511 Pain in right shoulder: Secondary | ICD-10-CM | POA: Diagnosis not present

## 2023-04-28 NOTE — Therapy (Signed)
OUTPATIENT PHYSICAL THERAPY TREATMENT NOTE    Patient Name: Ronald Horne MRN: [id number] DOB:Dec 10, 1950, 72 y.o., male Today's Date: 04/28/2023  PCP: Sherrie Mustache, MD  REFERRING PROVIDER: Jerrel Ivory, DO   END OF SESSION:  PT End of Session - 04/28/23 0739     Visit Number 13    Number of Visits 17    Date for PT Re-Evaluation 05/05/23    Authorization Type [employer] Primary; Medicaid [place] Secondary    Authorization Time Period Auth for 24 visits 03/06/23-05/31/23    Progress Note Due on Visit 20    PT Start Time 0740   pt arrived late   PT Stop Time 0812    PT Time Calculation (min) 32 min    Activity Tolerance Patient tolerated treatment well;No increased pain    Behavior During Therapy [facility] for tasks assessed/performed                       Past Medical History:  Diagnosis Date   Arthritis    Cardiomyopathy (HCC)    a. 08/2016 Echo: EF 30-35%, diff HK; b. 03/2018 Ex MV ([facility]): Ex time 2:00, large anter defect, EF 24%; c. 03/2018 Echo: EF 20-25%, diff HK. Gr1 DD; d. Refused cath.   Chronic back pain    Chronic combined systolic (congestive) and diastolic (congestive) heart failure (HCC)    a. 08/2016 Echo: EF 30-35%, diff HK, mild to mod MR, mild BAE; b. 03/2018 Echo: EF 20-25%, diff HK. Gr1 DD. MIldly dil Ao root. Mild BAE. Mildly reduced RV fxn.   GERD (gastroesophageal reflux disease)    Gout    Hypertension    Obesity    Past Surgical History:  Procedure Laterality Date   EPIDURAL BLOCK INJECTION     chronic back pain    LEFT HEART CATH AND CORONARY ANGIOGRAPHY Left 12/27/2019   Procedure: LEFT HEART CATH AND CORONARY ANGIOGRAPHY;  Surgeon: Laurier Nancy, MD;  Location: [facility] INVASIVE CV LAB;  Service: Cardiovascular;  Laterality: Left;   TONSILLECTOMY     Patient Active Problem List   Diagnosis Date Noted   PAD (peripheral artery disease) (HCC) 09/02/2022   Acute respiratory failure with hypoxia (HCC) 07/31/2022   CHF  exacerbation (HCC) 07/30/2022   Acute on chronic systolic CHF (congestive heart failure) (HCC) 07/30/2022   Hypokalemia 07/30/2022   Hyperglycemia 07/30/2022   Elevated troponin 07/30/2022   Obesity    Hypertension    GERD (gastroesophageal reflux disease)    Hyperthyroidism    Acute coronary syndrome (HCC) 11/28/2019    REFERRING DIAG: M25.511 (ICD-10-CM) - Pain in right shoulder M75.41 (ICD-10-CM) - Impingement syndrome of right shoulder M19.011 (ICD-10-CM) - Primary osteoarthritis, right shoulder M77.8 (ICD-10-CM) - Other enthesopathies, not elsewhere classified   THERAPY DIAG:  Right shoulder pain, unspecified chronicity  Cervicalgia  Rationale for Evaluation and Treatment Rehabilitation  PERTINENT HISTORY: R shoulder pain. MD wants pt to try PT first before getting the shots. R shoulder pain has been going on for about a month or so. Might be due to his bed. Pt was also walks his dog and his dog tugs on his shoulder which might have bothered it. Pain might have also been lifting water from his car. Pain is off and on, today it might be 9/10, tomorrow might be 5/10. Pt is R hand dominant. 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 Sees a heart specialist regularly,  about every 3 weeks now. Main area of pain is anterior and lateral shoulder joint. Also has pain in the R lateral neck area.   PRECAUTIONS: Hx of Cardiomyopathy, CHF   SUBJECTIVE:   SUBJECTIVE STATEMENT: R shoulder is alright. About a 5-6/10 currently     PAIN:  Are you having pain? No pain level reported.    TODAY'S TREATMENT:                                                                                                                                         DATE: 04/28/2023  Precautions: hx of CHF   Decreased R shoulder pain with flexion with scapular retraction    (+) empty can and Hawkins kennedy R shoulder   Demonstrates scapular compensation     No latex allergies   No known osteoporosis per pt.    Therapeutic exercise  Blood pressure L arm sitting, mechanically taken, normal cuff: 105/67, HR 89 No light headedness or dizziness.   Seated R shoulder self inferior mob 10x2 with 5 second holds  Seated B shoulder ER yellow band 10x2  With A to P pressure to R humeral head from PT  Seated R shoulder IR isometrics, hand on abdomen 5x5 seconds for 2 sets  With A to P pressure to R humeral head from PT  Seated R shoulder flexion AAROM with PT assist, emphasis on scapular positioning 5x5 seconds  R upper extremity shaking at end range.   Pt reports not eating breakfast. Used to have diabetes but not anymore. Provided pt with mandarin orange fruit cup for energy. Pt states no food allergies.  Time taken for pt to eat fruit cup.    Again: seated R shoulder flexion AAROM with PT assist, emphasis on scapular positioning 5x5 seconds for 2 sets  Better able to perform after eating. Minimal to no R UE shaking observed and felt afterwards.      Therapeutic rest breaks secondary to fatigue    Improved exercise technique, movement at target joints, use of target muscles after mod verbal, visual, tactile cues.    Response to treatment R shoulder feels better after session reported.      Clinical impression  Pt arrived late so session was adjusted accordingly. Continued with self inferior R shoulder joint mobs and A to P pressure with ER and IR muscle activation to improve intraarticular joint mechanics ( to promote posterior and inferior glide of humeral head when raising arm). R shoulder felt better after session reported. Pt will benefit from continued skilled physical therapy services to decrease pain, improve strength and function.      PATIENT EDUCATION: Education details: there-ex, HEP Person educated: Patient Education method: Explanation, Demonstration, Tactile cues, Verbal cues, and Handouts Education  comprehension: verbalized understanding and returned demonstration  HOME EXERCISE PROGRAM: Access Code: [email or web address] URL: [email or web address] Date: 03/14/2023 Prepared by: Loralyn Freshwater  Exercises - Standing  Shoulder External Rotation with Resistance  - 1 x daily - 7 x weekly - 3 sets - 10 reps  - Seated Upper Trapezius Stretch  - 3 x daily - 7 x weekly - 1 sets - 5 reps - 30 seconds  hold - Seated Scapular Retraction  - 1 x daily - 7 x weekly - 3 sets - 10 reps - 5 seconds hold - Isometric Tricep Extension   - 1 x daily - 7 x weekly - 3 sets - 10 reps - 5 seconds hold  Included scapular depression isometrics with the triceps exercise (04/14/2023)   To help decrease R upper trap and scalene muscle tension   - Seated Shoulder Flexion AAROM with Pulley Behind  - 1 x daily - 7 x weekly - 3 sets - 10 reps - 5 seconds hold - Seated Shoulder Abduction AAROM with Pulley Behind  - 1 x daily - 7 x weekly - 3 sets - 10 reps - 5 seconds hold - Seated Shoulder Inferior Glide  - 1 x daily - 7 x weekly - 3 sets - 10 reps - 5 seconds hold        PT Short Term Goals - 04/19/23 0739       PT SHORT TERM GOAL #1   Title Pt will be independent with his initial HEP to decrease pain, improve shoulder AROM, strength, function, and ability to reach more comfortably.    Baseline Does some of the HEP, not all. Helps when he does them. No questions (04/12/2023), no Questions (04/19/2023)    Time 3    Period Weeks    Status Achieved    Target Date 03/31/23              PT Long Term Goals - 04/21/23 0748       PT LONG TERM GOAL #1   Title Pt will have a decrease in R shoulder pain to 4/10 or less at worst to promote ability to reach up, reach behind, as well as don and doff clothes more comfortably.    Baseline 8-9/10 R shoulder joint pain at worst for the past month based on pt interview (03/09/2023); 7/10 R shoulder pain at most for the past 7 days (04/12/2023), 7/10 at most for the past 7  days (04/19/2023)    Time 8    Period Weeks    Status Partially Met    Target Date 05/05/23      PT LONG TERM GOAL #2   Title Pt will have a decrease in R lateral neck pain to 3/10 or less at worst to promote ability to reach, look around more comfortably.    Baseline 7-9/10 R lateral neck pain at worst for the past month (feels like throbbing)  based on patient interview (03/09/2023); 6-7/10 at most for the past 7 days (04/12/2023), 6/10 at most for the 7 days (04/19/2023)    Time 8    Period Weeks    Status Partially Met    Target Date 05/05/23      PT LONG TERM GOAL #3   Title Pt will improve his R shoulder FOTO score by at least 10 points as a demonstration of improved function.    Baseline FOTO 53 04/21/23 (initial);    Time 8    Period Weeks    Status On-going    Target Date 05/05/23      PT LONG TERM GOAL #4   Title Pt will improve R shoulder flexion and  abduction AROM by at least 20 degrees to promote ability to reach up more comfortably.    Baseline R shoulder AROM: 82 degrees flexion with pain, 62 degrees abduction with pain (03/09/2023); 120 degrees, 82 abduction (04/12/2023); 108 degrees flexion, 70 degrees abduction (04/19/2023)    Time 8    Period Weeks    Status Partially Met    Target Date 05/05/23      PT LONG TERM GOAL #5   Title Pt will improve R shoulder functional IR to R thumb to T12 spinous process to promote ability to don and doff clothes more comfortably.    Baseline R shoulder functional IR: R thumb to sacrum (03/09/2023), (04/12/2023), (04/19/2023)    Time 8    Period Weeks    Status On-going    Target Date 05/05/23              Plan - 04/28/23 0739     Clinical Impression Statement Pt arrived late so session was adjusted accordingly. Continued with self inferior R shoulder joint mobs and A to P pressure with ER and IR muscle activation to improve intraarticular joint mechanics ( to promote posterior and inferior glide of humeral head when raising arm). R  shoulder felt better after session reported. Pt will benefit from continued skilled physical therapy services to decrease pain, improve strength and function.    Personal Factors and Comorbidities Age;Fitness;Time since onset of injury/illness/exacerbation;Comorbidity 3+    Comorbidities Arthritis, cardiomyopathy, chronic back pain, CHF, HTN, obesity    Examination-Activity Limitations Dressing;Bathing;Transfers;Bed Mobility;Hygiene/Grooming;Sleep;Lift;Caring for Others;Carry;Reach Overhead;Toileting    Stability/Clinical Decision Making Stable/Uncomplicated    Rehab Potential Fair    PT Frequency 2x / week    PT Duration 8 weeks    PT Treatment/Interventions Therapeutic activities;Therapeutic exercise;Neuromuscular re-education;Patient/family education;Manual techniques;Dry needling;Electrical Stimulation;Iontophoresis 4mg /ml Dexamethasone    PT Next Visit Plan posture, scapular and ER strengthening, improve glenohumeral mechanics, manual techniques, modalities PRN    PT Home Exercise Plan [facility] [email or web address]    Consulted and Agree with Plan of Care Patient               Loralyn Freshwater PT, DPT   04/28/2023, 9:19 AM

## 2023-05-01 ENCOUNTER — Ambulatory Visit: Payer: Medicare HMO | Attending: Sports Medicine

## 2023-05-01 DIAGNOSIS — M25511 Pain in right shoulder: Secondary | ICD-10-CM | POA: Diagnosis present

## 2023-05-01 DIAGNOSIS — M542 Cervicalgia: Secondary | ICD-10-CM | POA: Insufficient documentation

## 2023-05-01 NOTE — Therapy (Signed)
OUTPATIENT PHYSICAL THERAPY TREATMENT NOTE    Patient Name: Ronald Horne MRN: [id number] DOB:[birth year], 72 y.o., male Today's Date: 05/01/2023  PCP: Sherrie Mustache, MD  REFERRING PROVIDER: Jerrel Ivory, DO   END OF SESSION:  PT End of Session - 05/01/23 0732     Visit Number 14    Number of Visits 17    Date for PT Re-Evaluation 05/05/23    Authorization Type [employer] Primary; Medicaid [place] Secondary    Authorization Time Period Auth for 24 visits 03/06/23-05/31/23    Progress Note Due on Visit 20    PT Start Time 0732    PT Stop Time 0815    PT Time Calculation (min) 43 min    Activity Tolerance Patient tolerated treatment well;No increased pain    Behavior During Therapy [facility] for tasks assessed/performed                        Past Medical History:  Diagnosis Date   Arthritis    Cardiomyopathy (HCC)    a. 08/2016 Echo: EF 30-35%, diff HK; b. 03/2018 Ex MV ([facility]): Ex time 2:00, large anter defect, EF 24%; c. 03/2018 Echo: EF 20-25%, diff HK. Gr1 DD; d. Refused cath.   Chronic back pain    Chronic combined systolic (congestive) and diastolic (congestive) heart failure (HCC)    a. 08/2016 Echo: EF 30-35%, diff HK, mild to mod MR, mild BAE; b. 03/2018 Echo: EF 20-25%, diff HK. Gr1 DD. MIldly dil Ao root. Mild BAE. Mildly reduced RV fxn.   GERD (gastroesophageal reflux disease)    Gout    Hypertension    Obesity    Past Surgical History:  Procedure Laterality Date   EPIDURAL BLOCK INJECTION     chronic back pain    LEFT HEART CATH AND CORONARY ANGIOGRAPHY Left 12/27/2019   Procedure: LEFT HEART CATH AND CORONARY ANGIOGRAPHY;  Surgeon: Laurier Nancy, MD;  Location: [facility] INVASIVE CV LAB;  Service: Cardiovascular;  Laterality: Left;   TONSILLECTOMY     Patient Active Problem List   Diagnosis Date Noted   PAD (peripheral artery disease) (HCC) 09/02/2022   Acute respiratory failure with hypoxia (HCC) 07/31/2022   CHF exacerbation (HCC)  07/30/2022   Acute on chronic systolic CHF (congestive heart failure) (HCC) 07/30/2022   Hypokalemia 07/30/2022   Hyperglycemia 07/30/2022   Elevated troponin 07/30/2022   Obesity    Hypertension    GERD (gastroesophageal reflux disease)    Hyperthyroidism    Acute coronary syndrome (HCC) 11/28/2019    REFERRING DIAG: M25.511 (ICD-10-CM) - Pain in right shoulder M75.41 (ICD-10-CM) - Impingement syndrome of right shoulder M19.011 (ICD-10-CM) - Primary osteoarthritis, right shoulder M77.8 (ICD-10-CM) - Other enthesopathies, not elsewhere classified   THERAPY DIAG:  Right shoulder pain, unspecified chronicity  Cervicalgia  Rationale for Evaluation and Treatment Rehabilitation  PERTINENT HISTORY: R shoulder pain. MD wants pt to try PT first before getting the shots. R shoulder pain has been going on for about a month or so. Might be due to his bed. Pt was also walks his dog and his dog tugs on his shoulder which might have bothered it. Pain might have also been lifting water from his car. Pain is off and on, today it might be 9/10, tomorrow might be 5/10. Pt is R hand dominant. 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 Sees a heart specialist regularly, about every 3  weeks now. Main area of pain is anterior and lateral shoulder joint. Also has pain in the R lateral neck area.   PRECAUTIONS: Hx of Cardiomyopathy, CHF   SUBJECTIVE:   SUBJECTIVE STATEMENT: R shoulder is alright. 5-6/10 currently.      PAIN:  Are you having pain? No pain level reported.    TODAY'S TREATMENT:                                                                                                                                         DATE: 05/01/2023  Precautions: hx of CHF   Decreased R shoulder pain with flexion with scapular retraction    (+) empty can and Hawkins kennedy R shoulder   Demonstrates scapular compensation    No latex  allergies   No known osteoporosis per pt.    Therapeutic exercise  R shoulder AROM   Flexion: 112 degrees  Abduction: 70 degrees  Standing B shoulder ER yellow band 9x5 seconds,   then seated 5x5 seconds for 2 sets  Added 5 second holds today.   Seated R triceps and shoulder extension isometrics, hand and arm on the table 10x5 seconds for 3 sets  Seated R shoulder self inferior mob 10x2 with 5 second holds  Seated R shoulder flexion AAROM with PT assist, emphasis on scapular positioning 5x5 seconds for 2 sets  Seated R shoulder IR isometrics, hand on abdomen 5x5 seconds for 2 sets  With A to P pressure to R humeral head from PT  Reviewed POC with pt. Possible DC with continuation with HEP after next visit.     Therapeutic rest breaks secondary to fatigue    Improved exercise technique, movement at target joints, use of target muscles after mod verbal, visual, tactile cues.    Response to treatment 112 degrees R shoulder flexion but abduction improved to 82 degrees AROM after session        Clinical impression Continued working on rotator cuff strengthening, improving intraarticular movement,  and AAROM to promote ability to raise his arm up with less difficulty. Pt tolerated session well without aggravation of symptoms. Improved R shoulder abduction AROM to 82 degrees after session. Pt will benefit from continued skilled physical therapy services to decrease pain, improve strength and function.      PATIENT EDUCATION: Education details: there-ex, HEP Person educated: Patient Education method: Explanation, Demonstration, Tactile cues, Verbal cues, and Handouts Education comprehension: verbalized understanding and returned demonstration  HOME EXERCISE PROGRAM: Access Code: [email or web address] URL: [email or web address] Date: 03/14/2023 Prepared by: Loralyn Freshwater  Exercises - Standing Shoulder External Rotation with Resistance  - 1 x daily - 7 x weekly - 3  sets - 10 reps  - Seated Upper Trapezius Stretch  - 3 x daily - 7 x weekly - 1 sets - 5 reps - 30 seconds  hold - Seated Scapular Retraction  - 1 x daily -  7 x weekly - 3 sets - 10 reps - 5 seconds hold - Isometric Tricep Extension   - 1 x daily - 7 x weekly - 3 sets - 10 reps - 5 seconds hold  Included scapular depression isometrics with the triceps exercise (04/14/2023)   To help decrease R upper trap and scalene muscle tension   - Seated Shoulder Flexion AAROM with Pulley Behind  - 1 x daily - 7 x weekly - 3 sets - 10 reps - 5 seconds hold - Seated Shoulder Abduction AAROM with Pulley Behind  - 1 x daily - 7 x weekly - 3 sets - 10 reps - 5 seconds hold - Seated Shoulder Inferior Glide  - 1 x daily - 7 x weekly - 3 sets - 10 reps - 5 seconds hold        PT Short Term Goals - 04/19/23 0739       PT SHORT TERM GOAL #1   Title Pt will be independent with his initial HEP to decrease pain, improve shoulder AROM, strength, function, and ability to reach more comfortably.    Baseline Does some of the HEP, not all. Helps when he does them. No questions (04/12/2023), no Questions (04/19/2023)    Time 3    Period Weeks    Status Achieved    Target Date 03/31/23              PT Long Term Goals - 04/21/23 0748       PT LONG TERM GOAL #1   Title Pt will have a decrease in R shoulder pain to 4/10 or less at worst to promote ability to reach up, reach behind, as well as don and doff clothes more comfortably.    Baseline 8-9/10 R shoulder joint pain at worst for the past month based on pt interview (03/09/2023); 7/10 R shoulder pain at most for the past 7 days (04/12/2023), 7/10 at most for the past 7 days (04/19/2023)    Time 8    Period Weeks    Status Partially Met    Target Date 05/05/23      PT LONG TERM GOAL #2   Title Pt will have a decrease in R lateral neck pain to 3/10 or less at worst to promote ability to reach, look around more comfortably.    Baseline 7-9/10 R lateral neck  pain at worst for the past month (feels like throbbing)  based on patient interview (03/09/2023); 6-7/10 at most for the past 7 days (04/12/2023), 6/10 at most for the 7 days (04/19/2023)    Time 8    Period Weeks    Status Partially Met    Target Date 05/05/23      PT LONG TERM GOAL #3   Title Pt will improve his R shoulder FOTO score by at least 10 points as a demonstration of improved function.    Baseline FOTO 53 04/21/23 (initial);    Time 8    Period Weeks    Status On-going    Target Date 05/05/23      PT LONG TERM GOAL #4   Title Pt will improve R shoulder flexion and abduction AROM by at least 20 degrees to promote ability to reach up more comfortably.    Baseline R shoulder AROM: 82 degrees flexion with pain, 62 degrees abduction with pain (03/09/2023); 120 degrees, 82 abduction (04/12/2023); 108 degrees flexion, 70 degrees abduction (04/19/2023)    Time 8    Period Weeks  Status Partially Met    Target Date 05/05/23      PT LONG TERM GOAL #5   Title Pt will improve R shoulder functional IR to R thumb to T12 spinous process to promote ability to don and doff clothes more comfortably.    Baseline R shoulder functional IR: R thumb to sacrum (03/09/2023), (04/12/2023), (04/19/2023)    Time 8    Period Weeks    Status On-going    Target Date 05/05/23              Plan - 05/01/23 0731     Clinical Impression Statement Continued working on rotator cuff strengthening, improving intraarticular movement,  and AAROM to promote ability to raise his arm up with less difficulty. Pt tolerated session well without aggravation of symptoms. Improved R shoulder abduction AROM to 82 degrees after session. Pt will benefit from continued skilled physical therapy services to decrease pain, improve strength and function.    Personal Factors and Comorbidities Age;Fitness;Time since onset of injury/illness/exacerbation;Comorbidity 3+    Comorbidities Arthritis, cardiomyopathy, chronic back pain, CHF,  HTN, obesity    Examination-Activity Limitations Dressing;Bathing;Transfers;Bed Mobility;Hygiene/Grooming;Sleep;Lift;Caring for Others;Carry;Reach Overhead;Toileting    Stability/Clinical Decision Making Stable/Uncomplicated    Rehab Potential Fair    PT Frequency 2x / week    PT Duration 8 weeks    PT Treatment/Interventions Therapeutic activities;Therapeutic exercise;Neuromuscular re-education;Patient/family education;Manual techniques;Dry needling;Electrical Stimulation;Iontophoresis 4mg /ml Dexamethasone    PT Next Visit Plan posture, scapular and ER strengthening, improve glenohumeral mechanics, manual techniques, modalities PRN    PT Home Exercise Plan Medbridge [email or web address]    Consulted and Agree with Plan of Care Patient               Loralyn Freshwater PT, DPT   05/01/2023, 8:29 AM

## 2023-05-03 ENCOUNTER — Ambulatory Visit: Payer: Medicare HMO

## 2023-05-03 ENCOUNTER — Telehealth: Payer: Self-pay

## 2023-05-03 NOTE — Telephone Encounter (Signed)
No show. Called patient. Return phone call requested. Phone number [phone] provided.

## 2023-05-10 ENCOUNTER — Ambulatory Visit: Payer: Medicare HMO

## 2023-05-11 ENCOUNTER — Telehealth: Payer: Self-pay

## 2023-05-11 NOTE — Telephone Encounter (Signed)
Patient was a no show to his appt on 05/10/23.  I called patient and spoke with him and he said he had no way to get there. I reminded him to please call to let us know and reminded him of the attendance policy.  Patient stated he still wanted to come so reminded him of his appt on Wednesday 7/17 @ 730. Reminded patient that if he no shows that all future appts will be removed and scheduled on a 1 time basis. Patient voiced understanding.  Rosana Fret

## 2023-05-12 ENCOUNTER — Ambulatory Visit: Payer: Medicare HMO

## 2023-05-17 ENCOUNTER — Ambulatory Visit: Payer: Medicare HMO

## 2023-05-19 ENCOUNTER — Other Ambulatory Visit: Payer: Self-pay

## 2023-05-19 ENCOUNTER — Encounter: Payer: Self-pay | Admitting: Emergency Medicine

## 2023-05-19 ENCOUNTER — Inpatient Hospital Stay
Admission: EM | Admit: 2023-05-19 | Discharge: 2023-05-19 | DRG: 291 | Discharge status: Against Medical Advice | Payer: Medicare HMO | Attending: Family Medicine | Admitting: Family Medicine

## 2023-05-19 ENCOUNTER — Emergency Department: Payer: Medicare HMO

## 2023-05-19 DIAGNOSIS — I10 Essential (primary) hypertension: Secondary | ICD-10-CM

## 2023-05-19 DIAGNOSIS — I429 Cardiomyopathy, unspecified: Secondary | ICD-10-CM | POA: Diagnosis present

## 2023-05-19 DIAGNOSIS — I5023 Acute on chronic systolic (congestive) heart failure: Secondary | ICD-10-CM | POA: Diagnosis present

## 2023-05-19 DIAGNOSIS — Z79899 Other long term (current) drug therapy: Secondary | ICD-10-CM | POA: Diagnosis not present

## 2023-05-19 DIAGNOSIS — Z87891 Personal history of nicotine dependence: Secondary | ICD-10-CM

## 2023-05-19 DIAGNOSIS — I110 Hypertensive heart disease with heart failure: Secondary | ICD-10-CM | POA: Diagnosis not present

## 2023-05-19 DIAGNOSIS — G8929 Other chronic pain: Secondary | ICD-10-CM | POA: Diagnosis present

## 2023-05-19 DIAGNOSIS — Z1152 Encounter for screening for COVID-19: Secondary | ICD-10-CM

## 2023-05-19 DIAGNOSIS — M549 Dorsalgia, unspecified: Secondary | ICD-10-CM | POA: Diagnosis present

## 2023-05-19 DIAGNOSIS — Z7982 Long term (current) use of aspirin: Secondary | ICD-10-CM | POA: Diagnosis not present

## 2023-05-19 DIAGNOSIS — K219 Gastro-esophageal reflux disease without esophagitis: Secondary | ICD-10-CM

## 2023-05-19 DIAGNOSIS — E6609 Other obesity due to excess calories: Secondary | ICD-10-CM

## 2023-05-19 DIAGNOSIS — I5021 Acute systolic (congestive) heart failure: Secondary | ICD-10-CM | POA: Diagnosis present

## 2023-05-19 DIAGNOSIS — I5031 Acute diastolic (congestive) heart failure: Secondary | ICD-10-CM

## 2023-05-19 DIAGNOSIS — J9601 Acute respiratory failure with hypoxia: Secondary | ICD-10-CM | POA: Diagnosis present

## 2023-05-19 DIAGNOSIS — E876 Hypokalemia: Secondary | ICD-10-CM | POA: Diagnosis present

## 2023-05-19 DIAGNOSIS — I739 Peripheral vascular disease, unspecified: Secondary | ICD-10-CM

## 2023-05-19 DIAGNOSIS — R7989 Other specified abnormal findings of blood chemistry: Secondary | ICD-10-CM | POA: Diagnosis not present

## 2023-05-19 DIAGNOSIS — I509 Heart failure, unspecified: Secondary | ICD-10-CM

## 2023-05-19 LAB — CBC WITH DIFFERENTIAL/PLATELET
Abs Immature Granulocytes: 0.02 10*3/uL (ref 0.00–0.07)
Basophils Absolute: 0 10*3/uL (ref 0.0–0.1)
Basophils Relative: 1 %
Eosinophils Absolute: 0.1 10*3/uL (ref 0.0–0.5)
Eosinophils Relative: 1 %
HCT: 46.6 % (ref 39.0–52.0)
Hemoglobin: 15.6 g/dL (ref 13.0–17.0)
Immature Granulocytes: 0 %
Lymphocytes Relative: 30 %
Lymphs Abs: 2.2 10*3/uL (ref 0.7–4.0)
MCH: 29.9 pg (ref 26.0–34.0)
MCHC: 33.5 g/dL (ref 30.0–36.0)
MCV: 89.3 fL (ref 80.0–100.0)
Monocytes Absolute: 0.5 10*3/uL (ref 0.1–1.0)
Monocytes Relative: 6 %
Neutro Abs: 4.6 10*3/uL (ref 1.7–7.7)
Neutrophils Relative %: 62 %
Platelets: 215 10*3/uL (ref 150–400)
RBC: 5.22 MIL/uL (ref 4.22–5.81)
RDW: 14 % (ref 11.5–15.5)
WBC: 7.4 10*3/uL (ref 4.0–10.5)
nRBC: 0 % (ref 0.0–0.2)

## 2023-05-19 LAB — BASIC METABOLIC PANEL
Anion gap: 8 (ref 5–15)
BUN: 20 mg/dL (ref 8–23)
CO2: 21 mmol/L — ABNORMAL LOW (ref 22–32)
Calcium: 8.4 mg/dL — ABNORMAL LOW (ref 8.9–10.3)
Chloride: 110 mmol/L (ref 98–111)
Creatinine, Ser: 1 mg/dL (ref 0.61–1.24)
GFR, Estimated: >60 mL/min (ref >60)
Glucose, Bld: 126 mg/dL — ABNORMAL HIGH (ref 70–99)
Potassium: 3.6 mmol/L (ref 3.5–5.1)
Sodium: 139 mmol/L (ref 135–145)

## 2023-05-19 LAB — COMPREHENSIVE METABOLIC PANEL
ALT: 15 U/L (ref 0–44)
AST: 22 U/L (ref 15–41)
Albumin: 3.8 g/dL (ref 3.5–5.0)
Alkaline Phosphatase: 59 U/L (ref 38–126)
Anion gap: 8 (ref 5–15)
BUN: 13 mg/dL (ref 8–23)
CO2: 20 mmol/L — ABNORMAL LOW (ref 22–32)
Calcium: 8 mg/dL — ABNORMAL LOW (ref 8.9–10.3)
Chloride: 111 mmol/L (ref 98–111)
Creatinine, Ser: 1 mg/dL (ref 0.61–1.24)
GFR, Estimated: >60 mL/min (ref >60)
Glucose, Bld: 152 mg/dL — ABNORMAL HIGH (ref 70–99)
Potassium: 3.1 mmol/L — ABNORMAL LOW (ref 3.5–5.1)
Sodium: 139 mmol/L (ref 135–145)
Total Bilirubin: 0.5 mg/dL (ref 0.3–1.2)
Total Protein: 7.2 g/dL (ref 6.5–8.1)

## 2023-05-19 LAB — RESP PANEL BY RT-PCR (RSV, FLU A&B, COVID)  RVPGX2
Influenza A by PCR: NEGATIVE
Influenza B by PCR: NEGATIVE
Resp Syncytial Virus by PCR: NEGATIVE
SARS Coronavirus 2 by RT PCR: NEGATIVE

## 2023-05-19 LAB — TROPONIN I (HIGH SENSITIVITY)
Troponin I (High Sensitivity): 39 ng/L — ABNORMAL HIGH (ref <18)
Troponin I (High Sensitivity): 45 ng/L — ABNORMAL HIGH (ref <18)

## 2023-05-19 LAB — MAGNESIUM: Magnesium: 2.3 mg/dL (ref 1.7–2.4)

## 2023-05-19 LAB — BRAIN NATRIURETIC PEPTIDE: B Natriuretic Peptide: 809.8 pg/mL — ABNORMAL HIGH (ref 0.0–100.0)

## 2023-05-19 LAB — LACTIC ACID, PLASMA: Lactic Acid, Venous: 2.1 mmol/L (ref 0.5–1.9)

## 2023-05-19 MED ORDER — SODIUM CHLORIDE 0.9% FLUSH: 3.0000 mL | INTRAVENOUS | Status: DC | PRN

## 2023-05-19 MED ORDER — ENOXAPARIN SODIUM 80 MG/0.8ML IJ SOSY: 70.0000 mg | PREFILLED_SYRINGE | INTRAMUSCULAR | Status: DC

## 2023-05-19 MED ORDER — METOPROLOL SUCCINATE ER 50 MG PO TB24
200.0000 mg | ORAL_TABLET | Freq: Every day | ORAL | Status: DC
  Administered 2023-05-19: 200 mg via ORAL

## 2023-05-19 MED ORDER — SPIRONOLACTONE 12.5 MG HALF TABLET
12.5000 mg | ORAL_TABLET | Freq: Every day | ORAL | Status: DC
  Administered 2023-05-19: 12.5 mg via ORAL
  Filled 2023-05-19: qty 1

## 2023-05-19 MED ORDER — ONDANSETRON HCL 4 MG/2ML IJ SOLN: 4.0000 mg | Freq: Three times a day (TID) | INTRAMUSCULAR | Status: AC | PRN

## 2023-05-19 MED ORDER — DIGOXIN 125 MCG PO TABS
125.0000 ug | ORAL_TABLET | Freq: Every day | ORAL | Status: DC
  Administered 2023-05-19: 125 ug via ORAL
  Filled 2023-05-19: qty 1

## 2023-05-19 MED ORDER — SODIUM CHLORIDE 0.9 % IV SOLN: 250.0000 mL | INTRAVENOUS | Status: DC | PRN

## 2023-05-19 MED ORDER — POTASSIUM CHLORIDE 10 MEQ/100ML IV SOLN
10.0000 meq | INTRAVENOUS | Status: AC
  Administered 2023-05-19 (×4): 10 meq via INTRAVENOUS

## 2023-05-19 MED ORDER — FUROSEMIDE 40 MG PO TABS: 40.0000 mg | ORAL_TABLET | Freq: Every day | ORAL | Status: DC

## 2023-05-19 MED ORDER — SODIUM CHLORIDE 0.9% FLUSH
3.0000 mL | Freq: Two times a day (BID) | INTRAVENOUS | Status: DC
  Administered 2023-05-19: 3 mL via INTRAVENOUS

## 2023-05-19 MED ORDER — FUROSEMIDE 10 MG/ML IJ SOLN
60.0000 mg | Freq: Once | INTRAMUSCULAR | Status: AC
  Administered 2023-05-19: 60 mg via INTRAVENOUS

## 2023-05-19 NOTE — ED Notes (Signed)
The brother of patient Ronald Horne) called at this time. [phone]

## 2023-05-19 NOTE — Assessment & Plan Note (Signed)
K 3.1  Replete  Check Mg level

## 2023-05-19 NOTE — ED Notes (Signed)
Patient pull his IV out, removed monitor, and refused to have vitals completed. Patient refused to sign the AMA form. He exited the unit.

## 2023-05-19 NOTE — Progress Notes (Signed)
Received report from nursing that patient pulled out his IV removed his monitor and refused to have his vitals completed. I went to the bedside as patient wants to leave the hospital AGAINST MEDICAL ADVICE. I discussed with patient about current treatment for CHF as well as transient hypoxia noted on pulse oximetry. Discussed with patient to be went to appropriate diuresis patient over the course of the night and make sure the hypoxia resolved. Patient states he wants to go home I discussed risks of leaving the hospital including worsening hypoxia, CHF and even death. Patient still desires to go home Patient will be leaving the hospital AGAINST MEDICAL ADVICE.

## 2023-05-19 NOTE — Assessment & Plan Note (Addendum)
2D ECHO 12/2021 w/ EF 30-35% and grade 2 DD  Decompensated CHF in setting of missed home medications x 2-4 days  BNP 810 CXR w/ cardiomegaly and interstitial edema  Currently on BIPAP  S/p IV lasix in the ER Restart home regimen  Dr. Welton Flakes w/ cardiology consulted for further management

## 2023-05-19 NOTE — ED Triage Notes (Signed)
Pt bib [facility] from home for shortness of breath after laying down. Pt reports not using cpap today and not taking lasix since Wednesday. Pt hypoxic at 88% and wheezing noted by [facility] on arrival. Pt placed on CPAP with ems. 325mg  solumedrol given PTA.   BP 140/100, HR 103

## 2023-05-19 NOTE — ED Notes (Signed)
Pt refusing resp swab at this time. MD notified.

## 2023-05-19 NOTE — H&P (Signed)
History and Physical    Patient: Ronald Horne DOB: June 21, 1951 DOA: 05/19/2023 DOS: the patient was seen and examined on 05/19/2023 PCP: Sherrie Mustache, MD  Patient coming from: Home  Chief Complaint:  Chief Complaint  Patient presents with   Shortness of Breath   HPI: Wheeler Incorvaia is a 72 y.o. male with medical history significant of HFpEF, GERD, gout, HTN presenting w/ acute HFrEF, acute resp failure w/ hypoxia, elevated trop. Pt reports going out of town and missing 2-4 days of his baseline heart failure regimen including his lasix. Pt reports worsening orthopnea and PND. No report CP. Does report increased fluid intake give high temperatures outside. No reported NSAID use. No nausea or vomiting. Mild LE swelling. Has had significantly worsened SOB over past 24 hours.  Presented to ER afebrile, hemodynamically stable. Requiring BIPAP to keep O2 sats >97%. WBC 7.4, hgb 15.6, Cr 1. K 3.1 . Trop 30s-40s. Lactate 2.1. BNP 810. CXR w/ cardiomegaly and mild pulmonary interstitial edema.  Review of Systems: As mentioned in the history of present illness. All other systems reviewed and are negative. Past Medical History:  Diagnosis Date   Arthritis    Cardiomyopathy (HCC)    a. 08/2016 Echo: EF 30-35%, diff HK; b. 03/2018 Ex MV ([facility]): Ex time 2:00, large anter defect, EF 24%; c. 03/2018 Echo: EF 20-25%, diff HK. Gr1 DD; d. Refused cath.   Chronic back pain    Chronic combined systolic (congestive) and diastolic (congestive) heart failure (HCC)    a. 08/2016 Echo: EF 30-35%, diff HK, mild to mod MR, mild BAE; b. 03/2018 Echo: EF 20-25%, diff HK. Gr1 DD. MIldly dil Ao root. Mild BAE. Mildly reduced RV fxn.   GERD (gastroesophageal reflux disease)    Gout    Hypertension    Obesity    Past Surgical History:  Procedure Laterality Date   EPIDURAL BLOCK INJECTION     chronic back pain    LEFT HEART CATH AND CORONARY ANGIOGRAPHY Left 12/27/2019   Procedure: LEFT HEART CATH AND  CORONARY ANGIOGRAPHY;  Surgeon: Laurier Nancy, MD;  Location: [facility] INVASIVE CV LAB;  Service: Cardiovascular;  Laterality: Left;   TONSILLECTOMY     Social History:  reports that he quit smoking about 46 years ago. His smoking use included cigarettes. He started smoking about 47 years ago. He has a 3 pack-year smoking history. He has never used smokeless tobacco. He reports that he does not drink alcohol and does not use drugs.  No Known Allergies  History reviewed. No pertinent family history.  Prior to Admission medications   Medication Sig Start Date End Date Taking? Authorizing Provider  albuterol (VENTOLIN HFA) 108 (90 Base) MCG/ACT inhaler Inhale into the lungs. 03/15/23  Yes [provider]  ascorbic acid (VITAMIN C) 500 MG tablet Take 500 mg by mouth daily.   Yes [provider]  CORLANOR 7.5 MG TABS tablet Take 7.5 mg by mouth 2 (two) times daily. 06/30/22  Yes [provider]  ergocalciferol (VITAMIN D2) 1.25 MG (50000 UT) capsule Take 50,000 Units by mouth once a week.   Yes [provider]  furosemide (LASIX) 40 MG tablet Take 1 tablet (40 mg total) by mouth daily. 08/01/22  Yes Enedina Finner, MD  methimazole (TAPAZOLE) 5 MG tablet Take 7.5 mg by mouth daily.   Yes [provider]  metoprolol tartrate (LOPRESSOR) 100 MG tablet Take 100 mg by mouth 2 (two) times daily. 04/26/23  Yes [provider]  sacubitril-valsartan (ENTRESTO) 97-103 MG TAKE 1 TABLET BY MOUTH TWICE A DAY 04/27/23  Yes Laurier Nancy, MD  spironolactone (ALDACTONE) 25 MG tablet TAKE 1/2 TABLET BY MOUTH DAILY 04/27/23  Yes Laurier Nancy, MD  aspirin 81 MG tablet Take 1 tablet (81 mg total) by mouth daily. Patient not taking: Reported on 10/06/2022 08/01/22   Enedina Finner, MD  atorvastatin (LIPITOR) 10 MG tablet Take 1 tablet (10 mg total) by mouth daily. 01/20/23   Laurier Nancy, MD  dapagliflozin propanediol (FARXIGA) 10 MG TABS tablet TAKE 1 TABLET BY MOUTH DAILY  04/27/23   Laurier Nancy, MD  digoxin (LANOXIN) 0.125 MG tablet Take 1 tablet (125 mcg total) by mouth daily. 03/24/23 03/23/24  Laurier Nancy, MD    Physical Exam: Vitals:   05/19/23 0300 05/19/23 0330 05/19/23 0515 05/19/23 0717  BP: 112/84 102/84 112/87   Pulse: 85 78 84   Resp: 15 17 14    Temp:    (!) 97 F (36.1 C)  TempSrc:    Axillary  SpO2: 97% 97% 97%   Weight:      Height:       Physical Exam Constitutional:      Appearance: He is obese.  HENT:     Head: Normocephalic and atraumatic.     Nose: Nose normal.     Mouth/Throat:     Mouth: Mucous membranes are moist.  Cardiovascular:     Rate and Rhythm: Normal rate and regular rhythm.  Pulmonary:     Effort: Pulmonary effort is normal.     Breath sounds: Rales present.  Abdominal:     General: Bowel sounds are normal.  Musculoskeletal:        General: Normal range of motion.  Skin:    General: Skin is warm.  Neurological:     General: No focal deficit present.  Psychiatric:        Mood and Affect: Mood normal.     Data Reviewed:  There are no new results to review at this time. DG Chest Portable 1 View CLINICAL DATA:  Shortness of breath after lying down.  EXAM: PORTABLE CHEST 1 VIEW  COMPARISON:  Chest radiograph 07/30/2022  FINDINGS: The heart is enlarged, unchanged. The upper mediastinal contours are normal. The pulmonary vasculature is prominent.  There is vascular congestion and mild pulmonary interstitial edema. There is no focal consolidation. There is no significant pleural effusion. There is no pneumothorax  There is no acute osseous abnormality.  IMPRESSION: Cardiomegaly and mild pulmonary interstitial edema.  Electronically Signed   By: Lesia Hausen M.D.   On: 05/19/2023 08:42  Lab Results  Component Value Date   WBC 7.4 05/19/2023   HGB 15.6 05/19/2023   HCT 46.6 05/19/2023   MCV 89.3 05/19/2023   PLT 215 05/19/2023   Last metabolic panel Lab Results  Component Value  Date   GLUCOSE 152 (H) 05/19/2023   NA 139 05/19/2023   K 3.1 (L) 05/19/2023   CL 111 05/19/2023   CO2 20 (L) 05/19/2023   BUN 13 05/19/2023   CREATININE 1.00 05/19/2023   GFRNONAA >60 05/19/2023   CALCIUM 8.0 (L) 05/19/2023   PROT 7.2 05/19/2023   ALBUMIN 3.8 05/19/2023   BILITOT 0.5 05/19/2023   ALKPHOS 59 05/19/2023   AST 22 05/19/2023   ALT 15 05/19/2023   ANIONGAP 8 05/19/2023    Assessment and Plan: Acute HFrEF (heart failure with reduced ejection fraction) (HCC) 2D ECHO 12/2021 w/ EF 30-35%  and grade 2 DD  Decompensated CHF in setting of missed home medications x 2-4 days  BNP 810 CXR w/ cardiomegaly and interstitial edema  Currently on BIPAP  S/p IV lasix in the ER Restart home regimen  Dr. Welton Flakes w/ cardiology consulted for further management    Acute respiratory failure with hypoxia (HCC) Decompensated resp failure requiring BIPAP in the setting of CHF Exacerbation  S/p IV lasix in the ER  CXR w/ cardiomegaly and interstitial edema  Monitor resp status w/ diuresis  Follow closely  Elevated troponin Trop 30s-40s in setting of CHF exacerbation  No active CP  Suspect trace secondary demand mismatch  Trend trop  Cont home regimen  Follow closely   Hypokalemia K 3.1  Replete  Check Mg level   Hypertension BP stable  Titrate home regimen w/ diuresis     Greater than 50% was spent in counseling and coordination of care with patient Total encounter time 80 minutes or more    Advance Care Planning:   Code Status: Full Code   Consults: Dr. Welton Flakes w/ cardiology   Family Communication: No family at the bedside   Severity of Illness: The appropriate patient status for this patient is INPATIENT. Inpatient status is judged to be reasonable and necessary in order to provide the required intensity of service to ensure the patient's safety. The patient's presenting symptoms, physical exam findings, and initial radiographic and laboratory data in the context of  their chronic comorbidities is felt to place them at high risk for further clinical deterioration. Furthermore, it is not anticipated that the patient will be medically stable for discharge from the hospital within 2 midnights of admission.   * I certify that at the point of admission it is my clinical judgment that the patient will require inpatient hospital care spanning beyond 2 midnights from the point of admission due to high intensity of service, high risk for further deterioration and high frequency of surveillance required.*  Author: Floydene Flock, MD 05/19/2023 9:23 AM  For on call review [email or web address].

## 2023-05-19 NOTE — Assessment & Plan Note (Signed)
Decompensated resp failure requiring BIPAP in the setting of CHF Exacerbation  S/p IV lasix in the ER  CXR w/ cardiomegaly and interstitial edema  Monitor resp status w/ diuresis  Follow closely

## 2023-05-19 NOTE — Consult Note (Signed)
Ronald Horne is a 72 y.o. male  [id number]  Primary Cardiologist: Chastelyn Athens Reason for Consultation: CHF  HPI: 47 YOBM, presented to ER with severe shortness of breath but had not taken his medication and use cpap for few days as went on trip. He had PND/Orthopnea, but no chest pain.   Review of Systems: No chest pain   Past Medical History:  Diagnosis Date   Arthritis    Cardiomyopathy (HCC)    a. 08/2016 Echo: EF 30-35%, diff HK; b. 03/2018 Ex MV ([facility]): Ex time 2:00, large anter defect, EF 24%; c. 03/2018 Echo: EF 20-25%, diff HK. Gr1 DD; d. Refused cath.   Chronic back pain    Chronic combined systolic (congestive) and diastolic (congestive) heart failure (HCC)    a. 08/2016 Echo: EF 30-35%, diff HK, mild to mod MR, mild BAE; b. 03/2018 Echo: EF 20-25%, diff HK. Gr1 DD. MIldly dil Ao root. Mild BAE. Mildly reduced RV fxn.   GERD (gastroesophageal reflux disease)    Gout    Hypertension    Obesity     (Not in a hospital admission)     digoxin  125 mcg Oral Daily   enoxaparin (LOVENOX) injection  70 mg Subcutaneous Q24H   [START ON 05/20/2023] furosemide  40 mg Oral Daily   metoprolol succinate  200 mg Oral Daily   metoprolol succinate  200 mg Oral Daily   sodium chloride flush  3 mL Intravenous Q12H   spironolactone  12.5 mg Oral Daily    Infusions:  sodium chloride     potassium chloride 10 mEq (05/19/23 1213)    No Known Allergies  Social History   Socioeconomic History   Marital status: Single    Spouse name: Not on file   Number of children: Not on file   Years of education: Not on file   Highest education level: Not on file  Occupational History   Not on file  Tobacco Use   Smoking status: Former    Current packs/day: 0.00    Average packs/day: 3.0 packs/day for 1 year (3.0 ttl pk-yrs)    Types: Cigarettes    Start date: 12/13/1975    Quit date: 12/12/1976    Years since quitting: 46.4   Smokeless tobacco: Never  Vaping Use   Vaping  status: Never Used  Substance and Sexual Activity   Alcohol use: No   Drug use: No   Sexual activity: Not on file  Other Topics Concern   Not on file  Social History Narrative   Not on file   Social Determinants of Health   Financial Resource Strain: Not on file  Food Insecurity: No Food Insecurity (07/31/2022)   Hunger Vital Sign    Worried About Running Out of Food in the Last Year: Never true    Ran Out of Food in the Last Year: Never true  Transportation Needs: No Transportation Needs (07/31/2022)   PRAPARE - Administrator, Civil Service (Medical): No    Lack of Transportation (Non-Medical): No  Physical Activity: Not on file  Stress: Not on file  Social Connections: Not on file  Intimate Partner Violence: Not At Risk (07/31/2022)   Humiliation, Afraid, Rape, and Kick questionnaire    Fear of Current or Ex-Partner: No    Emotionally Abused: No    Physically Abused: No    Sexually Abused: No    History reviewed. No pertinent family history.  PHYSICAL EXAM: Vitals:   05/19/23  0515 05/19/23 0717  BP: 112/87   Pulse: 84   Resp: 14   Temp:  (!) 97 F (36.1 C)  SpO2: 97%      Intake/Output Summary (Last 24 hours) at 05/19/2023 1229 Last data filed at 05/19/2023 1147 Gross per 24 hour  Intake 100 ml  Output 1600 ml  Net -1500 ml    General:  Well appearing. No respiratory difficulty HEENT: normal Neck: supple. no JVD. Carotids 2+ bilat; no bruits. No lymphadenopathy or thryomegaly appreciated. Cor: PMI nondisplaced. Regular rate & rhythm. No rubs, gallops or murmurs. Lungs: clear Abdomen: soft, nontender, nondistended. No hepatosplenomegaly. No bruits or masses. Good bowel sounds. Extremities: no cyanosis, clubbing, rash, edema Neuro: alert & oriented x 3, cranial nerves grossly intact. moves all 4 extremities w/o difficulty. Affect pleasant.  ECG: NSR on monitor, but no EKG done  Results for orders placed or performed during the hospital encounter  of 05/19/23 (from the past 24 hour(s))  CBC with Differential     Status: None   Collection Time: 05/19/23  2:30 AM  Result Value Ref Range   WBC 7.4 4.0 - 10.5 K/uL   RBC 5.22 4.22 - 5.81 MIL/uL   Hemoglobin 15.6 13.0 - 17.0 g/dL   HCT 26.9 48.5 - 46.2 %   MCV 89.3 80.0 - 100.0 fL   MCH 29.9 26.0 - 34.0 pg   MCHC 33.5 30.0 - 36.0 g/dL   RDW 70.3 50.0 - 93.8 %   Platelets 215 150 - 400 K/uL   nRBC 0.0 0.0 - 0.2 %   Neutrophils Relative % 62 %   Neutro Abs 4.6 1.7 - 7.7 K/uL   Lymphocytes Relative 30 %   Lymphs Abs 2.2 0.7 - 4.0 K/uL   Monocytes Relative 6 %   Monocytes Absolute 0.5 0.1 - 1.0 K/uL   Eosinophils Relative 1 %   Eosinophils Absolute 0.1 0.0 - 0.5 K/uL   Basophils Relative 1 %   Basophils Absolute 0.0 0.0 - 0.1 K/uL   Immature Granulocytes 0 %   Abs Immature Granulocytes 0.02 0.00 - 0.07 K/uL  Comprehensive metabolic panel     Status: Abnormal   Collection Time: 05/19/23  2:30 AM  Result Value Ref Range   Sodium 139 135 - 145 mmol/L   Potassium 3.1 (L) 3.5 - 5.1 mmol/L   Chloride 111 98 - 111 mmol/L   CO2 20 (L) 22 - 32 mmol/L   Glucose, Bld 152 (H) 70 - 99 mg/dL   BUN 13 8 - 23 mg/dL   Creatinine, Ser 1.82 0.61 - 1.24 mg/dL   Calcium 8.0 (L) 8.9 - 10.3 mg/dL   Total Protein 7.2 6.5 - 8.1 g/dL   Albumin 3.8 3.5 - 5.0 g/dL   AST 22 15 - 41 U/L   ALT 15 0 - 44 U/L   Alkaline Phosphatase 59 38 - 126 U/L   Total Bilirubin 0.5 0.3 - 1.2 mg/dL   GFR, Estimated >99 >37 mL/min   Anion gap 8 5 - 15  Troponin I (High Sensitivity)     Status: Abnormal   Collection Time: 05/19/23  2:30 AM  Result Value Ref Range   Troponin I (High Sensitivity) 39 (H) <18 ng/L  Lactic acid, plasma     Status: Abnormal   Collection Time: 05/19/23  2:30 AM  Result Value Ref Range   Lactic Acid, Venous 2.1 (HH) 0.5 - 1.9 mmol/L  Brain natriuretic peptide     Status: Abnormal  Collection Time: 05/19/23  2:30 AM  Result Value Ref Range   B Natriuretic Peptide 809.8 (H) 0.0 - 100.0  pg/mL  Troponin I (High Sensitivity)     Status: Abnormal   Collection Time: 05/19/23  4:30 AM  Result Value Ref Range   Troponin I (High Sensitivity) 45 (H) <18 ng/L   DG Chest Portable 1 View  Result Date: 05/19/2023 CLINICAL DATA:  Shortness of breath after lying down. EXAM: PORTABLE CHEST 1 VIEW COMPARISON:  Chest radiograph 07/30/2022 FINDINGS: The heart is enlarged, unchanged. The upper mediastinal contours are normal. The pulmonary vasculature is prominent. There is vascular congestion and mild pulmonary interstitial edema. There is no focal consolidation. There is no significant pleural effusion. There is no pneumothorax There is no acute osseous abnormality. IMPRESSION: Cardiomegaly and mild pulmonary interstitial edema. Electronically Signed   By: Lesia Hausen M.D.   On: 05/19/2023 08:42     ASSESSMENT AND PLAN: Decompensated CHF with severe LV systolic dysfunction(LVEF30%), due to no-compliance. Placed back on his medication and Bi-pap, and already feeling better. May f/u 2 weeks as before. No work up needed as had normal coronaries 2021. Thanks.  Saran Laviolette Welton Flakes

## 2023-05-19 NOTE — ED Notes (Signed)
1 set of blood cultures, 1 light green, 1 purple, 1 gold, and 1 gray top sent to lab with order requisition for cmp, cbc, bnp, and troponin.

## 2023-05-19 NOTE — Assessment & Plan Note (Signed)
Trop 30s-40s in setting of CHF exacerbation  No active CP  Suspect trace secondary demand mismatch  Trend trop  Cont home regimen  Follow closely

## 2023-05-19 NOTE — ED Notes (Signed)
CBG 155 

## 2023-05-19 NOTE — ED Provider Notes (Addendum)
[facility] Provider Note    Event Date/Time   First MD Initiated Contact with Patient 05/19/23 [phone]     (approximate)   History   Shortness of Breath   HPI  Ronald Horne is a 72 y.o. male  w/ ho chf pw cc of sob.  Has missed some doses of lasix because it makes him urinate too often.  Found by ems to be hypoxic to 88%.  Placed on cpap with improvement.  Was found to have wheeze.  Given neb and solumedrol.        Physical Exam   Triage Vital Signs: ED Triage Vitals  Encounter Vitals Group     BP      Systolic BP Percentile      Diastolic BP Percentile      Pulse      Resp      Temp      Temp src      SpO2      Weight      Height      Head Circumference      Peak Flow      Pain Score      Pain Loc      Pain Education      Exclude from Growth Chart     Most recent vital signs: Vitals:   05/19/23 0330 05/19/23 0515  BP: 102/84 112/87  Pulse: 78 84  Resp: 17 14  Temp:    SpO2: 97% 97%     Constitutional: Alert in mod resp distress Eyes: Conjunctivae are normal.  Head: Atraumatic. Nose: No congestion/rhinnorhea. Mouth/Throat: Mucous membranes are moist.   Neck: Painless ROM.  Cardiovascular:   Good peripheral circulation. Respiratory: mild tachypnea  No retractions.  Gastrointestinal: Soft and nontender.  Musculoskeletal:  no deformity Neurologic:  MAE spontaneously. No gross focal neurologic deficits are appreciated.  Skin:  Skin is warm, dry and intact. No rash noted. Psychiatric: Mood and affect are normal. Speech and behavior are normal.    ED Results / Procedures / Treatments   Labs (all labs ordered are listed, but only abnormal results are displayed) Labs Reviewed  COMPREHENSIVE METABOLIC PANEL - Abnormal; Notable for the following components:      Result Value   Potassium 3.1 (*)    CO2 20 (*)    Glucose, Bld 152 (*)    Calcium 8.0 (*)    All other components within normal limits  LACTIC ACID, PLASMA -  Abnormal; Notable for the following components:   Lactic Acid, Venous 2.1 (*)    All other components within normal limits  BRAIN NATRIURETIC PEPTIDE - Abnormal; Notable for the following components:   B Natriuretic Peptide 809.8 (*)    All other components within normal limits  TROPONIN I (HIGH SENSITIVITY) - Abnormal; Notable for the following components:   Troponin I (High Sensitivity) 39 (*)    All other components within normal limits  RESP PANEL BY RT-PCR (RSV, FLU A&B, COVID)  [id number]  CBC WITH DIFFERENTIAL/PLATELET  TROPONIN I (HIGH SENSITIVITY)     EKG  ED ECG REPORT I, Willy Eddy, the attending physician, personally viewed and interpreted this ECG.   Date: 05/19/2023  EKG Time: 5:00  Rate: 75  Rhythm: sinus  Axis: left  Intervals:lbbb  ST&T Change: no stemi, no depressions    RADIOLOGY Please see ED Course for my review and interpretation.  I personally reviewed all radiographic images ordered to evaluate for the above acute complaints  and reviewed radiology reports and findings.  These findings were personally discussed with the patient.  Please see medical record for radiology report.    PROCEDURES:  Critical Care performed: No  .Critical Care  Performed by: Willy Eddy, MD Authorized by: Willy Eddy, MD   Critical care provider statement:    Critical care time (minutes):  35   Critical care was necessary to treat or prevent imminent or life-threatening deterioration of the following conditions:  Respiratory failure   Critical care was time spent personally by me on the following activities:  Ordering and performing treatments and interventions, ordering and review of laboratory studies, ordering and review of radiographic studies, pulse oximetry, re-evaluation of patient's condition, review of old charts, obtaining history from patient or surrogate, examination of patient, evaluation of patient's response to treatment, discussions with  primary provider, discussions with consultants and development of treatment plan with patient or surrogate    MEDICATIONS ORDERED IN ED: Medications  furosemide (LASIX) injection 60 mg (60 mg Intravenous Given 05/19/23 0519)     IMPRESSION / MDM / ASSESSMENT AND PLAN / ED COURSE  I reviewed the triage vital signs and the nursing notes.                              Differential diagnosis includes, but is not limited to, Asthma, copd, CHF, pna, ptx, malignancy, Pe, anemia  Patient presenting to the ER for evaluation of symptoms as described above.  Based on symptoms, risk factors and considered above differential, this presenting complaint could reflect a potentially life-threatening illness therefore the patient will be placed on continuous pulse oximetry and telemetry for monitoring.  Laboratory evaluation will be sent to evaluate for the above complaints.     Clinical Course as of 05/19/23 0524  Fri May 19, 2023  0314 CXR without evidence of ptx.  Appears more comfortable on bipap. [PR]  0450 BNP 800.  Patient appearing improved on bipap.  Will order IV lasix.  Hospitalist consulted for admission. [PR]  0523 Mild elevation of lactic likley 2/2 hypoxia.  No leukocytosis.  Not c/w pna or infectious process.  [PR]    Clinical Course User Index [PR] Willy Eddy, MD     FINAL CLINICAL IMPRESSION(S) / ED DIAGNOSES   Final diagnoses:  Acute on chronic congestive heart failure, unspecified heart failure type [facility])     Rx / DC Orders   ED Discharge Orders     None        Note:  This document was prepared using Dragon voice recognition software and may include unintentional dictation errors.    Willy Eddy, MD 05/19/23 1610    Willy Eddy, MD 05/19/23 [phone]

## 2023-05-19 NOTE — TOC CM/SW Note (Signed)
Cm received consult for SNF placement. Cm awaiting PT/OT evaluations to complete SNF workup. Cm will continue to follow.

## 2023-05-19 NOTE — Assessment & Plan Note (Deleted)
2D ECHO 01/2023 w/ grade 3 DD  Noted acute exacerbation today in setting of missed medication for multiple days IV lasix in the ER  Restart home regimen  Follow up cardiology recommendations  Follow closely

## 2023-05-19 NOTE — Assessment & Plan Note (Signed)
BP stable  Titrate home regimen w/ diuresis 

## 2023-05-19 NOTE — ED Notes (Signed)
Patient stating he wants to leave and unhooking himself stating he does not want to be admitted to the hospital. MD [facility] notified.

## 2023-05-22 LAB — CBG MONITORING, ED: Glucose-Capillary: 155 mg/dL — ABNORMAL HIGH (ref 70–99)

## 2023-05-26 ENCOUNTER — Ambulatory Visit: Payer: Medicare HMO | Admitting: Cardiovascular Disease

## 2023-06-06 ENCOUNTER — Encounter: Payer: Medicare HMO | Admitting: Family

## 2023-06-06 ENCOUNTER — Telehealth: Payer: Self-pay | Admitting: Family

## 2023-06-06 NOTE — Progress Notes (Deleted)
PCP: Ronald Mustache, MD (last seen 05/24) Primary Cardiologist: Ronald Blackwater, MD (last seen 06/24)  HPI:  [name] is a 72 y/o male with a history of HTN, GERD, gout, hepatitis, hyperlipidemia, PVD/ PAD, anxiety and possible sleep apnea, previous tobacco use and chronic heart failure.   Admitted 05/19/23 due to acute HFrEF, acute resp failure w/ hypoxia, elevated trop. Pt reports going out of town and missing 2-4 days of his baseline heart failure regimen including his lasix. Pt reports worsening orthopnea and PND. CXR w/ cardiomegaly and mild pulmonary interstitial edema. IV lasix given.   Echo 04/16/18: EF 20-25% with mild LVH Echo 07/31/22: EF of 30-35% along with mild LVH, moderate LAE and mild MR.   LHC 12/27/19: Normal coronaries with mild to moderate LV dysfunction   He presents today for a HF follow-up visit with a chief complaint of   Currently receiving PT on his right shoulder due to suspected subacromial impingement with possible rotator cuff tendinopathy     ROS: All systems negative except as listed in HPI, PMH and Problem List.  SH:  Social History   Socioeconomic History   Marital status: Single    Spouse name: Not on file   Number of children: Not on file   Years of education: Not on file   Highest education level: Not on file  Occupational History   Not on file  Tobacco Use   Smoking status: Former    Current packs/day: 0.00    Average packs/day: 3.0 packs/day for 1 year (3.0 ttl pk-yrs)    Types: Cigarettes    Start date: 12/13/1975    Quit date: 12/12/1976    Years since quitting: 46.5   Smokeless tobacco: Never  Vaping Use   Vaping status: Never Used  Substance and Sexual Activity   Alcohol use: No   Drug use: No   Sexual activity: Not on file  Other Topics Concern   Not on file  Social History Narrative   Not on file   Social Determinants of Health   Financial Resource Strain: Not on file  Food Insecurity: No Food Insecurity (07/31/2022)    Hunger Vital Sign    Worried About Running Out of Food in the Last Year: Never true    Ran Out of Food in the Last Year: Never true  Transportation Needs: No Transportation Needs (07/31/2022)   PRAPARE - Administrator, Civil Service (Medical): No    Lack of Transportation (Non-Medical): No  Physical Activity: Not on file  Stress: Not on file  Social Connections: Not on file  Intimate Partner Violence: Not At Risk (07/31/2022)   Humiliation, Afraid, Rape, and Kick questionnaire    Fear of Current or Ex-Partner: No    Emotionally Abused: No    Physically Abused: No    Sexually Abused: No    FH: No family history on file.  Past Medical History:  Diagnosis Date   Arthritis    Cardiomyopathy (HCC)    a. 08/2016 Echo: EF 30-35%, diff HK; b. 03/2018 Ex MV ([facility]adali): Ex time 2:00, large anter defect, EF 24%; c. 03/2018 Echo: EF 20-25%, diff HK. Gr1 DD; d. Refused cath.   Chronic back pain    Chronic combined systolic (congestive) and diastolic (congestive) heart failure (HCC)    a. 08/2016 Echo: EF 30-35%, diff HK, mild to mod MR, mild BAE; b. 03/2018 Echo: EF 20-25%, diff HK. Gr1 DD. MIldly dil Ao root. Mild BAE. Mildly reduced RV fxn.  GERD (gastroesophageal reflux disease)    Gout    Hypertension    Obesity     Current Outpatient Medications  Medication Sig Dispense Refill   albuterol (VENTOLIN HFA) 108 (90 Base) MCG/ACT inhaler Inhale into the lungs.     ascorbic acid (VITAMIN C) 500 MG tablet Take 500 mg by mouth daily.     aspirin 81 MG tablet Take 1 tablet (81 mg total) by mouth daily. (Patient not taking: Reported on 10/06/2022) 30 tablet 2   atorvastatin (LIPITOR) 10 MG tablet Take 1 tablet (10 mg total) by mouth daily. 30 tablet 2   CORLANOR 7.5 MG TABS tablet Take 7.5 mg by mouth 2 (two) times daily.     dapagliflozin propanediol (FARXIGA) 10 MG TABS tablet TAKE 1 TABLET BY MOUTH DAILY 30 tablet 6   digoxin (LANOXIN) 0.125 MG tablet Take 1 tablet (125 mcg total) by  mouth daily. 30 tablet 11   ergocalciferol (VITAMIN D2) 1.25 MG (50000 UT) capsule Take 50,000 Units by mouth once a week.     furosemide (LASIX) 40 MG tablet Take 1 tablet (40 mg total) by mouth daily. 90 tablet 3   methimazole (TAPAZOLE) 5 MG tablet Take 7.5 mg by mouth daily.     metoprolol tartrate (LOPRESSOR) 100 MG tablet Take 100 mg by mouth 2 (two) times daily.     sacubitril-valsartan (ENTRESTO) 97-103 MG TAKE 1 TABLET BY MOUTH TWICE A DAY 60 tablet 6   spironolactone (ALDACTONE) 25 MG tablet TAKE 1/2 TABLET BY MOUTH DAILY 15 tablet 6   Current Facility-Administered Medications  Medication Dose Route Frequency Provider Last Rate Last Admin   metoprolol succinate (TOPROL-XL) 24 hr tablet 200 mg  200 mg Oral Daily Ronald Nancy, MD       Facility-Administered Medications Ordered in Other Visits  Medication Dose Route Frequency Provider Last Rate Last Admin   sodium chloride flush (NS) 0.9 % injection 3 mL  3 mL Intravenous Q12H Ronald Nancy, MD         PHYSICAL EXAM:  General:  Well appearing. No resp difficulty HEENT: normal Neck: supple. JVP flat. No lymphadenopathy or thryomegaly appreciated. Cor: PMI normal. Regular rate & rhythm. No rubs, gallops or murmurs. Lungs: clear Abdomen: soft, nontender, nondistended. No hepatosplenomegaly. No bruits or masses.  Extremities: no cyanosis, clubbing, rash, edema Neuro: alert & oriented x 3, cranial nerves grossly intact. Moves all 4 extremities w/o difficulty. Affect pleasant. Anxious   ECG: not done   ASSESSMENT & PLAN:  1: Chronic heart failure with reduced ejection fraction- - suspect due to  - NYHA class II - euvolemic today - weighing daily; reminded to call for an overnight weight gain of > 2 pounds or a weekly weight gain of > 5 pounds - weight 308 pounds from last visit here 2 months ago - Echo 04/16/18: EF 20-25% with mild LVH - Echo 07/31/22: EF of 30-35% along with mild LVH, moderate LAE and mild MR.  - LHC  12/27/19: Normal coronaries with mild to moderate LV dysfunction  - not adding salt and trying to eat low sodium foods - keeping daily fluid intake to 60-64 ouncess - continue entresto 97/103mg  BID - continue farxiga 10mg  daily - continue metoprolol 200mg  daily - continue spironolactone 12.5mg  daily; current BP will not allow for titration - continue corlanor 7.5mg  BID - continue digoxin 0.125mg  daily - continue furosemide 40mg  daily - saw cardiology Welton Flakes) 06/24 - BNP 05/19/23 was 809.8  2: HTN- - BP  -  saw PCP Dario Guardian)  - BMP 05/19/23 reviewed and showed sodium 139, potassium 3.6, creatinine 1.00 and GFR >60   3: Anxiety- - reports trying to limit his stress but says it's difficult because of numerous family members that share his home and he feels like he doesn't get any respect from them - voices understanding of how his mental health can certainly impact his physical heath  4: PVD/ PAD- - saw vascular (Dew) 11/23 - continue atorvastatin 10mg  daily  5: Sleep apnea- - saw pulmonology Karna Christmas) 01/24 - will get home sleep study set up

## 2023-06-06 NOTE — Telephone Encounter (Signed)
Patient did not show for his [facility] appointment on 06/06/23.

## 2023-06-09 ENCOUNTER — Encounter: Payer: Self-pay | Admitting: Cardiovascular Disease

## 2023-06-09 ENCOUNTER — Ambulatory Visit (INDEPENDENT_AMBULATORY_CARE_PROVIDER_SITE_OTHER): Payer: Medicare HMO | Admitting: Cardiovascular Disease

## 2023-06-09 VITALS — BP 90/80 | HR 57 | Ht 73.0 in | Wt 304.4 lb

## 2023-06-09 DIAGNOSIS — I5020 Unspecified systolic (congestive) heart failure: Secondary | ICD-10-CM

## 2023-06-09 DIAGNOSIS — I10 Essential (primary) hypertension: Secondary | ICD-10-CM

## 2023-06-09 DIAGNOSIS — I249 Acute ischemic heart disease, unspecified: Secondary | ICD-10-CM

## 2023-06-09 DIAGNOSIS — I739 Peripheral vascular disease, unspecified: Secondary | ICD-10-CM

## 2023-06-09 NOTE — Progress Notes (Signed)
Cardiology Office Note   Date:  06/09/2023   ID:  Ronald Horne, DOB 07-12-1951, MRN [id number]  PCP:  Sherrie Mustache, MD  Cardiologist:  Adrian Blackwater, MD      History of Present Illness: Ronald Horne is a 72 y.o. male who presents for  Chief Complaint  Patient presents with   Follow-up    1 mo & CPAP f/u    Feeling fine      Past Medical History:  Diagnosis Date   Arthritis    Cardiomyopathy (HCC)    a. 08/2016 Echo: EF 30-35%, diff HK; b. 03/2018 Ex MV (Jadali): Ex time 2:00, large anter defect, EF 24%; c. 03/2018 Echo: EF 20-25%, diff HK. Gr1 DD; d. Refused cath.   Chronic back pain    Chronic combined systolic (congestive) and diastolic (congestive) heart failure (HCC)    a. 08/2016 Echo: EF 30-35%, diff HK, mild to mod MR, mild BAE; b. 03/2018 Echo: EF 20-25%, diff HK. Gr1 DD. MIldly dil Ao root. Mild BAE. Mildly reduced RV fxn.   GERD (gastroesophageal reflux disease)    Gout    Hypertension    Obesity      Past Surgical History:  Procedure Laterality Date   EPIDURAL BLOCK INJECTION     chronic back pain    LEFT HEART CATH AND CORONARY ANGIOGRAPHY Left 12/27/2019   Procedure: LEFT HEART CATH AND CORONARY ANGIOGRAPHY;  Surgeon: Laurier Nancy, MD;  Location: [facility] INVASIVE CV [facility];  Service: Cardiovascular;  Laterality: Left;   TONSILLECTOMY       Current Outpatient Medications  Medication Sig Dispense Refill   acetaminophen (TYLENOL) 325 MG tablet Take 500 mg by mouth every 6 (six) hours as needed for moderate pain or mild pain.     albuterol (VENTOLIN HFA) 108 (90 Base) MCG/ACT inhaler Inhale into the lungs.     ascorbic acid (VITAMIN C) 500 MG tablet Take 500 mg by mouth daily.     atorvastatin (LIPITOR) 10 MG tablet Take 1 tablet (10 mg total) by mouth daily. 30 tablet 2   CORLANOR 7.5 MG TABS tablet Take 7.5 mg by mouth 2 (two) times daily.     digoxin (LANOXIN) 0.125 MG tablet Take 1 tablet (125 mcg total) by mouth daily. 30 tablet 11   furosemide  (LASIX) 40 MG tablet Take 1 tablet (40 mg total) by mouth daily. 90 tablet 3   methimazole (TAPAZOLE) 5 MG tablet Take 7.5 mg by mouth daily.     metoprolol tartrate (LOPRESSOR) 100 MG tablet Take 100 mg by mouth 2 (two) times daily.     Multiple Vitamin (MULTIVITAMIN WITH MINERALS) TABS tablet Take 1 tablet by mouth daily.     sacubitril-valsartan (ENTRESTO) 97-103 MG TAKE 1 TABLET BY MOUTH TWICE A DAY 60 tablet 6   spironolactone (ALDACTONE) 25 MG tablet TAKE 1/2 TABLET BY MOUTH DAILY 15 tablet 6   aspirin 81 MG tablet Take 1 tablet (81 mg total) by mouth daily. (Patient not taking: Reported on 10/06/2022) 30 tablet 2   dapagliflozin propanediol (FARXIGA) 10 MG TABS tablet TAKE 1 TABLET BY MOUTH DAILY 30 tablet 6   ergocalciferol (VITAMIN D2) 1.25 MG (50000 UT) capsule Take 50,000 Units by mouth once a week. (Patient not taking: Reported on 06/09/2023)     Current Facility-Administered Medications  Medication Dose Route Frequency Provider Last Rate Last Admin   metoprolol succinate (TOPROL-XL) 24 hr tablet 200 mg  200 mg Oral Daily Laurier Nancy, MD  Facility-Administered Medications Ordered in Other Visits  Medication Dose Route Frequency Provider Last Rate Last Admin   sodium chloride flush (NS) 0.9 % injection 3 mL  3 mL Intravenous Q12H Laurier Nancy, MD        Allergies:   Patient has no known allergies.    Social History:   reports that he quit smoking about 46 years ago. His smoking use included cigarettes. He started smoking about 47 years ago. He has a 3 pack-year smoking history. He has never used smokeless tobacco. He reports that he does not drink alcohol and does not use drugs.   Family History:  family history is not on file.    ROS:     Review of Systems  Constitutional: Negative.   HENT: Negative.    Eyes: Negative.   Respiratory: Negative.    Gastrointestinal: Negative.   Genitourinary: Negative.   Musculoskeletal: Negative.   Skin: Negative.    Neurological: Negative.   Endo/Heme/Allergies: Negative.   Psychiatric/Behavioral: Negative.    All other systems reviewed and are negative.     All other systems are reviewed and negative.    PHYSICAL EXAM: VS:  BP 90/80   Pulse (!) 57   Ht 6\' 1"  (1.854 m)   Wt (!) 304 lb 6.4 oz (138.1 kg)   SpO2 99%   BMI 40.16 kg/m  , BMI Body mass index is 40.16 kg/m. Last weight:  Wt Readings from Last 3 Encounters:  06/09/23 (!) 304 lb 6.4 oz (138.1 kg)  05/19/23 300 lb (136.1 kg)  04/21/23 (!) 307 lb 12.8 oz (139.6 kg)     Physical Exam Vitals reviewed.  Constitutional:      Appearance: Normal appearance. He is normal weight.  HENT:     Head: Normocephalic.     Nose: Nose normal.     Mouth/Throat:     Mouth: Mucous membranes are moist.  Eyes:     Pupils: Pupils are equal, round, and reactive to light.  Cardiovascular:     Rate and Rhythm: Normal rate and regular rhythm.     Pulses: Normal pulses.     Heart sounds: Normal heart sounds.  Pulmonary:     Effort: Pulmonary effort is normal.  Abdominal:     General: Abdomen is flat. Bowel sounds are normal.  Musculoskeletal:        General: Normal range of motion.     Cervical back: Normal range of motion.  Skin:    General: Skin is warm.  Neurological:     General: No focal deficit present.     Mental Status: He is alert.  Psychiatric:        Mood and Affect: Mood normal.       EKG:   Recent Labs: 07/30/2022: TSH 0.376 05/19/2023: ALT 15; B Natriuretic Peptide 809.8; BUN 20; Creatinine, Ser 1.00; Hemoglobin 15.6; Magnesium 2.3; Platelets 215; Potassium 3.6; Sodium 139    Lipid Panel No results found for: "CHOL", "TRIG", "HDL", "CHOLHDL", "VLDL", "LDLCALC", "LDLDIRECT"    Other studies Reviewed: Additional studies/ records that were reviewed today include:  Review of the above records demonstrates:      04/12/2018   11:13 AM  PAD Screen  Previous PAD dx? No  Previous surgical procedure? No  Pain with  walking? No  Feet/toe relief with dangling? No  Painful, non-healing ulcers? No  Extremities discolored? No      ASSESSMENT AND PLAN:    ICD-10-CM   1. Acute coronary syndrome (HCC)  I24.9     2. Primary hypertension  I10     3. PAD (peripheral artery disease) (HCC)  I73.9     4. HFrEF (heart failure with reduced ejection fraction) (HCC)  I50.20    LVEF 35%, advise contiue current med       Problem List Items Addressed This Visit       Cardiovascular and Mediastinum   Acute coronary syndrome (HCC) - Primary   Hypertension   PAD (peripheral artery disease) (HCC)   Other Visit Diagnoses     HFrEF (heart failure with reduced ejection fraction) (HCC)       LVEF 35%, advise contiue current med          Disposition:   Return in about 2 months (around 08/09/2023).    Total time spent: 30 minutes  Signed,  Adrian Blackwater, MD  06/09/2023 9:27 AM    [facility]

## 2023-06-26 ENCOUNTER — Ambulatory Visit
Admission: RE | Admit: 2023-06-26 | Discharge: 2023-06-26 | Disposition: A | Discharge status: Home / Self Care | Payer: Medicare HMO | Source: Ambulatory Visit | Attending: Internal Medicine | Admitting: Internal Medicine

## 2023-06-26 ENCOUNTER — Other Ambulatory Visit: Payer: Self-pay | Admitting: Internal Medicine

## 2023-06-26 DIAGNOSIS — G8929 Other chronic pain: Secondary | ICD-10-CM

## 2023-06-26 DIAGNOSIS — M545 Low back pain, unspecified: Secondary | ICD-10-CM | POA: Diagnosis present

## 2023-07-31 ENCOUNTER — Ambulatory Visit: Payer: Medicare HMO | Attending: Sports Medicine

## 2023-07-31 DIAGNOSIS — M542 Cervicalgia: Secondary | ICD-10-CM | POA: Insufficient documentation

## 2023-07-31 NOTE — Therapy (Signed)
OUTPATIENT PHYSICAL THERAPY CERVICAL EVALUATION   Patient Name: Ronald Horne MRN: [id number] DOB:11-26-1950, 72 y.o., male Today's Date: 07/31/2023  END OF SESSION:  PT End of Session - 07/31/23 1341     Visit Number 1    Number of Visits 17    Date for PT Re-Evaluation 09/29/23    Authorization - Number of Visits 60    PT Start Time 1344    PT Stop Time 1429    PT Time Calculation (min) 45 min    Activity Tolerance Patient tolerated treatment well    Behavior During Therapy [facility] for tasks assessed/performed             Past Medical History:  Diagnosis Date   Arthritis    Cardiomyopathy (HCC)    a. 08/2016 Echo: EF 30-35%, diff HK; b. 03/2018 Ex MV ([facility]adali): Ex time 2:00, large anter defect, EF 24%; c. 03/2018 Echo: EF 20-25%, diff HK. Gr1 DD; d. Refused cath.   Chronic back pain    Chronic combined systolic (congestive) and diastolic (congestive) heart failure (HCC)    a. 08/2016 Echo: EF 30-35%, diff HK, mild to mod MR, mild BAE; b. 03/2018 Echo: EF 20-25%, diff HK. Gr1 DD. MIldly dil Ao root. Mild BAE. Mildly reduced RV fxn.   GERD (gastroesophageal reflux disease)    Gout    Hypertension    Obesity    Past Surgical History:  Procedure Laterality Date   EPIDURAL BLOCK INJECTION     chronic back pain    LEFT HEART CATH AND CORONARY ANGIOGRAPHY Left 12/27/2019   Procedure: LEFT HEART CATH AND CORONARY ANGIOGRAPHY;  Surgeon: Laurier Nancy, MD;  Location: [facility] INVASIVE CV LAB;  Service: Cardiovascular;  Laterality: Left;   TONSILLECTOMY     Patient Active Problem List   Diagnosis Date Noted   CHF (congestive heart failure) (HCC) 05/19/2023   PAD (peripheral artery disease) (HCC) 09/02/2022   Acute respiratory failure with hypoxia (HCC) 07/31/2022   CHF exacerbation (HCC) 07/30/2022   Acute HFrEF (heart failure with reduced ejection fraction) (HCC) 07/30/2022   Hypokalemia 07/30/2022   Hyperglycemia 07/30/2022   Elevated troponin 07/30/2022   Obesity     Hypertension    GERD (gastroesophageal reflux disease)    Hyperthyroidism    Acute coronary syndrome (HCC) 11/28/2019    PCP: Sherrie Mustache, MD   REFERRING PROVIDER: Jerrel Ivory, DO  REFERRING DIAG: [phone] (ICD-10-CM) - Other muscle spasm M54.2 (ICD-10-CM) - Cervicalgia M47.812 (ICD-10-CM) - Spondylosis without myelopathy or radiculopathy, cervical region M50.323 (ICD-10-CM) - 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 Hand dominance: Right  PERTINENT HISTORY:  Cervicalgia. Took a medication that started with a T which made him drowsy which helped with the pain. Then was given a muscle relaxer which did not help, then got a shot in is R neck which did not help. He then got a muscle relaxer and also took tylenol with it. Neck pain begain about 2 weeks ago, gradual onset which has worsened since onset, currently a 7-8/10 neck pain currently. Pain is currently on his R posterior lateral neck. Pain wants to travel to the L.. Shoulder pain is better.   PAIN:  Are you having pain? Yes: NPRS scale: 7-8/10 Pain location: R posterior lateral neck (around levator scapula and R scalene area) Pain description: tight, achy Aggravating factors: R > L cervical rotation Relieving factors: bengay, heating pad  PRECAUTIONS: Hx of Cardiomyopathy, CHF   RED FLAGS: Bowel or bladder incontinence: No and Cauda equina syndrome: No     WEIGHT BEARING RESTRICTIONS: No  FALLS:  Has patient fallen in last 6 months? Yes.  Number of falls 1 Fell 5 days ago while sitting on a chair, reaching for the fridge on wet floor trying to fix the leaking fridge and his chair slipped. No other falls.   LIVING ENVIRONMENT: Lives with: lives with their family Lives in: House/apartment Stairs: Yes: Internal: 15 steps; on left going up and discontinues halfway and External: 4-5 steps; on right going up Has following equipment at home:  First floor set up    OCCUPATION:   PLOF: Independent  PATIENT GOALS: Decrease pain. Make the pain go away.   NEXT MD VISIT: Come back as needed  OBJECTIVE:  Note: Objective measures were completed at Evaluation unless otherwise noted.  DIAGNOSTIC FINDINGS:   X-ray cervical spine 2 to 3 views 07/11/2023 EXAM:  Cervical spine Radiographs - 2 views (AP, lateral) performed 07/11/2023   CLINICAL INFORMATION: Right side neck pain   COMPARISON: None   FINDINGS:   The cervical spine is visualized to the C6-7 intervertebral disc space in  the lateral view.  Mild straightening of the normal cervical lordosis.   Mild disc space narrowing at C6-7.  Mild facet hypertrophic changes worse  from C5-C7.  There are no fractures or dislocations.  No soft tissue  swelling.   PATIENT SURVEYS:  FOTO neck 42  COGNITION: Overall cognitive status: Within functional limits for tasks assessed  SENSATION:   POSTURE:  forward neck, slight L cervical side bend, upper thoracic kyphosis, movement preference around C5/6 area.   PALPATION: TTP L posterior neck around C5/C6 area. Muscle tension R upper trap and levator scapula muscles   CERVICAL ROM:   Active ROM A/PROM (deg) eval  Flexion WFL with reproduction of R posterior lateral neck pain around levator scapular area with overpressure  Extension WFL with reproduction of R posterior lateral neck pain around C5/6 area with overpressure  Right lateral flexion WFL with R lateral neck pain around C5/6 area  Left lateral flexion WFL with R  lateral neck stretch.   Right rotation 28 degrees with R lateral neck pain, R lateral neck tightness  Left rotation 38 degrees with R lateral neck stretch   (Blank rows = not tested)  UPPER EXTREMITY ROM:  Active ROM Right eval Left eval  Shoulder flexion    Shoulder extension    Shoulder abduction    Shoulder adduction    Shoulder extension  Shoulder internal rotation    Shoulder external rotation    Elbow flexion    Elbow extension    Wrist flexion    Wrist extension    Wrist ulnar deviation    Wrist radial deviation    Wrist pronation    Wrist supination     (Blank rows = not tested)  UPPER EXTREMITY MMT:  MMT Right eval Left eval  Shoulder flexion 4 4-  Shoulder extension    Shoulder abduction    Shoulder adduction    Shoulder extension    Shoulder internal rotation    Shoulder external rotation    Middle trapezius    Lower trapezius seated manually resisted 4 4  Elbow flexion 4 4  Elbow extension 4+ 4  Wrist flexion    Wrist extension 4+ 4+  Wrist ulnar deviation    Wrist radial deviation    Wrist pronation    Wrist supination    Grip strength     (Blank rows = not tested)  CERVICAL SPECIAL TESTS:  Spurling's test: Negative and Distraction test: Positive Decreased pain with cervical distraction    FUNCTIONAL TESTS:    TODAY'S TREATMENT:                                                                                                                              DATE: 07/31/2023   Therapeutic exercise   Seated R levator scapula stretch 30 seconds x 5  Pt was recommended to perform scapular retractions throughout the day. Pt verbalized and demonstrated understanding.    Improved exercise technique, movement at target joints, use of target muscles after mod verbal, visual, tactile cues.        PATIENT EDUCATION:  Education details: there-ex, HEP, POC Person educated: Patient Education method: Explanation, Demonstration, Tactile cues,  Verbal cues, and Handouts Education comprehension: verbalized understanding and returned demonstration  HOME EXERCISE PROGRAM: Access Code: [email or web address] URL: [email or web address] Date: 07/31/2023 Prepared by: Loralyn Freshwater  Exercises - Gentle Levator Scapulae Stretch  - 3 x daily - 7 x weekly - 1 sets - 5 reps - 30 seconds  hold    ASSESSMENT:  CLINICAL IMPRESSION: Patient is a 72 y.o. male who was seen today for physical therapy evaluation and treatment for cervicalgia. He also presents with altered posture and cervical mechanics with increased extension stress around the C5/6 area, B scapular weakness, TTP posterior neck, R upper trap and levator scapula muscle tension, anterior cervical weakness, and difficulty performing tasks which involve looking to the R > to the L secondary to pain. Pt will benefit from skilled physical tehrapy services to address the aforementioned deficits.       OBJECTIVE IMPAIRMENTS: decreased ROM, improper body mechanics, postural dysfunction, and pain.   ACTIVITY LIMITATIONS: sleeping and looking around  PARTICIPATION LIMITATIONS:   PERSONAL FACTORS: Age, Fitness, Past/current experiences, and 3+ comorbidities: arthritis, cardiomyopathy, chronic back pain, CHF, HTN  are also affecting patient's functional outcome.  REHAB POTENTIAL: Fair    CLINICAL DECISION MAKING: Evolving/moderate complexity Pain is worsening since onset per pt.   EVALUATION COMPLEXITY: Moderate   GOALS: Goals reviewed with patient? Yes  SHORT TERM GOALS: Target date: 08/25/2023   Pt will be independent with his initial HEP to decrease pain, improve strength and ability to look around and sleep more comfortably.  Baseline: Pt has started his initial HEP (07/31/2023) Goal status: INITIAL   LONG TERM GOALS: Target date: 09/29/2023  Pt will have a decrease in neck pain to 5/10 or less at worst to promote ability to look around as well as sleep more comfortably.   Baseline: 9-10/10 neck pain at worst for the past 3 weeks. (07/31/2023) Goal status: INITIAL  2.  Pt will improve  B lower trap strength by at least 1/2 MMT to promote ability to look around with less neck pain.  Baseline:  MMT Right eval Left eval  Lower trapezius (seated manually resisted) 4 4  (07/31/2023) Goal status: INITIAL  3.  Pt will improve R and L cervical rotation AROM by at least 10 degrees to promote ability to look around with less difficulty.   Baseline:  Active ROM A/PROM (deg) eval  Right rotation 28 degrees with R lateral neck pain, R lateral neck tightness  Left rotation 38 degrees with R lateral neck stretch  (07/31/2023)  Goal status: INITIAL  4.  Pt will improve his neck FOTO score by at least 10 points as a demonstration of improved function.  Baseline: Neck FOTO 42 (07/31/2023) Goal status: INITIAL     PLAN:  PT FREQUENCY: 2x/week  PT DURATION: 8 weeks  PLANNED INTERVENTIONS: Therapeutic exercises, Therapeutic activity, Neuromuscular re-education, Patient/Family education, Dry Needling, Electrical stimulation, Traction, Manual therapy, and Re-evaluation  PLAN FOR NEXT SESSION: posture, lower trap, anterior cervical strengthening, thoracic extension, manual techniques, modalities PRN    Delynn Pursley, PT, DPT 07/31/2023, 4:37 PM

## 2023-08-04 ENCOUNTER — Ambulatory Visit: Payer: Medicare HMO | Attending: Sports Medicine

## 2023-08-04 ENCOUNTER — Telehealth: Payer: Self-pay

## 2023-08-04 DIAGNOSIS — M25511 Pain in right shoulder: Secondary | ICD-10-CM | POA: Insufficient documentation

## 2023-08-04 DIAGNOSIS — M542 Cervicalgia: Secondary | ICD-10-CM | POA: Insufficient documentation

## 2023-08-04 DIAGNOSIS — M25512 Pain in left shoulder: Secondary | ICD-10-CM | POA: Insufficient documentation

## 2023-08-04 NOTE — Telephone Encounter (Signed)
No show. Called patient and left a message pertaining to appointment and a reminder for the next follow up session. Return phone call requested. Phone number ([phone]) provided.   

## 2023-08-07 ENCOUNTER — Ambulatory Visit: Payer: Medicare HMO | Attending: Sports Medicine

## 2023-08-07 DIAGNOSIS — M25511 Pain in right shoulder: Secondary | ICD-10-CM | POA: Diagnosis present

## 2023-08-07 DIAGNOSIS — M542 Cervicalgia: Secondary | ICD-10-CM

## 2023-08-07 DIAGNOSIS — M25512 Pain in left shoulder: Secondary | ICD-10-CM | POA: Diagnosis present

## 2023-08-07 NOTE — Therapy (Signed)
OUTPATIENT PHYSICAL THERAPY Treatment   Patient Name: Ronald Horne MRN: [id number] DOB:07/30/1951, 72 y.o., male Today's Date: 08/07/2023  END OF SESSION:  PT End of Session - 08/07/23 1033     Visit Number 2    Number of Visits 17    Date for PT Re-Evaluation 09/29/23    Authorization - Number of Visits 60    PT Start Time 1033    PT Stop Time 1112    PT Time Calculation (min) 39 min    Activity Tolerance Patient tolerated treatment well    Behavior During Therapy [facility] for tasks assessed/performed              Past Medical History:  Diagnosis Date   Arthritis    Cardiomyopathy (HCC)    a. 08/2016 Echo: EF 30-35%, diff HK; b. 03/2018 Ex MV (J[facility]): Ex time 2:00, large anter defect, EF 24%; c. 03/2018 Echo: EF 20-25%, diff HK. Gr1 DD; d. Refused cath.   Chronic back pain    Chronic combined systolic (congestive) and diastolic (congestive) heart failure (HCC)    a. 08/2016 Echo: EF 30-35%, diff HK, mild to mod MR, mild BAE; b. 03/2018 Echo: EF 20-25%, diff HK. Gr1 DD. MIldly dil Ao root. Mild BAE. Mildly reduced RV fxn.   GERD (gastroesophageal reflux disease)    Gout    Hypertension    Obesity    Past Surgical History:  Procedure Laterality Date   EPIDURAL BLOCK INJECTION     chronic back pain    LEFT HEART CATH AND CORONARY ANGIOGRAPHY Left 12/27/2019   Procedure: LEFT HEART CATH AND CORONARY ANGIOGRAPHY;  Surgeon: Laurier Nancy, MD;  Location: [facility] INVASIVE CV LAB;  Service: Cardiovascular;  Laterality: Left;   TONSILLECTOMY     Patient Active Problem List   Diagnosis Date Noted   CHF (congestive heart failure) (HCC) 05/19/2023   PAD (peripheral artery disease) (HCC) 09/02/2022   Acute respiratory failure with hypoxia (HCC) 07/31/2022   CHF exacerbation (HCC) 07/30/2022   Acute HFrEF (heart failure with reduced ejection fraction) (HCC) 07/30/2022   Hypokalemia 07/30/2022   Hyperglycemia 07/30/2022   Elevated troponin 07/30/2022   Obesity    Hypertension     GERD (gastroesophageal reflux disease)    Hyperthyroidism    Acute coronary syndrome (HCC) 11/28/2019    PCP: Sherrie Mustache, MD   REFERRING PROVIDER: Jerrel Ivory, DO  REFERRING DIAG: [phone] (ICD-10-CM) - Other muscle spasm M54.2 (ICD-10-CM) - Cervicalgia M47.812 (ICD-10-CM) - Spondylosis without myelopathy or radiculopathy, cervical region M50.323 (ICD-10-CM) - 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     Hand dominance: Right  PERTINENT HISTORY:  Cervicalgia. Took a medication that started with a T which made him drowsy which helped with the pain. Then was given a muscle relaxer which did not help, then got a shot in is R neck which did not help. He then got a muscle relaxer and also took tylenol with it. Neck pain begain about 2 weeks ago, gradual onset which has worsened since onset, currently a 7-8/10 neck pain currently. Pain is currently on his R posterior lateral neck. Pain wants to travel to the [place].. Shoulder pain is better.   PAIN:  Are you having pain? Yes: NPRS scale: 7-8/10 Pain location: R posterior lateral neck (around levator scapula and R scalene area) Pain description: tight, achy Aggravating factors: R > L cervical rotation Relieving factors: bengay, heating pad  PRECAUTIONS: Hx of Cardiomyopathy, CHF   RED FLAGS: Bowel or bladder incontinence: No and Cauda equina syndrome: No     WEIGHT BEARING RESTRICTIONS: No  FALLS:  Has patient fallen in last 6 months? Yes. Number of falls 1 Fell 5 days ago while sitting on a chair, reaching for the fridge  on wet floor trying to fix the leaking fridge and his chair slipped. No other falls.   LIVING ENVIRONMENT: Lives with: lives with their family Lives in: House/apartment Stairs: Yes: Internal: 15 steps; on left going up and discontinues halfway and External: 4-5 steps; on right going up Has following equipment at home:  First floor set up    OCCUPATION:   PLOF: Independent  PATIENT GOALS: Decrease pain. Make the pain go away.   NEXT MD VISIT: Come back as needed  OBJECTIVE:  Note: Objective measures were completed at Evaluation unless otherwise noted.  DIAGNOSTIC FINDINGS:   X-ray cervical spine 2 to 3 views 07/11/2023 EXAM:  Cervical spine Radiographs - 2 views (AP, lateral) performed 07/11/2023   CLINICAL INFORMATION: Right side neck pain   COMPARISON: None   FINDINGS:   The cervical spine is visualized to the C6-7 intervertebral disc space in  the lateral view.  Mild straightening of the normal cervical lordosis.   Mild disc space narrowing at C6-7.  Mild facet hypertrophic changes worse  from C5-C7.  There are no fractures or dislocations.  No soft tissue  swelling.   PATIENT SURVEYS:  FOTO neck 42  COGNITION: Overall cognitive status: Within functional limits for tasks assessed  SENSATION:   POSTURE:  forward neck, slight L cervical side bend, upper thoracic kyphosis, movement preference around C5/6 area.   PALPATION: TTP L posterior neck around C5/C6 area. Muscle tension R upper trap and levator scapula muscles   CERVICAL ROM:   Active ROM A/PROM (deg) eval  Flexion WFL with reproduction of R posterior lateral neck pain around levator scapular area with overpressure  Extension WFL with reproduction of R posterior lateral neck pain around C5/6 area with overpressure  Right lateral flexion WFL with R lateral neck pain around C5/6 area  Left lateral flexion WFL with R lateral neck stretch.   Right rotation 28 degrees with R lateral neck pain, R lateral  neck tightness  Left rotation 38 degrees with R lateral neck stretch   (Blank rows = not tested)  UPPER EXTREMITY ROM:  Active ROM Right eval Left eval  Shoulder flexion    Shoulder extension    Shoulder abduction    Shoulder adduction    Shoulder extension    Shoulder  internal rotation    Shoulder external rotation    Elbow flexion    Elbow extension    Wrist flexion    Wrist extension    Wrist ulnar deviation    Wrist radial deviation    Wrist pronation    Wrist supination     (Blank rows = not tested)  UPPER EXTREMITY MMT:  MMT Right eval Left eval  Shoulder flexion 4 4-  Shoulder extension    Shoulder abduction    Shoulder adduction    Shoulder extension    Shoulder internal rotation    Shoulder external rotation    Middle trapezius    Lower trapezius seated manually resisted 4 4  Elbow flexion 4 4  Elbow extension 4+ 4  Wrist flexion    Wrist extension 4+ 4+  Wrist ulnar deviation    Wrist radial deviation    Wrist pronation    Wrist supination    Grip strength     (Blank rows = not tested)  CERVICAL SPECIAL TESTS:  Spurling's test: Negative and Distraction test: Positive Decreased pain with cervical distraction    FUNCTIONAL TESTS:    TODAY'S TREATMENT:                                                                                                                              DATE: 08/07/2023   Therapeutic exercise  Seated B scapular retraction 10x5 seconds for 3 sets  First rib stretch with strap   R 30 seconds x 4  Seated manually resisted scapular retraction targeting the lower trap muscle  R 10x5 seconds for 3 sets  Seated chin tucks 8x5 seconds   Seated manually resisted R scapular depression 10x5 seconds      Improved exercise technique, movement at target joints, use of target muscles after mod verbal, visual, tactile cues.    Manual therapy  Seated STM R levator scapula and distal scalene muscle to decrease  tension  Neck feels better afterwards reported.       PATIENT EDUCATION:  Education details: there-ex, HEP, POC Person educated: Patient Education method: Explanation, Demonstration, Tactile cues, Verbal cues, and Handouts Education comprehension: verbalized understanding and returned demonstration  HOME EXERCISE PROGRAM: Access Code: [email or web address] URL: [email or web address] Date: 07/31/2023 Prepared by: Loralyn Freshwater  Exercises - Gentle Levator Scapulae Stretch  - 3 x daily - 7 x weekly - 1 sets - 5 reps - 30 seconds  hold - Seated Scapular Retraction  - 3 x daily - 7 x weekly - 3 sets - 10 reps - 5 seconds hold   ASSESSMENT:  CLINICAL IMPRESSION:  Worked on improving upper cervical extension, lower trap strengthening, and manual techniques to decrease R levator scapula and distal part of scalene muscle tension.  Decreased neck pain reported after manual therapy. Pt will benefit from continued skilled physical therapy services to decrease pain, improve strength and function.         OBJECTIVE IMPAIRMENTS: decreased  ROM, improper body mechanics, postural dysfunction, and pain.   ACTIVITY LIMITATIONS: sleeping and looking around  PARTICIPATION LIMITATIONS:   PERSONAL FACTORS: Age, Fitness, Past/current experiences, and 3+ comorbidities: arthritis, cardiomyopathy, chronic back pain, CHF, HTN  are also affecting patient's functional outcome.   REHAB POTENTIAL: Fair    CLINICAL DECISION MAKING: Evolving/moderate complexity Pain is worsening since onset per pt.   EVALUATION COMPLEXITY: Moderate   GOALS: Goals reviewed with patient? Yes  SHORT TERM GOALS: Target date: 08/25/2023   Pt will be independent with his initial HEP to decrease pain, improve strength and ability to look around and sleep more comfortably.  Baseline: Pt has started his initial HEP (07/31/2023) Goal status: INITIAL   LONG TERM GOALS: Target date: 09/29/2023  Pt will have a decrease  in neck pain to 5/10 or less at worst to promote ability to look around as well as sleep more comfortably.  Baseline: 9-10/10 neck pain at worst for the past 3 weeks. (07/31/2023) Goal status: INITIAL  2.  Pt will improve  B lower trap strength by at least 1/2 MMT to promote ability to look around with less neck pain.  Baseline:  MMT Right eval Left eval  Lower trapezius (seated manually resisted) 4 4  (07/31/2023) Goal status: INITIAL  3.  Pt will improve R and L cervical rotation AROM by at least 10 degrees to promote ability to look around with less difficulty.   Baseline:  Active ROM A/PROM (deg) eval  Right rotation 28 degrees with R lateral neck pain, R lateral neck tightness  Left rotation 38 degrees with R lateral neck stretch  (07/31/2023)  Goal status: INITIAL  4.  Pt will improve his neck FOTO score by at least 10 points as a demonstration of improved function.  Baseline: Neck FOTO 42 (07/31/2023) Goal status: INITIAL     PLAN:  PT FREQUENCY: 2x/week  PT DURATION: 8 weeks  PLANNED INTERVENTIONS: Therapeutic exercises, Therapeutic activity, Neuromuscular re-education, Patient/Family education, Dry Needling, Electrical stimulation, Traction, Manual therapy, and Re-evaluation  PLAN FOR NEXT SESSION: posture, lower trap, anterior cervical strengthening, thoracic extension, manual techniques, modalities PRN    Jahleah Mariscal, PT, DPT 08/07/2023, 11:23 AM

## 2023-08-11 ENCOUNTER — Ambulatory Visit (INDEPENDENT_AMBULATORY_CARE_PROVIDER_SITE_OTHER): Payer: Medicare HMO | Admitting: Cardiovascular Disease

## 2023-08-11 ENCOUNTER — Encounter: Payer: Self-pay | Admitting: Cardiovascular Disease

## 2023-08-11 VITALS — BP 105/85 | HR 109 | Ht 73.0 in | Wt 306.0 lb

## 2023-08-11 DIAGNOSIS — I739 Peripheral vascular disease, unspecified: Secondary | ICD-10-CM

## 2023-08-11 DIAGNOSIS — Z6832 Body mass index (BMI) 32.0-32.9, adult: Secondary | ICD-10-CM

## 2023-08-11 DIAGNOSIS — I5021 Acute systolic (congestive) heart failure: Secondary | ICD-10-CM | POA: Diagnosis not present

## 2023-08-11 DIAGNOSIS — I10 Essential (primary) hypertension: Secondary | ICD-10-CM

## 2023-08-11 DIAGNOSIS — E66811 Obesity, class 1: Secondary | ICD-10-CM

## 2023-08-11 DIAGNOSIS — E6609 Other obesity due to excess calories: Secondary | ICD-10-CM

## 2023-08-11 NOTE — Progress Notes (Signed)
Cardiology Office Note   Date:  08/11/2023   ID:  Ronald Horne, DOB December 11, 1950, MRN [id number]  PCP:  Sherrie Mustache, MD  Cardiologist:  Adrian Blackwater, MD      History of Present Illness: Ronald Horne is a 72 y.o. male who presents for  Chief Complaint  Patient presents with   Follow-up    2 mo    Neck hurts      Past Medical History:  Diagnosis Date   Arthritis    Cardiomyopathy (HCC)    a. 08/2016 Echo: EF 30-35%, diff HK; b. 03/2018 Ex MV ([facility]): Ex time 2:00, large anter defect, EF 24%; c. 03/2018 Echo: EF 20-25%, diff HK. Gr1 DD; d. Refused cath.   Chronic back pain    Chronic combined systolic (congestive) and diastolic (congestive) heart failure (HCC)    a. 08/2016 Echo: EF 30-35%, diff HK, mild to mod MR, mild BAE; b. 03/2018 Echo: EF 20-25%, diff HK. Gr1 DD. MIldly dil Ao root. Mild BAE. Mildly reduced RV fxn.   GERD (gastroesophageal reflux disease)    Gout    Hypertension    Obesity      Past Surgical History:  Procedure Laterality Date   EPIDURAL BLOCK INJECTION     chronic back pain    LEFT HEART CATH AND CORONARY ANGIOGRAPHY Left 12/27/2019   Procedure: LEFT HEART CATH AND CORONARY ANGIOGRAPHY;  Surgeon: Laurier Nancy, MD;  Location: [facility] INVASIVE CV LAB;  Service: Cardiovascular;  Laterality: Left;   TONSILLECTOMY       Current Outpatient Medications  Medication Sig Dispense Refill   acetaminophen (TYLENOL) 325 MG tablet Take 500 mg by mouth every 6 (six) hours as needed for moderate pain or mild pain.     albuterol (VENTOLIN HFA) 108 (90 Base) MCG/ACT inhaler Inhale into the lungs.     ascorbic acid (VITAMIN C) 500 MG tablet Take 500 mg by mouth daily.     aspirin 81 MG tablet Take 1 tablet (81 mg total) by mouth daily. (Patient not taking: Reported on 10/06/2022) 30 tablet 2   atorvastatin (LIPITOR) 10 MG tablet Take 1 tablet (10 mg total) by mouth daily. 30 tablet 2   CORLANOR 7.5 MG TABS tablet Take 7.5 mg by mouth 2 (two) times daily.      dapagliflozin propanediol (FARXIGA) 10 MG TABS tablet TAKE 1 TABLET BY MOUTH DAILY 30 tablet 6   digoxin (LANOXIN) 0.125 MG tablet Take 1 tablet (125 mcg total) by mouth daily. 30 tablet 11   ergocalciferol (VITAMIN D2) 1.25 MG (50000 UT) capsule Take 50,000 Units by mouth once a week. (Patient not taking: Reported on 06/09/2023)     furosemide (LASIX) 40 MG tablet Take 1 tablet (40 mg total) by mouth daily. 90 tablet 3   methimazole (TAPAZOLE) 5 MG tablet Take 7.5 mg by mouth daily.     metoprolol tartrate (LOPRESSOR) 100 MG tablet Take 100 mg by mouth 2 (two) times daily.     Multiple Vitamin (MULTIVITAMIN WITH MINERALS) TABS tablet Take 1 tablet by mouth daily.     sacubitril-valsartan (ENTRESTO) 97-103 MG TAKE 1 TABLET BY MOUTH TWICE A DAY 60 tablet 6   spironolactone (ALDACTONE) 25 MG tablet TAKE 1/2 TABLET BY MOUTH DAILY 15 tablet 6   No current facility-administered medications for this visit.    Allergies:   Patient has no known allergies.    Social History:   reports that he quit smoking about 46 years ago. His smoking  use included cigarettes. He started smoking about 47 years ago. He has a 3 pack-year smoking history. He has never used smokeless tobacco. He reports that he does not drink alcohol and does not use drugs.   Family History:  family history is not on file.    ROS:     Review of Systems  Constitutional: Negative.   HENT: Negative.    Eyes: Negative.   Respiratory: Negative.    Gastrointestinal: Negative.   Genitourinary: Negative.   Musculoskeletal: Negative.   Skin: Negative.   Neurological: Negative.   Endo/Heme/Allergies: Negative.   Psychiatric/Behavioral: Negative.    All other systems reviewed and are negative.     All other systems are reviewed and negative.    PHYSICAL EXAM: VS:  BP 105/85   Pulse (!) 109   Ht 6\' 1"  (1.854 m)   Wt (!) 306 lb (138.8 kg)   SpO2 97%   BMI 40.37 kg/m  , BMI Body mass index is 40.37 kg/m. Last weight:  Wt  Readings from Last 3 Encounters:  08/11/23 (!) 306 lb (138.8 kg)  06/09/23 (!) 304 lb 6.4 oz (138.1 kg)  05/19/23 300 lb (136.1 kg)     Physical Exam Vitals reviewed.  Constitutional:      Appearance: Normal appearance. He is normal weight.  HENT:     Head: Normocephalic.     Nose: Nose normal.     Mouth/Throat:     Mouth: Mucous membranes are moist.  Eyes:     Pupils: Pupils are equal, round, and reactive to light.  Cardiovascular:     Rate and Rhythm: Normal rate and regular rhythm.     Pulses: Normal pulses.     Heart sounds: Normal heart sounds.  Pulmonary:     Effort: Pulmonary effort is normal.  Abdominal:     General: Abdomen is flat. Bowel sounds are normal.  Musculoskeletal:        General: Normal range of motion.     Cervical back: Normal range of motion.  Skin:    General: Skin is warm.  Neurological:     General: No focal deficit present.     Mental Status: He is alert.  Psychiatric:        Mood and Affect: Mood normal.       EKG:   Recent Labs: 05/19/2023: ALT 15; B Natriuretic Peptide 809.8; BUN 20; Creatinine, Ser 1.00; Hemoglobin 15.6; Magnesium 2.3; Platelets 215; Potassium 3.6; Sodium 139    Lipid Panel No results found for: "CHOL", "TRIG", "HDL", "CHOLHDL", "VLDL", "LDLCALC", "LDLDIRECT"    Other studies Reviewed: Additional studies/ records that were reviewed today include:  Review of the above records demonstrates:      04/12/2018   11:13 AM  PAD Screen  Previous PAD dx? No  Previous surgical procedure? No  Pain with walking? No  Feet/toe relief with dangling? No  Painful, non-healing ulcers? No  Extremities discolored? No      ASSESSMENT AND PLAN:    ICD-10-CM   1. PAD (peripheral artery disease) (HCC)  I73.9     2. Primary hypertension  I10     3. Acute HFrEF (heart failure with reduced ejection fraction) (HCC)  I50.21    LVEF 30-35%, wearing cpap, continue entresto, metoprolol    4. Class 1 obesity due to excess  calories with body mass index (BMI) of 32.0 to 32.9 in adult, unspecified whether serious comorbidity present  E66.811    E66.09    Z68.32  Problem List Items Addressed This Visit       Cardiovascular and Mediastinum   Acute HFrEF (heart failure with reduced ejection fraction) (HCC)   Hypertension   PAD (peripheral artery disease) (HCC) - Primary     Other   Obesity       Disposition:   Return in about 5 weeks (around 09/15/2023).    Total time spent: 30 minutes  Signed,  Adrian Blackwater, MD  08/11/2023 9:28 AM    [facility]

## 2023-08-15 ENCOUNTER — Ambulatory Visit: Payer: Medicare HMO

## 2023-08-15 DIAGNOSIS — M542 Cervicalgia: Secondary | ICD-10-CM

## 2023-08-15 NOTE — Therapy (Signed)
OUTPATIENT PHYSICAL THERAPY Treatment   Patient Name: Ronald Horne MRN: [id number] DOB:[birth year], 72 y.o., male Today's Date: 08/15/2023  END OF SESSION:  PT End of Session - 08/15/23 1033     Visit Number 3    Number of Visits 17    Date for PT Re-Evaluation 09/29/23    Authorization - Number of Visits 60    PT Start Time 1033    PT Stop Time 1116    PT Time Calculation (min) 43 min    Activity Tolerance Patient tolerated treatment well    Behavior During Therapy WFL for tasks assessed/performed               Past Medical History:  Diagnosis Date   Arthritis    Cardiomyopathy (HCC)    a. 08/2016 Echo: EF 30-35%, diff HK; b. 03/2018 Ex MV ([facility]): Ex time 2:00, large anter defect, EF 24%; c. 03/2018 Echo: EF 20-25%, diff HK. Gr1 DD; d. Refused cath.   Chronic back pain    Chronic combined systolic (congestive) and diastolic (congestive) heart failure (HCC)    a. 08/2016 Echo: EF 30-35%, diff HK, mild to mod MR, mild BAE; b. 03/2018 Echo: EF 20-25%, diff HK. Gr1 DD. MIldly dil Ao root. Mild BAE. Mildly reduced RV fxn.   GERD (gastroesophageal reflux disease)    Gout    Hypertension    Obesity    Past Surgical History:  Procedure Laterality Date   EPIDURAL BLOCK INJECTION     chronic back pain    LEFT HEART CATH AND CORONARY ANGIOGRAPHY Left 12/27/2019   Procedure: LEFT HEART CATH AND CORONARY ANGIOGRAPHY;  Surgeon: Laurier Nancy, MD;  Location: [facility];  Service: Cardiovascular;  Laterality: Left;   TONSILLECTOMY     Patient Active Problem List   Diagnosis Date Noted   CHF (congestive heart failure) (HCC) 05/19/2023   PAD (peripheral artery disease) (HCC) 09/02/2022   Acute respiratory failure with hypoxia (HCC) 07/31/2022   CHF exacerbation (HCC) 07/30/2022   Acute HFrEF (heart failure with reduced ejection fraction) (HCC) 07/30/2022   Hypokalemia 07/30/2022   Hyperglycemia 07/30/2022   Elevated troponin 07/30/2022   Obesity    Hypertension     GERD (gastroesophageal reflux disease)    Hyperthyroidism    Acute coronary syndrome (HCC) 11/28/2019    PCP: Sherrie Mustache, MD   REFERRING PROVIDER: Jerrel Ivory, DO  REFERRING DIAG: [phone] (ICD-10-CM) - Other muscle spasm M54.2 (ICD-10-CM) - Cervicalgia M47.812 (ICD-10-CM) - Spondylosis without myelopathy or radiculopathy, cervical region M50.323 (ICD-10-CM) - 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 Woke up at 3:15 am and could not get to sleep. Has a lot of people living with him in the same house. 6 adults and more children.     Hand dominance: Right  PERTINENT HISTORY:  Cervicalgia. Took a medication that started with a T which made him drowsy which helped with the pain. Then was given a muscle relaxer which did not help, then got a shot in is R neck which did not help. He then got a muscle relaxer and also took tylenol with it. Neck pain begain about 2 weeks ago, gradual onset which has worsened since onset, currently a 7-8/10 neck pain currently. Pain is currently on his R posterior lateral neck. Pain [place] to [place] to the [place] Shoulder pain is better.   PAIN:  Are you having pain? Yes: NPRS scale: 7-8/10 Pain location: R posterior lateral neck (around levator scapula and R scalene area) Pain description: tight, achy Aggravating factors: R > L cervical rotation Relieving factors: bengay, heating pad  PRECAUTIONS: Hx of Cardiomyopathy, CHF   RED FLAGS: Bowel or bladder incontinence: No and Cauda equina syndrome: No     WEIGHT BEARING RESTRICTIONS:  No  FALLS:  Has patient fallen in last 6 months? Yes. Number of falls 1 Fell 5 days ago while sitting on a chair, reaching for the fridge on wet floor trying to fix the leaking fridge and his chair slipped. No other falls.   LIVING ENVIRONMENT: Lives with: lives with their family Lives in: House/apartment Stairs: Yes: Internal: 15 steps; on left going up and discontinues halfway and External: 4-5 steps; on right going up Has following equipment at home:  First floor set up    OCCUPATION:   PLOF: Independent  PATIENT GOALS: Decrease pain. Make the pain go away.   NEXT MD VISIT: Come back as needed  OBJECTIVE:  Note: Objective measures were completed at Evaluation unless otherwise noted.  DIAGNOSTIC FINDINGS:   X-ray cervical spine 2 to 3 views 07/11/2023 EXAM:  Cervical spine Radiographs - 2 views (AP, lateral) performed 07/11/2023   CLINICAL INFORMATION: Right side neck pain   COMPARISON: None   FINDINGS:   The cervical spine is visualized to the C6-7 intervertebral disc space in  the lateral view.  Mild straightening of the normal cervical lordosis.   Mild disc space narrowing at C6-7.  Mild facet hypertrophic changes worse  from C5-C7.  There are no fractures or dislocations.  No soft tissue  swelling.   PATIENT SURVEYS:  FOTO neck 42  COGNITION: Overall cognitive status: Within functional limits for tasks assessed  SENSATION:   POSTURE:  forward neck, slight L cervical side bend, upper thoracic kyphosis, movement preference around C5/6 area.   PALPATION: TTP L posterior neck around C5/C6 area. Muscle tension R upper trap and levator scapula muscles   CERVICAL ROM:   Active ROM A/PROM (deg) eval  Flexion WFL with reproduction of R posterior lateral neck pain around levator scapular area with overpressure  Extension WFL with reproduction of R posterior lateral neck pain around C5/6 area with overpressure  Right lateral flexion WFL with R lateral neck pain  around C5/6 area  Left lateral flexion WFL with R lateral neck stretch.   Right rotation 28 degrees with R lateral neck pain, R lateral neck tightness  Left rotation 38 degrees with R lateral neck stretch   (Blank rows = not tested)  UPPER EXTREMITY ROM:  Active ROM Right eval Left  eval  Shoulder flexion    Shoulder extension    Shoulder abduction    Shoulder adduction    Shoulder extension    Shoulder internal rotation    Shoulder external rotation    Elbow flexion    Elbow extension    Wrist flexion    Wrist extension    Wrist ulnar deviation    Wrist radial deviation    Wrist pronation    Wrist supination     (Blank rows = not tested)  UPPER EXTREMITY MMT:  MMT Right eval Left eval  Shoulder flexion 4 4-  Shoulder extension    Shoulder abduction    Shoulder adduction    Shoulder extension    Shoulder internal rotation    Shoulder external rotation    Middle trapezius    Lower trapezius seated manually resisted 4 4  Elbow flexion 4 4  Elbow extension 4+ 4  Wrist flexion    Wrist extension 4+ 4+  Wrist ulnar deviation    Wrist radial deviation    Wrist pronation    Wrist supination    Grip strength     (Blank rows = not tested)  CERVICAL SPECIAL TESTS:  Spurling's test: Negative and Distraction test: Positive Decreased pain with cervical distraction    FUNCTIONAL TESTS:    TODAY'S TREATMENT:                                                                                                                              DATE: 08/15/2023   Therapeutic exercise  Reclined  Cervical nod 10x3 with 5 second holds   B scapular retraction 10x5 seconds for 3 sets    Then with R shoulder flexion 5x3 comfortable range with PT assist for mechanics.    Cervical nod with R and L rotation 5x6 each direction    Max verbal, tactile and visual cues to decrease side bend compensation with cervical rotation.   Seated B scapular retraction 10x5 seconds   Tactile  and verbal cues for proper movement.    Improved exercise technique, movement at target joints, use of target muscles after mod verbal, visual, tactile cues.      PATIENT EDUCATION:  Education details: there-ex, HEP, POC Person educated: Patient Education method: Explanation, Demonstration, Tactile cues, Verbal cues, and Handouts Education comprehension: verbalized understanding and returned demonstration  HOME EXERCISE PROGRAM: Access Code: [email or web address] URL: [email or web address] Date: 07/31/2023 Prepared by: Loralyn Freshwater  Exercises - Gentle Levator Scapulae Stretch  - 3 x daily - 7 x weekly - 1 sets - 5 reps - 30 seconds  hold - Seated Scapular Retraction  - 3 x daily - 7 x weekly - 3 sets - 10 reps - 5 seconds hold   ASSESSMENT:  CLINICAL IMPRESSION:  Worked on improving anterior cervical and lower trap muscle activation as well as promoting proper mechanics with cervical rotation. Pt demonstrates cervical side bend compensation when turning his head, needing max cues  to perform properly. Decrease R lateral cervical tension reported after treatment. Pt will benefit from continued skilled physical therapy services to decrease pain, improve strength and function.         OBJECTIVE IMPAIRMENTS: decreased ROM, improper body mechanics, postural dysfunction, and pain.   ACTIVITY LIMITATIONS: sleeping and looking around  PARTICIPATION LIMITATIONS:   PERSONAL FACTORS: Age, Fitness, Past/current experiences, and 3+ comorbidities: arthritis, cardiomyopathy, chronic back pain, CHF, HTN  are also affecting patient's functional outcome.   REHAB POTENTIAL: Fair    CLINICAL DECISION MAKING: Evolving/moderate complexity Pain is worsening since onset per pt.   EVALUATION COMPLEXITY: Moderate   GOALS: Goals reviewed with patient? Yes  SHORT TERM GOALS: Target date: 08/25/2023   Pt will be independent with his initial HEP to decrease pain, improve strength and  ability to look around and sleep more comfortably.  Baseline: Pt has started his initial HEP (07/31/2023) Goal status: INITIAL   LONG TERM GOALS: Target date: 09/29/2023  Pt will have a decrease in neck pain to 5/10 or less at worst to promote ability to look around as well as sleep more comfortably.  Baseline: 9-10/10 neck pain at worst for the past 3 weeks. (07/31/2023) Goal status: INITIAL  2.  Pt will improve  B lower trap strength by at least 1/2 MMT to promote ability to look around with less neck pain.  Baseline:  MMT Right eval Left eval  Lower trapezius (seated manually resisted) 4 4  (07/31/2023) Goal status: INITIAL  3.  Pt will improve R and L cervical rotation AROM by at least 10 degrees to promote ability to look around with less difficulty.   Baseline:  Active ROM A/PROM (deg) eval  Right rotation 28 degrees with R lateral neck pain, R lateral neck tightness  Left rotation 38 degrees with R lateral neck stretch  (07/31/2023)  Goal status: INITIAL  4.  Pt will improve his neck FOTO score by at least 10 points as a demonstration of improved function.  Baseline: Neck FOTO 42 (07/31/2023) Goal status: INITIAL     PLAN:  PT FREQUENCY: 2x/week  PT DURATION: 8 weeks  PLANNED INTERVENTIONS: Therapeutic exercises, Therapeutic activity, Neuromuscular re-education, Patient/Family education, Dry Needling, Electrical stimulation, Traction, Manual therapy, and Re-evaluation  PLAN FOR NEXT SESSION: posture, lower trap, anterior cervical strengthening, thoracic extension, manual techniques, modalities PRN    Elver Stadler, PT, DPT 08/15/2023, 11:28 AM

## 2023-08-22 ENCOUNTER — Ambulatory Visit: Payer: Medicare HMO

## 2023-08-22 DIAGNOSIS — M542 Cervicalgia: Secondary | ICD-10-CM | POA: Diagnosis not present

## 2023-08-22 NOTE — Therapy (Signed)
OUTPATIENT PHYSICAL THERAPY Treatment   Patient Name: Ronald Horne MRN: [id number] DOB:[birth year], 72 y.o., male Today's Date: 08/22/2023  END OF SESSION:  PT End of Session - 08/22/23 1032     Visit Number 4    Number of Visits 17    Date for PT Re-Evaluation 09/29/23    Authorization - Number of Visits 60    PT Start Time 1032    PT Stop Time 1114    PT Time Calculation (min) 42 min    Activity Tolerance Patient tolerated treatment well    Behavior During Therapy WFL for tasks assessed/performed                Past Medical History:  Diagnosis Date   Arthritis    Cardiomyopathy (HCC)    a. 08/2016 Echo: EF 30-35%, diff HK; b. 03/2018 Ex MV ([facility]): Ex time 2:00, large anter defect, EF 24%; c. 03/2018 Echo: EF 20-25%, diff HK. Gr1 DD; d. Refused cath.   Chronic back pain    Chronic combined systolic (congestive) and diastolic (congestive) heart failure (HCC)    a. 08/2016 Echo: EF 30-35%, diff HK, mild to mod MR, mild BAE; b. 03/2018 Echo: EF 20-25%, diff HK. Gr1 DD. MIldly dil Ao root. Mild BAE. Mildly reduced RV fxn.   GERD (gastroesophageal reflux disease)    Gout    Hypertension    Obesity    Past Surgical History:  Procedure Laterality Date   EPIDURAL BLOCK INJECTION     chronic back pain    LEFT HEART CATH AND CORONARY ANGIOGRAPHY Left 12/27/2019   Procedure: LEFT HEART CATH AND CORONARY ANGIOGRAPHY;  Surgeon: Laurier Nancy, MD;  Location: [facility];  Service: Cardiovascular;  Laterality: Left;   TONSILLECTOMY     Patient Active Problem List   Diagnosis Date Noted   CHF (congestive heart failure) (HCC) 05/19/2023   PAD (peripheral artery disease) (HCC) 09/02/2022   Acute respiratory failure with hypoxia (HCC) 07/31/2022   CHF exacerbation (HCC) 07/30/2022   Acute HFrEF (heart failure with reduced ejection fraction) (HCC) 07/30/2022   Hypokalemia 07/30/2022   Hyperglycemia 07/30/2022   Elevated troponin 07/30/2022   Obesity    Hypertension     GERD (gastroesophageal reflux disease)    Hyperthyroidism    Acute coronary syndrome (HCC) 11/28/2019    PCP: Sherrie Mustache, MD   REFERRING PROVIDER: Jerrel Ivory, DO  REFERRING DIAG: [phone] (ICD-10-CM) - Other muscle spasm M54.2 (ICD-10-CM) - Cervicalgia M47.812 (ICD-10-CM) - Spondylosis without myelopathy or radiculopathy, cervical region M50.323 (ICD-10-CM) - 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 8/10 pain currently R (with R and L cervical rotation). One day he thought his pain went completely away until he turned his head.    Hand dominance: Right  PERTINENT HISTORY:  Cervicalgia. Took a medication that started with a T which made him drowsy which helped with the pain. Then was given a muscle relaxer which did not help, then got a shot in is R neck which did not help. He then got a muscle relaxer and also took tylenol with it. Neck pain begain about 2 weeks ago, gradual onset which has worsened since onset, currently a 7-8/10 neck pain currently. Pain is currently on his R posterior lateral neck. Pain wants to travel to the [place] Shoulder pain is better.   PAIN:  Are you having pain? Yes: NPRS scale: 7-8/10 Pain location: R posterior lateral neck (around levator scapula and R scalene area) Pain description: tight, achy Aggravating factors: R > L cervical rotation Relieving factors: bengay, heating pad  PRECAUTIONS: Hx of Cardiomyopathy, CHF   RED FLAGS: Bowel or bladder incontinence: No and Cauda equina syndrome: No     WEIGHT BEARING RESTRICTIONS: No  FALLS:  Has  patient fallen in last 6 months? Yes. Number of falls 1 Fell 5 days ago while sitting on a chair, reaching for the fridge on wet floor trying to fix the leaking fridge and his chair slipped. No other falls.   LIVING ENVIRONMENT: Lives with: lives with their family Lives in: House/apartment Stairs: Yes: Internal: 15 steps; on left going up and discontinues halfway and External: 4-5 steps; on right going up Has following equipment at home:  First floor set up    OCCUPATION:   PLOF: Independent  PATIENT GOALS: Decrease pain. Make the pain go away.   NEXT MD VISIT: Come back as needed  OBJECTIVE:  Note: Objective measures were completed at Evaluation unless otherwise noted.  DIAGNOSTIC FINDINGS:   X-ray cervical spine 2 to 3 views 07/11/2023 EXAM:  Cervical spine Radiographs - 2 views (AP, lateral) performed 07/11/2023   CLINICAL INFORMATION: Right side neck pain   COMPARISON: None   FINDINGS:   The cervical spine is visualized to the C6-7 intervertebral disc space in  the lateral view.  Mild straightening of the normal cervical lordosis.   Mild disc space narrowing at C6-7.  Mild facet hypertrophic changes worse  from C5-C7.  There are no fractures or dislocations.  No soft tissue  swelling.   PATIENT SURVEYS:  FOTO neck 42  COGNITION: Overall cognitive status: Within functional limits for tasks assessed  SENSATION:   POSTURE:  forward neck, slight L cervical side bend, upper thoracic kyphosis, movement preference around C5/6 area.   PALPATION: TTP L posterior neck around C5/C6 area. Muscle tension R upper trap and levator scapula muscles   CERVICAL ROM:   Active ROM A/PROM (deg) eval  Flexion WFL with reproduction of R posterior lateral neck pain around levator scapular area with overpressure  Extension WFL with reproduction of R posterior lateral neck pain around C5/6 area with overpressure  Right lateral flexion WFL with R lateral neck pain around C5/6 area   Left lateral flexion WFL with R lateral neck stretch.   Right rotation 28 degrees with R lateral neck pain, R lateral neck tightness  Left rotation 38 degrees with R lateral neck stretch   (Blank rows = not tested)  UPPER EXTREMITY ROM:  Active ROM Right eval Left eval  Shoulder flexion  Shoulder extension    Shoulder abduction    Shoulder adduction    Shoulder extension    Shoulder internal rotation    Shoulder external rotation    Elbow flexion    Elbow extension    Wrist flexion    Wrist extension    Wrist ulnar deviation    Wrist radial deviation    Wrist pronation    Wrist supination     (Blank rows = not tested)  UPPER EXTREMITY MMT:  MMT Right eval Left eval  Shoulder flexion 4 4-  Shoulder extension    Shoulder abduction    Shoulder adduction    Shoulder extension    Shoulder internal rotation    Shoulder external rotation    Middle trapezius    Lower trapezius seated manually resisted 4 4  Elbow flexion 4 4  Elbow extension 4+ 4  Wrist flexion    Wrist extension 4+ 4+  Wrist ulnar deviation    Wrist radial deviation    Wrist pronation    Wrist supination    Grip strength     (Blank rows = not tested)  CERVICAL SPECIAL TESTS:  Spurling's test: Negative and Distraction test: Positive Decreased pain with cervical distraction    FUNCTIONAL TESTS:    TODAY'S TREATMENT:                                                                                                                              DATE: 08/22/2023   Therapeutic exercise  Reclined  Cervical nod 10x3 with 5 second holds   B scapular retraction 10x5 seconds for 3 sets    Then with R shoulder flexion 5x3 comfortable range with PT assist for mechanics.    Cervical nod with R and L rotation 10x3 each direction      Seated B scapular retraction 10x5 seconds for 2 sets  Tactile and verbal cues for proper movement.   Mod verbal cues needed to help pt stay focused on doing  exercises.   Improved exercise technique, movement at target joints, use of target muscles after mod verbal, visual, tactile cues.      PATIENT EDUCATION:  Education details: there-ex, HEP, POC Person educated: Patient Education method: Explanation, Demonstration, Tactile cues, Verbal cues, and Handouts Education comprehension: verbalized understanding and returned demonstration  HOME EXERCISE PROGRAM: Access Code: [email or web address] URL: [email or web address] Date: 07/31/2023 Prepared by: Loralyn Freshwater  Exercises - Gentle Levator Scapulae Stretch  - 3 x daily - 7 x weekly - 1 sets - 5 reps - 30 seconds  hold - Seated Scapular Retraction  - 3 x daily - 7 x weekly - 3 sets - 10 reps - 5 seconds hold   ASSESSMENT:  CLINICAL IMPRESSION:  Continued working on improving anterior cervical and B lower and middle trap strengthening, as well as promoting proper cervical mechanics with decreased cervical extension during rotation to decrease stress to cervical spine. Decreased R lateral neck tension and  pain reported  after session. Decreased R lateral neck tension with R cervical rotation with cues to decrease cervical extension. Pt will benefit from continued skilled physical therapy services to decrease pain, improve strength and function.         OBJECTIVE IMPAIRMENTS: decreased ROM, improper body mechanics, postural dysfunction, and pain.   ACTIVITY LIMITATIONS: sleeping and looking around  PARTICIPATION LIMITATIONS:   PERSONAL FACTORS: Age, Fitness, Past/current experiences, and 3+ comorbidities: arthritis, cardiomyopathy, chronic back pain, CHF, HTN  are also affecting patient's functional outcome.   REHAB POTENTIAL: Fair    CLINICAL DECISION MAKING: Evolving/moderate complexity Pain is worsening since onset per pt.   EVALUATION COMPLEXITY: Moderate   GOALS: Goals reviewed with patient? Yes  SHORT TERM GOALS: Target date: 08/25/2023   Pt will be independent with  his initial HEP to decrease pain, improve strength and ability to look around and sleep more comfortably.  Baseline: Pt has started his initial HEP (07/31/2023) Goal status: INITIAL   LONG TERM GOALS: Target date: 09/29/2023  Pt will have a decrease in neck pain to 5/10 or less at worst to promote ability to look around as well as sleep more comfortably.  Baseline: 9-10/10 neck pain at worst for the past 3 weeks. (07/31/2023) Goal status: INITIAL  2.  Pt will improve  B lower trap strength by at least 1/2 MMT to promote ability to look around with less neck pain.  Baseline:  MMT Right eval Left eval  Lower trapezius (seated manually resisted) 4 4  (07/31/2023) Goal status: INITIAL  3.  Pt will improve R and L cervical rotation AROM by at least 10 degrees to promote ability to look around with less difficulty.   Baseline:  Active ROM A/PROM (deg) eval  Right rotation 28 degrees with R lateral neck pain, R lateral neck tightness  Left rotation 38 degrees with R lateral neck stretch  (07/31/2023)  Goal status: INITIAL  4.  Pt will improve his neck FOTO score by at least 10 points as a demonstration of improved function.  Baseline: Neck FOTO 42 (07/31/2023) Goal status: INITIAL     PLAN:  PT FREQUENCY: 2x/week  PT DURATION: 8 weeks  PLANNED INTERVENTIONS: Therapeutic exercises, Therapeutic activity, Neuromuscular re-education, Patient/Family education, Dry Needling, Electrical stimulation, Traction, Manual therapy, and Re-evaluation  PLAN FOR NEXT SESSION: posture, lower trap, anterior cervical strengthening, thoracic extension, manual techniques, modalities PRN    Joely Losier, PT, DPT 08/22/2023, 11:25 AM

## 2023-08-29 ENCOUNTER — Ambulatory Visit: Payer: Medicare HMO | Admitting: Physical Therapy

## 2023-08-29 ENCOUNTER — Encounter: Payer: Self-pay | Admitting: Physical Therapy

## 2023-08-29 DIAGNOSIS — M25512 Pain in left shoulder: Secondary | ICD-10-CM

## 2023-08-29 DIAGNOSIS — M542 Cervicalgia: Secondary | ICD-10-CM

## 2023-08-29 DIAGNOSIS — M25511 Pain in right shoulder: Secondary | ICD-10-CM

## 2023-08-29 NOTE — Therapy (Signed)
OUTPATIENT PHYSICAL THERAPY Treatment   Patient Name: Ronald Horne MRN: [id number] DOB:[birth year], 72 y.o., male Today's Date: 08/29/2023  END OF SESSION:  PT End of Session - 08/29/23 1041     Visit Number 5    Number of Visits 17    Date for PT Re-Evaluation 09/29/23    Authorization Type --    Authorization Time Period Auth 20 visits 9/30-12/6    Authorization - Visit Number 5    Authorization - Number of Visits 60    Progress Note Due on Visit 10    PT Start Time 1035    PT Stop Time 1115    PT Time Calculation (min) 40 min    Activity Tolerance Patient tolerated treatment well    Behavior During Therapy [facility] for tasks assessed/performed                Past Medical History:  Diagnosis Date   Arthritis    Cardiomyopathy (HCC)    a. 08/2016 Echo: EF 30-35%, diff HK; b. 03/2018 Ex MV (Jadali): Ex time 2:00, large anter defect, EF 24%; c. 03/2018 Echo: EF 20-25%, diff HK. Gr1 DD; d. Refused cath.   Chronic back pain    Chronic combined systolic (congestive) and diastolic (congestive) heart failure (HCC)    a. 08/2016 Echo: EF 30-35%, diff HK, mild to mod MR, mild BAE; b. 03/2018 Echo: EF 20-25%, diff HK. Gr1 DD. MIldly dil Ao root. Mild BAE. Mildly reduced RV fxn.   GERD (gastroesophageal reflux disease)    Gout    Hypertension    Obesity    Past Surgical History:  Procedure Laterality Date   EPIDURAL BLOCK INJECTION     chronic back pain    LEFT HEART CATH AND CORONARY ANGIOGRAPHY Left 12/27/2019   Procedure: LEFT HEART CATH AND CORONARY ANGIOGRAPHY;  Surgeon: Laurier Nancy, MD;  Location: [facility] INVASIVE CV LAB;  Service: Cardiovascular;  Laterality: Left;   TONSILLECTOMY     Patient Active Problem List   Diagnosis Date Noted   CHF (congestive heart failure) (HCC) 05/19/2023   PAD (peripheral artery disease) (HCC) 09/02/2022   Acute respiratory failure with hypoxia (HCC) 07/31/2022   CHF exacerbation (HCC) 07/30/2022   Acute HFrEF (heart failure with reduced  ejection fraction) (HCC) 07/30/2022   Hypokalemia 07/30/2022   Hyperglycemia 07/30/2022   Elevated troponin 07/30/2022   Obesity    Hypertension    GERD (gastroesophageal reflux disease)    Hyperthyroidism    Acute coronary syndrome (HCC) 11/28/2019    PCP: Sherrie Mustache, MD   REFERRING PROVIDER: Jerrel Ivory, DO  REFERRING DIAG: [phone] (ICD-10-CM) - Other muscle spasm M54.2 (ICD-10-CM) - Cervicalgia M47.812 (ICD-10-CM) - Spondylosis without myelopathy or radiculopathy, cervical region M50.323 (ICD-10-CM) - 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     Hand dominance: Right  PERTINENT HISTORY:  Cervicalgia. Took a medication that started with a T which made him drowsy which helped with the pain. Then was given a muscle relaxer which did not help, then got a shot in is R neck which did not help. He then got a muscle relaxer and also took tylenol with it. Neck pain begain about 2 weeks ago, gradual onset which has worsened since onset, currently a 7-8/10 neck pain currently. Pain is currently on his R posterior lateral neck. Pain wants to travel to the [place] Shoulder pain is better.   PAIN:  Are you having pain? Yes: NPRS scale: 7/10 Pain location: R posterior lateral neck (around levator scapula and R scalene area) Pain description: tight, achy Aggravating factors: R > L cervical rotation Relieving factors: bengay, heating pad  PRECAUTIONS: Hx  of Cardiomyopathy, CHF   RED FLAGS: Bowel or bladder incontinence: No and Cauda equina syndrome: No     WEIGHT BEARING RESTRICTIONS: No  FALLS:  Has patient fallen in last 6 months? Yes. Number of falls 1 Fell 5 days ago while sitting on a chair, reaching for the fridge on wet floor trying to fix the leaking fridge and his chair slipped. No other falls.   LIVING ENVIRONMENT: Lives with: lives with their family Lives in: House/apartment Stairs: Yes: Internal: 15 steps; on left going up and discontinues halfway and External: 4-5 steps; on right going up Has following equipment at home:  First floor set up    OCCUPATION:   PLOF: Independent  PATIENT GOALS: Decrease pain. Make the pain go away.   NEXT MD VISIT: Come back as needed  OBJECTIVE:  Note: Objective measures were completed at Evaluation unless otherwise noted.  DIAGNOSTIC FINDINGS:   X-ray cervical spine 2 to 3 views 07/11/2023 EXAM:  Cervical spine Radiographs - 2 views (AP, lateral) performed 07/11/2023   CLINICAL INFORMATION: Right side neck pain   COMPARISON: None   FINDINGS:   The cervical spine is visualized to the C6-7 intervertebral disc space in  the lateral view.  Mild straightening of the normal cervical lordosis.   Mild disc space narrowing at C6-7.  Mild facet hypertrophic changes worse  from C5-C7.  There are no fractures or dislocations.  No soft tissue  swelling.   PATIENT SURVEYS:  FOTO neck 42  COGNITION: Overall cognitive status: Within functional limits for tasks assessed  SENSATION:   POSTURE:  forward neck, slight L cervical side bend, upper thoracic kyphosis, movement preference around C5/6 area.   PALPATION: TTP L posterior neck around C5/C6 area. Muscle tension R upper trap and levator scapula muscles   CERVICAL ROM:   Active ROM A/PROM (deg) eval  Flexion WFL with reproduction of R posterior lateral neck pain around levator scapular area with overpressure  Extension WFL  with reproduction of R posterior lateral neck pain around C5/6 area with overpressure  Right lateral flexion WFL with R lateral neck pain around C5/6 area  Left lateral flexion WFL with R lateral neck stretch.   Right rotation 28 degrees with R lateral neck pain, R lateral neck tightness  Left rotation 38 degrees with R lateral neck stretch   (Blank rows = not tested)  UPPER EXTREMITY ROM:  Active ROM Right eval Left eval  Shoulder flexion    Shoulder extension    Shoulder abduction    Shoulder adduction  Shoulder extension    Shoulder internal rotation    Shoulder external rotation    Elbow flexion    Elbow extension    Wrist flexion    Wrist extension    Wrist ulnar deviation    Wrist radial deviation    Wrist pronation    Wrist supination     (Blank rows = not tested)  UPPER EXTREMITY MMT:  MMT Right eval Left eval  Shoulder flexion 4 4-  Shoulder extension    Shoulder abduction    Shoulder adduction    Shoulder extension    Shoulder internal rotation    Shoulder external rotation    Middle trapezius    Lower trapezius seated manually resisted 4 4  Elbow flexion 4 4  Elbow extension 4+ 4  Wrist flexion    Wrist extension 4+ 4+  Wrist ulnar deviation    Wrist radial deviation    Wrist pronation    Wrist supination    Grip strength     (Blank rows = not tested)  CERVICAL SPECIAL TESTS:  Spurling's test: Negative and Distraction test: Positive Decreased pain with cervical distraction    FUNCTIONAL TESTS:    TODAY'S TREATMENT:                                                                                                                              DATE:   08/29/23: THEREX  Seated UBE 2.5 min forward and 2.5 min backward   NMR  OMEGA Seated Rows #10 3  x 10  -mod VC for increased rhomboid activation by keeping elbows straight  -TC at center of shoulder blades for increased scapular squeezes  Seated Cervical Retraction against red band 1 x 10   -Pt shows difficulty sequencing exercise  Supine Cervical Retraction into towel with 3 sec hold 1 x 10  -Pt show improved sequencing and execution of exercise     08/22/2023   Therapeutic exercise  Reclined  Cervical nod 10x3 with 5 second holds   B scapular retraction 10x5 seconds for 3 sets    Then with R shoulder flexion 5x3 comfortable range with PT assist for mechanics.    Cervical nod with R and L rotation 10x3 each direction      Seated B scapular retraction 10x5 seconds for 2 sets  Tactile and verbal cues for proper movement.   Mod verbal cues needed to help pt stay focused on doing exercises.   Improved exercise technique, movement at target joints, use of target muscles after mod verbal, visual, tactile cues.      PATIENT EDUCATION:  Education details: there-ex, HEP, POC Person educated: Patient Education method: Explanation, Demonstration, Tactile cues, Verbal cues, and Handouts Education comprehension: verbalized understanding and returned demonstration  HOME EXERCISE PROGRAM: Access Code: [email or web address] URL: [email or web address] Date: 08/29/2023 Prepared by: Ellin Goodie  Exercises - Gentle Levator Scapulae Stretch  - 3 x daily - 7 x weekly - 1 sets -  5 reps - 30 seconds  hold - Seated Scapular Retraction  - 3 x daily - 7 x weekly - 3 sets - 10 reps - 5 seconds hold - Supine Cervical Retraction with Towel  - 1 x daily - 2 sets - 10 reps - 3 sec  hold  ASSESSMENT:  CLINICAL IMPRESSION: Pt shows limitation with sequencing exercises with increased upper trap activation for compensation. Pt was able to perform exercises correctly after mod visual and tactile cueing with use of external cueing. He was not limited by pain throughout the exercises. Pt will benefit from continued skilled physical therapy services to decrease pain, improve strength and function.    OBJECTIVE IMPAIRMENTS: decreased ROM, improper body mechanics, postural dysfunction,  and pain.   ACTIVITY LIMITATIONS: sleeping and looking around  PARTICIPATION LIMITATIONS:   PERSONAL FACTORS: Age, Fitness, Past/current experiences, and 3+ comorbidities: arthritis, cardiomyopathy, chronic back pain, CHF, HTN  are also affecting patient's functional outcome.   REHAB POTENTIAL: Fair    CLINICAL DECISION MAKING: Evolving/moderate complexity Pain is worsening since onset per pt.   EVALUATION COMPLEXITY: Moderate   GOALS: Goals reviewed with patient? Yes  SHORT TERM GOALS: Target date: 08/25/2023   Pt will be independent with his initial HEP to decrease pain, improve strength and ability to look around and sleep more comfortably.  Baseline: Pt has started his initial HEP (07/31/2023) Goal status: INITIAL   LONG TERM GOALS: Target date: 09/29/2023  Pt will have a decrease in neck pain to 5/10 or less at worst to promote ability to look around as well as sleep more comfortably.  Baseline: 9-10/10 neck pain at worst for the past 3 weeks. (07/31/2023) Goal status: INITIAL  2.  Pt will improve  B lower trap strength by at least 1/2 MMT to promote ability to look around with less neck pain.  Baseline:  MMT Right eval Left eval  Lower trapezius (seated manually resisted) 4 4  (07/31/2023) Goal status: INITIAL  3.  Pt will improve R and L cervical rotation AROM by at least 10 degrees to promote ability to look around with less difficulty.   Baseline:  Active ROM A/PROM (deg) eval  Right rotation 28 degrees with R lateral neck pain, R lateral neck tightness  Left rotation 38 degrees with R lateral neck stretch  (07/31/2023)  Goal status: INITIAL  4.  Pt will improve his neck FOTO score by at least 10 points as a demonstration of improved function.  Baseline: Neck FOTO 42 (07/31/2023) Goal status: INITIAL     PLAN:  PT FREQUENCY: 2x/week  PT DURATION: 8 weeks  PLANNED INTERVENTIONS: Therapeutic exercises, Therapeutic activity, Neuromuscular  re-education, Patient/Family education, Dry Needling, Electrical stimulation, Traction, Manual therapy, and Re-evaluation  PLAN FOR NEXT SESSION: Continue with posture, lower trap, anterior cervical strengthening, thoracic extension, manual techniques, modalities PRN  Ellin Goodie PT, DPT  [facility] [address], [place], [address] Phone: [phone]   Fax:  [phone]

## 2023-09-04 ENCOUNTER — Encounter: Payer: Self-pay | Admitting: Cardiovascular Disease

## 2023-09-04 ENCOUNTER — Ambulatory Visit (INDEPENDENT_AMBULATORY_CARE_PROVIDER_SITE_OTHER): Payer: Medicare HMO | Admitting: Cardiovascular Disease

## 2023-09-04 VITALS — BP 100/70 | HR 117 | Ht 73.0 in | Wt 306.0 lb

## 2023-09-04 DIAGNOSIS — I5021 Acute systolic (congestive) heart failure: Secondary | ICD-10-CM

## 2023-09-04 DIAGNOSIS — I249 Acute ischemic heart disease, unspecified: Secondary | ICD-10-CM | POA: Diagnosis not present

## 2023-09-04 DIAGNOSIS — I10 Essential (primary) hypertension: Secondary | ICD-10-CM | POA: Diagnosis not present

## 2023-09-04 DIAGNOSIS — R0602 Shortness of breath: Secondary | ICD-10-CM

## 2023-09-04 DIAGNOSIS — I5020 Unspecified systolic (congestive) heart failure: Secondary | ICD-10-CM

## 2023-09-04 DIAGNOSIS — I739 Peripheral vascular disease, unspecified: Secondary | ICD-10-CM | POA: Diagnosis not present

## 2023-09-04 DIAGNOSIS — R000 Tachycardia, unspecified: Secondary | ICD-10-CM

## 2023-09-04 NOTE — Progress Notes (Signed)
Cardiology Office Note   Date:  09/04/2023   ID:  Verle Wheeling, DOB 1950-12-23, MRN [id number]  PCP:  Sherrie Mustache, MD  Cardiologist:  Adrian Blackwater, MD      History of Present Illness: Ronald Horne is a 72 y.o. male who presents for No chief complaint on file.   Shortness of Breath This is a recurrent problem. The current episode started more than 1 month ago. The problem has been rapidly worsening.      Past Medical History:  Diagnosis Date   Arthritis    Cardiomyopathy (HCC)    a. 08/2016 Echo: EF 30-35%, diff HK; b. 03/2018 Ex MV ([facility]adali): Ex time 2:00, large anter defect, EF 24%; c. 03/2018 Echo: EF 20-25%, diff HK. Gr1 DD; d. Refused cath.   Chronic back pain    Chronic combined systolic (congestive) and diastolic (congestive) heart failure (HCC)    a. 08/2016 Echo: EF 30-35%, diff HK, mild to mod MR, mild BAE; b. 03/2018 Echo: EF 20-25%, diff HK. Gr1 DD. MIldly dil Ao root. Mild BAE. Mildly reduced RV fxn.   GERD (gastroesophageal reflux disease)    Gout    Hypertension    Obesity      Past Surgical History:  Procedure Laterality Date   EPIDURAL BLOCK INJECTION     chronic back pain    LEFT HEART CATH AND CORONARY ANGIOGRAPHY Left 12/27/2019   Procedure: LEFT HEART CATH AND CORONARY ANGIOGRAPHY;  Surgeon: Laurier Nancy, MD;  Location: [facility] INVASIVE CV LAB;  Service: Cardiovascular;  Laterality: Left;   TONSILLECTOMY       Current Outpatient Medications  Medication Sig Dispense Refill   acetaminophen (TYLENOL) 325 MG tablet Take 500 mg by mouth every 6 (six) hours as needed for moderate pain or mild pain.     albuterol (VENTOLIN HFA) 108 (90 Base) MCG/ACT inhaler Inhale into the lungs.     ascorbic acid (VITAMIN C) 500 MG tablet Take 500 mg by mouth daily.     aspirin 81 MG tablet Take 1 tablet (81 mg total) by mouth daily. (Patient not taking: Reported on 10/06/2022) 30 tablet 2   atorvastatin (LIPITOR) 10 MG tablet Take 1 tablet (10 mg total) by mouth  daily. 30 tablet 2   CORLANOR 7.5 MG TABS tablet Take 7.5 mg by mouth 2 (two) times daily.     dapagliflozin propanediol (FARXIGA) 10 MG TABS tablet TAKE 1 TABLET BY MOUTH DAILY 30 tablet 6   digoxin (LANOXIN) 0.125 MG tablet Take 1 tablet (125 mcg total) by mouth daily. 30 tablet 11   ergocalciferol (VITAMIN D2) 1.25 MG (50000 UT) capsule Take 50,000 Units by mouth once a week. (Patient not taking: Reported on 06/09/2023)     furosemide (LASIX) 40 MG tablet Take 1 tablet (40 mg total) by mouth daily. 90 tablet 3   methimazole (TAPAZOLE) 5 MG tablet Take 7.5 mg by mouth daily.     metoprolol tartrate (LOPRESSOR) 100 MG tablet Take 100 mg by mouth 2 (two) times daily.     Multiple Vitamin (MULTIVITAMIN WITH MINERALS) TABS tablet Take 1 tablet by mouth daily.     sacubitril-valsartan (ENTRESTO) 97-103 MG TAKE 1 TABLET BY MOUTH TWICE A DAY 60 tablet 6   spironolactone (ALDACTONE) 25 MG tablet TAKE 1/2 TABLET BY MOUTH DAILY 15 tablet 6   No current facility-administered medications for this visit.    Allergies:   Patient has no known allergies.    Social History:   reports  that he quit smoking about 46 years ago. His smoking use included cigarettes. He started smoking about 47 years ago. He has a 3 pack-year smoking history. He has never used smokeless tobacco. He reports that he does not drink alcohol and does not use drugs.   Family History:  family history is not on file.    ROS:     Review of Systems  Constitutional: Negative.   HENT: Negative.    Eyes: Negative.   Respiratory:  Positive for shortness of breath.   Gastrointestinal: Negative.   Genitourinary: Negative.   Musculoskeletal: Negative.   Skin: Negative.   Neurological: Negative.   Endo/Heme/Allergies: Negative.   Psychiatric/Behavioral: Negative.    All other systems reviewed and are negative.     All other systems are reviewed and negative.    PHYSICAL EXAM: VS:  BP 100/70   Pulse (!) 117   Ht 6\' 1"  (1.854 m)    Wt (!) 306 lb (138.8 kg)   SpO2 98%   BMI 40.37 kg/m  , BMI Body mass index is 40.37 kg/m. Last weight:  Wt Readings from Last 3 Encounters:  09/04/23 (!) 306 lb (138.8 kg)  08/11/23 (!) 306 lb (138.8 kg)  06/09/23 (!) 304 lb 6.4 oz (138.1 kg)     Physical Exam Vitals reviewed.  Constitutional:      Appearance: Normal appearance. He is normal weight.  HENT:     Head: Normocephalic.     Nose: Nose normal.     Mouth/Throat:     Mouth: Mucous membranes are moist.  Eyes:     Pupils: Pupils are equal, round, and reactive to light.  Cardiovascular:     Rate and Rhythm: Normal rate and regular rhythm.     Pulses: Normal pulses.     Heart sounds: Normal heart sounds.  Pulmonary:     Effort: Pulmonary effort is normal.  Abdominal:     General: Abdomen is flat. Bowel sounds are normal.  Musculoskeletal:        General: Normal range of motion.     Cervical back: Normal range of motion.  Skin:    General: Skin is warm.  Neurological:     General: No focal deficit present.     Mental Status: He is alert.  Psychiatric:        Mood and Affect: Mood normal.       EKG:   Recent Labs: 05/19/2023: ALT 15; B Natriuretic Peptide 809.8; BUN 20; Creatinine, Ser 1.00; Hemoglobin 15.6; Magnesium 2.3; Platelets 215; Potassium 3.6; Sodium 139    Lipid Panel No results found for: "CHOL", "TRIG", "HDL", "CHOLHDL", "VLDL", "LDLCALC", "LDLDIRECT"    Other studies Reviewed: Additional studies/ records that were reviewed today include:  Review of the above records demonstrates:      04/12/2018   11:13 AM  PAD Screen  Previous PAD dx? No  Previous surgical procedure? No  Pain with walking? No  Feet/toe relief with dangling? No  Painful, non-healing ulcers? No  Extremities discolored? No      ASSESSMENT AND PLAN:    ICD-10-CM   1. PAD (peripheral artery disease) (HCC)  I73.9 PCV ECHOCARDIOGRAM COMPLETE    2. Primary hypertension  I10 PCV ECHOCARDIOGRAM COMPLETE    3.  Acute HFrEF (heart failure with reduced ejection fraction) (HCC)  I50.21 PCV ECHOCARDIOGRAM COMPLETE    4. Acute coronary syndrome (HCC)  I24.9 PCV ECHOCARDIOGRAM COMPLETE    5. HFrEF (heart failure with reduced ejection fraction) (HCC)  I50.20 PCV ECHOCARDIOGRAM COMPLETE    6. Sinus tachycardia  R00.0 PCV ECHOCARDIOGRAM COMPLETE    7. SOB (shortness of breath)  R06.02 PCV ECHOCARDIOGRAM COMPLETE   take extra dose of lasix if SOB       Problem List Items Addressed This Visit       Cardiovascular and Mediastinum   Acute coronary syndrome (HCC)   Relevant Orders   PCV ECHOCARDIOGRAM COMPLETE   Acute HFrEF (heart failure with reduced ejection fraction) (HCC)   Relevant Orders   PCV ECHOCARDIOGRAM COMPLETE   Hypertension   Relevant Orders   PCV ECHOCARDIOGRAM COMPLETE   PAD (peripheral artery disease) (HCC) - Primary   Relevant Orders   PCV ECHOCARDIOGRAM COMPLETE   Other Visit Diagnoses     HFrEF (heart failure with reduced ejection fraction) (HCC)       Relevant Orders   PCV ECHOCARDIOGRAM COMPLETE   Sinus tachycardia       Relevant Orders   PCV ECHOCARDIOGRAM COMPLETE   SOB (shortness of breath)       take extra dose of lasix if SOB   Relevant Orders   PCV ECHOCARDIOGRAM COMPLETE          Disposition:   Return in about 2 weeks (around 09/18/2023) for echo and f/u.    Total time spent: 30 minutes  Signed,  Adrian Blackwater, MD  09/04/2023 1:13 PM    [facility]

## 2023-09-05 ENCOUNTER — Ambulatory Visit: Payer: Medicare HMO | Attending: Sports Medicine

## 2023-09-05 DIAGNOSIS — M25511 Pain in right shoulder: Secondary | ICD-10-CM | POA: Diagnosis present

## 2023-09-05 DIAGNOSIS — M6281 Muscle weakness (generalized): Secondary | ICD-10-CM | POA: Diagnosis present

## 2023-09-05 DIAGNOSIS — M25512 Pain in left shoulder: Secondary | ICD-10-CM | POA: Diagnosis present

## 2023-09-05 DIAGNOSIS — M542 Cervicalgia: Secondary | ICD-10-CM | POA: Insufficient documentation

## 2023-09-05 NOTE — Therapy (Signed)
OUTPATIENT PHYSICAL THERAPY Treatment   Patient Name: Ronald Horne MRN: [id number] DOB:01/08/1951, 72 y.o., male Today's Date: 09/05/2023  END OF SESSION:  PT End of Session - 09/05/23 0815     Visit Number 6    Number of Visits 17    Date for PT Re-Evaluation 09/29/23    Authorization Time Period Auth 20 visits 9/30-12/6    Authorization - Number of Visits 60    Progress Note Due on Visit 10    PT Start Time 0816    PT Stop Time 0858    PT Time Calculation (min) 42 min    Activity Tolerance Patient tolerated treatment well    Behavior During Therapy [facility] for tasks assessed/performed                 Past Medical History:  Diagnosis Date   Arthritis    Cardiomyopathy (HCC)    a. 08/2016 Echo: EF 30-35%, diff HK; b. 03/2018 Ex MV (Jadali): Ex time 2:00, large anter defect, EF 24%; c. 03/2018 Echo: EF 20-25%, diff HK. Gr1 DD; d. Refused cath.   Chronic back pain    Chronic combined systolic (congestive) and diastolic (congestive) heart failure (HCC)    a. 08/2016 Echo: EF 30-35%, diff HK, mild to mod MR, mild BAE; b. 03/2018 Echo: EF 20-25%, diff HK. Gr1 DD. MIldly dil Ao root. Mild BAE. Mildly reduced RV fxn.   GERD (gastroesophageal reflux disease)    Gout    Hypertension    Obesity    Past Surgical History:  Procedure Laterality Date   EPIDURAL BLOCK INJECTION     chronic back pain    LEFT HEART CATH AND CORONARY ANGIOGRAPHY Left 12/27/2019   Procedure: LEFT HEART CATH AND CORONARY ANGIOGRAPHY;  Surgeon: Laurier Nancy, MD;  Location: [facility] INVASIVE CV LAB;  Service: Cardiovascular;  Laterality: Left;   TONSILLECTOMY     Patient Active Problem List   Diagnosis Date Noted   CHF (congestive heart failure) (HCC) 05/19/2023   PAD (peripheral artery disease) (HCC) 09/02/2022   Acute respiratory failure with hypoxia (HCC) 07/31/2022   CHF exacerbation (HCC) 07/30/2022   Acute HFrEF (heart failure with reduced ejection fraction) (HCC) 07/30/2022   Hypokalemia  07/30/2022   Hyperglycemia 07/30/2022   Elevated troponin 07/30/2022   Obesity    Hypertension    GERD (gastroesophageal reflux disease)    Hyperthyroidism    Acute coronary syndrome (HCC) 11/28/2019    PCP: Sherrie Mustache, MD   REFERRING PROVIDER: Jerrel Ivory, DO  REFERRING DIAG: [phone] (ICD-10-CM) - Other muscle spasm M54.2 (ICD-10-CM) - Cervicalgia M47.812 (ICD-10-CM) - Spondylosis without myelopathy or radiculopathy, cervical region M50.323 (ICD-10-CM) - 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 8/10 neck pain currently.      Hand dominance: Right  PERTINENT HISTORY:  Cervicalgia. Took a medication that started with a T which made him drowsy which helped with the pain. Then was given a muscle relaxer which did not help, then got a shot in is R neck which did not help. He then got a muscle relaxer and also took tylenol with it. Neck pain begain about 2 weeks ago, gradual onset which has worsened since onset, currently a 7-8/10 neck pain currently. Pain is currently on his R posterior lateral neck. Pain [place] to [place] to the [place] Shoulder pain is better.   PAIN:  Are you having pain? Yes: NPRS scale: 7-8/10 Pain location: R posterior lateral neck (around levator scapula and R scalene area) Pain description: tight, achy Aggravating factors: R > L cervical rotation Relieving factors: bengay, heating pad  PRECAUTIONS: Hx of Cardiomyopathy, CHF   RED FLAGS: Bowel or bladder incontinence: No and Cauda equina syndrome: No     WEIGHT BEARING  RESTRICTIONS: No  FALLS:  Has patient fallen in last 6 months? Yes. Number of falls 1 Fell 5 days ago while sitting on a chair, reaching for the fridge on wet floor trying to fix the leaking fridge and his chair slipped. No other falls.   LIVING ENVIRONMENT: Lives with: lives with their family Lives in: House/apartment Stairs: Yes: Internal: 15 steps; on left going up and discontinues halfway and External: 4-5 steps; on right going up Has following equipment at home:  First floor set up    OCCUPATION:   PLOF: Independent  PATIENT GOALS: Decrease pain. Make the pain go away.   NEXT MD VISIT: Come back as needed  OBJECTIVE:  Note: Objective measures were completed at Evaluation unless otherwise noted.  DIAGNOSTIC FINDINGS:   X-ray cervical spine 2 to 3 views 07/11/2023 EXAM:  Cervical spine Radiographs - 2 views (AP, lateral) performed 07/11/2023   CLINICAL INFORMATION: Right side neck pain   COMPARISON: None   FINDINGS:   The cervical spine is visualized to the C6-7 intervertebral disc space in  the lateral view.  Mild straightening of the normal cervical lordosis.   Mild disc space narrowing at C6-7.  Mild facet hypertrophic changes worse  from C5-C7.  There are no fractures or dislocations.  No soft tissue  swelling.   PATIENT SURVEYS:  FOTO neck 42  COGNITION: Overall cognitive status: Within functional limits for tasks assessed  SENSATION:   POSTURE:  forward neck, slight L cervical side bend, upper thoracic kyphosis, movement preference around C5/6 area.   PALPATION: TTP L posterior neck around C5/C6 area. Muscle tension R upper trap and levator scapula muscles   CERVICAL ROM:   Active ROM A/PROM (deg) eval  Flexion WFL with reproduction of R posterior lateral neck pain around levator scapular area with overpressure  Extension WFL with reproduction of R posterior lateral neck pain around C5/6 area with overpressure  Right lateral flexion WFL with R  lateral neck pain around C5/6 area  Left lateral flexion WFL with R lateral neck stretch.   Right rotation 28 degrees with R lateral neck pain, R lateral neck tightness  Left rotation 38 degrees with R lateral neck stretch   (Blank rows = not tested)  UPPER EXTREMITY ROM:  Active ROM Right eval Left eval  Shoulder flexion    Shoulder extension    Shoulder abduction    Shoulder adduction  Shoulder extension    Shoulder internal rotation    Shoulder external rotation    Elbow flexion    Elbow extension    Wrist flexion    Wrist extension    Wrist ulnar deviation    Wrist radial deviation    Wrist pronation    Wrist supination     (Blank rows = not tested)  UPPER EXTREMITY MMT:  MMT Right eval Left eval  Shoulder flexion 4 4-  Shoulder extension    Shoulder abduction    Shoulder adduction    Shoulder extension    Shoulder internal rotation    Shoulder external rotation    Middle trapezius    Lower trapezius seated manually resisted 4 4  Elbow flexion 4 4  Elbow extension 4+ 4  Wrist flexion    Wrist extension 4+ 4+  Wrist ulnar deviation    Wrist radial deviation    Wrist pronation    Wrist supination    Grip strength     (Blank rows = not tested)  CERVICAL SPECIAL TESTS:  Spurling's test: Negative and Distraction test: Positive Decreased pain with cervical distraction    FUNCTIONAL TESTS:    TODAY'S TREATMENT:                                                                                                                              DATE: 09/05/2023    Manual therapy  Seated STM R > L upper trap and R cervical paraspinal muscles to decrease tension   Therapeutic exercise  Seated B scapular retraction 10x5 seconds for 2 sets  Seated chin tucks 10x5 seconds   SpO2 room air 98 % HR 96  Seated with PT manual caudal pressure to R first rib   L cervical side bend 30 seconds x 5   R and L cervical rotation 10x2 each direction   Therapeutic  rest breaks secondary to fatigue    Improved exercise technique, movement at target joints, use of target muscles after mod verbal, visual, tactile cues.      PATIENT EDUCATION:  Education details: there-ex, HEP, POC Person educated: Patient Education method: Explanation, Demonstration, Tactile cues, Verbal cues, and Handouts Education comprehension: verbalized understanding and returned demonstration  HOME EXERCISE PROGRAM: Access Code: [email or web address] URL: [email or web address] Date: 07/31/2023 Prepared by: Loralyn Freshwater  Exercises - Gentle Levator Scapulae Stretch  - 3 x daily - 7 x weekly - 1 sets - 5 reps - 30 seconds  hold - Seated Scapular Retraction  - 3 x daily - 7 x weekly - 3 sets - 10 reps - 5 seconds hold   ASSESSMENT:  CLINICAL IMPRESSION: Continued working on improving proper scapular movement, as well as decreasing R > L upper trap muscle tension as well as decreasing R scalene muscle tension to neck. Also worked on improving upper thoracic extension and decreasing lower cervical extension stress to his neck. Neck feels better after session reported by pt. Challenges  to progress include CHF which can affect the amount of exercises he can perform. Pt will benefit from continued skilled physical therapy services to decrease pain, improve strength and function.         OBJECTIVE IMPAIRMENTS: decreased ROM, improper body mechanics, postural dysfunction, and pain.   ACTIVITY LIMITATIONS: sleeping and looking around  PARTICIPATION LIMITATIONS:   PERSONAL FACTORS: Age, Fitness, Past/current experiences, and 3+ comorbidities: arthritis, cardiomyopathy, chronic back pain, CHF, HTN  are also affecting patient's functional outcome.   REHAB POTENTIAL: Fair    CLINICAL DECISION MAKING: Evolving/moderate complexity Pain is worsening since onset per pt.   EVALUATION COMPLEXITY: Moderate   GOALS: Goals reviewed with patient? Yes  SHORT TERM GOALS: Target  date: 08/25/2023   Pt will be independent with his initial HEP to decrease pain, improve strength and ability to look around and sleep more comfortably.  Baseline: Pt has started his initial HEP (07/31/2023) Goal status: INITIAL   LONG TERM GOALS: Target date: 09/29/2023  Pt will have a decrease in neck pain to 5/10 or less at worst to promote ability to look around as well as sleep more comfortably.  Baseline: 9-10/10 neck pain at worst for the past 3 weeks. (07/31/2023) Goal status: INITIAL  2.  Pt will improve  B lower trap strength by at least 1/2 MMT to promote ability to look around with less neck pain.  Baseline:  MMT Right eval Left eval  Lower trapezius (seated manually resisted) 4 4  (07/31/2023) Goal status: INITIAL  3.  Pt will improve R and L cervical rotation AROM by at least 10 degrees to promote ability to look around with less difficulty.   Baseline:  Active ROM A/PROM (deg) eval  Right rotation 28 degrees with R lateral neck pain, R lateral neck tightness  Left rotation 38 degrees with R lateral neck stretch  (07/31/2023)  Goal status: INITIAL  4.  Pt will improve his neck FOTO score by at least 10 points as a demonstration of improved function.  Baseline: Neck FOTO 42 (07/31/2023) Goal status: INITIAL     PLAN:  PT FREQUENCY: 2x/week  PT DURATION: 8 weeks  PLANNED INTERVENTIONS: Therapeutic exercises, Therapeutic activity, Neuromuscular re-education, Patient/Family education, Dry Needling, Electrical stimulation, Traction, Manual therapy, and Re-evaluation  PLAN FOR NEXT SESSION: posture, lower trap, anterior cervical strengthening, thoracic extension, manual techniques, modalities PRN    Sammy Douthitt, PT, DPT 09/05/2023, 9:16 AM

## 2023-09-07 ENCOUNTER — Telehealth: Payer: Self-pay

## 2023-09-07 ENCOUNTER — Other Ambulatory Visit: Payer: Self-pay | Admitting: Sports Medicine

## 2023-09-07 ENCOUNTER — Ambulatory Visit: Payer: Medicare HMO

## 2023-09-07 DIAGNOSIS — M47812 Spondylosis without myelopathy or radiculopathy, cervical region: Secondary | ICD-10-CM

## 2023-09-07 DIAGNOSIS — M50323 Other cervical disc degeneration at C6-C7 level: Secondary | ICD-10-CM

## 2023-09-07 DIAGNOSIS — M542 Cervicalgia: Secondary | ICD-10-CM

## 2023-09-07 NOTE — Telephone Encounter (Signed)
Called and left voicemail for pt for missed appointment and reminder of next appointment on Monday, 09/11/23.    Nolon Bussing, PT, DPT Physical Therapist - [facility]  09/07/23, 8:34 AM

## 2023-09-11 ENCOUNTER — Ambulatory Visit (INDEPENDENT_AMBULATORY_CARE_PROVIDER_SITE_OTHER): Payer: Medicare HMO

## 2023-09-11 ENCOUNTER — Ambulatory Visit: Payer: Medicare HMO

## 2023-09-11 DIAGNOSIS — R000 Tachycardia, unspecified: Secondary | ICD-10-CM

## 2023-09-11 DIAGNOSIS — M25512 Pain in left shoulder: Secondary | ICD-10-CM

## 2023-09-11 DIAGNOSIS — M542 Cervicalgia: Secondary | ICD-10-CM | POA: Diagnosis not present

## 2023-09-11 DIAGNOSIS — M25511 Pain in right shoulder: Secondary | ICD-10-CM

## 2023-09-11 DIAGNOSIS — I351 Nonrheumatic aortic (valve) insufficiency: Secondary | ICD-10-CM

## 2023-09-11 DIAGNOSIS — I5020 Unspecified systolic (congestive) heart failure: Secondary | ICD-10-CM

## 2023-09-11 DIAGNOSIS — I739 Peripheral vascular disease, unspecified: Secondary | ICD-10-CM

## 2023-09-11 DIAGNOSIS — I249 Acute ischemic heart disease, unspecified: Secondary | ICD-10-CM

## 2023-09-11 DIAGNOSIS — I5021 Acute systolic (congestive) heart failure: Secondary | ICD-10-CM

## 2023-09-11 DIAGNOSIS — R0602 Shortness of breath: Secondary | ICD-10-CM

## 2023-09-11 DIAGNOSIS — M6281 Muscle weakness (generalized): Secondary | ICD-10-CM

## 2023-09-11 DIAGNOSIS — I10 Essential (primary) hypertension: Secondary | ICD-10-CM

## 2023-09-11 NOTE — Therapy (Signed)
OUTPATIENT PHYSICAL THERAPY TREATMENT   Patient Name: Ronald Horne MRN: [id number] DOB:11-12-1950, 72 y.o., male Today's Date: 09/11/2023  END OF SESSION:  PT End of Session - 09/11/23 1350     Visit Number 7    Number of Visits 17    Date for PT Re-Evaluation 09/29/23    Authorization Type [employer] Primary; Medicaid [place] Secondary    PT Start Time 1345    PT Stop Time 1425    PT Time Calculation (min) 40 min    Activity Tolerance Patient tolerated treatment well    Behavior During Therapy WFL for tasks assessed/performed                  Past Medical History:  Diagnosis Date   Arthritis    Cardiomyopathy (HCC)    a. 08/2016 Echo: EF 30-35%, diff HK; b. 03/2018 Ex MV ([facility]): Ex time 2:00, large anter defect, EF 24%; c. 03/2018 Echo: EF 20-25%, diff HK. Gr1 DD; d. Refused cath.   Chronic back pain    Chronic combined systolic (congestive) and diastolic (congestive) heart failure (HCC)    a. 08/2016 Echo: EF 30-35%, diff HK, mild to mod MR, mild BAE; b. 03/2018 Echo: EF 20-25%, diff HK. Gr1 DD. MIldly dil Ao root. Mild BAE. Mildly reduced RV fxn.   GERD (gastroesophageal reflux disease)    Gout    Hypertension    Obesity    Past Surgical History:  Procedure Laterality Date   EPIDURAL BLOCK INJECTION     chronic back pain    LEFT HEART CATH AND CORONARY ANGIOGRAPHY Left 12/27/2019   Procedure: LEFT HEART CATH AND CORONARY ANGIOGRAPHY;  Surgeon: Laurier Nancy, MD;  Location: [facility] INVASIVE CV LAB;  Service: Cardiovascular;  Laterality: Left;   TONSILLECTOMY     Patient Active Problem List   Diagnosis Date Noted   CHF (congestive heart failure) (HCC) 05/19/2023   PAD (peripheral artery disease) (HCC) 09/02/2022   Acute respiratory failure with hypoxia (HCC) 07/31/2022   CHF exacerbation (HCC) 07/30/2022   Acute HFrEF (heart failure with reduced ejection fraction) (HCC) 07/30/2022   Hypokalemia 07/30/2022   Hyperglycemia 07/30/2022   Elevated  troponin 07/30/2022   Obesity    Hypertension    GERD (gastroesophageal reflux disease)    Hyperthyroidism    Acute coronary syndrome (HCC) 11/28/2019    PCP: Sherrie Mustache, MD   REFERRING PROVIDER: Jerrel Ivory, DO  REFERRING DIAG: [phone] (ICD-10-CM) - Other muscle spasm M54.2 (ICD-10-CM) - Cervicalgia M47.812 (ICD-10-CM) - Spondylosis without myelopathy or radiculopathy, cervical region M50.323 (ICD-10-CM) - 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 Got better with prednisone, but getting worse again now that he is done with course. He is hoping for additional relief from either future injections or new medication.     Hand dominance: Right  PERTINENT HISTORY:  Cervicalgia. Took a medication that started with a T which made him drowsy which helped with the pain. Then was given a muscle relaxer which did not help, then got a shot in is R neck which did not help. He then got a muscle relaxer and also took tylenol with it. Neck pain begain about 2 weeks ago, gradual onset which has worsened since onset, currently a 7-8/10 neck pain currently. Pain is currently on his R posterior lateral neck. Pain wants to travel to the [place] Shoulder pain is better.   PAIN:  Are you having pain? Yes: NPRS scale: 7-8/10 Pain location: R posterior lateral neck (around levator scapula and R scalene area) Pain description: tight, achy Aggravating factors: R > L cervical  rotation Relieving factors: bengay, heating pad  PRECAUTIONS: Hx of Cardiomyopathy, CHF   RED FLAGS: Bowel or bladder incontinence: No and Cauda equina syndrome: No     WEIGHT BEARING RESTRICTIONS: No  FALLS:  Has patient fallen in last 6 months? Yes. Number of falls 1 Fell 5 days ago while sitting on a chair, reaching for the fridge on wet floor trying to fix the leaking fridge and his chair slipped. No other falls.   LIVING ENVIRONMENT: Lives with: lives with their family Lives in: House/apartment Stairs: Yes: Internal: 15 steps; on left going up and discontinues halfway and External: 4-5 steps; on right going up Has following equipment at home:  First floor set up    OCCUPATION: on disability   PLOF: Independent  PATIENT GOALS: Decrease pain. Make the pain go away.   NEXT MD VISIT: Come back as needed  OBJECTIVE:   TODAY'S TREATMENT: DATE: 09/11/23   *Hot pack applied to neck for session in supine -cervical rotation 1x10 (very pain limited range) achieves ~40 degrees with pain  -cervical tucked retraction 1x10  -wand flexion BUE 1x0 (chronic shoulder pain does not allow)  -chest press with sled bar 1x10 ([place]!)  -isometric shoulder extension 1x10   -cervical rotation 1x10 (very pain limited range)  -cervical tucked retraction 1x10  -chest press with sled bar 1x10 ([place]!)  -isometric shoulder extension 1x10   -UBE x4 minutes, seat #9   PATIENT EDUCATION:  Education details: why we working scapular exercises for neck pain Person educated: Patient Education method: Programmer, multimedia, Facilities manager, Actor cues, Verbal cues, and Handouts Education comprehension: verbalized understanding and returned demonstration  HOME EXERCISE PROGRAM: Access Code: [email or web address] URL: [email or web address] Date: 07/31/2023 Prepared by: Loralyn Freshwater  Exercises - Gentle Levator Scapulae Stretch  - 3 x daily - 7 x weekly - 1 sets - 5 reps - 30 seconds  hold -  Seated Scapular Retraction  - 3 x daily - 7 x weekly - 3 sets - 10 reps - 5 seconds hold   ASSESSMENT:  CLINICAL IMPRESSION: Pt continues to show poor tolerance to any degree of Right cervical rotation, hib chronic bilat shoulder issues make interventions difficult as well. Anticipate MRI upcoming in 2 weeks which would be insightful as PT services have not been able to render improvements initially proposed. Pt remains optimistic overall, but is really hoping for more relief after MRI results. Will continue to shop around for activities that can improve self management at home, as  many have improve pain levels during and immediately after sessions.        OBJECTIVE IMPAIRMENTS: decreased ROM, improper body mechanics, postural dysfunction, and pain.   ACTIVITY LIMITATIONS: sleeping and looking around  PARTICIPATION LIMITATIONS:   PERSONAL FACTORS: Age, Fitness, Past/current experiences, and 3+ comorbidities: arthritis, cardiomyopathy, chronic back pain, CHF, HTN  are also affecting patient's functional outcome.   REHAB POTENTIAL: Fair    CLINICAL DECISION MAKING: Evolving/moderate complexity Pain is worsening since onset per pt.   EVALUATION COMPLEXITY: Moderate   GOALS: Goals reviewed with patient? Yes  SHORT TERM GOALS: Target date: 08/25/2023  Pt will be independent with his initial HEP to decrease pain, improve strength and ability to look around and sleep more comfortably.  Baseline: Pt has started his initial HEP (07/31/2023) Goal status: INITIAL   LONG TERM GOALS: Target date: 09/29/2023  Pt will have a decrease in neck pain to 5/10 or less at worst to promote ability to look around as well as sleep more comfortably.  Baseline: 9-10/10 neck pain at worst for the past 3 weeks. (07/31/2023) Goal status: INITIAL  2.  Pt will improve  B lower trap strength by at least 1/2 MMT to promote ability to look around with less neck pain.  Baseline:  MMT Right eval Left eval   Lower trapezius (seated manually resisted) 4 4  (07/31/2023) Goal status: INITIAL  3.  Pt will improve R and L cervical rotation AROM by at least 10 degrees to promote ability to look around with less difficulty.   Baseline:  Active ROM A/PROM (deg) eval  Right rotation 28 degrees with R lateral neck pain, R lateral neck tightness  Left rotation 38 degrees with R lateral neck stretch  (07/31/2023)  Goal status: INITIAL  4.  Pt will improve his neck FOTO score by at least 10 points as a demonstration of improved function.  Baseline: Neck FOTO 42 (07/31/2023) Goal status: INITIAL   PLAN:  PT FREQUENCY: 2x/week  PT DURATION: 8 weeks  PLANNED INTERVENTIONS: Therapeutic exercises, Therapeutic activity, Neuromuscular re-education, Patient/Family education, Dry Needling, Electrical stimulation, Traction, Manual therapy, and Re-evaluation  PLAN FOR NEXT SESSION: posture, lower trap, anterior cervical strengthening, thoracic extension, manual techniques, modalities PRN  1:55 PM, 09/11/23 Rosamaria Lints, PT, DPT Physical Therapist - [place] [phone] (Office)

## 2023-09-13 ENCOUNTER — Telehealth: Payer: Self-pay

## 2023-09-13 ENCOUNTER — Ambulatory Visit: Payer: Medicare HMO

## 2023-09-13 NOTE — Therapy (Deleted)
OUTPATIENT PHYSICAL THERAPY TREATMENT   Patient Name: Ronald Horne MRN: [id number] DOB:[birth year], 72 y.o., male Today's Date: 09/13/2023  END OF SESSION:         Past Medical History:  Diagnosis Date   Arthritis    Cardiomyopathy (HCC)    a. 08/2016 Echo: EF 30-35%, diff HK; b. 03/2018 Ex MV Dario Guardian): Ex time 2:00, large anter defect, EF 24%; c. 03/2018 Echo: EF 20-25%, diff HK. Gr1 DD; d. Refused cath.   Chronic back pain    Chronic combined systolic (congestive) and diastolic (congestive) heart failure (HCC)    a. 08/2016 Echo: EF 30-35%, diff HK, mild to mod MR, mild BAE; b. 03/2018 Echo: EF 20-25%, diff HK. Gr1 DD. MIldly dil Ao root. Mild BAE. Mildly reduced RV fxn.   GERD (gastroesophageal reflux disease)    Gout    Hypertension    Obesity    Past Surgical History:  Procedure Laterality Date   EPIDURAL BLOCK INJECTION     chronic back pain    LEFT HEART CATH AND CORONARY ANGIOGRAPHY Left 12/27/2019   Procedure: LEFT HEART CATH AND CORONARY ANGIOGRAPHY;  Surgeon: Laurier Nancy, MD;  Location: [facility] INV[facility]IVE CV [facility];  Service: Cardiovascular;  Laterality: Left;   TONSILLECTOMY     Patient Active Problem List   Diagnosis Date Noted   CHF (congestive heart failure) (HCC) 05/19/2023   PAD (peripheral artery disease) (HCC) 09/02/2022   Acute respiratory failure with hypoxia (HCC) 07/31/2022   CHF exacerbation (HCC) 07/30/2022   Acute HFrEF (heart failure with reduced ejection fraction) (HCC) 07/30/2022   Hypokalemia 07/30/2022   Hyperglycemia 07/30/2022   Elevated troponin 07/30/2022   Obesity    Hypertension    GERD (gastroesophageal reflux disease)    Hyperthyroidism    Acute coronary syndrome (HCC) 11/28/2019    PCP: Sherrie Mustache, MD   REFERRING PROVIDER: Jerrel Ivory, DO  REFERRING DIAG: [phone] (ICD-10-CM) - Other muscle spasm M54.2 (ICD-10-CM) - Cervicalgia M47.812 (ICD-10-CM) - Spondylosis without myelopathy or radiculopathy, cervical  region M50.323 (ICD-10-CM) - 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  Rationale for Evaluation and Treatment: Rehabilitation  ONSET DATE: 07/28/2023 (date PT referral signed)  SUBJECTIVE:                                                                                                                                                                                                         SUBJECTIVE STATEMENT:  ***  Pt reports conitnued pain in neck. Got better with prednisone, but getting worse  again now that he is done with course. He is hoping for additional relief from either future injections or new medication.     Hand dominance: Right  PERTINENT HISTORY:  Cervicalgia. Took a medication that started with a T which made him drowsy which helped with the pain. Then was given a muscle relaxer which did not help, then got a shot in is R neck which did not help. He then got a muscle relaxer and also took tylenol with it. Neck pain begain about 2 weeks ago, gradual onset which has worsened since onset, currently a 7-8/10 neck pain currently. Pain is currently on his R posterior lateral neck. Pain wants to travel to the [place].. Shoulder pain is better.   PAIN:  Are you having pain? Yes: NPRS scale: 7-8/10 Pain location: R posterior lateral neck (around levator scapula and R scalene area) Pain description: tight, achy Aggravating factors: R > L cervical rotation Relieving factors: bengay, heating pad  PRECAUTIONS: Hx of Cardiomyopathy, CHF   RED FLAGS: Bowel or bladder incontinence: No and Cauda equina syndrome: No     WEIGHT BEARING RESTRICTIONS: No  FALLS:  Has patient fallen in last 6 months? Yes. Number of falls 1 Fell 5 days ago while sitting on a chair, reaching for the fridge on wet floor trying to fix the leaking fridge and his chair slipped. No other falls.   LIVING ENVIRONMENT: Lives with: lives with their family Lives in:  House/apartment Stairs: Yes: Internal: 15 steps; on left going up and discontinues halfway and External: 4-5 steps; on right going up Has following equipment at home:  First floor set up    OCCUPATION: on disability   PLOF: Independent  PATIENT GOALS: Decrease pain. Make the pain go away.   NEXT MD VISIT: Come back as needed  OBJECTIVE:   TODAY'S TREATMENT: DATE: 09/13/23  ***  *Hot pack applied to neck for session in supine -cervical rotation 1x10 (very pain limited range) achieves ~40 degrees with pain  -cervical tucked retraction 1x10  -wand flexion BUE 1x0 (chronic shoulder pain does not allow)  -chest press with sled bar 1x10 ([place]!)  -isometric shoulder extension 1x10   -cervical rotation 1x10 (very pain limited range)  -cervical tucked retraction 1x10  -chest press with sled bar 1x10 ([place]!)  -isometric shoulder extension 1x10   -UBE x4 minutes, seat #9   PATIENT EDUCATION:  Education details: why we working scapular exercises for neck pain Person educated: Patient Education method: Programmer, multimedia, Facilities manager, Actor cues, Verbal cues, and Handouts Education comprehension: verbalized understanding and returned demonstration  HOME EXERCISE PROGRAM: Access Code: [email or web address] URL: [email or web address] Date: 07/31/2023 Prepared by: Loralyn Freshwater  Exercises - Gentle Levator Scapulae Stretch  - 3 x daily - 7 x weekly - 1 sets - 5 reps - 30 seconds  hold - Seated Scapular Retraction  - 3 x daily - 7 x weekly - 3 sets - 10 reps - 5 seconds hold   ASSESSMENT:  CLINICAL IMPRESSION: *** Pt continues to show poor tolerance to any degree of Right cervical rotation, hib chronic bilat shoulder issues make interventions difficult as well. Anticipate MRI upcoming in 2 weeks which would be insightful as PT services have not been able to render improvements initially proposed. Pt remains optimistic overall, but is really hoping for more relief after MRI  results. Will continue to shop around for activities that can improve self management at home, as many have improve pain levels during and immediately after sessions.  OBJECTIVE IMPAIRMENTS: decreased ROM, improper body mechanics, postural dysfunction, and pain.   ACTIVITY LIMITATIONS: sleeping and looking around  PARTICIPATION LIMITATIONS:   PERSONAL FACTORS: Age, Fitness, Past/current experiences, and 3+ comorbidities: arthritis, cardiomyopathy, chronic back pain, CHF, HTN  are also affecting patient's functional outcome.   REHAB POTENTIAL: Fair    CLINICAL DECISION MAKING: Evolving/moderate complexity Pain is worsening since onset per pt.   EVALUATION COMPLEXITY: Moderate   GOALS: Goals reviewed with patient? Yes  SHORT TERM GOALS: Target date: 08/25/2023  Pt will be independent with his initial HEP to decrease pain, improve strength and ability to look around and sleep more comfortably.  Baseline: Pt has started his initial HEP (07/31/2023) Goal status: INITIAL   LONG TERM GOALS: Target date: 09/29/2023  Pt will have a decrease in neck pain to 5/10 or less at worst to promote ability to look around as well as sleep more comfortably.  Baseline: 9-10/10 neck pain at worst for the past 3 weeks. (07/31/2023) Goal status: INITIAL  2.  Pt will improve  B lower trap strength by at least 1/2 MMT to promote ability to look around with less neck pain.  Baseline:  MMT Right eval Left eval  Lower trapezius (seated manually resisted) 4 4  (07/31/2023) Goal status: INITIAL  3.  Pt will improve R and L cervical rotation AROM by at least 10 degrees to promote ability to look around with less difficulty.   Baseline:  Active ROM A/PROM (deg) eval  Right rotation 28 degrees with R lateral neck pain, R lateral neck tightness  Left rotation 38 degrees with R lateral neck stretch  (07/31/2023)  Goal status: INITIAL  4.  Pt will improve his neck FOTO score by at least 10  points as a demonstration of improved function.  Baseline: Neck FOTO 42 (07/31/2023) Goal status: INITIAL   PLAN:  PT FREQUENCY: 2x/week  PT DURATION: 8 weeks  PLANNED INTERVENTIONS: Therapeutic exercises, Therapeutic activity, Neuromuscular re-education, Patient/Family education, Dry Needling, Electrical stimulation, Traction, Manual therapy, and Re-evaluation  PLAN FOR NEXT SESSION: posture, lower trap, anterior cervical strengthening, thoracic extension, manual techniques, modalities PRN  Nolon Bussing, PT, DPT Physical Therapist - [place]  [facility]  09/13/23, 8:16 AM

## 2023-09-13 NOTE — Telephone Encounter (Signed)
Called and spoke with pt.  Pt stating he had another MD appointment this morning and was headed to it currently.  Pt reminded of next appointment (Friday, 09/15/23) and reminded of attendance policy.    Nolon Bussing, PT, DPT Physical Therapist - [facility]  09/13/23, 8:48 AM

## 2023-09-15 ENCOUNTER — Ambulatory Visit: Payer: Medicare HMO

## 2023-09-15 ENCOUNTER — Telehealth: Payer: Self-pay | Admitting: Cardiovascular Disease

## 2023-09-15 ENCOUNTER — Ambulatory Visit: Payer: Medicare HMO | Admitting: Cardiovascular Disease

## 2023-09-15 ENCOUNTER — Telehealth: Payer: Self-pay

## 2023-09-15 NOTE — Telephone Encounter (Signed)
Patient stopped by the office and is talking about having trouble breathing at night. He believes it is coming from his [place]. Spoke with SK and he advised that patient may d/c the [place] and they will discuss at next weeks visit.

## 2023-09-15 NOTE — Telephone Encounter (Signed)
Called and spoke to pt at 8:25 AM to see if pt was on his way for appointment.  Pt noted he was getting dressed and would be on his way shortly.  Therapist asked how far pt lived from clinic, with pt reporting ~10 minutes away.  Pt educated that pt would only be able to be seen for 10-15 minutes after being checked in, due to already being 12+ minutes late at this time of conversation and pt was not fully dressed.  Pt frustratingly stated that clinic had changed times of appointments, and pt reminded that author called on 09/13/23 when pt did not arrive for his appointment and was reminded of today's appointment at 8:15 and times had not been changed.  Pt reminded of attendance policy and noted that if he was not at next appointment, his future visits would be cancelled due to the attendance agreement.    Nolon Bussing, PT, DPT Physical Therapist - [facility]  09/15/23, 11:34 AM

## 2023-09-18 ENCOUNTER — Ambulatory Visit: Payer: Medicare HMO

## 2023-09-18 ENCOUNTER — Encounter: Payer: Self-pay | Admitting: Sports Medicine

## 2023-09-19 ENCOUNTER — Encounter: Payer: Self-pay | Admitting: Cardiovascular Disease

## 2023-09-19 ENCOUNTER — Ambulatory Visit (INDEPENDENT_AMBULATORY_CARE_PROVIDER_SITE_OTHER): Payer: Medicare HMO | Admitting: Cardiovascular Disease

## 2023-09-19 VITALS — BP 140/70 | HR 86 | Ht 73.5 in | Wt 304.4 lb

## 2023-09-19 DIAGNOSIS — I5021 Acute systolic (congestive) heart failure: Secondary | ICD-10-CM | POA: Diagnosis not present

## 2023-09-19 DIAGNOSIS — R0602 Shortness of breath: Secondary | ICD-10-CM

## 2023-09-19 DIAGNOSIS — I5020 Unspecified systolic (congestive) heart failure: Secondary | ICD-10-CM

## 2023-09-19 DIAGNOSIS — I10 Essential (primary) hypertension: Secondary | ICD-10-CM | POA: Diagnosis not present

## 2023-09-19 DIAGNOSIS — I739 Peripheral vascular disease, unspecified: Secondary | ICD-10-CM

## 2023-09-19 MED ORDER — METOPROLOL SUCCINATE ER 200 MG PO TB24
200.0000 mg | ORAL_TABLET | Freq: Every day | ORAL | 11 refills | Status: DC
Start: 2023-09-19 — End: 2024-02-14

## 2023-09-19 NOTE — Progress Notes (Signed)
Cardiology Office Note   Date:  09/19/2023   ID:  Ronald Horne, DOB 30-Dec-1950, MRN [id number]  PCP:  Sherrie Mustache, MD  Cardiologist:  Adrian Blackwater, MD      History of Present Illness: Ronald Horne is a 72 y.o. male who presents for  Chief Complaint  Patient presents with   Follow-up    2 week follow up, Echo results.    Shortness of Breath This is a recurrent problem. The current episode started more than 1 month ago. The problem has been waxing and waning. Associated symptoms include orthopnea and PND. Pertinent negatives include no chest pain.      Past Medical History:  Diagnosis Date   Arthritis    Cardiomyopathy (HCC)    a. 08/2016 Echo: EF 30-35%, diff HK; b. 03/2018 Ex MV (Jadali): Ex time 2:00, large anter defect, EF 24%; c. 03/2018 Echo: EF 20-25%, diff HK. Gr1 DD; d. Refused cath.   Chronic back pain    Chronic combined systolic (congestive) and diastolic (congestive) heart failure (HCC)    a. 08/2016 Echo: EF 30-35%, diff HK, mild to mod MR, mild BAE; b. 03/2018 Echo: EF 20-25%, diff HK. Gr1 DD. MIldly dil Ao root. Mild BAE. Mildly reduced RV fxn.   GERD (gastroesophageal reflux disease)    Gout    Hypertension    Obesity      Past Surgical History:  Procedure Laterality Date   EPIDURAL BLOCK INJECTION     chronic back pain    LEFT HEART CATH AND CORONARY ANGIOGRAPHY Left 12/27/2019   Procedure: LEFT HEART CATH AND CORONARY ANGIOGRAPHY;  Surgeon: Laurier Nancy, MD;  Location: [facility] INVASIVE CV [facility];  Service: Cardiovascular;  Laterality: Left;   TONSILLECTOMY       Current Outpatient Medications  Medication Sig Dispense Refill   acetaminophen (TYLENOL) 325 MG tablet Take 500 mg by mouth every 6 (six) hours as needed for moderate pain or mild pain.     albuterol (VENTOLIN HFA) 108 (90 Base) MCG/ACT inhaler Inhale into the lungs.     ascorbic acid (VITAMIN C) 500 MG tablet Take 500 mg by mouth daily.     atorvastatin (LIPITOR) 10 MG tablet Take 1  tablet (10 mg total) by mouth daily. 30 tablet 2   baclofen (LIORESAL) 10 MG tablet Take 10 mg by mouth 2 (two) times daily.     CORLANOR 7.5 MG TABS tablet Take 7.5 mg by mouth 2 (two) times daily.     dapagliflozin propanediol (FARXIGA) 10 MG TABS tablet TAKE 1 TABLET BY MOUTH DAILY 30 tablet 6   digoxin (LANOXIN) 0.125 MG tablet Take 1 tablet (125 mcg total) by mouth daily. 30 tablet 11   furosemide (LASIX) 40 MG tablet Take 1 tablet (40 mg total) by mouth daily. 90 tablet 3   methimazole (TAPAZOLE) 5 MG tablet Take 7.5 mg by mouth daily.     metoprolol (TOPROL XL) 200 MG 24 hr tablet Take 1 tablet (200 mg total) by mouth daily. 30 tablet 11   Multiple Vitamin (MULTIVITAMIN WITH MINERALS) TABS tablet Take 1 tablet by mouth daily.     sacubitril-valsartan (ENTRESTO) 97-103 MG TAKE 1 TABLET BY MOUTH TWICE A DAY 60 tablet 6   spironolactone (ALDACTONE) 25 MG tablet TAKE 1/2 TABLET BY MOUTH DAILY 15 tablet 6   aspirin 81 MG tablet Take 1 tablet (81 mg total) by mouth daily. (Patient not taking: Reported on 10/06/2022) 30 tablet 2   ergocalciferol (VITAMIN D2)  1.25 MG (50000 UT) capsule Take 50,000 Units by mouth once a week. (Patient not taking: Reported on 06/09/2023)     No current facility-administered medications for this visit.    Allergies:   Patient has no known allergies.    Social History:   reports that he quit smoking about 46 years ago. His smoking use included cigarettes. He started smoking about 47 years ago. He has a 3 pack-year smoking history. He has never used smokeless tobacco. He reports that he does not drink alcohol and does not use drugs.   Family History:  family history is not on file.    ROS:     Review of Systems  Constitutional: Negative.   HENT: Negative.    Eyes: Negative.   Respiratory:  Positive for shortness of breath.   Cardiovascular:  Positive for orthopnea and PND. Negative for chest pain.  Gastrointestinal: Negative.   Genitourinary: Negative.    Musculoskeletal: Negative.   Skin: Negative.   Neurological: Negative.   Endo/Heme/Allergies: Negative.   Psychiatric/Behavioral: Negative.    All other systems reviewed and are negative.     All other systems are reviewed and negative.    PHYSICAL EXAM: VS:  BP (!) 140/70   Pulse 86   Ht 6' 1.5" (1.867 m)   Wt (!) 304 lb 6.4 oz (138.1 kg)   SpO2 94%   BMI 39.62 kg/m  , BMI Body mass index is 39.62 kg/m. Last weight:  Wt Readings from Last 3 Encounters:  09/19/23 (!) 304 lb 6.4 oz (138.1 kg)  09/04/23 (!) 306 lb (138.8 kg)  08/11/23 (!) 306 lb (138.8 kg)     Physical Exam Vitals reviewed.  Constitutional:      Appearance: Normal appearance. He is normal weight.  HENT:     Head: Normocephalic.     Nose: Nose normal.     Mouth/Throat:     Mouth: Mucous membranes are moist.  Eyes:     Pupils: Pupils are equal, round, and reactive to light.  Cardiovascular:     Rate and Rhythm: Normal rate and regular rhythm.     Pulses: Normal pulses.     Heart sounds: Normal heart sounds.  Pulmonary:     Effort: Pulmonary effort is normal.  Abdominal:     General: Abdomen is flat. Bowel sounds are normal.  Musculoskeletal:        General: Normal range of motion.     Cervical back: Normal range of motion.  Skin:    General: Skin is warm.  Neurological:     General: No focal deficit present.     Mental Status: He is alert.  Psychiatric:        Mood and Affect: Mood normal.       EKG:   Recent Labs: 05/19/2023: ALT 15; B Natriuretic Peptide 809.8; BUN 20; Creatinine, Ser 1.00; Hemoglobin 15.6; Magnesium 2.3; Platelets 215; Potassium 3.6; Sodium 139    Lipid Panel No results found for: "CHOL", "TRIG", "HDL", "CHOLHDL", "VLDL", "LDLCALC", "LDLDIRECT"    Other studies Reviewed: Additional studies/ records that were reviewed today include:  Review of the above records demonstrates:      04/12/2018   11:13 AM  PAD Screen  Previous PAD dx? No  Previous surgical  procedure? No  Pain with walking? No  Feet/toe relief with dangling? No  Painful, non-healing ulcers? No  Extremities discolored? No      ASSESSMENT AND PLAN:    ICD-10-CM   1. Primary hypertension  I10 metoprolol (TOPROL XL) 200 MG 24 hr tablet    2. PAD (peripheral artery disease) (HCC)  I73.9 metoprolol (TOPROL XL) 200 MG 24 hr tablet    3. Acute HFrEF (heart failure with reduced ejection fraction) (HCC)  I50.21 metoprolol (TOPROL XL) 200 MG 24 hr tablet   LVEF 35-40 %, on echo. GDMT will be  continued    4. HFrEF (heart failure with reduced ejection fraction) (HCC)  I50.20 metoprolol (TOPROL XL) 200 MG 24 hr tablet   Continue farxiga, and take extra dosage lasix when SOB.    5. SOB (shortness of breath)  R06.02 metoprolol (TOPROL XL) 200 MG 24 hr tablet       Problem List Items Addressed This Visit       Cardiovascular and Mediastinum   Acute HFrEF (heart failure with reduced ejection fraction) (HCC)   Relevant Medications   metoprolol (TOPROL XL) 200 MG 24 hr tablet   Hypertension - Primary   Relevant Medications   metoprolol (TOPROL XL) 200 MG 24 hr tablet   PAD (peripheral artery disease) (HCC)   Relevant Medications   metoprolol (TOPROL XL) 200 MG 24 hr tablet   Other Visit Diagnoses     HFrEF (heart failure with reduced ejection fraction) (HCC)       Continue farxiga, and take extra dosage lasix when SOB.   Relevant Medications   metoprolol (TOPROL XL) 200 MG 24 hr tablet   SOB (shortness of breath)       Relevant Medications   metoprolol (TOPROL XL) 200 MG 24 hr tablet          Disposition:   Return in about 2 months (around 11/19/2023).    Total time spent: 30 minutes  Signed,  Adrian Blackwater, MD  09/19/2023 10:26 AM    [facility]

## 2023-09-20 ENCOUNTER — Ambulatory Visit: Payer: Medicare HMO

## 2023-09-22 ENCOUNTER — Ambulatory Visit: Payer: Medicare HMO

## 2023-09-23 ENCOUNTER — Ambulatory Visit
Admission: RE | Admit: 2023-09-23 | Discharge: 2023-09-23 | Disposition: A | Discharge status: Home / Self Care | Payer: Medicare HMO | Source: Ambulatory Visit | Attending: Sports Medicine | Admitting: Sports Medicine

## 2023-09-23 DIAGNOSIS — M50323 Other cervical disc degeneration at C6-C7 level: Secondary | ICD-10-CM

## 2023-09-23 DIAGNOSIS — M542 Cervicalgia: Secondary | ICD-10-CM

## 2023-09-23 DIAGNOSIS — M47812 Spondylosis without myelopathy or radiculopathy, cervical region: Secondary | ICD-10-CM

## 2023-10-03 ENCOUNTER — Telehealth: Payer: Self-pay | Admitting: Family

## 2023-10-03 ENCOUNTER — Encounter: Payer: Medicare HMO | Admitting: Family

## 2023-10-03 NOTE — Telephone Encounter (Signed)
Patient did not show for his Heart Failure Clinic appointment on 10/03/23.

## 2023-10-20 ENCOUNTER — Encounter: Payer: Self-pay | Admitting: Cardiovascular Disease

## 2023-10-20 ENCOUNTER — Emergency Department: Payer: Medicare HMO

## 2023-10-20 ENCOUNTER — Emergency Department
Admission: EM | Admit: 2023-10-20 | Discharge: 2023-10-20 | Disposition: A | Discharge status: Home / Self Care | Payer: Medicare HMO | Attending: Emergency Medicine | Admitting: Emergency Medicine

## 2023-10-20 ENCOUNTER — Other Ambulatory Visit: Payer: Self-pay

## 2023-10-20 ENCOUNTER — Ambulatory Visit (INDEPENDENT_AMBULATORY_CARE_PROVIDER_SITE_OTHER): Payer: Medicare HMO | Admitting: Cardiovascular Disease

## 2023-10-20 VITALS — BP 106/82 | HR 110 | Ht 73.0 in | Wt 315.0 lb

## 2023-10-20 DIAGNOSIS — I509 Heart failure, unspecified: Secondary | ICD-10-CM | POA: Diagnosis not present

## 2023-10-20 DIAGNOSIS — I10 Essential (primary) hypertension: Secondary | ICD-10-CM

## 2023-10-20 DIAGNOSIS — I739 Peripheral vascular disease, unspecified: Secondary | ICD-10-CM

## 2023-10-20 DIAGNOSIS — I5043 Acute on chronic combined systolic (congestive) and diastolic (congestive) heart failure: Secondary | ICD-10-CM | POA: Diagnosis not present

## 2023-10-20 DIAGNOSIS — I5021 Acute systolic (congestive) heart failure: Secondary | ICD-10-CM

## 2023-10-20 DIAGNOSIS — I249 Acute ischemic heart disease, unspecified: Secondary | ICD-10-CM | POA: Diagnosis not present

## 2023-10-20 DIAGNOSIS — K2100 Gastro-esophageal reflux disease with esophagitis, without bleeding: Secondary | ICD-10-CM

## 2023-10-20 DIAGNOSIS — R0602 Shortness of breath: Secondary | ICD-10-CM | POA: Diagnosis present

## 2023-10-20 DIAGNOSIS — R0600 Dyspnea, unspecified: Secondary | ICD-10-CM | POA: Insufficient documentation

## 2023-10-20 LAB — CBC
HCT: 47 % (ref 39.0–52.0)
Hemoglobin: 15.6 g/dL (ref 13.0–17.0)
MCH: 30.5 pg (ref 26.0–34.0)
MCHC: 33.2 g/dL (ref 30.0–36.0)
MCV: 92 fL (ref 80.0–100.0)
Platelets: 210 10*3/uL (ref 150–400)
RBC: 5.11 MIL/uL (ref 4.22–5.81)
RDW: 14 % (ref 11.5–15.5)
WBC: 8.2 10*3/uL (ref 4.0–10.5)
nRBC: 0 % (ref 0.0–0.2)

## 2023-10-20 LAB — TROPONIN I (HIGH SENSITIVITY): Troponin I (High Sensitivity): 24 ng/L — ABNORMAL HIGH (ref <18)

## 2023-10-20 LAB — COMPREHENSIVE METABOLIC PANEL
ALT: 22 U/L (ref 0–44)
AST: 30 U/L (ref 15–41)
Albumin: 3.5 g/dL (ref 3.5–5.0)
Alkaline Phosphatase: 55 U/L (ref 38–126)
Anion gap: 13 (ref 5–15)
BUN: 22 mg/dL (ref 8–23)
CO2: 21 mmol/L — ABNORMAL LOW (ref 22–32)
Calcium: 9 mg/dL (ref 8.9–10.3)
Chloride: 104 mmol/L (ref 98–111)
Creatinine, Ser: 0.96 mg/dL (ref 0.61–1.24)
GFR, Estimated: >60 mL/min (ref >60)
Glucose, Bld: 154 mg/dL — ABNORMAL HIGH (ref 70–99)
Potassium: 3.3 mmol/L — ABNORMAL LOW (ref 3.5–5.1)
Sodium: 138 mmol/L (ref 135–145)
Total Bilirubin: 0.6 mg/dL (ref <1.2)
Total Protein: 6.9 g/dL (ref 6.5–8.1)

## 2023-10-20 LAB — BRAIN NATRIURETIC PEPTIDE: B Natriuretic Peptide: 818.5 pg/mL — ABNORMAL HIGH (ref 0.0–100.0)

## 2023-10-20 MED ORDER — FUROSEMIDE 40 MG PO TABS: 40.0000 mg | ORAL_TABLET | Freq: Every day | ORAL | 3 refills | Status: DC

## 2023-10-20 MED ORDER — PANTOPRAZOLE SODIUM 40 MG PO TBEC: 40.0000 mg | DELAYED_RELEASE_TABLET | Freq: Every day | ORAL | 1 refills | Status: DC

## 2023-10-20 NOTE — ED Notes (Signed)
Pt refused respiratory swab. "Ain't nothing going up my nose."

## 2023-10-20 NOTE — Progress Notes (Signed)
Cardiology Office Note   Date:  10/20/2023   ID:  Seikichi Coriano, DOB [birth year], MRN [id number]  PCP:  Sherrie Mustache, MD  Cardiologist:  Adrian Blackwater, MD      History of Present Illness: Gadge Grgurich is a 72 y.o. male who presents for  Chief Complaint  Patient presents with   Shortness of Breath    Went to ER because of SOB and now here for f/u  Shortness of Breath This is a new problem. The current episode started today. The problem has been waxing and waning. Pertinent negatives include no chest pain.      Past Medical History:  Diagnosis Date   Arthritis    Cardiomyopathy (HCC)    a. 08/2016 Echo: EF 30-35%, diff HK; b. 03/2018 Ex MV (Jadali): Ex time 2:00, large anter defect, EF 24%; c. 03/2018 Echo: EF 20-25%, diff HK. Gr1 DD; d. Refused cath.   Chronic back pain    Chronic combined systolic (congestive) and diastolic (congestive) heart failure (HCC)    a. 08/2016 Echo: EF 30-35%, diff HK, mild to mod MR, mild BAE; b. 03/2018 Echo: EF 20-25%, diff HK. Gr1 DD. MIldly dil Ao root. Mild BAE. Mildly reduced RV fxn.   GERD (gastroesophageal reflux disease)    Gout    Hypertension    Obesity      Past Surgical History:  Procedure Laterality Date   EPIDURAL BLOCK INJECTION     chronic back pain    LEFT HEART CATH AND CORONARY ANGIOGRAPHY Left 12/27/2019   Procedure: LEFT HEART CATH AND CORONARY ANGIOGRAPHY;  Surgeon: Laurier Nancy, MD;  Location: [facility] INV[facility]IVE [facility];  Service: Cardiovascular;  Laterality: Left;   TONSILLECTOMY       Current Outpatient Medications  Medication Sig Dispense Refill   acetaminophen (TYLENOL) 325 MG tablet Take 500 mg by mouth every 6 (six) hours as needed for moderate pain or mild pain.     albuterol (VENTOLIN HFA) 108 (90 Base) MCG/ACT inhaler Inhale into the lungs.     ascorbic acid (VITAMIN C) 500 MG tablet Take 500 mg by mouth daily.     atorvastatin (LIPITOR) 10 MG tablet Take 1 tablet (10 mg total) by mouth daily. 30  tablet 2   CORLANOR 7.5 MG TABS tablet Take 7.5 mg by mouth 2 (two) times daily.     dapagliflozin propanediol (FARXIGA) 10 MG TABS tablet TAKE 1 TABLET BY MOUTH DAILY 30 tablet 6   digoxin (LANOXIN) 0.125 MG tablet Take 1 tablet (125 mcg total) by mouth daily. 30 tablet 11   methimazole (TAPAZOLE) 5 MG tablet Take 7.5 mg by mouth daily.     metoprolol (TOPROL XL) 200 MG 24 hr tablet Take 1 tablet (200 mg total) by mouth daily. 30 tablet 11   Multiple Vitamin (MULTIVITAMIN WITH MINERALS) TABS tablet Take 1 tablet by mouth daily.     pantoprazole (PROTONIX) 40 MG tablet Take 1 tablet (40 mg total) by mouth daily. 30 tablet 1   sacubitril-valsartan (ENTRESTO) 97-103 MG TAKE 1 TABLET BY MOUTH TWICE A DAY 60 tablet 6   spironolactone (ALDACTONE) 25 MG tablet TAKE 1/2 TABLET BY MOUTH DAILY 15 tablet 6   aspirin 81 MG tablet Take 1 tablet (81 mg total) by mouth daily. (Patient not taking: Reported on 10/06/2022) 30 tablet 2   ergocalciferol (VITAMIN D2) 1.25 MG (50000 UT) capsule Take 50,000 Units by mouth once a week. (Patient not taking: Reported on 06/09/2023)  furosemide (LASIX) 40 MG tablet Take 1 tablet (40 mg total) by mouth daily. 90 tablet 3   No current facility-administered medications for this visit.    Allergies:   Patient has no known allergies.    Social History:   reports that he quit smoking about 46 years ago. His smoking use included cigarettes. He started smoking about 47 years ago. He has a 3 pack-year smoking history. He has never used smokeless tobacco. He reports that he does not drink alcohol and does not use drugs.   Family History:  family history is not on file.    ROS:     Review of Systems  Constitutional: Negative.   HENT: Negative.    Eyes: Negative.   Respiratory:  Positive for shortness of breath.   Cardiovascular:  Negative for chest pain.  Gastrointestinal: Negative.   Genitourinary: Negative.   Musculoskeletal: Negative.   Skin: Negative.    Neurological: Negative.   Endo/Heme/Allergies: Negative.   Psychiatric/Behavioral: Negative.    All other systems reviewed and are negative.     All other systems are reviewed and negative.    PHYSICAL EXAM: VS:  BP 106/82   Pulse (!) 110   Ht 6\' 1"  (1.854 m)   Wt (!) 315 lb (142.9 kg)   SpO2 97%   BMI 41.56 kg/m  , BMI Body mass index is 41.56 kg/m. Last weight:  Wt Readings from Last 3 Encounters:  10/20/23 (!) 315 lb (142.9 kg)  09/19/23 (!) 304 lb 6.4 oz (138.1 kg)  09/04/23 (!) 306 lb (138.8 kg)     Physical Exam Vitals reviewed.  Constitutional:      Appearance: Normal appearance. He is normal weight.  HENT:     Head: Normocephalic.     Nose: Nose normal.     Mouth/Throat:     Mouth: Mucous membranes are moist.  Eyes:     Pupils: Pupils are equal, round, and reactive to light.  Cardiovascular:     Rate and Rhythm: Normal rate and regular rhythm.     Pulses: Normal pulses.     Heart sounds: Normal heart sounds.  Pulmonary:     Effort: Pulmonary effort is normal.  Abdominal:     General: Abdomen is flat. Bowel sounds are normal.  Musculoskeletal:        General: Normal range of motion.     Cervical back: Normal range of motion.  Skin:    General: Skin is warm.  Neurological:     General: No focal deficit present.     Mental Status: He is alert.  Psychiatric:        Mood and Affect: Mood normal.       EKG:   Recent Labs: 05/19/2023: Magnesium 2.3 10/20/2023: ALT 22; B Natriuretic Peptide 818.5; BUN 22; Creatinine, Ser 0.96; Hemoglobin 15.6; Platelets 210; Potassium 3.3; Sodium 138    Lipid Panel No results found for: "CHOL", "TRIG", "HDL", "CHOLHDL", "VLDL", "LDLCALC", "LDLDIRECT"    Other studies Reviewed: Additional studies/ records that were reviewed today include:  Review of the above records demonstrates:      04/12/2018   11:13 AM  PAD Screen  Previous PAD dx? No  Previous surgical procedure? No  Pain with walking? No   Feet/toe relief with dangling? No  Painful, non-healing ulcers? No  Extremities discolored? No      ASSESSMENT AND PLAN:    ICD-10-CM   1. Acute coronary syndrome (HCC)  I24.9 furosemide (LASIX) 40 MG tablet  pantoprazole (PROTONIX) 40 MG tablet    2. Acute on chronic combined systolic and diastolic congestive heart failure (HCC)  I50.43 furosemide (LASIX) 40 MG tablet    pantoprazole (PROTONIX) 40 MG tablet    3. Acute HFrEF (heart failure with reduced ejection fraction) (HCC)  I50.21 furosemide (LASIX) 40 MG tablet    pantoprazole (PROTONIX) 40 MG tablet   ran out of lasix and advise taking it and not to miss    4. Primary hypertension  I10 furosemide (LASIX) 40 MG tablet    pantoprazole (PROTONIX) 40 MG tablet    5. PAD (peripheral artery disease) (HCC)  I73.9 furosemide (LASIX) 40 MG tablet    pantoprazole (PROTONIX) 40 MG tablet    6. Gastroesophageal reflux disease with esophagitis, unspecified whether hemorrhage  K21.00 furosemide (LASIX) 40 MG tablet    pantoprazole (PROTONIX) 40 MG tablet   add protonix       Problem List Items Addressed This Visit       Cardiovascular and Mediastinum   Acute coronary syndrome (HCC) - Primary   Relevant Medications   furosemide (LASIX) 40 MG tablet   pantoprazole (PROTONIX) 40 MG tablet   CHF exacerbation (HCC)   Relevant Medications   furosemide (LASIX) 40 MG tablet   pantoprazole (PROTONIX) 40 MG tablet   Acute HFrEF (heart failure with reduced ejection fraction) (HCC)   Relevant Medications   furosemide (LASIX) 40 MG tablet   pantoprazole (PROTONIX) 40 MG tablet   Hypertension   Relevant Medications   furosemide (LASIX) 40 MG tablet   pantoprazole (PROTONIX) 40 MG tablet   PAD (peripheral artery disease) (HCC)   Relevant Medications   furosemide (LASIX) 40 MG tablet   pantoprazole (PROTONIX) 40 MG tablet     Digestive   GERD (gastroesophageal reflux disease)   Relevant Medications   furosemide (LASIX) 40 MG  tablet   pantoprazole (PROTONIX) 40 MG tablet       Disposition:   Return in about 2 weeks (around 11/03/2023).    Total time spent: 30 minutes  Signed,  Adrian Blackwater, MD  10/20/2023 3:00 PM    [facility]

## 2023-10-20 NOTE — ED Triage Notes (Signed)
Pt presents to ER with c/o diff breathing that started around one hour ago while he was lying down in bed.  Pt reports he has a hx of CHF, and has been taking lasix.  Pt is tachypneic in triage, but is able to speak in complete sentences in triage.  Pt states he is having concerns that his lasix could be causing him to have increased trouble breathing.  Pt is otherwise A&O x4 and in NAD at this time.

## 2023-10-20 NOTE — ED Notes (Signed)
OK to DC patient without drawing 2nd troponin per EDP

## 2023-10-20 NOTE — Discharge Instructions (Signed)
Your workup in the Emergency Department today was reassuring.  We did not find any specific abnormalities.  We recommend you drink plenty of fluids, take your regular medications and/or any new ones prescribed today, and follow up with the doctor(s) listed in these documents as recommended.  Return to the Emergency Department if you develop new or worsening symptoms that concern you.  

## 2023-10-20 NOTE — ED Provider Notes (Signed)
[facility] Provider Note    Event Date/Time   First MD Initiated Contact with Patient 10/20/23 0600     (approximate)   History   Shortness of Breath   HPI Ronald Horne is a 72 y.o. male who presents for evaluation of shortness of breath.  He says started about an hour ago while he was lying down in bed.  He has a diagnosis of CHF and he has been taking Lasix.  He looked on the side effects of the Lasix and saw that it can cause shortness of breath so he wondered if maybe that was the problem.  He said he was breathing really rapidly before he came in and he took some of his albuterol inhaler and he is not sure if that helped or not.  He does not think he has COPD but is not sure.  He has not had any significant weight gain or swelling in his legs recently.  No chest pain.  No recent fever.     Physical Exam   Triage Vital Signs: ED Triage Vitals [10/20/23 0537]  Encounter Vitals Group     BP (!) 139/105     Systolic BP Percentile      Diastolic BP Percentile      Pulse Rate (!) 129     Resp (!) 26     Temp 97.8 F (36.6 C)     Temp Source Oral     SpO2 95 %     Weight      Height      Head Circumference      Peak Flow      Pain Score      Pain Loc      Pain Education      Exclude from Growth Chart     Most recent vital signs: Vitals:   10/20/23 0537 10/20/23 0630  BP: (!) 139/105 99/70  Pulse: (!) 129 (!) 108  Resp: (!) 26 19  Temp: 97.8 F (36.6 C)   SpO2: 95% 95%    General: Awake, no distress at this time. CV:  Good peripheral perfusion.  Tachycardia at about 130.  Normal heart sounds. Resp:  Normal effort. Speaking easily and comfortably, no accessory muscle usage nor intercostal retractions.  Lungs are clear to auscultation with no wheezing, rales, nor rhonchi. Abd:  No distention.  Obese, nontender.   ED Results / Procedures / Treatments   Labs (all labs ordered are listed, but only abnormal results are displayed) Labs  Reviewed  COMPREHENSIVE METABOLIC PANEL - Abnormal; Notable for the following components:      Result Value   Potassium 3.3 (*)    CO2 21 (*)    Glucose, Bld 154 (*)    All other components within normal limits  BRAIN NATRIURETIC PEPTIDE - Abnormal; Notable for the following components:   B Natriuretic Peptide 818.5 (*)    All other components within normal limits  TROPONIN I (HIGH SENSITIVITY) - Abnormal; Notable for the following components:   Troponin I (High Sensitivity) 24 (*)    All other components within normal limits  CBC  TROPONIN I (HIGH SENSITIVITY)     EKG  ED ECG REPORT I, Loleta Rose, the attending physician, personally viewed and interpreted this ECG.  Date: 10/20/2023 EKG Time: 5:33 AM Rate: 129 Rhythm: A flutter with variable AV block QRS Axis: normal Intervals: Left bundle branch block ST/T Wave abnormalities: Non-specific ST segment / T-wave changes, but no clear  evidence of acute ischemia. Narrative Interpretation: no definitive evidence of acute ischemia; does not meet STEMI criteria.    RADIOLOGY I viewed and interpreted the patient's chest x-ray.  No pulmonary edema nor infiltrate.   PROCEDURES:  Critical Care performed: No  .1-3 Lead EKG Interpretation  Performed by: Loleta Rose, MD Authorized by: Loleta Rose, MD     Interpretation: abnormal     ECG rate:  128   ECG rate assessment: tachycardic     Rhythm: atrial flutter     Ectopy: none     Conduction: normal       IMPRESSION / MDM / ASSESSMENT AND PLAN / ED COURSE  I reviewed the triage vital signs and the nursing notes.                              Differential diagnosis includes, but is not limited to, CHF exacerbation, COPD, pneumonia, viral illness.  Patient's presentation is most consistent with acute presentation with potential threat to life or bodily function.  Labs/studies ordered: Respiratory viral panel, CMP, high-sensitivity troponin, CBC, BNP, chest x-ray,  EKG  Interventions/Medications given:  Medications - No data to display  (Note:  hospital course my include additional interventions and/or labs/studies not listed above.)   Patient says he was in distress earlier and it was witnessed by the triage nurses, but he is much better now.  No wheezing and moving good air.  However he is still tachycardic and slightly tachypneic even though he is not having any increased work of breathing.  His main medical history is CHF so I will evaluate him broadly but with an eye towards the possibility of volume overload.  No indication for emergent intervention at this time.  The patient is on the cardiac monitor to evaluate for evidence of arrhythmia and/or significant heart rate changes.   Clinical Course as of 10/20/23 0749  Fri Oct 20, 2023  1610 Generally reassuring labs but the high-sensitivity troponin is 24, could be indicative of demand ischemia in the setting of recent respiratory distress.  Patient is not having chest pain and ACS is unlikely. [CF]  0740 The patient is in no distress.  His heart rate stabilized and before I walked in the room he was on the monitor at under 100 bpm.  After I walked in he became more tachycardic when he was talking to me in a very animated fashion and enthusiastically about all of his medications and health problems.  However, he is not in distress and said he feels fine and asked how long I was going to be because he has another appointment he needs to get to.  I considered repeating his high-sensitivity troponin but given that he is having no chest pain I do not think it would be of value.  He is already on Farxiga, furosemide, and many other medications and he is familiar with all of them and has them with him.  He will continue taking his medications and follow-up with his primary care doctor who is coordinating his care.  I gave strict return precautions and he agrees with the plan.  He has been to his pulmonologist  recently, his cardiologist, and his primary care doctor, and I am confident he will follow-up.  I considered hospitalization but there is no indication he would benefit from hospitalization at this time and no evidence of an acute or emergent medical condition.  He agrees with the plan. [  CF]    Clinical Course User Index [CF] Loleta Rose, MD     FINAL CLINICAL IMPRESSION(S) / ED DIAGNOSES   Final diagnoses:  Acute dyspnea     Rx / DC Orders   ED Discharge Orders     None        Note:  This document was prepared using Dragon voice recognition software and may include unintentional dictation errors.   Loleta Rose, MD 10/20/23 [phone]

## 2023-11-02 ENCOUNTER — Telehealth: Payer: Self-pay | Admitting: Family

## 2023-11-02 ENCOUNTER — Encounter: Payer: Medicare HMO | Admitting: Family

## 2023-11-02 NOTE — Progress Notes (Deleted)
 PCP: Jadali, Fayegh, MD (last seen 05/24) Primary Cardiologist: Fernand Alter, MD (last seen 05/24)  HPI:  Mr Arliss is a 73 y/o male with a history of HTN, GERD, gout, hepatitis, hyperlipidemia, PVD/ PAD, anxiety and possible sleep apnea, previous tobacco use and chronic heart failure.   Echo 07/31/22: EF of 30-35% along with mild LVH, moderate LAE and mild MR.   LHC 12/27/19: Normal coronaries with mild to moderate LV dysfunction   Has not been admitted or been in the ED in the last 6 months.   He presents today for a HF follow-up visit with a chief complaint of minimal SOB with exertion. Chronic in nature. Has associated occasional palpitations, fatigue & anxiety along with this. Denies cough, chest pain, pedal edema, abdominal distention or dizziness. Has fluctuating weight at home. Continues to have a lot of stress at home as numerous family members continue to live with him.   Says that he's been told that he snores but has been unable to get a sleep study. Says initially his insurance wouldn't approve it but he's now unsure of the status of this.   Currently receiving PT on his right shoulder due to suspected subacromial impingement with possible rotator cuff tendinopathy     ROS: All systems negative except as listed in HPI, PMH and Problem List.  SH:  Social History   Socioeconomic History   Marital status: Single    Spouse name: Not on file   Number of children: Not on file   Years of education: Not on file   Highest education level: Not on file  Occupational History   Not on file  Tobacco Use   Smoking status: Former    Current packs/day: 0.00    Average packs/day: 3.0 packs/day for 1 year (3.0 ttl pk-yrs)    Types: Cigarettes    Start date: 12/13/1975    Quit date: 12/12/1976    Years since quitting: 46.9   Smokeless tobacco: Never  Vaping Use   Vaping status: Never Used  Substance and Sexual Activity   Alcohol use: No   Drug use: No   Sexual activity: Not on  file  Other Topics Concern   Not on file  Social History Narrative   Not on file   Social Drivers of Health   Financial Resource Strain: Not on file  Food Insecurity: No Food Insecurity (07/31/2022)   Hunger Vital Sign    Worried About Running Out of Food in the Last Year: Never true    Ran Out of Food in the Last Year: Never true  Transportation Needs: No Transportation Needs (07/31/2022)   PRAPARE - Administrator, Civil Service (Medical): No    Lack of Transportation (Non-Medical): No  Physical Activity: Not on file  Stress: Not on file  Social Connections: Not on file  Intimate Partner Violence: Not At Risk (07/31/2022)   Humiliation, Afraid, Rape, and Kick questionnaire    Fear of Current or Ex-Partner: No    Emotionally Abused: No    Physically Abused: No    Sexually Abused: No    FH: No family history on file.  Past Medical History:  Diagnosis Date   Arthritis    Cardiomyopathy (HCC)    a. 08/2016 Echo: EF 30-35%, diff HK; b. 03/2018 Ex MV (Jadali): Ex time 2:00, large anter defect, EF 24%; c. 03/2018 Echo: EF 20-25%, diff HK. Gr1 DD; d. Refused cath.   Chronic back pain    Chronic combined systolic (  congestive) and diastolic (congestive) heart failure (HCC)    a. 08/2016 Echo: EF 30-35%, diff HK, mild to mod MR, mild BAE; b. 03/2018 Echo: EF 20-25%, diff HK. Gr1 DD. MIldly dil Ao root. Mild BAE. Mildly reduced RV fxn.   GERD (gastroesophageal reflux disease)    Gout    Hypertension    Obesity     Current Outpatient Medications  Medication Sig Dispense Refill   acetaminophen  (TYLENOL ) 325 MG tablet Take 500 mg by mouth every 6 (six) hours as needed for moderate pain or mild pain.     albuterol  (VENTOLIN  HFA) 108 (90 Base) MCG/ACT inhaler Inhale into the lungs.     ascorbic acid (VITAMIN C) 500 MG tablet Take 500 mg by mouth daily.     aspirin  81 MG tablet Take 1 tablet (81 mg total) by mouth daily. (Patient not taking: Reported on 10/06/2022) 30 tablet 2    atorvastatin  (LIPITOR) 10 MG tablet Take 1 tablet (10 mg total) by mouth daily. 30 tablet 2   CORLANOR  7.5 MG TABS tablet Take 7.5 mg by mouth 2 (two) times daily.     dapagliflozin  propanediol (FARXIGA ) 10 MG TABS tablet TAKE 1 TABLET BY MOUTH DAILY 30 tablet 6   digoxin  (LANOXIN ) 0.125 MG tablet Take 1 tablet (125 mcg total) by mouth daily. 30 tablet 11   ergocalciferol (VITAMIN D2) 1.25 MG (50000 UT) capsule Take 50,000 Units by mouth once a week. (Patient not taking: Reported on 06/09/2023)     furosemide  (LASIX ) 40 MG tablet Take 1 tablet (40 mg total) by mouth daily. 90 tablet 3   methimazole  (TAPAZOLE ) 5 MG tablet Take 7.5 mg by mouth daily.     metoprolol  (TOPROL  XL) 200 MG 24 hr tablet Take 1 tablet (200 mg total) by mouth daily. 30 tablet 11   Multiple Vitamin (MULTIVITAMIN WITH MINERALS) TABS tablet Take 1 tablet by mouth daily.     pantoprazole  (PROTONIX ) 40 MG tablet Take 1 tablet (40 mg total) by mouth daily. 30 tablet 1   sacubitril -valsartan  (ENTRESTO ) 97-103 MG TAKE 1 TABLET BY MOUTH TWICE A DAY 60 tablet 6   spironolactone  (ALDACTONE ) 25 MG tablet TAKE 1/2 TABLET BY MOUTH DAILY 15 tablet 6   No current facility-administered medications for this visit.   There were no vitals filed for this visit.  Wt Readings from Last 3 Encounters:  10/20/23 (!) 315 lb (142.9 kg)  09/19/23 (!) 304 lb 6.4 oz (138.1 kg)  09/04/23 (!) 306 lb (138.8 kg)   Lab Results  Component Value Date   CREATININE 0.96 10/20/2023   CREATININE 1.00 05/19/2023   CREATININE 1.00 05/19/2023   PHYSICAL EXAM:  General:  Well appearing. No resp difficulty HEENT: normal Neck: supple. JVP flat. No lymphadenopathy or thryomegaly appreciated. Cor: PMI normal. Regular rate & rhythm. No rubs, gallops or murmurs. Lungs: clear Abdomen: soft, nontender, nondistended. No hepatosplenomegaly. No bruits or masses.  Extremities: no cyanosis, clubbing, rash, edema Neuro: alert & oriented x 3, cranial nerves grossly  intact. Moves all 4 extremities w/o difficulty. Affect pleasant. Anxious   ECG: not done   ASSESSMENT & PLAN:  1: Chronic heart failure with reduced ejection fraction- - NYHA class II - euvolemic today - weighing daily; reminded to call for an overnight weight gain of > 2 pounds or a weekly weight gain of > 5 pounds - weight up 1 pound from last visit here 6 months ago - not adding salt and trying to eat low sodium foods -  keeping daily fluid intake to 60-64 ouncess - continue entresto  97/103mg  BID - continue farxiga  10mg  daily - continue metoprolol  200mg  daily - continue spironolactone  12.5mg  daily; current BP will not allow for titration - continue corlanor  7.5mg  BID - continue digoxin  0.125mg  daily - continue furosemide  40mg  daily - saw cardiology Orvil) 05/24; returns in 1 month - BNP 07/30/22 was 591.4  2: HTN- - BP 102/84 - saw PCP Shoshana) about a month ago; returns later this week  - BMP 08/01/22 reviewed and showed sodium 140, potassium 3.8, creatinine 1.10 and GFR >60 - will call PCP office to request most recent lab resuls  3: Anxiety- - reports trying to limit his stress but says it's difficult because of numerous family members that share his home and he feels like he doesn't get any respect from them - voices understanding of how his mental health can certainly impact his physical heath  4: PVD/ PAD- - saw vascular (Dew) 11/23 - continue atorvastatin  10mg  daily  5: Sleep apnea- - saw pulmonology Burnie) 01/24 - will get home sleep study set up  Return in 6 months, sooner if needed.

## 2023-11-02 NOTE — Telephone Encounter (Signed)
 Patient did not show for his [facility] appointment on 11/02/23.

## 2023-11-03 ENCOUNTER — Ambulatory Visit (INDEPENDENT_AMBULATORY_CARE_PROVIDER_SITE_OTHER): Payer: Medicare HMO | Admitting: Cardiovascular Disease

## 2023-11-03 ENCOUNTER — Encounter: Payer: Self-pay | Admitting: Cardiovascular Disease

## 2023-11-03 VITALS — BP 135/78 | HR 105 | Ht 73.0 in | Wt 305.3 lb

## 2023-11-03 DIAGNOSIS — I10 Essential (primary) hypertension: Secondary | ICD-10-CM | POA: Diagnosis not present

## 2023-11-03 DIAGNOSIS — I739 Peripheral vascular disease, unspecified: Secondary | ICD-10-CM | POA: Diagnosis not present

## 2023-11-03 DIAGNOSIS — I5021 Acute systolic (congestive) heart failure: Secondary | ICD-10-CM | POA: Diagnosis not present

## 2023-11-03 DIAGNOSIS — K2100 Gastro-esophageal reflux disease with esophagitis, without bleeding: Secondary | ICD-10-CM | POA: Diagnosis not present

## 2023-11-03 NOTE — Progress Notes (Signed)
 Cardiology Office Note   Date:  11/03/2023   ID:  Ronald Horne, DOB 06-Nov-1950, MRN [id number]  PCP:  Weyman Bright, MD  Cardiologist:  Denyse Bathe, MD      History of Present Illness: Ronald Horne is a 73 y.o. male who presents for No chief complaint on file.   Has neck pain, cold not take protonix       Past Medical History:  Diagnosis Date   Arthritis    Cardiomyopathy (HCC)    a. 08/2016 Echo: EF 30-35%, diff HK; b. 03/2018 Ex MV (Jadali): Ex time 2:00, large anter defect, EF 24%; c. 03/2018 Echo: EF 20-25%, diff HK. Gr1 DD; d. Refused cath.   Chronic back pain    Chronic combined systolic (congestive) and diastolic (congestive) heart failure (HCC)    a. 08/2016 Echo: EF 30-35%, diff HK, mild to mod MR, mild BAE; b. 03/2018 Echo: EF 20-25%, diff HK. Gr1 DD. MIldly dil Ao root. Mild BAE. Mildly reduced RV fxn.   GERD (gastroesophageal reflux disease)    Gout    Hypertension    Obesity      Past Surgical History:  Procedure Laterality Date   EPIDURAL BLOCK INJECTION     chronic back pain    LEFT HEART CATH AND CORONARY ANGIOGRAPHY Left 12/27/2019   Procedure: LEFT HEART CATH AND CORONARY ANGIOGRAPHY;  Surgeon: Bathe Denyse LABOR, MD;  Location: [facility];  Service: Cardiovascular;  Laterality: Left;   TONSILLECTOMY       Current Outpatient Medications  Medication Sig Dispense Refill   gabapentin (NEURONTIN) 300 MG capsule Take 300 mg by mouth 2 (two) times daily.     acetaminophen  (TYLENOL ) 325 MG tablet Take 500 mg by mouth every 6 (six) hours as needed for moderate pain or mild pain.     albuterol  (VENTOLIN  HFA) 108 (90 Base) MCG/ACT inhaler Inhale into the lungs.     ascorbic acid (VITAMIN C) 500 MG tablet Take 500 mg by mouth daily.     aspirin  81 MG tablet Take 1 tablet (81 mg total) by mouth daily. (Patient not taking: Reported on 10/06/2022) 30 tablet 2   atorvastatin  (LIPITOR) 10 MG tablet Take 1 tablet (10 mg total) by mouth daily. 30 tablet 2    CORLANOR  7.5 MG TABS tablet Take 7.5 mg by mouth 2 (two) times daily.     dapagliflozin  propanediol (FARXIGA ) 10 MG TABS tablet TAKE 1 TABLET BY MOUTH DAILY 30 tablet 6   digoxin  (LANOXIN ) 0.125 MG tablet Take 1 tablet (125 mcg total) by mouth daily. 30 tablet 11   ergocalciferol (VITAMIN D2) 1.25 MG (50000 UT) capsule Take 50,000 Units by mouth once a week. (Patient not taking: Reported on 06/09/2023)     furosemide  (LASIX ) 40 MG tablet Take 1 tablet (40 mg total) by mouth daily. 90 tablet 3   methimazole  (TAPAZOLE ) 5 MG tablet Take 7.5 mg by mouth daily.     metoprolol  (TOPROL  XL) 200 MG 24 hr tablet Take 1 tablet (200 mg total) by mouth daily. 30 tablet 11   Multiple Vitamin (MULTIVITAMIN WITH MINERALS) TABS tablet Take 1 tablet by mouth daily.     pantoprazole  (PROTONIX ) 40 MG tablet Take 1 tablet (40 mg total) by mouth daily. 30 tablet 1   sacubitril -valsartan  (ENTRESTO ) 97-103 MG TAKE 1 TABLET BY MOUTH TWICE A DAY 60 tablet 6   spironolactone  (ALDACTONE ) 25 MG tablet TAKE 1/2 TABLET BY MOUTH DAILY 15 tablet 6   No current  facility-administered medications for this visit.    Allergies:   Patient has no known allergies.    Social History:   reports that he quit smoking about 46 years ago. His smoking use included cigarettes. He started smoking about 47 years ago. He has a 3 pack-year smoking history. He has never used smokeless tobacco. He reports that he does not drink alcohol and does not use drugs.   Family History:  family history is not on file.    ROS:     Review of Systems  Constitutional: Negative.   HENT: Negative.    Eyes: Negative.   Respiratory: Negative.    Gastrointestinal: Negative.   Genitourinary: Negative.   Musculoskeletal: Negative.   Skin: Negative.   Neurological: Negative.   Endo/Heme/Allergies: Negative.   Psychiatric/Behavioral: Negative.    All other systems reviewed and are negative.     All other systems are reviewed and negative.     PHYSICAL EXAM: VS:  BP 135/78   Pulse (!) 105   Ht 6' 1 (1.854 m)   Wt (!) 305 lb 4.8 oz (138.5 kg)   SpO2 98%   BMI 40.28 kg/m  , BMI Body mass index is 40.28 kg/m. Last weight:  Wt Readings from Last 3 Encounters:  11/03/23 (!) 305 lb 4.8 oz (138.5 kg)  10/20/23 (!) 315 lb (142.9 kg)  09/19/23 (!) 304 lb 6.4 oz (138.1 kg)     Physical Exam Vitals reviewed.  Constitutional:      Appearance: Normal appearance. He is normal weight.  HENT:     Head: Normocephalic.     Nose: Nose normal.     Mouth/Throat:     Mouth: Mucous membranes are moist.  Eyes:     Pupils: Pupils are equal, round, and reactive to light.  Cardiovascular:     Rate and Rhythm: Normal rate and regular rhythm.     Pulses: Normal pulses.     Heart sounds: Normal heart sounds.  Pulmonary:     Effort: Pulmonary effort is normal.  Abdominal:     General: Abdomen is flat. Bowel sounds are normal.  Musculoskeletal:        General: Normal range of motion.     Cervical back: Normal range of motion.  Skin:    General: Skin is warm.  Neurological:     General: No focal deficit present.     Mental Status: He is alert.  Psychiatric:        Mood and Affect: Mood normal.       EKG:   Recent Labs: 05/19/2023: Magnesium 2.3 10/20/2023: ALT 22; B Natriuretic Peptide 818.5; BUN 22; Creatinine, Ser 0.96; Hemoglobin 15.6; Platelets 210; Potassium 3.3; Sodium 138    Lipid Panel No results found for: CHOL, TRIG, HDL, CHOLHDL, VLDL, LDLCALC, LDLDIRECT    Other studies Reviewed: Additional studies/ records that were reviewed today include:  Review of the above records demonstrates:      04/12/2018   11:13 AM  PAD Screen  Previous PAD dx? No  Previous surgical procedure? No  Pain with walking? No  Feet/toe relief with dangling? No  Painful, non-healing ulcers? No  Extremities discolored? No      ASSESSMENT AND PLAN:    ICD-10-CM   1. Acute HFrEF (heart failure with reduced  ejection fraction) (HCC)  I50.21    Breathing is better, on current medication, GDMT    2. Primary hypertension  I10     3. PAD (peripheral artery disease) (HCC)  I73.9     4. Gastroesophageal reflux disease with esophagitis, unspecified whether hemorrhage  K21.00        Problem List Items Addressed This Visit       Cardiovascular and Mediastinum   Acute HFrEF (heart failure with reduced ejection fraction) (HCC) - Primary   Hypertension   PAD (peripheral artery disease) (HCC)     Digestive   GERD (gastroesophageal reflux disease)       Disposition:   Return in about 6 weeks (around 12/15/2023).    Total time spent: 30 minutes  Signed,  Denyse Bathe, MD  11/03/2023 10:39 AM    [facility]

## 2023-11-13 ENCOUNTER — Other Ambulatory Visit: Payer: Self-pay | Admitting: Cardiovascular Disease

## 2023-11-20 ENCOUNTER — Ambulatory Visit: Payer: Medicare HMO | Admitting: Cardiovascular Disease

## 2023-12-13 ENCOUNTER — Other Ambulatory Visit: Payer: Self-pay | Admitting: Cardiovascular Disease

## 2023-12-14 ENCOUNTER — Other Ambulatory Visit: Payer: Self-pay

## 2023-12-14 ENCOUNTER — Encounter: Payer: Self-pay | Admitting: Emergency Medicine

## 2023-12-14 ENCOUNTER — Emergency Department
Admission: EM | Admit: 2023-12-14 | Discharge: 2023-12-14 | Discharge status: Against Medical Advice | Payer: Medicare HMO | Attending: Emergency Medicine | Admitting: Emergency Medicine

## 2023-12-14 DIAGNOSIS — R0602 Shortness of breath: Secondary | ICD-10-CM | POA: Diagnosis present

## 2023-12-14 DIAGNOSIS — R059 Cough, unspecified: Secondary | ICD-10-CM | POA: Diagnosis present

## 2023-12-14 DIAGNOSIS — J101 Influenza due to other identified influenza virus with other respiratory manifestations: Secondary | ICD-10-CM | POA: Diagnosis not present

## 2023-12-14 DIAGNOSIS — Z5321 Procedure and treatment not carried out due to patient leaving prior to being seen by health care provider: Secondary | ICD-10-CM | POA: Diagnosis not present

## 2023-12-14 NOTE — ED Triage Notes (Addendum)
Patient presents to ED pov complaining of Cough started  today,  started taking cough medication today but stated it is  causing diarrhea wants something different, patient refusing test for covid/flu/rsv

## 2023-12-14 NOTE — ED Triage Notes (Signed)
Patient rechecks back in after leaving the first time stating he believes he is having an allergic reaction to to delsym cough medicine from earlier today. States that his tongue started swelling, diarrhea.  Ambulatory to triage, not in any acute distress. AOX4. Reports waking up at 0530 am yesterday coughing continuously.

## 2023-12-14 NOTE — ED Notes (Signed)
Patient informed me that he was going to leave without being seen. I let patient know that it wasn't safe, that he seemed short of breath, and patient stated that he no longer wanted to wait. I informed patient to please return or call 911 if symptoms worsened or new symptoms arose. Patient verbalized understanding.

## 2023-12-14 NOTE — ED Notes (Addendum)
Patient came up to 1st nurse desk. Patient said that he was going to leave because he was getting hungry. Patient stated that he would try to come back again around 2 or 3am.

## 2023-12-15 ENCOUNTER — Emergency Department: Payer: Medicare HMO

## 2023-12-15 ENCOUNTER — Emergency Department
Admission: EM | Admit: 2023-12-15 | Discharge: 2023-12-15 | Discharge status: Against Medical Advice | Payer: Medicare HMO | Attending: Emergency Medicine | Admitting: Emergency Medicine

## 2023-12-15 ENCOUNTER — Emergency Department
Admission: EM | Admit: 2023-12-15 | Discharge: 2023-12-15 | Disposition: A | Discharge status: Home / Self Care | Payer: Medicare HMO | Source: Home / Self Care | Attending: Emergency Medicine | Admitting: Emergency Medicine

## 2023-12-15 DIAGNOSIS — R079 Chest pain, unspecified: Secondary | ICD-10-CM | POA: Insufficient documentation

## 2023-12-15 DIAGNOSIS — R0602 Shortness of breath: Secondary | ICD-10-CM | POA: Insufficient documentation

## 2023-12-15 DIAGNOSIS — Z5321 Procedure and treatment not carried out due to patient leaving prior to being seen by health care provider: Secondary | ICD-10-CM | POA: Insufficient documentation

## 2023-12-15 DIAGNOSIS — J101 Influenza due to other identified influenza virus with other respiratory manifestations: Secondary | ICD-10-CM | POA: Diagnosis not present

## 2023-12-15 LAB — COMPREHENSIVE METABOLIC PANEL
ALT: 18 U/L (ref 0–44)
AST: 45 U/L — ABNORMAL HIGH (ref 15–41)
Albumin: 3.8 g/dL (ref 3.5–5.0)
Alkaline Phosphatase: 50 U/L (ref 38–126)
Anion gap: 15 (ref 5–15)
BUN: 13 mg/dL (ref 8–23)
CO2: 18 mmol/L — ABNORMAL LOW (ref 22–32)
Calcium: 8.6 mg/dL — ABNORMAL LOW (ref 8.9–10.3)
Chloride: 101 mmol/L (ref 98–111)
Creatinine, Ser: 1.2 mg/dL (ref 0.61–1.24)
GFR, Estimated: >60 mL/min (ref >60)
Glucose, Bld: 136 mg/dL — ABNORMAL HIGH (ref 70–99)
Potassium: 3.7 mmol/L (ref 3.5–5.1)
Sodium: 134 mmol/L — ABNORMAL LOW (ref 135–145)
Total Bilirubin: 1.1 mg/dL (ref 0.0–1.2)
Total Protein: 7.3 g/dL (ref 6.5–8.1)

## 2023-12-15 LAB — CBC WITH DIFFERENTIAL/PLATELET
Abs Immature Granulocytes: 0.01 10*3/uL (ref 0.00–0.07)
Basophils Absolute: 0 10*3/uL (ref 0.0–0.1)
Basophils Relative: 1 %
Eosinophils Absolute: 0 10*3/uL (ref 0.0–0.5)
Eosinophils Relative: 0 %
HCT: 48 % (ref 39.0–52.0)
Hemoglobin: 16.3 g/dL (ref 13.0–17.0)
Immature Granulocytes: 0 %
Lymphocytes Relative: 24 %
Lymphs Abs: 1.2 10*3/uL (ref 0.7–4.0)
MCH: 30.1 pg (ref 26.0–34.0)
MCHC: 34 g/dL (ref 30.0–36.0)
MCV: 88.7 fL (ref 80.0–100.0)
Monocytes Absolute: 0.9 10*3/uL (ref 0.1–1.0)
Monocytes Relative: 18 %
Neutro Abs: 2.7 10*3/uL (ref 1.7–7.7)
Neutrophils Relative %: 57 %
Platelets: 194 10*3/uL (ref 150–400)
RBC: 5.41 MIL/uL (ref 4.22–5.81)
RDW: 13.6 % (ref 11.5–15.5)
WBC: 4.8 10*3/uL (ref 4.0–10.5)
nRBC: 0 % (ref 0.0–0.2)

## 2023-12-15 LAB — BRAIN NATRIURETIC PEPTIDE: B Natriuretic Peptide: 415.6 pg/mL — ABNORMAL HIGH (ref 0.0–100.0)

## 2023-12-15 LAB — TROPONIN I (HIGH SENSITIVITY): Troponin I (High Sensitivity): 57 ng/L — ABNORMAL HIGH (ref <18)

## 2023-12-15 LAB — RESP PANEL BY RT-PCR (RSV, FLU A&B, COVID)  RVPGX2
Influenza A by PCR: POSITIVE — AB
Influenza B by PCR: NEGATIVE
Resp Syncytial Virus by PCR: NEGATIVE
SARS Coronavirus 2 by RT PCR: NEGATIVE

## 2023-12-15 NOTE — ED Triage Notes (Signed)
Pt sts that he " lost his breath" for the last two days intermittently.

## 2023-12-15 NOTE — ED Provider Triage Note (Signed)
Emergency Medicine Provider Triage Evaluation Note  Ronald Horne , a 73 y.o. male  was evaluated in triage.  Pt complains of patient here for chest pain shortness of breath.  Seen here yesterday but left before treatment complete..  Review of Systems  Positive:  Negative:   Physical Exam  BP 114/78 (BP Location: Left Arm)   Pulse (!) 127   Temp 98.4 F (36.9 C) (Oral)   Resp (!) 22   Ht 6\' 2"  (1.88 m)   Wt (!) 142.9 kg   SpO2 94%   BMI 40.44 kg/m  Gen:   Awake, no distress   Resp:  Normal effort  MSK:   Moves extremities without difficulty  Other:    Medical Decision Making  Medically screening exam initiated at 4:54 PM.  Appropriate orders placed.  Hoa Deriso was informed that the remainder of the evaluation will be completed by another provider, this initial triage assessment does not replace that evaluation, and the importance of remaining in the ED until their evaluation is complete.  Cardiac workup.   Christen Bame, [place] 12/15/23 1654

## 2023-12-18 ENCOUNTER — Encounter: Payer: Self-pay | Admitting: Cardiovascular Disease

## 2023-12-18 ENCOUNTER — Telehealth: Payer: Self-pay | Admitting: Emergency Medicine

## 2023-12-18 ENCOUNTER — Ambulatory Visit (INDEPENDENT_AMBULATORY_CARE_PROVIDER_SITE_OTHER): Payer: Medicare HMO | Admitting: Cardiovascular Disease

## 2023-12-18 VITALS — BP 110/68 | HR 102 | Ht 74.0 in | Wt 303.0 lb

## 2023-12-18 DIAGNOSIS — I249 Acute ischemic heart disease, unspecified: Secondary | ICD-10-CM

## 2023-12-18 DIAGNOSIS — I5021 Acute systolic (congestive) heart failure: Secondary | ICD-10-CM

## 2023-12-18 DIAGNOSIS — I10 Essential (primary) hypertension: Secondary | ICD-10-CM

## 2023-12-18 DIAGNOSIS — I739 Peripheral vascular disease, unspecified: Secondary | ICD-10-CM

## 2023-12-18 DIAGNOSIS — R0789 Other chest pain: Secondary | ICD-10-CM

## 2023-12-18 DIAGNOSIS — R0602 Shortness of breath: Secondary | ICD-10-CM

## 2023-12-18 NOTE — Progress Notes (Signed)
Cardiology Office Note   Date:  12/18/2023   ID:  Ronald Horne, DOB 23-Jun-1951, MRN [id number]  PCP:  Sherrie Mustache, MD  Cardiologist:  Adrian Blackwater, MD      History of Present Illness: Ronald Horne is a 73 y.o. male who presents for No chief complaint on file.   Had SOB and chest tightnesss last Friday and  went to ER was told had COPD      Past Medical History:  Diagnosis Date   Arthritis    Cardiomyopathy (HCC)    a. 08/2016 Echo: EF 30-35%, diff HK; b. 03/2018 Ex MV (Jadali): Ex time 2:00, large anter defect, EF 24%; c. 03/2018 Echo: EF 20-25%, diff HK. Gr1 DD; d. Refused cath.   Chronic back pain    Chronic combined systolic (congestive) and diastolic (congestive) heart failure (HCC)    a. 08/2016 Echo: EF 30-35%, diff HK, mild to mod MR, mild BAE; b. 03/2018 Echo: EF 20-25%, diff HK. Gr1 DD. MIldly dil Ao root. Mild BAE. Mildly reduced RV fxn.   GERD (gastroesophageal reflux disease)    Gout    Hypertension    Obesity      Past Surgical History:  Procedure Laterality Date   EPIDURAL BLOCK INJECTION     chronic back pain    LEFT HEART CATH AND CORONARY ANGIOGRAPHY Left 12/27/2019   Procedure: LEFT HEART CATH AND CORONARY ANGIOGRAPHY;  Surgeon: Laurier Nancy, MD;  Location: [facility];  Service: Cardiovascular;  Laterality: Left;   TONSILLECTOMY       Current Outpatient Medications  Medication Sig Dispense Refill   acetaminophen (TYLENOL) 325 MG tablet Take 500 mg by mouth every 6 (six) hours as needed for moderate pain or mild pain.     albuterol (VENTOLIN HFA) 108 (90 Base) MCG/ACT inhaler Inhale into the lungs.     ascorbic acid (VITAMIN C) 500 MG tablet Take 500 mg by mouth daily.     aspirin 81 MG tablet Take 1 tablet (81 mg total) by mouth daily. (Patient not taking: Reported on 10/06/2022) 30 tablet 2   atorvastatin (LIPITOR) 10 MG tablet Take 1 tablet (10 mg total) by mouth daily. 30 tablet 2   CORLANOR 7.5 MG TABS tablet Take 7.5 mg by  mouth 2 (two) times daily.     digoxin (LANOXIN) 0.125 MG tablet Take 1 tablet (125 mcg total) by mouth daily. 30 tablet 11   ENTRESTO 97-103 MG TAKE 1 TABLET BY MOUTH TWICE A DAY 60 tablet 6   ergocalciferol (VITAMIN D2) 1.25 MG (50000 UT) capsule Take 50,000 Units by mouth once a week. (Patient not taking: Reported on 06/09/2023)     FARXIGA 10 MG TABS tablet TAKE 1 TABLET BY MOUTH DAILY 30 tablet 6   furosemide (LASIX) 40 MG tablet Take 1 tablet (40 mg total) by mouth daily. 90 tablet 3   gabapentin (NEURONTIN) 300 MG capsule Take 300 mg by mouth 2 (two) times daily.     methimazole (TAPAZOLE) 5 MG tablet Take 7.5 mg by mouth daily.     metoprolol (TOPROL XL) 200 MG 24 hr tablet Take 1 tablet (200 mg total) by mouth daily. 30 tablet 11   Multiple Vitamin (MULTIVITAMIN WITH MINERALS) TABS tablet Take 1 tablet by mouth daily.     pantoprazole (PROTONIX) 40 MG tablet Take 1 tablet (40 mg total) by mouth daily. 30 tablet 1   spironolactone (ALDACTONE) 25 MG tablet TAKE 1/2 TABLET BY MOUTH DAILY 15  tablet 6   No current facility-administered medications for this visit.    Allergies:   Patient has no known allergies.    Social History:   reports that he quit smoking about 47 years ago. His smoking use included cigarettes. He started smoking about 48 years ago. He has a 3 pack-year smoking history. He has never used smokeless tobacco. He reports that he does not drink alcohol and does not use drugs.   Family History:  family history is not on file.    ROS:     Review of Systems  Constitutional: Negative.   HENT: Negative.    Eyes: Negative.   Respiratory: Negative.    Gastrointestinal: Negative.   Genitourinary: Negative.   Musculoskeletal: Negative.   Skin: Negative.   Neurological: Negative.   Endo/Heme/Allergies: Negative.   Psychiatric/Behavioral: Negative.    All other systems reviewed and are negative.     All other systems are reviewed and negative.    PHYSICAL EXAM: VS:   BP 110/68   Pulse (!) 102   Ht 6\' 2"  (1.88 m)   Wt (!) 303 lb (137.4 kg)   SpO2 97%   BMI 38.90 kg/m  , BMI Body mass index is 38.9 kg/m. Last weight:  Wt Readings from Last 3 Encounters:  12/18/23 (!) 303 lb (137.4 kg)  12/15/23 (!) 315 lb (142.9 kg)  11/03/23 (!) 305 lb 4.8 oz (138.5 kg)     Physical Exam Vitals reviewed.  Constitutional:      Appearance: Normal appearance. He is normal weight.  HENT:     Head: Normocephalic.     Nose: Nose normal.     Mouth/Throat:     Mouth: Mucous membranes are moist.  Eyes:     Pupils: Pupils are equal, round, and reactive to light.  Cardiovascular:     Rate and Rhythm: Normal rate and regular rhythm.     Pulses: Normal pulses.     Heart sounds: Normal heart sounds.  Pulmonary:     Effort: Pulmonary effort is normal.  Abdominal:     General: Abdomen is flat. Bowel sounds are normal.  Musculoskeletal:        General: Normal range of motion.     Cervical back: Normal range of motion.  Skin:    General: Skin is warm.  Neurological:     General: No focal deficit present.     Mental Status: He is alert.  Psychiatric:        Mood and Affect: Mood normal.       EKG:   Recent Labs: 05/19/2023: Magnesium 2.3 12/15/2023: ALT 18; B Natriuretic Peptide 415.6; BUN 13; Creatinine, Ser 1.20; Hemoglobin 16.3; Platelets 194; Potassium 3.7; Sodium 134    Lipid Panel No results found for: "CHOL", "TRIG", "HDL", "CHOLHDL", "VLDL", "LDLCALC", "LDLDIRECT"    Other studies Reviewed: Additional studies/ records that were reviewed today include:  Review of the above records demonstrates:      04/12/2018   11:13 AM  PAD Screen  Previous PAD dx? No  Previous surgical procedure? No  Pain with walking? No  Feet/toe relief with dangling? No  Painful, non-healing ulcers? No  Extremities discolored? No      ASSESSMENT AND PLAN:    ICD-10-CM   1. Acute HFrEF (heart failure with reduced ejection fraction) (HCC)  I50.21 MYOCARDIAL  PERFUSION IMAGING    2. Primary hypertension  I10 MYOCARDIAL PERFUSION IMAGING    3. PAD (peripheral artery disease) (HCC)  I73.9 MYOCARDIAL PERFUSION  IMAGING    4. Acute coronary syndrome (HCC)  I24.9 MYOCARDIAL PERFUSION IMAGING    5. SOB (shortness of breath)  R06.02 MYOCARDIAL PERFUSION IMAGING   was in ER, with copd VS CHF    6. Other chest pain  R07.89 MYOCARDIAL PERFUSION IMAGING   Went to ER with chest pain and sob,advise stress test       Problem List Items Addressed This Visit       Cardiovascular and Mediastinum   Acute coronary syndrome (HCC)   Relevant Orders   MYOCARDIAL PERFUSION IMAGING   Acute HFrEF (heart failure with reduced ejection fraction) (HCC) - Primary   Relevant Orders   MYOCARDIAL PERFUSION IMAGING   Hypertension   Relevant Orders   MYOCARDIAL PERFUSION IMAGING   PAD (peripheral artery disease) (HCC)   Relevant Orders   MYOCARDIAL PERFUSION IMAGING   Other Visit Diagnoses       SOB (shortness of breath)       was in ER, with copd VS CHF   Relevant Orders   MYOCARDIAL PERFUSION IMAGING     Other chest pain       Went to ER with chest pain and sob,advise stress test   Relevant Orders   MYOCARDIAL PERFUSION IMAGING          Disposition:   Return in about 4 weeks (around 01/15/2024) for stress test and f/u.    Total time spent: 30 minutes  Signed,  Adrian Blackwater, MD  12/18/2023 9:50 AM    [facility]

## 2023-12-18 NOTE — Telephone Encounter (Signed)
 Called patient due to left emergency department before provider exam to inquire about condition and follow up plans. Left message.

## 2023-12-20 ENCOUNTER — Other Ambulatory Visit: Payer: Self-pay | Admitting: Cardiovascular Disease

## 2023-12-20 DIAGNOSIS — I5021 Acute systolic (congestive) heart failure: Secondary | ICD-10-CM

## 2023-12-20 DIAGNOSIS — I5043 Acute on chronic combined systolic (congestive) and diastolic (congestive) heart failure: Secondary | ICD-10-CM

## 2023-12-20 DIAGNOSIS — I10 Essential (primary) hypertension: Secondary | ICD-10-CM

## 2023-12-20 DIAGNOSIS — K2100 Gastro-esophageal reflux disease with esophagitis, without bleeding: Secondary | ICD-10-CM

## 2023-12-20 DIAGNOSIS — I739 Peripheral vascular disease, unspecified: Secondary | ICD-10-CM

## 2023-12-20 DIAGNOSIS — I249 Acute ischemic heart disease, unspecified: Secondary | ICD-10-CM

## 2024-01-08 ENCOUNTER — Ambulatory Visit (INDEPENDENT_AMBULATORY_CARE_PROVIDER_SITE_OTHER): Payer: Medicare HMO

## 2024-01-08 DIAGNOSIS — R0789 Other chest pain: Secondary | ICD-10-CM | POA: Diagnosis not present

## 2024-01-08 DIAGNOSIS — I5021 Acute systolic (congestive) heart failure: Secondary | ICD-10-CM | POA: Diagnosis not present

## 2024-01-08 DIAGNOSIS — I739 Peripheral vascular disease, unspecified: Secondary | ICD-10-CM

## 2024-01-08 DIAGNOSIS — R0602 Shortness of breath: Secondary | ICD-10-CM | POA: Diagnosis not present

## 2024-01-08 DIAGNOSIS — I10 Essential (primary) hypertension: Secondary | ICD-10-CM

## 2024-01-08 DIAGNOSIS — I249 Acute ischemic heart disease, unspecified: Secondary | ICD-10-CM

## 2024-01-08 MED ORDER — TECHNETIUM TC 99M SESTAMIBI GENERIC - CARDIOLITE
30.6000 | Freq: Once | INTRAVENOUS | Status: AC | PRN
  Administered 2024-01-08: 30.6 via INTRAVENOUS

## 2024-01-08 MED ORDER — TECHNETIUM TC 99M SESTAMIBI GENERIC - CARDIOLITE
10.4200 | Freq: Once | INTRAVENOUS | Status: AC | PRN
  Administered 2024-01-08: 10.42 via INTRAVENOUS

## 2024-01-12 ENCOUNTER — Encounter: Payer: Self-pay | Admitting: Cardiovascular Disease

## 2024-01-12 ENCOUNTER — Ambulatory Visit: Payer: Medicare HMO | Admitting: Cardiovascular Disease

## 2024-01-12 VITALS — BP 100/62 | HR 118 | Ht 74.0 in | Wt 304.0 lb

## 2024-01-12 DIAGNOSIS — I10 Essential (primary) hypertension: Secondary | ICD-10-CM | POA: Diagnosis not present

## 2024-01-12 DIAGNOSIS — I5021 Acute systolic (congestive) heart failure: Secondary | ICD-10-CM

## 2024-01-12 DIAGNOSIS — R000 Tachycardia, unspecified: Secondary | ICD-10-CM | POA: Diagnosis not present

## 2024-01-12 DIAGNOSIS — I519 Heart disease, unspecified: Secondary | ICD-10-CM

## 2024-01-12 DIAGNOSIS — I739 Peripheral vascular disease, unspecified: Secondary | ICD-10-CM | POA: Diagnosis not present

## 2024-01-12 MED ORDER — METOPROLOL SUCCINATE ER 100 MG PO TB24: 100.0000 mg | ORAL_TABLET | Freq: Every day | ORAL | 11 refills | Status: DC

## 2024-01-12 NOTE — Progress Notes (Signed)
 Cardiology Office Note   Date:  01/12/2024   ID:  Ronald Horne, DOB 1951/02/19, MRN [id number]  PCP:  Sherrie Mustache, MD  Cardiologist:  Adrian Blackwater, MD      History of Present Illness: Ronald Horne is a 73 y.o. male who presents for  Chief Complaint  Patient presents with   Follow-up    NST Results    Has right side of neck severe pain      Past Medical History:  Diagnosis Date   Arthritis    Cardiomyopathy (HCC)    a. 08/2016 Echo: EF 30-35%, diff HK; b. 03/2018 Ex MV (Jadali): Ex time 2:00, large anter defect, EF 24%; c. 03/2018 Echo: EF 20-25%, diff HK. Gr1 DD; d. Refused cath.   Chronic back pain    Chronic combined systolic (congestive) and diastolic (congestive) heart failure (HCC)    a. 08/2016 Echo: EF 30-35%, diff HK, mild to mod MR, mild BAE; b. 03/2018 Echo: EF 20-25%, diff HK. Gr1 DD. MIldly dil Ao root. Mild BAE. Mildly reduced RV fxn.   GERD (gastroesophageal reflux disease)    Gout    Hypertension    Obesity      Past Surgical History:  Procedure Laterality Date   EPIDURAL BLOCK INJECTION     chronic back pain    LEFT HEART CATH AND CORONARY ANGIOGRAPHY Left 12/27/2019   Procedure: LEFT HEART CATH AND CORONARY ANGIOGRAPHY;  Surgeon: Laurier Nancy, MD;  Location: [facility];  Service: Cardiovascular;  Laterality: Left;   TONSILLECTOMY       Current Outpatient Medications  Medication Sig Dispense Refill   metoprolol succinate (TOPROL XL) 100 MG 24 hr tablet Take 1 tablet (100 mg total) by mouth daily. Take with or immediately following a meal. 30 tablet 11   acetaminophen (TYLENOL) 325 MG tablet Take 500 mg by mouth every 6 (six) hours as needed for moderate pain or mild pain.     albuterol (VENTOLIN HFA) 108 (90 Base) MCG/ACT inhaler Inhale into the lungs.     ascorbic acid (VITAMIN C) 500 MG tablet Take 500 mg by mouth daily.     aspirin 81 MG tablet Take 1 tablet (81 mg total) by mouth daily. (Patient not taking: Reported on  10/06/2022) 30 tablet 2   atorvastatin (LIPITOR) 10 MG tablet Take 1 tablet (10 mg total) by mouth daily. 30 tablet 2   digoxin (LANOXIN) 0.125 MG tablet Take 1 tablet (125 mcg total) by mouth daily. 30 tablet 11   ENTRESTO 97-103 MG TAKE 1 TABLET BY MOUTH TWICE A DAY 60 tablet 6   ergocalciferol (VITAMIN D2) 1.25 MG (50000 UT) capsule Take 50,000 Units by mouth once a week. (Patient not taking: Reported on 06/09/2023)     FARXIGA 10 MG TABS tablet TAKE 1 TABLET BY MOUTH DAILY 30 tablet 6   furosemide (LASIX) 40 MG tablet Take 1 tablet (40 mg total) by mouth daily. 90 tablet 3   gabapentin (NEURONTIN) 300 MG capsule Take 300 mg by mouth 2 (two) times daily.     methimazole (TAPAZOLE) 5 MG tablet Take 7.5 mg by mouth daily.     metoprolol (TOPROL XL) 200 MG 24 hr tablet Take 1 tablet (200 mg total) by mouth daily. 30 tablet 11   Multiple Vitamin (MULTIVITAMIN WITH MINERALS) TABS tablet Take 1 tablet by mouth daily.     pantoprazole (PROTONIX) 40 MG tablet TAKE 1 TABLET (40 MG TOTAL) BY MOUTH DAILY. 30 tablet 1  spironolactone (ALDACTONE) 25 MG tablet TAKE 1/2 TABLET BY MOUTH DAILY 15 tablet 6   No current facility-administered medications for this visit.    Allergies:   Patient has no known allergies.    Social History:   reports that he quit smoking about 47 years ago. His smoking use included cigarettes. He started smoking about 48 years ago. He has a 3 pack-year smoking history. He has never used smokeless tobacco. He reports that he does not drink alcohol and does not use drugs.   Family History:  family history is not on file.    ROS:     Review of Systems  Constitutional: Negative.   HENT: Negative.    Eyes: Negative.   Respiratory: Negative.    Gastrointestinal: Negative.   Genitourinary: Negative.   Musculoskeletal: Negative.   Skin: Negative.   Neurological: Negative.   Endo/Heme/Allergies: Negative.   Psychiatric/Behavioral: Negative.    All other systems reviewed and are  negative.     All other systems are reviewed and negative.    PHYSICAL EXAM: VS:  BP 100/62   Pulse (!) 118   Ht 6\' 2"  (1.88 m)   Wt (!) 304 lb (137.9 kg)   SpO2 98%   BMI 39.03 kg/m  , BMI Body mass index is 39.03 kg/m. Last weight:  Wt Readings from Last 3 Encounters:  01/12/24 (!) 304 lb (137.9 kg)  12/18/23 (!) 303 lb (137.4 kg)  12/15/23 (!) 315 lb (142.9 kg)     Physical Exam Vitals reviewed.  Constitutional:      Appearance: Normal appearance. He is normal weight.  HENT:     Head: Normocephalic.     Nose: Nose normal.     Mouth/Throat:     Mouth: Mucous membranes are moist.  Eyes:     Pupils: Pupils are equal, round, and reactive to light.  Cardiovascular:     Rate and Rhythm: Normal rate and regular rhythm.     Pulses: Normal pulses.     Heart sounds: Normal heart sounds.  Pulmonary:     Effort: Pulmonary effort is normal.  Abdominal:     General: Abdomen is flat. Bowel sounds are normal.  Musculoskeletal:        General: Normal range of motion.     Cervical back: Normal range of motion.  Skin:    General: Skin is warm.  Neurological:     General: No focal deficit present.     Mental Status: He is alert.  Psychiatric:        Mood and Affect: Mood normal.       EKG:   Recent Labs: 05/19/2023: Magnesium 2.3 12/15/2023: ALT 18; B Natriuretic Peptide 415.6; BUN 13; Creatinine, Ser 1.20; Hemoglobin 16.3; Platelets 194; Potassium 3.7; Sodium 134    Lipid Panel No results found for: "CHOL", "TRIG", "HDL", "CHOLHDL", "VLDL", "LDLCALC", "LDLDIRECT"    Other studies Reviewed: Additional studies/ records that were reviewed today include:  Review of the above records demonstrates:      04/12/2018   11:13 AM  PAD Screen  Previous PAD dx? No  Previous surgical procedure? No  Pain with walking? No  Feet/toe relief with dangling? No  Painful, non-healing ulcers? No  Extremities discolored? No      ASSESSMENT AND PLAN:    ICD-10-CM   1.  Acute HFrEF (heart failure with reduced ejection fraction) (HCC)  I50.21 metoprolol succinate (TOPROL XL) 100 MG 24 hr tablet   stress test had fixed defect no  ischaemia, probably due to cardiomyopathy. Treat medically    2. Primary hypertension  I10 metoprolol succinate (TOPROL XL) 100 MG 24 hr tablet    3. PAD (peripheral artery disease) (HCC)  I73.9 metoprolol succinate (TOPROL XL) 100 MG 24 hr tablet    4. Sinus tachycardia  R00.0 metoprolol succinate (TOPROL XL) 100 MG 24 hr tablet   Unable to get coloanor, thus increase metoprolol to 300 daily    5. Cardiopathy  I51.9 metoprolol succinate (TOPROL XL) 100 MG 24 hr tablet       Problem List Items Addressed This Visit       Cardiovascular and Mediastinum   Acute HFrEF (heart failure with reduced ejection fraction) (HCC) - Primary   Relevant Medications   metoprolol succinate (TOPROL XL) 100 MG 24 hr tablet   Hypertension   Relevant Medications   metoprolol succinate (TOPROL XL) 100 MG 24 hr tablet   PAD (peripheral artery disease) (HCC)   Relevant Medications   metoprolol succinate (TOPROL XL) 100 MG 24 hr tablet   Other Visit Diagnoses       Sinus tachycardia       Unable to get coloanor, thus increase metoprolol to 300 daily   Relevant Medications   metoprolol succinate (TOPROL XL) 100 MG 24 hr tablet     Cardiopathy       Relevant Medications   metoprolol succinate (TOPROL XL) 100 MG 24 hr tablet          Disposition:   Return in about 4 weeks (around 02/09/2024).    Total time spent: 30 minutes  Signed,  Adrian Blackwater, MD  01/12/2024 11:14 AM    [facility]

## 2024-01-31 ENCOUNTER — Ambulatory Visit
Admission: RE | Admit: 2024-01-31 | Discharge: 2024-01-31 | Disposition: A | Discharge status: Home / Self Care | Attending: Nurse Practitioner | Admitting: Nurse Practitioner

## 2024-01-31 ENCOUNTER — Other Ambulatory Visit: Payer: Self-pay | Admitting: Nurse Practitioner

## 2024-01-31 ENCOUNTER — Telehealth: Payer: Self-pay

## 2024-01-31 ENCOUNTER — Ambulatory Visit
Admission: RE | Admit: 2024-01-31 | Discharge: 2024-01-31 | Disposition: A | Discharge status: Home / Self Care | Source: Ambulatory Visit | Attending: Nurse Practitioner | Admitting: Nurse Practitioner

## 2024-01-31 DIAGNOSIS — M25511 Pain in right shoulder: Secondary | ICD-10-CM | POA: Diagnosis present

## 2024-01-31 DIAGNOSIS — R0602 Shortness of breath: Secondary | ICD-10-CM | POA: Insufficient documentation

## 2024-01-31 NOTE — Telephone Encounter (Signed)
 The patient called in asking if we had any cancellations. I informed him that we did not have any cancellations at this time, as he had already been waiting for a month. He asked if we still had him on the schedule, and I informed him that we do not have an appointment for him. I asked if he meant to call [facility], and he confirmed that this was the correct number to call. I reiterated that we did not have an appointment for him. He responded that it was fine and that he would just come up to the office.

## 2024-02-08 DIAGNOSIS — M25511 Pain in right shoulder: Secondary | ICD-10-CM | POA: Insufficient documentation

## 2024-02-08 DIAGNOSIS — I50812 Chronic right heart failure: Secondary | ICD-10-CM | POA: Insufficient documentation

## 2024-02-08 DIAGNOSIS — K5909 Other constipation: Secondary | ICD-10-CM | POA: Insufficient documentation

## 2024-02-08 DIAGNOSIS — I517 Cardiomegaly: Secondary | ICD-10-CM | POA: Insufficient documentation

## 2024-02-08 DIAGNOSIS — I4891 Unspecified atrial fibrillation: Secondary | ICD-10-CM | POA: Insufficient documentation

## 2024-02-09 ENCOUNTER — Ambulatory Visit: Payer: Self-pay | Admitting: Cardiovascular Disease

## 2024-02-09 ENCOUNTER — Encounter: Payer: Self-pay | Admitting: Cardiovascular Disease

## 2024-02-09 ENCOUNTER — Ambulatory Visit: Admitting: Cardiovascular Disease

## 2024-02-09 VITALS — BP 110/75 | HR 118 | Ht 74.0 in | Wt 309.0 lb

## 2024-02-09 DIAGNOSIS — G4733 Obstructive sleep apnea (adult) (pediatric): Secondary | ICD-10-CM

## 2024-02-09 DIAGNOSIS — R0602 Shortness of breath: Secondary | ICD-10-CM

## 2024-02-09 DIAGNOSIS — Z0130 Encounter for examination of blood pressure without abnormal findings: Secondary | ICD-10-CM

## 2024-02-09 DIAGNOSIS — R0789 Other chest pain: Secondary | ICD-10-CM | POA: Diagnosis not present

## 2024-02-09 DIAGNOSIS — I5021 Acute systolic (congestive) heart failure: Secondary | ICD-10-CM

## 2024-02-09 DIAGNOSIS — R000 Tachycardia, unspecified: Secondary | ICD-10-CM

## 2024-02-09 DIAGNOSIS — I200 Unstable angina: Secondary | ICD-10-CM | POA: Insufficient documentation

## 2024-02-09 DIAGNOSIS — I480 Paroxysmal atrial fibrillation: Secondary | ICD-10-CM

## 2024-02-09 DIAGNOSIS — I519 Heart disease, unspecified: Secondary | ICD-10-CM | POA: Insufficient documentation

## 2024-02-09 LAB — CBC WITH DIFFERENTIAL/PLATELET
Basophils Absolute: 0 10*3/uL (ref 0.0–0.2)
Basos: 1 %
EOS (ABSOLUTE): 0 10*3/uL (ref 0.0–0.4)
Eos: 0 %
Hematocrit: 46 % (ref 37.5–51.0)
Hemoglobin: 15.7 g/dL (ref 13.0–17.7)
Immature Grans (Abs): 0 10*3/uL (ref 0.0–0.1)
Immature Granulocytes: 0 %
Lymphocytes Absolute: 1.4 10*3/uL (ref 0.7–3.1)
Lymphs: 19 %
MCH: 30.8 pg (ref 26.6–33.0)
MCHC: 34.1 g/dL (ref 31.5–35.7)
MCV: 90 fL (ref 79–97)
Monocytes Absolute: 0.6 10*3/uL (ref 0.1–0.9)
Monocytes: 8 %
Neutrophils Absolute: 5.4 10*3/uL (ref 1.4–7.0)
Neutrophils: 72 %
Platelets: 235 10*3/uL (ref 150–450)
RBC: 5.09 x10E6/uL (ref 4.14–5.80)
RDW: 14 % (ref 11.6–15.4)
WBC: 7.5 10*3/uL (ref 3.4–10.8)

## 2024-02-09 MED ORDER — APIXABAN 5 MG PO TABS: 5.0000 mg | ORAL_TABLET | Freq: Two times a day (BID) | ORAL | 2 refills | Status: DC

## 2024-02-09 MED ORDER — AMIODARONE HCL 400 MG PO TABS: 400.0000 mg | ORAL_TABLET | Freq: Every day | ORAL | 0 refills | Status: DC

## 2024-02-09 MED ORDER — TORSEMIDE 10 MG PO TABS: 10.0000 mg | ORAL_TABLET | Freq: Two times a day (BID) | ORAL | 11 refills | Status: DC

## 2024-02-09 NOTE — Progress Notes (Signed)
 Cardiology Office Note   Date:  02/09/2024   ID:  Ronald Horne, DOB 12-13-1950, MRN [id number]  PCP:  Sherrie Mustache, MD  Cardiologist:  Adrian Blackwater, MD      History of Present Illness: Ronald Horne is a 73 y.o. male who presents for No chief complaint on file.   Went to [facility] walk in clinic, as was SOB, tachycardic and PND/orthopnea.     Past Medical History:  Diagnosis Date  . Arthritis   . Cardiomyopathy (HCC)    a. 08/2016 Echo: EF 30-35%, diff HK; b. 03/2018 Ex MV (Jadali): Ex time 2:00, large anter defect, EF 24%; c. 03/2018 Echo: EF 20-25%, diff HK. Gr1 DD; d. Refused cath.  . Chronic back pain   . Chronic combined systolic (congestive) and diastolic (congestive) heart failure (HCC)    a. 08/2016 Echo: EF 30-35%, diff HK, mild to mod MR, mild BAE; b. 03/2018 Echo: EF 20-25%, diff HK. Gr1 DD. MIldly dil Ao root. Mild BAE. Mildly reduced RV fxn.  Marland Kitchen GERD (gastroesophageal reflux disease)   . Gout   . Hypertension   . Obesity      Past Surgical History:  Procedure Laterality Date  . EPIDURAL BLOCK INJECTION     chronic back pain   . LEFT HEART CATH AND CORONARY ANGIOGRAPHY Left 12/27/2019   Procedure: LEFT HEART CATH AND CORONARY ANGIOGRAPHY;  Surgeon: Laurier Nancy, MD;  Location: [facility] INVASIVE CV LAB;  Service: Cardiovascular;  Laterality: Left;  . TONSILLECTOMY       Current Outpatient Medications  Medication Sig Dispense Refill  . amiodarone (PACERONE) 400 MG tablet Take 1 tablet (400 mg total) by mouth daily. 60 tablet 0  . apixaban (ELIQUIS) 5 MG TABS tablet Take 1 tablet (5 mg total) by mouth 2 (two) times daily. 60 tablet 2  . torsemide (DEMADEX) 10 MG tablet Take 1 tablet (10 mg total) by mouth 2 (two) times daily. 60 tablet 11  . acetaminophen (TYLENOL) 325 MG tablet Take 500 mg by mouth every 6 (six) hours as needed for moderate pain or mild pain.    Marland Kitchen albuterol (VENTOLIN HFA) 108 (90 Base) MCG/ACT inhaler Inhale into the lungs.    Marland Kitchen ascorbic acid  (VITAMIN C) 500 MG tablet Take 500 mg by mouth daily.    Marland Kitchen atorvastatin (LIPITOR) 10 MG tablet Take 1 tablet (10 mg total) by mouth daily. 30 tablet 2  . digoxin (LANOXIN) 0.125 MG tablet Take 1 tablet (125 mcg total) by mouth daily. 30 tablet 11  . ENTRESTO 97-103 MG TAKE 1 TABLET BY MOUTH TWICE A DAY 60 tablet 6  . ergocalciferol (VITAMIN D2) 1.25 MG (50000 UT) capsule Take 50,000 Units by mouth once a week. (Patient not taking: Reported on 06/09/2023)    . FARXIGA 10 MG TABS tablet TAKE 1 TABLET BY MOUTH DAILY 30 tablet 6  . gabapentin (NEURONTIN) 300 MG capsule Take 300 mg by mouth 2 (two) times daily.    . methimazole (TAPAZOLE) 5 MG tablet Take 7.5 mg by mouth daily.    . metoprolol (TOPROL XL) 200 MG 24 hr tablet Take 1 tablet (200 mg total) by mouth daily. 30 tablet 11  . metoprolol succinate (TOPROL XL) 100 MG 24 hr tablet Take 1 tablet (100 mg total) by mouth daily. Take with or immediately following a meal. 30 tablet 11  . Multiple Vitamin (MULTIVITAMIN WITH MINERALS) TABS tablet Take 1 tablet by mouth daily.    . pantoprazole (PROTONIX) 40  MG tablet TAKE 1 TABLET (40 MG TOTAL) BY MOUTH DAILY. 30 tablet 1  . spironolactone (ALDACTONE) 25 MG tablet TAKE 1/2 TABLET BY MOUTH DAILY 15 tablet 6   No current facility-administered medications for this visit.    Allergies:   Patient has no known allergies.    Social History:   reports that he quit smoking about 47 years ago. His smoking use included cigarettes. He started smoking about 48 years ago. He has a 3 pack-year smoking history. He has never used smokeless tobacco. He reports that he does not drink alcohol and does not use drugs.   Family History:  family history is not on file.    ROS:     Review of Systems  Constitutional: Negative.   HENT: Negative.    Eyes: Negative.   Respiratory: Negative.    Gastrointestinal: Negative.   Genitourinary: Negative.   Musculoskeletal: Negative.   Skin: Negative.   Neurological:  Negative.   Endo/Heme/Allergies: Negative.   Psychiatric/Behavioral: Negative.    All other systems reviewed and are negative.     All other systems are reviewed and negative.    PHYSICAL EXAM: VS:  BP 110/75   Pulse (!) 118   Ht 6\' 2"  (1.88 m)   Wt (!) 309 lb (140.2 kg)   SpO2 98%   BMI 39.67 kg/m  , BMI Body mass index is 39.67 kg/m. Last weight:  Wt Readings from Last 3 Encounters:  02/09/24 (!) 309 lb (140.2 kg)  01/12/24 (!) 304 lb (137.9 kg)  12/18/23 (!) 303 lb (137.4 kg)     Physical Exam Vitals reviewed.  Constitutional:      Appearance: Normal appearance. He is normal weight.  HENT:     Head: Normocephalic.     Nose: Nose normal.     Mouth/Throat:     Mouth: Mucous membranes are moist.  Eyes:     Pupils: Pupils are equal, round, and reactive to light.  Cardiovascular:     Rate and Rhythm: Normal rate and regular rhythm.     Pulses: Normal pulses.     Heart sounds: Normal heart sounds.  Pulmonary:     Effort: Pulmonary effort is normal.  Abdominal:     General: Abdomen is flat. Bowel sounds are normal.  Musculoskeletal:        General: Normal range of motion.     Cervical back: Normal range of motion.  Skin:    General: Skin is warm.  Neurological:     General: No focal deficit present.     Mental Status: He is alert.  Psychiatric:        Mood and Affect: Mood normal.      EKG: Afib with RVR 112/minLBBB  Recent Labs: 05/19/2023: Magnesium 2.3 12/15/2023: ALT 18; B Natriuretic Peptide 415.6; BUN 13; Creatinine, Ser 1.20; Hemoglobin 16.3; Platelets 194; Potassium 3.7; Sodium 134    Lipid Panel No results found for: "CHOL", "TRIG", "HDL", "CHOLHDL", "VLDL", "LDLCALC", "LDLDIRECT"    Other studies Reviewed: Additional studies/ records that were reviewed today include:  Review of the above records demonstrates:      04/12/2018   11:13 AM  PAD Screen  Previous PAD dx? No  Previous surgical procedure? No  Pain with walking? No  Feet/toe  relief with dangling? No  Painful, non-healing ulcers? No  Extremities discolored? No      ASSESSMENT AND PLAN:    ICD-10-CM   1. Other chest pain  R07.89 Comprehensive metabolic panel  CBC with Differential/Platelet    Procedural/ Surgical Case Request: RIGHT/LEFT HEART CATH AND CORONARY ANGIOGRAPHY with possible coronary intervention    torsemide (DEMADEX) 10 MG tablet    amiodarone (PACERONE) 400 MG tablet    apixaban (ELIQUIS) 5 MG TABS tablet   stress test showed LVEF18%, infraction in LAD, and RCa territory , advise Cath, right and left.    2. Cardiopathy  I51.9 Comprehensive metabolic panel    CBC with Differential/Platelet    Procedural/ Surgical Case Request: RIGHT/LEFT HEART CATH AND CORONARY ANGIOGRAPHY with possible coronary intervention    torsemide (DEMADEX) 10 MG tablet    amiodarone (PACERONE) 400 MG tablet    apixaban (ELIQUIS) 5 MG TABS tablet    3. Acute HFrEF (heart failure with reduced ejection fraction) (HCC)  I50.21 Comprehensive metabolic panel    CBC with Differential/Platelet    Procedural/ Surgical Case Request: RIGHT/LEFT HEART CATH AND CORONARY ANGIOGRAPHY with possible coronary intervention    torsemide (DEMADEX) 10 MG tablet    amiodarone (PACERONE) 400 MG tablet    apixaban (ELIQUIS) 5 MG TABS tablet    4. SOB (shortness of breath)  R06.02 Comprehensive metabolic panel    CBC with Differential/Platelet    Procedural/ Surgical Case Request: RIGHT/LEFT HEART CATH AND CORONARY ANGIOGRAPHY with possible coronary intervention    torsemide (DEMADEX) 10 MG tablet    amiodarone (PACERONE) 400 MG tablet    apixaban (ELIQUIS) 5 MG TABS tablet    5. Obstructive sleep apnea syndrome  G47.33 Comprehensive metabolic panel    CBC with Differential/Platelet    Procedural/ Surgical Case Request: RIGHT/LEFT HEART CATH AND CORONARY ANGIOGRAPHY with possible coronary intervention    torsemide (DEMADEX) 10 MG tablet    amiodarone (PACERONE) 400 MG tablet     apixaban (ELIQUIS) 5 MG TABS tablet    6. Sinus tachycardia  R00.0 Comprehensive metabolic panel    CBC with Differential/Platelet    Procedural/ Surgical Case Request: RIGHT/LEFT HEART CATH AND CORONARY ANGIOGRAPHY with possible coronary intervention    torsemide (DEMADEX) 10 MG tablet    amiodarone (PACERONE) 400 MG tablet    apixaban (ELIQUIS) 5 MG TABS tablet    7. Unstable angina (HCC)  I20.0 Comprehensive metabolic panel    CBC with Differential/Platelet    Procedural/ Surgical Case Request: RIGHT/LEFT HEART CATH AND CORONARY ANGIOGRAPHY with possible coronary intervention    torsemide (DEMADEX) 10 MG tablet    amiodarone (PACERONE) 400 MG tablet    apixaban (ELIQUIS) 5 MG TABS tablet    8. Other chest pain  R07.89 Comprehensive metabolic panel    CBC with Differential/Platelet    Procedural/ Surgical Case Request: RIGHT/LEFT HEART CATH AND CORONARY ANGIOGRAPHY with possible coronary intervention    torsemide (DEMADEX) 10 MG tablet    amiodarone (PACERONE) 400 MG tablet    apixaban (ELIQUIS) 5 MG TABS tablet    9. Paroxysmal atrial fibrillation (HCC)  I48.0 apixaban (ELIQUIS) 5 MG TABS tablet   new onset afib 112/min, start amiodrone, stop asp, start elliquis       Problem List Items Addressed This Visit       Cardiovascular and Mediastinum   Acute HFrEF (heart failure with reduced ejection fraction) (HCC)   Relevant Medications   torsemide (DEMADEX) 10 MG tablet   amiodarone (PACERONE) 400 MG tablet   apixaban (ELIQUIS) 5 MG TABS tablet   Other Relevant Orders   Comprehensive metabolic panel   CBC with Differential/Platelet   Procedural/ Surgical Case Request: RIGHT/LEFT HEART CATH AND  CORONARY ANGIOGRAPHY with possible coronary intervention   Cardiopathy   Relevant Medications   torsemide (DEMADEX) 10 MG tablet   amiodarone (PACERONE) 400 MG tablet   apixaban (ELIQUIS) 5 MG TABS tablet   Other Relevant Orders   Comprehensive metabolic panel   CBC with  Differential/Platelet   Procedural/ Surgical Case Request: RIGHT/LEFT HEART CATH AND CORONARY ANGIOGRAPHY with possible coronary intervention   Unstable angina (HCC)   Relevant Medications   torsemide (DEMADEX) 10 MG tablet   amiodarone (PACERONE) 400 MG tablet   apixaban (ELIQUIS) 5 MG TABS tablet   Other Relevant Orders   Comprehensive metabolic panel   CBC with Differential/Platelet   Procedural/ Surgical Case Request: RIGHT/LEFT HEART CATH AND CORONARY ANGIOGRAPHY with possible coronary intervention     Respiratory   Obstructive sleep apnea syndrome   Relevant Medications   torsemide (DEMADEX) 10 MG tablet   amiodarone (PACERONE) 400 MG tablet   apixaban (ELIQUIS) 5 MG TABS tablet   Other Relevant Orders   Comprehensive metabolic panel   CBC with Differential/Platelet   Procedural/ Surgical Case Request: RIGHT/LEFT HEART CATH AND CORONARY ANGIOGRAPHY with possible coronary intervention     Other   SOB (shortness of breath)   Relevant Medications   torsemide (DEMADEX) 10 MG tablet   amiodarone (PACERONE) 400 MG tablet   apixaban (ELIQUIS) 5 MG TABS tablet   Other Relevant Orders   Comprehensive metabolic panel   CBC with Differential/Platelet   Procedural/ Surgical Case Request: RIGHT/LEFT HEART CATH AND CORONARY ANGIOGRAPHY with possible coronary intervention   Sinus tachycardia   Relevant Medications   torsemide (DEMADEX) 10 MG tablet   amiodarone (PACERONE) 400 MG tablet   apixaban (ELIQUIS) 5 MG TABS tablet   Other Relevant Orders   Comprehensive metabolic panel   CBC with Differential/Platelet   Procedural/ Surgical Case Request: RIGHT/LEFT HEART CATH AND CORONARY ANGIOGRAPHY with possible coronary intervention   Other chest pain - Primary   Relevant Medications   torsemide (DEMADEX) 10 MG tablet   amiodarone (PACERONE) 400 MG tablet   apixaban (ELIQUIS) 5 MG TABS tablet   Other Relevant Orders   Comprehensive metabolic panel   CBC with Differential/Platelet    Procedural/ Surgical Case Request: RIGHT/LEFT HEART CATH AND CORONARY ANGIOGRAPHY with possible coronary intervention   Other Visit Diagnoses       Paroxysmal atrial fibrillation (HCC)       new onset afib 112/min, start amiodrone, stop asp, start elliquis   Relevant Medications   torsemide (DEMADEX) 10 MG tablet   amiodarone (PACERONE) 400 MG tablet   apixaban (ELIQUIS) 5 MG TABS tablet          Disposition:   Return in about 4 weeks (around 03/08/2024) for set up right and left heart cath and f/u Try to set up first case 7:30.    Total time spent: 50 minutes  Signed,  Adrian Blackwater, MD  02/09/2024 10:25 AM    [facility]

## 2024-02-10 LAB — COMPREHENSIVE METABOLIC PANEL WITH GFR
ALT: 12 IU/L (ref 0–44)
AST: 16 IU/L (ref 0–40)
Albumin: 3.8 g/dL — ABNORMAL LOW (ref 3.9–4.9)
Alkaline Phosphatase: 59 IU/L (ref 44–121)
BUN/Creatinine Ratio: 13 (ref 10–24)
BUN: 14 mg/dL (ref 8–27)
Bilirubin Total: 0.3 mg/dL (ref 0.0–1.2)
CO2: 21 mmol/L (ref 20–29)
Calcium: 9 mg/dL (ref 8.6–10.2)
Chloride: 103 mmol/L (ref 96–106)
Creatinine, Ser: 1.1 mg/dL (ref 0.76–1.27)
Globulin, Total: 2.7 g/dL (ref 1.5–4.5)
Glucose: 109 mg/dL — ABNORMAL HIGH (ref 70–99)
Potassium: 4.3 mmol/L (ref 3.5–5.2)
Sodium: 139 mmol/L (ref 134–144)
Total Protein: 6.5 g/dL (ref 6.0–8.5)
eGFR: 72 mL/min/{1.73_m2} (ref >59)

## 2024-02-12 ENCOUNTER — Encounter: Payer: Self-pay | Admitting: Cardiovascular Disease

## 2024-02-16 ENCOUNTER — Ambulatory Visit: Admitting: Cardiovascular Disease

## 2024-02-19 ENCOUNTER — Encounter: Payer: Self-pay | Admitting: Cardiovascular Disease

## 2024-02-19 ENCOUNTER — Ambulatory Visit: Admitting: Cardiovascular Disease

## 2024-02-19 ENCOUNTER — Encounter
Admission: RE | Discharge status: Against Medical Advice | Payer: Self-pay | Source: Home / Self Care | Attending: Cardiovascular Disease

## 2024-02-19 ENCOUNTER — Ambulatory Visit
Admission: RE | Admit: 2024-02-19 | Discharge: 2024-02-19 | Discharge status: Against Medical Advice | Attending: Cardiovascular Disease | Admitting: Cardiovascular Disease

## 2024-02-19 DIAGNOSIS — G4733 Obstructive sleep apnea (adult) (pediatric): Secondary | ICD-10-CM | POA: Insufficient documentation

## 2024-02-19 DIAGNOSIS — R000 Tachycardia, unspecified: Secondary | ICD-10-CM | POA: Insufficient documentation

## 2024-02-19 DIAGNOSIS — R0789 Other chest pain: Secondary | ICD-10-CM | POA: Insufficient documentation

## 2024-02-19 DIAGNOSIS — I200 Unstable angina: Secondary | ICD-10-CM | POA: Insufficient documentation

## 2024-02-19 DIAGNOSIS — I5021 Acute systolic (congestive) heart failure: Secondary | ICD-10-CM | POA: Insufficient documentation

## 2024-02-19 DIAGNOSIS — I519 Heart disease, unspecified: Secondary | ICD-10-CM | POA: Insufficient documentation

## 2024-02-19 DIAGNOSIS — R0602 Shortness of breath: Secondary | ICD-10-CM | POA: Insufficient documentation

## 2024-02-19 SURGERY — RIGHT/LEFT HEART CATH AND CORONARY ANGIOGRAPHY
Anesthesia: Moderate Sedation | Laterality: Bilateral

## 2024-02-19 MED ORDER — SODIUM CHLORIDE 0.9 % WEIGHT BASED INFUSION: 1.0000 mL/kg/h | INTRAVENOUS | Status: DC

## 2024-02-19 MED ORDER — SODIUM CHLORIDE 0.9% FLUSH: 3.0000 mL | Freq: Two times a day (BID) | INTRAVENOUS | Status: DC

## 2024-02-19 MED ORDER — SODIUM CHLORIDE 0.9 % WEIGHT BASED INFUSION: 3.0000 mL/kg/h | INTRAVENOUS | Status: DC

## 2024-02-19 MED ORDER — SODIUM CHLORIDE 0.9 % IV SOLN: 250.0000 mL | INTRAVENOUS | Status: DC | PRN

## 2024-02-19 MED ORDER — SODIUM CHLORIDE 0.9% FLUSH
3.0000 mL | INTRAVENOUS | Status: DC | PRN
Start: 2024-02-19 — End: 2024-02-19

## 2024-02-19 MED ORDER — ASPIRIN 81 MG PO CHEW
CHEWABLE_TABLET | ORAL | Status: AC
  Filled 2024-02-19: qty 1

## 2024-02-19 MED ORDER — ASPIRIN 81 MG PO CHEW
81.0000 mg | CHEWABLE_TABLET | ORAL | Status: AC
  Administered 2024-02-19: 81 mg via ORAL

## 2024-02-19 NOTE — Progress Notes (Signed)
 Patient called this RN into room. States he is uncomfortable and has been waiting over 3 hours for Dr. Meredeth Stallion. I asked him if we could do anything to help and he states no and wants to go home. States he is not sick and does not want procedure today. Explained to patient that this is a hospital area and we must take emergent patients as they come. Patient states he does not care and wants to leave. IV's removed and patient given belongings. Instructed patient to follow-up with Dr. Meredeth Stallion to reschedule.

## 2024-02-22 ENCOUNTER — Ambulatory Visit (INDEPENDENT_AMBULATORY_CARE_PROVIDER_SITE_OTHER): Admitting: Cardiovascular Disease

## 2024-02-22 ENCOUNTER — Encounter: Payer: Self-pay | Admitting: Cardiovascular Disease

## 2024-02-22 VITALS — BP 90/52 | HR 97 | Ht 74.0 in | Wt 305.0 lb

## 2024-02-22 DIAGNOSIS — R0789 Other chest pain: Secondary | ICD-10-CM

## 2024-02-22 DIAGNOSIS — R0602 Shortness of breath: Secondary | ICD-10-CM | POA: Diagnosis not present

## 2024-02-22 DIAGNOSIS — I200 Unstable angina: Secondary | ICD-10-CM

## 2024-02-22 DIAGNOSIS — I10 Essential (primary) hypertension: Secondary | ICD-10-CM

## 2024-02-22 DIAGNOSIS — I480 Paroxysmal atrial fibrillation: Secondary | ICD-10-CM | POA: Diagnosis not present

## 2024-02-22 DIAGNOSIS — I739 Peripheral vascular disease, unspecified: Secondary | ICD-10-CM

## 2024-02-22 DIAGNOSIS — I519 Heart disease, unspecified: Secondary | ICD-10-CM

## 2024-02-22 NOTE — Progress Notes (Signed)
 Cardiology Office Note   Date:  02/22/2024   ID:  Ronald Horne, DOB February 15, 1951, MRN [id number]  PCP:  Annelle Kiel, MD  Cardiologist:  Debborah Fairly, MD      History of Present Illness: Ronald Horne is a 73 y.o. male who presents for  Chief Complaint  Patient presents with   Follow-up    Cath Follow Up/ patient wasn't able to get cath done    [facility] to [facility] for right and left heart cath, waited 3 hours and left AMA. Cath got delayed for 3 hours      Past Medical History:  Diagnosis Date   Arthritis    Cardiomyopathy (HCC)    a. 08/2016 Echo: EF 30-35%, diff HK; b. 03/2018 Ex MV (Jadali): Ex time 2:00, large anter defect, EF 24%; c. 03/2018 Echo: EF 20-25%, diff HK. Gr1 DD; d. Refused cath.   Chronic back pain    Chronic combined systolic (congestive) and diastolic (congestive) heart failure (HCC)    a. 08/2016 Echo: EF 30-35%, diff HK, mild to mod MR, mild BAE; b. 03/2018 Echo: EF 20-25%, diff HK. Gr1 DD. MIldly dil Ao root. Mild BAE. Mildly reduced RV fxn.   GERD (gastroesophageal reflux disease)    Gout    Hypertension    Obesity      Past Surgical History:  Procedure Laterality Date   EPIDURAL BLOCK INJECTION     chronic back pain    LEFT HEART CATH AND CORONARY ANGIOGRAPHY Left 12/27/2019   Procedure: LEFT HEART CATH AND CORONARY ANGIOGRAPHY;  Surgeon: Cherrie Cornwall, MD;  Location: [facility] INVASIVE CV LAB;  Service: Cardiovascular;  Laterality: Left;   TONSILLECTOMY       Current Outpatient Medications  Medication Sig Dispense Refill   acetaminophen  (TYLENOL ) 500 MG tablet Take 500-1,000 mg by mouth every 6 (six) hours as needed (pain.).     albuterol  (VENTOLIN  HFA) 108 (90 Base) MCG/ACT inhaler Inhale 1-2 puffs into the lungs every 6 (six) hours as needed for shortness of breath or wheezing.     amiodarone  (PACERONE ) 400 MG tablet Take 1 tablet (400 mg total) by mouth daily. 60 tablet 0   apixaban  (ELIQUIS ) 5 MG TABS tablet Take 1 tablet (5 mg total) by  mouth 2 (two) times daily. 60 tablet 2   APPLE CIDER VINEGAR PO Take 15 mLs by mouth in the morning and at bedtime.     atorvastatin  (LIPITOR) 20 MG tablet Take 20 mg by mouth in the morning.     digoxin  (LANOXIN ) 0.125 MG tablet Take 1 tablet (125 mcg total) by mouth daily. 30 tablet 11   Ensifentrine  (OHTUVAYRE ) 3 MG/2.5ML SUSP Inhale 2.5 mLs into the lungs in the morning and at bedtime.     ENTRESTO  97-103 MG TAKE 1 TABLET BY MOUTH TWICE A DAY 60 tablet 6   FARXIGA  10 MG TABS tablet TAKE 1 TABLET BY MOUTH DAILY 30 tablet 6   furosemide  (LASIX ) 40 MG tablet Take 40 mg by mouth in the morning.     gabapentin (NEURONTIN) 300 MG capsule Take 300 mg by mouth at bedtime. (Patient not taking: Reported on 02/19/2024)     methimazole  (TAPAZOLE ) 5 MG tablet Take 5 mg by mouth in the morning.     metoprolol  succinate (TOPROL  XL) 100 MG 24 hr tablet Take 1 tablet (100 mg total) by mouth daily. Take with or immediately following a meal. 30 tablet 11   Multiple Vitamin (MULTIVITAMIN WITH MINERALS) TABS tablet Take  1 tablet by mouth daily.     OVER THE COUNTER MEDICATION Take 1 Dose by mouth daily. [place]     spironolactone  (ALDACTONE ) 25 MG tablet TAKE 1/2 TABLET BY MOUTH DAILY 15 tablet 6   torsemide  (DEMADEX ) 10 MG tablet Take 1 tablet (10 mg total) by mouth 2 (two) times daily. 60 tablet 11   No current facility-administered medications for this visit.    Allergies:   Patient has no known allergies.    Social History:   reports that he quit smoking about 47 years ago. His smoking use included cigarettes. He started smoking about 48 years ago. He has a 3 pack-year smoking history. He has never used smokeless tobacco. He reports that he does not drink alcohol and does not use drugs.   Family History:  family history is not on file.    ROS:     Review of Systems  Constitutional: Negative.   HENT: Negative.    Eyes: Negative.   Respiratory: Negative.    Gastrointestinal: Negative.    Genitourinary: Negative.   Musculoskeletal: Negative.   Skin: Negative.   Neurological: Negative.   Endo/Heme/Allergies: Negative.   Psychiatric/Behavioral: Negative.    All other systems reviewed and are negative.     All other systems are reviewed and negative.    PHYSICAL EXAM: VS:  BP (!) 90/52   Pulse 97   Ht 6\' 2"  (1.88 m)   Wt (!) 305 lb (138.3 kg)   SpO2 96%   BMI 39.16 kg/m  , BMI Body mass index is 39.16 kg/m. Last weight:  Wt Readings from Last 3 Encounters:  02/22/24 (!) 305 lb (138.3 kg)  02/19/24 300 lb (136.1 kg)  02/09/24 (!) 309 lb (140.2 kg)     Physical Exam Vitals reviewed.  Constitutional:      Appearance: Normal appearance. He is normal weight.  HENT:     Head: Normocephalic.     Nose: Nose normal.     Mouth/Throat:     Mouth: Mucous membranes are moist.  Eyes:     Pupils: Pupils are equal, round, and reactive to light.  Cardiovascular:     Rate and Rhythm: Normal rate and regular rhythm.     Pulses: Normal pulses.     Heart sounds: Normal heart sounds.  Pulmonary:     Effort: Pulmonary effort is normal.  Abdominal:     General: Abdomen is flat. Bowel sounds are normal.  Musculoskeletal:        General: Normal range of motion.     Cervical back: Normal range of motion.  Skin:    General: Skin is warm.  Neurological:     General: No focal deficit present.     Mental Status: He is alert.  Psychiatric:        Mood and Affect: Mood normal.       EKG:   Recent Labs: 05/19/2023: Magnesium 2.3 12/15/2023: B Natriuretic Peptide 415.6 02/09/2024: ALT 12; BUN 14; Creatinine, Ser 1.10; Hemoglobin 15.7; Platelets 235; Potassium 4.3; Sodium 139    Lipid Panel No results found for: "CHOL", "TRIG", "HDL", "CHOLHDL", "VLDL", "LDLCALC", "LDLDIRECT"    Other studies Reviewed: Additional studies/ records that were reviewed today include:  Review of the above records demonstrates:      04/12/2018   11:13 AM  PAD Screen  Previous PAD  dx? No  Previous surgical procedure? No  Pain with walking? No  Feet/toe relief with dangling? No  Painful, non-healing ulcers? No  Extremities discolored? No      ASSESSMENT AND PLAN:    ICD-10-CM   1. PAD (peripheral artery disease) (HCC)  I73.9     2. Primary hypertension  I10     3. Other chest pain  R07.89     4. Paroxysmal atrial fibrillation (HCC)  I48.0     5. Cardiopathy  I51.9     6. SOB (shortness of breath)  R06.02     7. Unstable angina (HCC)  I20.0    reschedual cath as was not done. cannot do it for a month.       Problem List Items Addressed This Visit       Cardiovascular and Mediastinum   Hypertension   PAD (peripheral artery disease) (HCC) - Primary   Cardiopathy   Unstable angina (HCC)     Other   SOB (shortness of breath)   Other chest pain   Other Visit Diagnoses       Paroxysmal atrial fibrillation (HCC)              Disposition:   Return in about 2 weeks (around 03/07/2024).    Total time spent: 30 minutes  Signed,  Debborah Fairly, MD  02/22/2024 10:57 AM    [facility]

## 2024-03-18 ENCOUNTER — Ambulatory Visit (INDEPENDENT_AMBULATORY_CARE_PROVIDER_SITE_OTHER): Admitting: Cardiovascular Disease

## 2024-03-18 ENCOUNTER — Encounter: Payer: Self-pay | Admitting: Cardiovascular Disease

## 2024-03-18 VITALS — BP 88/60 | HR 118 | Ht 74.0 in | Wt 301.0 lb

## 2024-03-18 DIAGNOSIS — I480 Paroxysmal atrial fibrillation: Secondary | ICD-10-CM | POA: Diagnosis not present

## 2024-03-18 DIAGNOSIS — I739 Peripheral vascular disease, unspecified: Secondary | ICD-10-CM | POA: Diagnosis not present

## 2024-03-18 DIAGNOSIS — Z0130 Encounter for examination of blood pressure without abnormal findings: Secondary | ICD-10-CM

## 2024-03-18 DIAGNOSIS — R0789 Other chest pain: Secondary | ICD-10-CM | POA: Diagnosis not present

## 2024-03-18 DIAGNOSIS — I519 Heart disease, unspecified: Secondary | ICD-10-CM

## 2024-03-18 MED ORDER — AMIODARONE HCL 200 MG PO TABS: ORAL_TABLET | ORAL | 0 refills | Status: DC

## 2024-03-18 MED ORDER — DIGOXIN 250 MCG PO TABS: 250.0000 ug | ORAL_TABLET | Freq: Every day | ORAL | 11 refills | Status: AC

## 2024-03-18 NOTE — Progress Notes (Signed)
 Cardiology Office Note   Date:  03/18/2024   ID:  Ronald Horne, DOB [birth year], MRN [id number]  PCP:  Annelle Kiel, MD  Cardiologist:  Debborah Fairly, MD      History of Present Illness: Ronald Horne is a 73 y.o. male who presents for  Chief Complaint  Patient presents with   Follow-up    3 weeks Follow Up    Has occasional SOB, but has DOE.      Past Medical History:  Diagnosis Date   Arthritis    Cardiomyopathy (HCC)    a. 08/2016 Echo: EF 30-35%, diff HK; b. 03/2018 Ex MV ([facility]): Ex time 2:00, large anter defect, EF 24%; c. 03/2018 Echo: EF 20-25%, diff HK. Gr1 DD; d. Refused cath.   Chronic back pain    Chronic combined systolic (congestive) and diastolic (congestive) heart failure (HCC)    a. 08/2016 Echo: EF 30-35%, diff HK, mild to mod MR, mild BAE; b. 03/2018 Echo: EF 20-25%, diff HK. Gr1 DD. MIldly dil Ao root. Mild BAE. Mildly reduced RV fxn.   GERD (gastroesophageal reflux disease)    Gout    Hypertension    Obesity      Past Surgical History:  Procedure Laterality Date   EPIDURAL BLOCK INJECTION     chronic back pain    LEFT HEART CATH AND CORONARY ANGIOGRAPHY Left 12/27/2019   Procedure: LEFT HEART CATH AND CORONARY ANGIOGRAPHY;  Surgeon: Cherrie Cornwall, MD;  Location: [facility];  Service: Cardiovascular;  Laterality: Left;   TONSILLECTOMY       Current Outpatient Medications  Medication Sig Dispense Refill   amiodarone  (PACERONE ) 200 MG tablet Take 4 tablets/day for 1 week, then 3 tablets/day for 1 week, then 2 tablets/day for 1 week. Then follow up. 70 tablet 0   digoxin  (LANOXIN ) 0.25 MG tablet Take 1 tablet (250 mcg total) by mouth daily. 30 tablet 11   acetaminophen  (TYLENOL ) 500 MG tablet Take 500-1,000 mg by mouth every 6 (six) hours as needed (pain.).     albuterol  (VENTOLIN  HFA) 108 (90 Base) MCG/ACT inhaler Inhale 1-2 puffs into the lungs every 6 (six) hours as needed for shortness of breath or wheezing.     apixaban   (ELIQUIS ) 5 MG TABS tablet Take 1 tablet (5 mg total) by mouth 2 (two) times daily. 60 tablet 2   APPLE CIDER VINEGAR PO Take 15 mLs by mouth in the morning and at bedtime.     atorvastatin  (LIPITOR) 20 MG tablet Take 20 mg by mouth in the morning.     Ensifentrine  (OHTUVAYRE ) 3 MG/2.5ML SUSP Inhale 2.5 mLs into the lungs in the morning and at bedtime.     ENTRESTO  97-103 MG TAKE 1 TABLET BY MOUTH TWICE A DAY 60 tablet 6   FARXIGA  10 MG TABS tablet TAKE 1 TABLET BY MOUTH DAILY 30 tablet 6   furosemide  (LASIX ) 40 MG tablet Take 40 mg by mouth in the morning.     gabapentin (NEURONTIN) 300 MG capsule Take 300 mg by mouth at bedtime. (Patient not taking: Reported on 02/19/2024)     methimazole  (TAPAZOLE ) 5 MG tablet Take 5 mg by mouth in the morning.     metoprolol  succinate (TOPROL  XL) 100 MG 24 hr tablet Take 1 tablet (100 mg total) by mouth daily. Take with or immediately following a meal. 30 tablet 11   Multiple Vitamin (MULTIVITAMIN WITH MINERALS) TABS tablet Take 1 tablet by mouth daily.  OVER THE COUNTER MEDICATION Take 1 Dose by mouth daily. [place]     spironolactone  (ALDACTONE ) 25 MG tablet TAKE 1/2 TABLET BY MOUTH DAILY 15 tablet 6   torsemide  (DEMADEX ) 10 MG tablet Take 1 tablet (10 mg total) by mouth 2 (two) times daily. 60 tablet 11   No current facility-administered medications for this visit.    Allergies:   Patient has no known allergies.    Social History:   reports that he quit smoking about 47 years ago. His smoking use included cigarettes. He started smoking about 48 years ago. He has a 3 pack-year smoking history. He has never used smokeless tobacco. He reports that he does not drink alcohol and does not use drugs.   Family History:  family history is not on file.    ROS:     Review of Systems  Constitutional: Negative.   HENT: Negative.    Eyes: Negative.   Respiratory: Negative.    Gastrointestinal: Negative.   Genitourinary: Negative.   Musculoskeletal:  Negative.   Skin: Negative.   Neurological: Negative.   Endo/Heme/Allergies: Negative.   Psychiatric/Behavioral: Negative.    All other systems reviewed and are negative.     All other systems are reviewed and negative.    PHYSICAL EXAM: VS:  BP (!) 88/60   Pulse (!) 118   Ht 6\' 2"  (1.88 m)   Wt (!) 301 lb (136.5 kg)   SpO2 98%   BMI 38.65 kg/m  , BMI Body mass index is 38.65 kg/m. Last weight:  Wt Readings from Last 3 Encounters:  03/18/24 (!) 301 lb (136.5 kg)  02/22/24 (!) 305 lb (138.3 kg)  02/19/24 300 lb (136.1 kg)     Physical Exam Vitals reviewed.  Constitutional:      Appearance: Normal appearance. He is normal weight.  HENT:     Head: Normocephalic.     Nose: Nose normal.     Mouth/Throat:     Mouth: Mucous membranes are moist.  Eyes:     Pupils: Pupils are equal, round, and reactive to light.  Cardiovascular:     Rate and Rhythm: Normal rate and regular rhythm.     Pulses: Normal pulses.     Heart sounds: Normal heart sounds.  Pulmonary:     Effort: Pulmonary effort is normal.  Abdominal:     General: Abdomen is flat. Bowel sounds are normal.  Musculoskeletal:        General: Normal range of motion.     Cervical back: Normal range of motion.  Skin:    General: Skin is warm.  Neurological:     General: No focal deficit present.     Mental Status: He is alert.  Psychiatric:        Mood and Affect: Mood normal.       EKG:   Recent Labs: 05/19/2023: Magnesium 2.3 12/15/2023: B Natriuretic Peptide 415.6 02/09/2024: ALT 12; BUN 14; Creatinine, Ser 1.10; Hemoglobin 15.7; Platelets 235; Potassium 4.3; Sodium 139    Lipid Panel No results found for: "CHOL", "TRIG", "HDL", "CHOLHDL", "VLDL", "LDLCALC", "LDLDIRECT"    Other studies Reviewed: Additional studies/ records that were reviewed today include:  Review of the above records demonstrates:      04/12/2018   11:13 AM  PAD Screen  Previous PAD dx? No  Previous surgical procedure? No   Pain with walking? No  Feet/toe relief with dangling? No  Painful, non-healing ulcers? No  Extremities discolored? No  ASSESSMENT AND PLAN:    ICD-10-CM   1. Cardiopathy  I51.9 amiodarone  (PACERONE ) 200 MG tablet    digoxin  (LANOXIN ) 0.25 MG tablet    2. Paroxysmal atrial fibrillation (HCC)  I48.0 amiodarone  (PACERONE ) 200 MG tablet    digoxin  (LANOXIN ) 0.25 MG tablet   Go back to amiodrone, loading dose 800 daily and change digoxin  0.25 daily as HR 118/min as in Afib with RVR    3. Other chest pain  R07.89 amiodarone  (PACERONE ) 200 MG tablet    digoxin  (LANOXIN ) 0.25 MG tablet    4. PAD (peripheral artery disease) (HCC)  I73.9 amiodarone  (PACERONE ) 200 MG tablet    digoxin  (LANOXIN ) 0.25 MG tablet       Problem List Items Addressed This Visit       Cardiovascular and Mediastinum   PAD (peripheral artery disease) (HCC)   Relevant Medications   amiodarone  (PACERONE ) 200 MG tablet   digoxin  (LANOXIN ) 0.25 MG tablet   Cardiopathy - Primary   Relevant Medications   amiodarone  (PACERONE ) 200 MG tablet   digoxin  (LANOXIN ) 0.25 MG tablet     Other   Other chest pain   Relevant Medications   amiodarone  (PACERONE ) 200 MG tablet   digoxin  (LANOXIN ) 0.25 MG tablet   Other Visit Diagnoses       Paroxysmal atrial fibrillation (HCC)       Go back to amiodrone, loading dose 800 daily and change digoxin  0.25 daily as HR 118/min as in Afib with RVR   Relevant Medications   amiodarone  (PACERONE ) 200 MG tablet   digoxin  (LANOXIN ) 0.25 MG tablet          Disposition:   Return in about 4 weeks (around 04/15/2024).    Total time spent: 30 minutes  Signed,  Debborah Fairly, MD  03/18/2024 10:05 AM    [facility]

## 2024-03-25 ENCOUNTER — Emergency Department

## 2024-03-25 ENCOUNTER — Emergency Department
Admission: EM | Admit: 2024-03-25 | Discharge: 2024-03-25 | Disposition: A | Discharge status: Home / Self Care | Attending: Emergency Medicine | Admitting: Emergency Medicine

## 2024-03-25 DIAGNOSIS — J441 Chronic obstructive pulmonary disease with (acute) exacerbation: Secondary | ICD-10-CM | POA: Insufficient documentation

## 2024-03-25 DIAGNOSIS — E876 Hypokalemia: Secondary | ICD-10-CM | POA: Insufficient documentation

## 2024-03-25 DIAGNOSIS — E871 Hypo-osmolality and hyponatremia: Secondary | ICD-10-CM | POA: Insufficient documentation

## 2024-03-25 DIAGNOSIS — R0602 Shortness of breath: Secondary | ICD-10-CM

## 2024-03-25 DIAGNOSIS — I509 Heart failure, unspecified: Secondary | ICD-10-CM | POA: Insufficient documentation

## 2024-03-25 LAB — CBC
HCT: 44.9 % (ref 39.0–52.0)
Hemoglobin: 15.2 g/dL (ref 13.0–17.0)
MCH: 30.8 pg (ref 26.0–34.0)
MCHC: 33.9 g/dL (ref 30.0–36.0)
MCV: 91.1 fL (ref 80.0–100.0)
Platelets: 250 10*3/uL (ref 150–400)
RBC: 4.93 MIL/uL (ref 4.22–5.81)
RDW: 14.4 % (ref 11.5–15.5)
WBC: 6.4 10*3/uL (ref 4.0–10.5)
nRBC: 0 % (ref 0.0–0.2)

## 2024-03-25 LAB — BASIC METABOLIC PANEL WITH GFR
Anion gap: 11 (ref 5–15)
BUN: 15 mg/dL (ref 8–23)
CO2: 20 mmol/L — ABNORMAL LOW (ref 22–32)
Calcium: 8.3 mg/dL — ABNORMAL LOW (ref 8.9–10.3)
Chloride: 102 mmol/L (ref 98–111)
Creatinine, Ser: 1.2 mg/dL (ref 0.61–1.24)
GFR, Estimated: >60 mL/min (ref >60)
Glucose, Bld: 137 mg/dL — ABNORMAL HIGH (ref 70–99)
Potassium: 3.3 mmol/L — ABNORMAL LOW (ref 3.5–5.1)
Sodium: 133 mmol/L — ABNORMAL LOW (ref 135–145)

## 2024-03-25 LAB — BRAIN NATRIURETIC PEPTIDE: B Natriuretic Peptide: 600.6 pg/mL — ABNORMAL HIGH (ref 0.0–100.0)

## 2024-03-25 MED ORDER — METHYLPREDNISOLONE SODIUM SUCC 125 MG IJ SOLR
125.0000 mg | Freq: Once | INTRAMUSCULAR | Status: AC
  Administered 2024-03-25: 125 mg via INTRAVENOUS
  Filled 2024-03-25: qty 2

## 2024-03-25 MED ORDER — IPRATROPIUM-ALBUTEROL 0.5-2.5 (3) MG/3ML IN SOLN
6.0000 mL | Freq: Once | RESPIRATORY_TRACT | Status: AC
  Administered 2024-03-25: 6 mL via RESPIRATORY_TRACT
  Filled 2024-03-25: qty 6

## 2024-03-25 NOTE — ED Provider Notes (Signed)
 [facility] Provider Note   Event Date/Time   First MD Initiated Contact with Patient 03/25/24 [phone]     (approximate) History  Shortness of Breath  HPI Ronald Horne is a 73 y.o. male with a past medical history of CHF and COPD who presents for shortness of breath.  Patient states that he has had worsening shortness of breath since 1 AM this morning.  Patient states that he has been taking his breathing treatments as well as tried his home CPAP machine with no relief.  Patient did have medication change recently does not know which medication it was.  Patient has labored respirations and tachypneic but speaking in complete sentences.  Patient states that he has an appointment with his pulmonologist within the next week. ROS: Patient currently denies any vision changes, tinnitus, difficulty speaking, facial droop, sore throat, chest pain, abdominal pain, nausea/vomiting/diarrhea, dysuria, or weakness/numbness/paresthesias in any extremity   Physical Exam  Triage Vital Signs: ED Triage Vitals  Encounter Vitals Group     BP      Systolic BP Percentile      Diastolic BP Percentile      Pulse      Resp      Temp      Temp src      SpO2      Weight      Height      Head Circumference      Peak Flow      Pain Score      Pain Loc      Pain Education      Exclude from Growth Chart    Most recent vital signs: Vitals:   03/25/24 0500 03/25/24 0600  BP: 116/73 112/81  Pulse: (!) 112 (!) 115  Resp:  (!) 22  Temp:    SpO2: 95% 92%   General: Awake, oriented x4. CV:  Good peripheral perfusion.  Resp:  Increased effort.  Inspiratory and expiratory wheezing over bilateral lung fields Abd:  No distention.  Other:  Middle-aged obese African-American male resting in stretcher in mild respiratory distress ED Results / Procedures / Treatments  Labs (all labs ordered are listed, but only abnormal results are displayed) Labs Reviewed  BRAIN NATRIURETIC PEPTIDE -  Abnormal; Notable for the following components:      Result Value   B Natriuretic Peptide 600.6 (*)    All other components within normal limits  BASIC METABOLIC PANEL WITH GFR - Abnormal; Notable for the following components:   Sodium 133 (*)    Potassium 3.3 (*)    CO2 20 (*)    Glucose, Bld 137 (*)    Calcium  8.3 (*)    All other components within normal limits  CBC   EKG ED ECG REPORT I, Charleen Conn, the attending physician, personally viewed and interpreted this ECG. Date: 03/25/2024 EKG Time: 0327 Rate: 127 Rhythm: Tachycardic sinus rhythm QRS Axis: normal Intervals: Left bundle branch block ST/T Wave abnormalities: normal Narrative Interpretation: Tachycardic sinus rhythm with left bundle branch block.  No evidence of acute ischemia RADIOLOGY ED MD interpretation: One-view portable chest x-ray independently interpreted and shows enlargement of pericardial silhouette with pulmonary vascular congestion -Agree with radiology assessment Official radiology report(s): DG Chest Port 1 View Result Date: 03/25/2024 CLINICAL DATA:  Shortness of breath. EXAM: PORTABLE CHEST 1 VIEW COMPARISON:  01/31/2024 FINDINGS: The cardio pericardial silhouette is enlarged. There is pulmonary vascular congestion without overt pulmonary edema. No focal consolidation or substantial  pleural effusion. No acute bony abnormality. Telemetry leads overlie the chest. IMPRESSION: Enlargement of the cardiopericardial silhouette with pulmonary vascular congestion. Electronically Signed   By: Donnal Fusi M.D.   On: 03/25/2024 05:05   PROCEDURES: Critical Care performed: No .1-3 Lead EKG Interpretation  Performed by: Charleen Conn, MD Authorized by: Charleen Conn, MD     Interpretation: normal     ECG rate:  111   ECG rate assessment: tachycardic     Rhythm: sinus rhythm     Ectopy: none     Conduction: normal   Comments:     LBBB  MEDICATIONS ORDERED IN ED: Medications   ipratropium-albuterol  (DUONEB) 0.5-2.5 (3) MG/3ML nebulizer solution 6 mL (6 mLs Nebulization Given 03/25/24 0349)  methylPREDNISolone  sodium succinate (SOLU-MEDROL ) 125 mg/2 mL injection 125 mg (125 mg Intravenous Given 03/25/24 0355)   IMPRESSION / MDM / ASSESSMENT AND PLAN / ED COURSE  I reviewed the triage vital signs and the nursing notes.                             The patient is on the cardiac monitor to evaluate for evidence of arrhythmia and/or significant heart rate changes. Patient's presentation is most consistent with acute presentation with potential threat to life or bodily function. The patient appears to be suffering from a moderate/severe exacerbation of COPD.  Based on the history, exam, CXR/EKG reviewed by me, and further workup I don't suspect any other emergent cause of this presentation, such as pneumonia, acute coronary syndrome, congestive heart failure, pulmonary embolism, or pneumothorax.  ED Interventions: bronchodilators, steroids, antibiotics, reassess  Reassessment: After treatment, the patient's shortness of breath is improving but patient is still requiring supplemental oxygenation   This patient has elected to leave against medical advice. In my opinion, the patient has capacity to leave AMA. The patient is clinically sober, free from distracting injury, appears to have intact insight and judgment and reason, and in my opinion has capacity to make decisions. I explained to the patient that his symptoms may represent COPD exacerbation and the patient verbalized understanding of my concerns.  I had a discussion with the patient about their workup and results, and that they may still have COPD exacerbation and continued oxygen requirement despite treatment. I informed the patient that the next step in diagnosis and treatment would be continued supplemental oxygen and breathing treatments, and they verbalized understanding of this as well. I explained the risks of  leaving without further workup or treatment, which included reasonably foreseeable complications such as death, serious injury, permanent disability, and respiratory distress. I also offered alternatives to departing AMA such as assigning the patient a different provider or an alternate workup pathway.  The patient is refusing any further care, specifically admission, and is leaving against medical advice. I am unable to convince the patient to stay. I have asked them to return as soon as possible to complete their evaluation, and also explained that they were welcome to return to the ER for further evaluation whenever they choose. I have asked the patient to follow up with their primary doctor as soon as possible. I have answered all their questions. Patient did not sign AMA paperwork.  Disposition: AMA   FINAL CLINICAL IMPRESSION(S) / ED DIAGNOSES   Final diagnoses:  COPD exacerbation (HCC)  SOB (shortness of breath)   Rx / DC Orders   ED Discharge Orders     None  Note:  This document was prepared using Dragon voice recognition software and may include unintentional dictation errors.   Charleen Conn, MD 03/25/24 [phone]

## 2024-03-25 NOTE — ED Notes (Signed)
 Pt placed on 2L O2 due to O2 dropping to 90%

## 2024-03-25 NOTE — ED Triage Notes (Signed)
 Pt to ED from home with SOB since 0100 this morning. Pt states he woke up and did a breathing treatment and went on CPAP at home with no relief. States he recently had a medication change but is unsure what it is. Pt respirations labored and tachypneic w/ grunting but speaking in complete sentences. A&O x4

## 2024-04-04 ENCOUNTER — Other Ambulatory Visit: Payer: Self-pay

## 2024-04-04 ENCOUNTER — Emergency Department

## 2024-04-04 DIAGNOSIS — I5042 Chronic combined systolic (congestive) and diastolic (congestive) heart failure: Secondary | ICD-10-CM | POA: Diagnosis not present

## 2024-04-04 DIAGNOSIS — J441 Chronic obstructive pulmonary disease with (acute) exacerbation: Secondary | ICD-10-CM | POA: Diagnosis not present

## 2024-04-04 DIAGNOSIS — Z79899 Other long term (current) drug therapy: Secondary | ICD-10-CM | POA: Insufficient documentation

## 2024-04-04 DIAGNOSIS — I110 Hypertensive heart disease with heart failure: Secondary | ICD-10-CM | POA: Insufficient documentation

## 2024-04-04 DIAGNOSIS — R0602 Shortness of breath: Secondary | ICD-10-CM | POA: Diagnosis present

## 2024-04-04 DIAGNOSIS — Z7901 Long term (current) use of anticoagulants: Secondary | ICD-10-CM | POA: Insufficient documentation

## 2024-04-04 LAB — CBC WITH DIFFERENTIAL/PLATELET
Abs Immature Granulocytes: 0.02 10*3/uL (ref 0.00–0.07)
Basophils Absolute: 0 10*3/uL (ref 0.0–0.1)
Basophils Relative: 0 %
Eosinophils Absolute: 0.1 10*3/uL (ref 0.0–0.5)
Eosinophils Relative: 1 %
HCT: 43.1 % (ref 39.0–52.0)
Hemoglobin: 14.4 g/dL (ref 13.0–17.0)
Immature Granulocytes: 0 %
Lymphocytes Relative: 28 %
Lymphs Abs: 2.3 10*3/uL (ref 0.7–4.0)
MCH: 30.2 pg (ref 26.0–34.0)
MCHC: 33.4 g/dL (ref 30.0–36.0)
MCV: 90.4 fL (ref 80.0–100.0)
Monocytes Absolute: 0.8 10*3/uL (ref 0.1–1.0)
Monocytes Relative: 10 %
Neutro Abs: 5.1 10*3/uL (ref 1.7–7.7)
Neutrophils Relative %: 61 %
Platelets: 228 10*3/uL (ref 150–400)
RBC: 4.77 MIL/uL (ref 4.22–5.81)
RDW: 14.3 % (ref 11.5–15.5)
WBC: 8.4 10*3/uL (ref 4.0–10.5)
nRBC: 0 % (ref 0.0–0.2)

## 2024-04-04 LAB — BASIC METABOLIC PANEL WITH GFR
Anion gap: 8 (ref 5–15)
BUN: 10 mg/dL (ref 8–23)
CO2: 23 mmol/L (ref 22–32)
Calcium: 8.2 mg/dL — ABNORMAL LOW (ref 8.9–10.3)
Chloride: 106 mmol/L (ref 98–111)
Creatinine, Ser: 1.18 mg/dL (ref 0.61–1.24)
GFR, Estimated: >60 mL/min (ref >60)
Glucose, Bld: 148 mg/dL — ABNORMAL HIGH (ref 70–99)
Potassium: 3.3 mmol/L — ABNORMAL LOW (ref 3.5–5.1)
Sodium: 137 mmol/L (ref 135–145)

## 2024-04-04 LAB — TROPONIN I (HIGH SENSITIVITY): Troponin I (High Sensitivity): 31 ng/L — ABNORMAL HIGH (ref <18)

## 2024-04-04 LAB — BRAIN NATRIURETIC PEPTIDE: B Natriuretic Peptide: 585 pg/mL — ABNORMAL HIGH (ref 0.0–100.0)

## 2024-04-04 NOTE — ED Triage Notes (Signed)
 Pt reports he was asleep on his cpap he woke up and had some drink and then became short of breath. Pt reports worse with exertion. Pt denies pain.

## 2024-04-05 ENCOUNTER — Emergency Department
Admission: EM | Admit: 2024-04-05 | Discharge: 2024-04-05 | Disposition: A | Discharge status: Home / Self Care | Attending: Emergency Medicine | Admitting: Emergency Medicine

## 2024-04-05 DIAGNOSIS — J441 Chronic obstructive pulmonary disease with (acute) exacerbation: Secondary | ICD-10-CM | POA: Diagnosis not present

## 2024-04-05 LAB — D-DIMER, QUANTITATIVE: D-Dimer, Quant: 0.3 ug{FEU}/mL (ref 0.00–0.50)

## 2024-04-05 LAB — TROPONIN I (HIGH SENSITIVITY): Troponin I (High Sensitivity): 31 ng/L — ABNORMAL HIGH (ref <18)

## 2024-04-05 MED ORDER — ALBUTEROL SULFATE HFA 108 (90 BASE) MCG/ACT IN AERS
2.0000 | INHALATION_SPRAY | Freq: Once | RESPIRATORY_TRACT | Status: AC
  Administered 2024-04-05: 2 via RESPIRATORY_TRACT
  Filled 2024-04-05: qty 6.7

## 2024-04-05 MED ORDER — IPRATROPIUM-ALBUTEROL 0.5-2.5 (3) MG/3ML IN SOLN
3.0000 mL | Freq: Once | RESPIRATORY_TRACT | Status: AC
  Administered 2024-04-05: 3 mL via RESPIRATORY_TRACT
  Filled 2024-04-05: qty 3

## 2024-04-05 MED ORDER — METHYLPREDNISOLONE SODIUM SUCC 125 MG IJ SOLR
125.0000 mg | Freq: Once | INTRAMUSCULAR | Status: AC
  Administered 2024-04-05: 125 mg via INTRAVENOUS
  Filled 2024-04-05: qty 2

## 2024-04-05 MED ORDER — PREDNISONE 20 MG PO TABS: 60.0000 mg | ORAL_TABLET | Freq: Every day | ORAL | 0 refills | Status: DC

## 2024-04-05 NOTE — ED Notes (Signed)
 MD aware pt states he is ready to go.

## 2024-04-05 NOTE — ED Provider Notes (Signed)
 [facility] Provider Note    Event Date/Time   First MD Initiated Contact with Patient 04/05/24 [phone]     (approximate)   History   Shortness of Breath   HPI  Ronald Horne is a 73 y.o. male with history of COPD, cardiomyopathy, hypertension, atrial tachycardia on Eliquis  who presents to the emergency department shortness of breath.  No chest discomfort.  No fevers, cough.  States this feels similar to when he was in the hospital and he needed breathing treatments and steroids.  He has a pulmonologist for follow-up.  States symptoms improved at home after using his albuterol  inhaler.   History provided by patient.    Past Medical History:  Diagnosis Date   Arthritis    Cardiomyopathy (HCC)    a. 08/2016 Echo: EF 30-35%, diff HK; b. 03/2018 Ex MV (Jadali): Ex time 2:00, large anter defect, EF 24%; c. 03/2018 Echo: EF 20-25%, diff HK. Gr1 DD; d. Refused cath.   Chronic back pain    Chronic combined systolic (congestive) and diastolic (congestive) heart failure (HCC)    a. 08/2016 Echo: EF 30-35%, diff HK, mild to mod MR, mild BAE; b. 03/2018 Echo: EF 20-25%, diff HK. Gr1 DD. MIldly dil Ao root. Mild BAE. Mildly reduced RV fxn.   GERD (gastroesophageal reflux disease)    Gout    Hypertension    Obesity     Past Surgical History:  Procedure Laterality Date   EPIDURAL BLOCK INJECTION     chronic back pain    LEFT HEART CATH AND CORONARY ANGIOGRAPHY Left 12/27/2019   Procedure: LEFT HEART CATH AND CORONARY ANGIOGRAPHY;  Surgeon: Cherrie Cornwall, MD;  Location: [facility] INVASIVE CV LAB;  Service: Cardiovascular;  Laterality: Left;   TONSILLECTOMY      MEDICATIONS:  Prior to Admission medications   Medication Sig Start Date End Date Taking? Authorizing Provider  acetaminophen  (TYLENOL ) 500 MG tablet Take 500-1,000 mg by mouth every 6 (six) hours as needed (pain.).    [provider]  albuterol  (VENTOLIN  HFA) 108 (90 Base) MCG/ACT inhaler Inhale 1-2  puffs into the lungs every 6 (six) hours as needed for shortness of breath or wheezing. 03/15/23   [provider]  amiodarone  (PACERONE ) 200 MG tablet Take 4 tablets/day for 1 week, then 3 tablets/day for 1 week, then 2 tablets/day for 1 week. Then follow up. 03/18/24   Cherrie Cornwall, MD  apixaban  (ELIQUIS ) 5 MG TABS tablet Take 1 tablet (5 mg total) by mouth 2 (two) times daily. 02/09/24 03/10/24  Cherrie Cornwall, MD  APPLE CIDER VINEGAR PO Take 15 mLs by mouth in the morning and at bedtime.    [provider]  atorvastatin  (LIPITOR) 20 MG tablet Take 20 mg by mouth in the morning.    [provider]  digoxin  (LANOXIN ) 0.25 MG tablet Take 1 tablet (250 mcg total) by mouth daily. 03/18/24 03/18/25  Cherrie Cornwall, MD  Ensifentrine  (OHTUVAYRE ) 3 MG/2.5ML SUSP Inhale 2.5 mLs into the lungs in the morning and at bedtime.    [provider]  ENTRESTO  97-103 MG TAKE 1 TABLET BY MOUTH TWICE A DAY 12/14/23   Cherrie Cornwall, MD  FARXIGA  10 MG TABS tablet TAKE 1 TABLET BY MOUTH DAILY 11/13/23   Cherrie Cornwall, MD  furosemide  (LASIX ) 40 MG tablet Take 40 mg by mouth in the morning.    [provider]  gabapentin (NEURONTIN) 300 MG capsule Take 300 mg by mouth  at bedtime. Patient not taking: Reported on 02/19/2024 10/23/23   [provider]  methimazole  (TAPAZOLE ) 5 MG tablet Take 5 mg by mouth in the morning.    [provider]  metoprolol  succinate (TOPROL  XL) 100 MG 24 hr tablet Take 1 tablet (100 mg total) by mouth daily. Take with or immediately following a meal. 01/12/24 01/11/25  Cherrie Cornwall, MD  Multiple Vitamin (MULTIVITAMIN WITH MINERALS) TABS tablet Take 1 tablet by mouth daily.    [provider]  OVER THE COUNTER MEDICATION Take 1 Dose by mouth daily. [employer], Historical, MD  spironolactone  (ALDACTONE ) 25 MG tablet TAKE 1/2 TABLET BY MOUTH DAILY 12/14/23   Cherrie Cornwall, MD  torsemide  (DEMADEX ) 10 MG tablet Take 1  tablet (10 mg total) by mouth 2 (two) times daily. 02/09/24 02/08/25  Cherrie Cornwall, MD    Physical Exam   Triage Vital Signs: ED Triage Vitals  Encounter Vitals Group     BP 04/04/24 2137 108/77     Systolic BP Percentile --      Diastolic BP Percentile --      Pulse Rate 04/04/24 2137 (!) 126     Resp 04/04/24 2137 (!) 24     Temp 04/04/24 2137 98.6 F (37 C)     Temp Source 04/04/24 2137 Oral     SpO2 04/04/24 2137 95 %     Weight 04/04/24 2136 (!) 302 lb (137 kg)     Height 04/04/24 2136 6\' 2"  (1.88 m)     Head Circumference --      Peak Flow --      Pain Score 04/04/24 2136 0     Pain Loc --      Pain Education --      Exclude from Growth Chart --     Most recent vital signs: Vitals:   04/04/24 2137 04/05/24 0048  BP: 108/77 120/70  Pulse: (!) 126 (!) 112  Resp: (!) 24 (!) 22  Temp: 98.6 F (37 C) 97.8 F (36.6 C)  SpO2: 95% 95%    CONSTITUTIONAL: Alert, responds appropriately to questions. Well-appearing; well-nourished HEAD: Normocephalic, atraumatic EYES: Conjunctivae clear, pupils appear equal, sclera nonicteric ENT: normal nose; moist mucous membranes NECK: Supple, normal ROM, no JVD appreciated CARD: Regular and tachycardic; S1 and S2 appreciated RESP: Normal chest excursion without splinting or tachypnea; breath sounds clear and equal bilaterally; no wheezes, no rhonchi, no rales, no hypoxia or respiratory distress, speaking full sentences ABD/GI: Non-distended; soft, non-tender, no rebound, no guarding, no peritoneal signs BACK: The back appears normal EXT: Normal ROM in all joints; no deformity noted, no edema, no calf tenderness or calf swelling SKIN: Normal color for age and race; warm; no rash on exposed skin NEURO: Moves all extremities equally, normal speech PSYCH: The patient's mood and manner are appropriate.   ED Results / Procedures / Treatments   LABS: (all labs ordered are listed, but only abnormal results are displayed) Labs Reviewed   BASIC METABOLIC PANEL WITH GFR - Abnormal; Notable for the following components:      Result Value   Potassium 3.3 (*)    Glucose, Bld 148 (*)    Calcium  8.2 (*)    All other components within normal limits  BRAIN NATRIURETIC PEPTIDE - Abnormal; Notable for the following components:   B Natriuretic Peptide 585.0 (*)    All other components within normal limits  TROPONIN I (HIGH SENSITIVITY) - Abnormal; Notable for  the following components:   Troponin I (High Sensitivity) 31 (*)    All other components within normal limits  TROPONIN I (HIGH SENSITIVITY) - Abnormal; Notable for the following components:   Troponin I (High Sensitivity) 31 (*)    All other components within normal limits  CBC WITH DIFFERENTIAL/PLATELET  D-DIMER, QUANTITATIVE     EKG:  EKG Interpretation Date/Time:  Thursday April 04 2024 21:40:32 EDT Ventricular Rate:  127 PR Interval:  112 QRS Duration:  136 QT Interval:  370 QTC Calculation: 537 R Axis:   -53  Text Interpretation: Sinus tachycardia Left axis deviation Left bundle branch block Abnormal ECG When compared with ECG of 25-Mar-2024 03:27, PREVIOUS ECG IS PRESENT Confirmed by Verneda Golder [phone]) on 04/05/2024 1:15:13 AM         RADIOLOGY: My personal review and interpretation of imaging: Chest x-ray shows no edema, infiltrate.  I have personally reviewed all radiology reports.   DG Chest 1 View Result Date: 04/04/2024 CLINICAL DATA:  Dyspnea EXAM: CHEST  1 VIEW COMPARISON:  03/25/2024 FINDINGS: Lungs are symmetrically well expanded. No pneumothorax or pleural effusion. Stable cardiomegaly. Central pulmonary vascular congestion noted without overt pulmonary edema. No acute bone abnormality. IMPRESSION: 1. Cardiomegaly. Central pulmonary vascular congestion without overt pulmonary edema. Electronically Signed   By: Worthy Heads M.D.   On: 04/04/2024 22:05     PROCEDURES:  Critical Care performed: No    .1-3 Lead EKG  Interpretation  Performed by: Terisa Belardo, Clover Dao, DO Authorized by: Chiante Peden, Clover Dao, DO     Interpretation: abnormal     ECG rate:  112   ECG rate assessment: tachycardic     Rhythm: sinus tachycardia     Ectopy: none     Conduction: normal       IMPRESSION / MDM / ASSESSMENT AND PLAN / ED COURSE  I reviewed the triage vital signs and the nursing notes.    Patient with complaints of shortness of breath.  States he feels like he needs a breathing treatment and steroids.  Does not feel like his nebulizer at home is working.  The patient is on the cardiac monitor to evaluate for evidence of arrhythmia and/or significant heart rate changes.   DIFFERENTIAL DIAGNOSIS (includes but not limited to):   COPD exacerbation, CHF exacerbation, ACS, PE, pneumonia, pneumothorax anemia   Patient's presentation is most consistent with acute presentation with potential threat to life or bodily function.   PLAN: Workup initiated from triage.  Normal hemoglobin.  First troponin is 31.  This appears to be near his baseline.  Repeat pending.  BNP is elevated at 585 however he does not appear volume overloaded on exam and chest x-ray reviewed and interpreted by myself and the radiologist and shows cardiomegaly but no overt edema.  Will obtain D-dimer, second troponin.  Will give DuoNeb, Solu-Medrol .   MEDICATIONS GIVEN IN ED: Medications  ipratropium-albuterol  (DUONEB) 0.5-2.5 (3) MG/3ML nebulizer solution 3 mL (3 mLs Nebulization Given 04/05/24 0142)  methylPREDNISolone  sodium succinate (SOLU-MEDROL ) 125 mg/2 mL injection 125 mg (125 mg Intravenous Given 04/05/24 0146)  albuterol  (VENTOLIN  HFA) 108 (90 Base) MCG/ACT inhaler 2 puff (2 puffs Inhalation Given 04/05/24 0239)     ED COURSE: Second troponin flat.  D-dimer negative.  Patient feeling better and asking for discharge home.  He has outpatient follow-up.  Will discharge with albuterol  inhaler because he states he does not feel like his inhaler is  working correctly.  Will also discharge on a prednisone burst.  At this time, I do not feel there is any life-threatening condition present. I reviewed all nursing notes, vitals, pertinent previous records.  All lab and urine results, EKGs, imaging ordered have been independently reviewed and interpreted by myself.  I reviewed all available radiology reports from any imaging ordered this visit.  Based on my assessment, I feel the patient is safe to be discharged home without further emergent workup and can continue workup as an outpatient as needed. Discussed all findings, treatment plan as well as usual and customary return precautions.  They verbalize understanding and are comfortable with this plan.  Outpatient follow-up has been provided as needed.  All questions have been answered.    CONSULTS: Admission considered but patient feeling better and requesting discharge home.   OUTSIDE RECORDS REVIEWED: Reviewed recent cardiology, pulmonology notes.       FINAL CLINICAL IMPRESSION(S) / ED DIAGNOSES   Final diagnoses:  COPD exacerbation (HCC)     Rx / DC Orders   ED Discharge Orders          Ordered    predniSONE (DELTASONE) 20 MG tablet  Daily        04/05/24 0248             Note:  This document was prepared using Dragon voice recognition software and may include unintentional dictation errors.   Hence Derrick, Clover Dao, DO 04/05/24 [phone]

## 2024-04-10 ENCOUNTER — Other Ambulatory Visit: Payer: Self-pay

## 2024-04-10 ENCOUNTER — Encounter: Payer: Self-pay | Admitting: Emergency Medicine

## 2024-04-10 ENCOUNTER — Emergency Department
Admission: EM | Admit: 2024-04-10 | Discharge: 2024-04-11 | Disposition: A | Discharge status: Home / Self Care | Attending: Emergency Medicine | Admitting: Emergency Medicine

## 2024-04-10 ENCOUNTER — Emergency Department

## 2024-04-10 DIAGNOSIS — J449 Chronic obstructive pulmonary disease, unspecified: Secondary | ICD-10-CM | POA: Diagnosis not present

## 2024-04-10 DIAGNOSIS — I10 Essential (primary) hypertension: Secondary | ICD-10-CM | POA: Diagnosis not present

## 2024-04-10 DIAGNOSIS — R0789 Other chest pain: Secondary | ICD-10-CM | POA: Insufficient documentation

## 2024-04-10 DIAGNOSIS — Z7901 Long term (current) use of anticoagulants: Secondary | ICD-10-CM | POA: Diagnosis not present

## 2024-04-10 DIAGNOSIS — R079 Chest pain, unspecified: Secondary | ICD-10-CM

## 2024-04-10 LAB — HEPATIC FUNCTION PANEL
ALT: 19 U/L (ref 0–44)
AST: 20 U/L (ref 15–41)
Albumin: 3.2 g/dL — ABNORMAL LOW (ref 3.5–5.0)
Alkaline Phosphatase: 51 U/L (ref 38–126)
Bilirubin, Direct: 0.2 mg/dL (ref 0.0–0.2)
Indirect Bilirubin: 0.8 mg/dL (ref 0.3–0.9)
Total Bilirubin: 1 mg/dL (ref 0.0–1.2)
Total Protein: 6.7 g/dL (ref 6.5–8.1)

## 2024-04-10 LAB — CBC
HCT: 45.1 % (ref 39.0–52.0)
Hemoglobin: 15.4 g/dL (ref 13.0–17.0)
MCH: 30.7 pg (ref 26.0–34.0)
MCHC: 34.1 g/dL (ref 30.0–36.0)
MCV: 90 fL (ref 80.0–100.0)
Platelets: 259 10*3/uL (ref 150–400)
RBC: 5.01 MIL/uL (ref 4.22–5.81)
RDW: 14 % (ref 11.5–15.5)
WBC: 12.6 10*3/uL — ABNORMAL HIGH (ref 4.0–10.5)
nRBC: 0 % (ref 0.0–0.2)

## 2024-04-10 LAB — BASIC METABOLIC PANEL WITH GFR
Anion gap: 12 (ref 5–15)
BUN: 31 mg/dL — ABNORMAL HIGH (ref 8–23)
CO2: 26 mmol/L (ref 22–32)
Calcium: 8.1 mg/dL — ABNORMAL LOW (ref 8.9–10.3)
Chloride: 99 mmol/L (ref 98–111)
Creatinine, Ser: 1.29 mg/dL — ABNORMAL HIGH (ref 0.61–1.24)
GFR, Estimated: 59 mL/min — ABNORMAL LOW (ref >60)
Glucose, Bld: 115 mg/dL — ABNORMAL HIGH (ref 70–99)
Potassium: 3.4 mmol/L — ABNORMAL LOW (ref 3.5–5.1)
Sodium: 137 mmol/L (ref 135–145)

## 2024-04-10 LAB — TROPONIN I (HIGH SENSITIVITY)
Troponin I (High Sensitivity): 25 ng/L — ABNORMAL HIGH (ref <18)
Troponin I (High Sensitivity): 25 ng/L — ABNORMAL HIGH (ref <18)

## 2024-04-10 LAB — LIPASE, BLOOD: Lipase: 31 U/L (ref 11–51)

## 2024-04-10 LAB — D-DIMER, QUANTITATIVE: D-Dimer, Quant: 0.35 ug{FEU}/mL (ref 0.00–0.50)

## 2024-04-10 MED ORDER — SODIUM CHLORIDE 0.9 % IV BOLUS
1000.0000 mL | Freq: Once | INTRAVENOUS | Status: AC
  Administered 2024-04-10: 1000 mL via INTRAVENOUS

## 2024-04-10 NOTE — ED Triage Notes (Signed)
 Pt reports intermittent chest tightness that began today.  Had eval for shob last week he reports but did not follow up yet. Pt went to his doctor's office today when he began having CP and they were closed.

## 2024-04-10 NOTE — ED Provider Notes (Signed)
 [facility] Provider Note    Event Date/Time   First MD Initiated Contact with Patient 04/10/24 2039     (approximate)   History   Chest Pain   HPI  Ronald Horne is a 73 year old male with history of COPD, cardiomyopathy, HTN, atrial tachycardia on Eliquis  presenting to the emergency department for evaluation of chest pain.  Patient reports that earlier today he had some tightness in his chest.  Started around 4 PM.  Intermittent since that time.  Currently chest pain-free.  Not specifically pleuritic or exertional.  Says he has had similar symptoms before that he associates with heartburn.  Has a history of COPD with prior exacerbations, currently denies any shortness of breath or wheezing.  No fevers, recent trauma or heavy lifting.     Physical Exam   Triage Vital Signs: ED Triage Vitals  Encounter Vitals Group     BP 04/10/24 2033 (!) 84/62     Systolic BP Percentile --      Diastolic BP Percentile --      Pulse Rate 04/10/24 2033 (!) 102     Resp 04/10/24 2033 (!) 22     Temp 04/10/24 2033 97.7 F (36.5 C)     Temp Source 04/10/24 2033 Oral     SpO2 04/10/24 2033 97 %     Weight --      Height --      Head Circumference --      Peak Flow --      Pain Score 04/10/24 2030 7     Pain Loc --      Pain Education --      Exclude from Growth Chart --     Most recent vital signs: Vitals:   04/10/24 2245 04/10/24 2330  BP: (!) 86/65 101/77  Pulse: 95 98  Resp: 20 17  Temp:    SpO2: 93% 96%     General: Awake, interactive  CV:  Regular rate, good peripheral perfusion.  Resp:  Unlabored respirations, lungs clear to auscultation Chest wall: Nontender to palpation Abd:  Nondistended Neuro:  Symmetric facial movement, fluid speech   ED Results / Procedures / Treatments   Labs (all labs ordered are listed, but only abnormal results are displayed) Labs Reviewed  BASIC METABOLIC PANEL WITH GFR - Abnormal; Notable for the following  components:      Result Value   Potassium 3.4 (*)    Glucose, Bld 115 (*)    BUN 31 (*)    Creatinine, Ser 1.29 (*)    Calcium  8.1 (*)    GFR, Estimated 59 (*)    All other components within normal limits  CBC - Abnormal; Notable for the following components:   WBC 12.6 (*)    All other components within normal limits  HEPATIC FUNCTION PANEL - Abnormal; Notable for the following components:   Albumin 3.2 (*)    All other components within normal limits  TROPONIN I (HIGH SENSITIVITY) - Abnormal; Notable for the following components:   Troponin I (High Sensitivity) 25 (*)    All other components within normal limits  TROPONIN I (HIGH SENSITIVITY) - Abnormal; Notable for the following components:   Troponin I (High Sensitivity) 25 (*)    All other components within normal limits  LIPASE, BLOOD  D-DIMER, QUANTITATIVE     EKG EKG independently reviewed and interpreted by myself demonstrates:  EKG demonstrates sinus tachycardia at a rate of 103, PR 180, QRS 146, QTc 510,  no acute ST changes  RADIOLOGY Imaging independently reviewed and interpreted by myself demonstrates:  CXR without focal consolidation  Formal Radiology Read:  DG Chest 2 View Result Date: 04/10/2024 CLINICAL DATA:  Chest pain EXAM: CHEST - 2 VIEW COMPARISON:  04/04/2024 FINDINGS: The heart size and mediastinal contours are within normal limits. Both lungs are clear. The visualized skeletal structures are unremarkable. IMPRESSION: No active cardiopulmonary disease. Electronically Signed   By: Violeta Grey M.D.   On: 04/10/2024 21:07    PROCEDURES:  Critical Care performed: No  Procedures   MEDICATIONS ORDERED IN ED: Medications  sodium chloride  0.9 % bolus 1,000 mL (1,000 mLs Intravenous New Bag/Given 04/10/24 2138)     IMPRESSION / MDM / ASSESSMENT AND PLAN / ED COURSE  I reviewed the triage vital signs and the nursing notes.  Differential diagnosis includes, but is not limited to, ACS, pneumonia,  pneumothorax, PE, musculoskeletal strain, GI related chest pain  Patient's presentation is most consistent with acute presentation with potential threat to life or bodily function.  73 year old male presenting to the emergency department for evaluation of chest pain.  Mild tachycardia and soft blood pressure on presentation.  On review of patient's recent vitals from outside visits, does have low normal blood pressure.   Labs with mild leukocytosis, but no reported infectious symptoms.  BMP with mild AKI with creatinine of 1.29.  Consideration for possible mild volume depletion, ordered for fluids with slight improvement in blood pressure.  Reassuring LFTs and lipase.  Negative D-dimer.  Initial troponin 25, history of mildly elevated troponins multiple times in the past.  Repeat stable.  Patient reassessed.  Remains chest pain-free.  Discussed results of workup.  I did consider admission, but patient prefers discharge home which I do think is reasonable.  He will follow-up with cardiology.  Strict return precautions provided.  Patient discharged stable condition.       FINAL CLINICAL IMPRESSION(S) / ED DIAGNOSES   Final diagnoses:  Nonspecific chest pain     Rx / DC Orders   ED Discharge Orders          Ordered    Ambulatory referral to Cardiology       Comments: If you have not heard from the Cardiology office within the next 72 hours please call [phone].   04/10/24 2349             Note:  This document was prepared using Dragon voice recognition software and may include unintentional dictation errors.   Claria Crofts, MD 04/10/24 2350

## 2024-04-10 NOTE — Discharge Instructions (Addendum)
 You were seen in the Emergency Department today for evaluation of your chest pain. Fortunately, your labs, EKG, and chest x-Ronald Horne were overall reassuring against a emergency cause for your pain. I have placed a referral to cardiology for further evaluation of your chest pain.  Return to the ER for any new or worsening symptoms including worsening chest pain, difficulty breathing, or any other new or concerning symptoms that you believe warrants immediate attention.

## 2024-04-10 NOTE — ED Notes (Signed)
Blue top in lab 

## 2024-04-16 ENCOUNTER — Ambulatory Visit (INDEPENDENT_AMBULATORY_CARE_PROVIDER_SITE_OTHER): Admitting: Cardiovascular Disease

## 2024-04-16 ENCOUNTER — Encounter: Payer: Self-pay | Admitting: Cardiovascular Disease

## 2024-04-16 VITALS — BP 92/72 | HR 102 | Ht 74.0 in | Wt 302.6 lb

## 2024-04-16 DIAGNOSIS — I10 Essential (primary) hypertension: Secondary | ICD-10-CM | POA: Diagnosis not present

## 2024-04-16 DIAGNOSIS — I739 Peripheral vascular disease, unspecified: Secondary | ICD-10-CM

## 2024-04-16 DIAGNOSIS — I480 Paroxysmal atrial fibrillation: Secondary | ICD-10-CM | POA: Diagnosis not present

## 2024-04-16 DIAGNOSIS — I519 Heart disease, unspecified: Secondary | ICD-10-CM | POA: Diagnosis not present

## 2024-04-16 DIAGNOSIS — R0602 Shortness of breath: Secondary | ICD-10-CM

## 2024-04-16 NOTE — Progress Notes (Signed)
 Cardiology Office Note   Date:  04/16/2024   ID:  Ahmet Schank, DOB 1951/04/03, MRN [id number]  PCP:  Annelle Kiel, MD  Cardiologist:  Debborah Fairly, MD      History of Present Illness: Ronald Horne is a 73 y.o. male who presents for  Chief Complaint  Patient presents with   Follow-up    4 week follow up    Feels less SOB. Breathing better.      Past Medical History:  Diagnosis Date   Arthritis    Cardiomyopathy (HCC)    a. 08/2016 Echo: EF 30-35%, diff HK; b. 03/2018 Ex MV (Jadali): Ex time 2:00, large anter defect, EF 24%; c. 03/2018 Echo: EF 20-25%, diff HK. Gr1 DD; d. Refused cath.   Chronic back pain    Chronic combined systolic (congestive) and diastolic (congestive) heart failure (HCC)    a. 08/2016 Echo: EF 30-35%, diff HK, mild to mod MR, mild BAE; b. 03/2018 Echo: EF 20-25%, diff HK. Gr1 DD. MIldly dil Ao root. Mild BAE. Mildly reduced RV fxn.   GERD (gastroesophageal reflux disease)    Gout    Hypertension    Obesity      Past Surgical History:  Procedure Laterality Date   EPIDURAL BLOCK INJECTION     chronic back pain    LEFT HEART CATH AND CORONARY ANGIOGRAPHY Left 12/27/2019   Procedure: LEFT HEART CATH AND CORONARY ANGIOGRAPHY;  Surgeon: Cherrie Cornwall, MD;  Location: [facility] INVASIVE CV LAB;  Service: Cardiovascular;  Laterality: Left;   TONSILLECTOMY       Current Outpatient Medications  Medication Sig Dispense Refill   acetaminophen  (TYLENOL ) 500 MG tablet Take 500-1,000 mg by mouth every 6 (six) hours as needed (pain.).     albuterol  (VENTOLIN  HFA) 108 (90 Base) MCG/ACT inhaler Inhale 1-2 puffs into the lungs every 6 (six) hours as needed for shortness of breath or wheezing.     amiodarone  (PACERONE ) 200 MG tablet Take 4 tablets/day for 1 week, then 3 tablets/day for 1 week, then 2 tablets/day for 1 week. Then follow up. 70 tablet 0   apixaban  (ELIQUIS ) 5 MG TABS tablet Take 1 tablet (5 mg total) by mouth 2 (two) times daily. 60 tablet 2    APPLE CIDER VINEGAR PO Take 15 mLs by mouth in the morning and at bedtime.     atorvastatin  (LIPITOR) 20 MG tablet Take 20 mg by mouth in the morning.     digoxin  (LANOXIN ) 0.25 MG tablet Take 1 tablet (250 mcg total) by mouth daily. 30 tablet 11   Ensifentrine  (OHTUVAYRE ) 3 MG/2.5ML SUSP Inhale 2.5 mLs into the lungs in the morning and at bedtime.     ENTRESTO  97-103 MG TAKE 1 TABLET BY MOUTH TWICE A DAY 60 tablet 6   FARXIGA  10 MG TABS tablet TAKE 1 TABLET BY MOUTH DAILY 30 tablet 6   furosemide  (LASIX ) 40 MG tablet Take 40 mg by mouth in the morning.     methimazole  (TAPAZOLE ) 5 MG tablet Take 5 mg by mouth in the morning.     metoprolol  succinate (TOPROL  XL) 100 MG 24 hr tablet Take 1 tablet (100 mg total) by mouth daily. Take with or immediately following a meal. 30 tablet 11   Multiple Vitamin (MULTIVITAMIN WITH MINERALS) TABS tablet Take 1 tablet by mouth daily.     OVER THE COUNTER MEDICATION Take 1 Dose by mouth daily. [employer]     predniSONE  (DELTASONE ) 20 MG tablet Take 3  tablets (60 mg total) by mouth daily. 15 tablet 0   spironolactone  (ALDACTONE ) 25 MG tablet TAKE 1/2 TABLET BY MOUTH DAILY 15 tablet 6   torsemide  (DEMADEX ) 10 MG tablet Take 1 tablet (10 mg total) by mouth 2 (two) times daily. 60 tablet 11   gabapentin (NEURONTIN) 300 MG capsule Take 300 mg by mouth at bedtime. (Patient not taking: Reported on 04/16/2024)     No current facility-administered medications for this visit.    Allergies:   Patient has no known allergies.    Social History:   reports that he quit smoking about 47 years ago. His smoking use included cigarettes. He started smoking about 48 years ago. He has a 3 pack-year smoking history. He has never used smokeless tobacco. He reports that he does not drink alcohol and does not use drugs.   Family History:  family history is not on file.    ROS:     Review of Systems  Constitutional: Negative.   HENT: Negative.    Eyes: Negative.   Respiratory:  Negative.    Gastrointestinal: Negative.   Genitourinary: Negative.   Musculoskeletal: Negative.   Skin: Negative.   Neurological: Negative.   Endo/Heme/Allergies: Negative.   Psychiatric/Behavioral: Negative.    All other systems reviewed and are negative.     All other systems are reviewed and negative.    PHYSICAL EXAM: VS:  BP 92/72   Pulse (!) 102   Ht 6' 2 (1.88 m)   Wt (!) 302 lb 9.6 oz (137.3 kg)   SpO2 97%   BMI 38.85 kg/m  , BMI Body mass index is 38.85 kg/m. Last weight:  Wt Readings from Last 3 Encounters:  04/16/24 (!) 302 lb 9.6 oz (137.3 kg)  04/04/24 (!) 302 lb (137 kg)  03/25/24 300 lb (136.1 kg)     Physical Exam Vitals reviewed.  Constitutional:      Appearance: Normal appearance. He is normal weight.  HENT:     Head: Normocephalic.     Nose: Nose normal.     Mouth/Throat:     Mouth: Mucous membranes are moist.   Eyes:     Pupils: Pupils are equal, round, and reactive to light.    Cardiovascular:     Rate and Rhythm: Normal rate and regular rhythm.     Pulses: Normal pulses.     Heart sounds: Normal heart sounds.  Pulmonary:     Effort: Pulmonary effort is normal.  Abdominal:     General: Abdomen is flat. Bowel sounds are normal.   Musculoskeletal:        General: Normal range of motion.     Cervical back: Normal range of motion.   Skin:    General: Skin is warm.   Neurological:     General: No focal deficit present.     Mental Status: He is alert.   Psychiatric:        Mood and Affect: Mood normal.       EKG:   Recent Labs: 05/19/2023: Magnesium 2.3 04/04/2024: B Natriuretic Peptide 585.0 04/10/2024: ALT 19; BUN 31; Creatinine, Ser 1.29; Hemoglobin 15.4; Platelets 259; Potassium 3.4; Sodium 137    Lipid Panel No results found for: CHOL, TRIG, HDL, CHOLHDL, VLDL, LDLCALC, LDLDIRECT    Other studies Reviewed: Additional studies/ records that were reviewed today include:  Review of the above records  demonstrates:      04/12/2018   11:13 AM  PAD Screen  Previous PAD dx? No  Previous surgical procedure? No  Pain with walking? No  Feet/toe relief with dangling? No  Painful, non-healing ulcers? No  Extremities discolored? No      ASSESSMENT AND PLAN:    ICD-10-CM   1. SOB (shortness of breath)  R06.02    Last 6 days less SOB. Continue GDMT for CHF.    2. Cardiopathy  I51.9     3. Paroxysmal atrial fibrillation (HCC)  I48.0    last ekg sinus tachycardia 112/min    4. PAD (peripheral artery disease) (HCC)  I73.9     5. Primary hypertension  I10        Problem List Items Addressed This Visit       Cardiovascular and Mediastinum   Hypertension   PAD (peripheral artery disease) (HCC)   Cardiopathy     Other   SOB (shortness of breath) - Primary   Other Visit Diagnoses       Paroxysmal atrial fibrillation (HCC)       last ekg sinus tachycardia 112/min          Disposition:   Return in about 2 months (around 06/16/2024).    Total time spent: 30 minutes  Signed,  Debborah Fairly, MD  04/16/2024 10:39 AM    [facility]

## 2024-05-10 ENCOUNTER — Other Ambulatory Visit: Payer: Self-pay | Admitting: Cardiovascular Disease

## 2024-05-10 ENCOUNTER — Telehealth: Payer: Self-pay

## 2024-05-10 DIAGNOSIS — G4733 Obstructive sleep apnea (adult) (pediatric): Secondary | ICD-10-CM

## 2024-05-10 DIAGNOSIS — I480 Paroxysmal atrial fibrillation: Secondary | ICD-10-CM

## 2024-05-10 DIAGNOSIS — I519 Heart disease, unspecified: Secondary | ICD-10-CM

## 2024-05-10 DIAGNOSIS — I200 Unstable angina: Secondary | ICD-10-CM

## 2024-05-10 DIAGNOSIS — R0789 Other chest pain: Secondary | ICD-10-CM

## 2024-05-10 DIAGNOSIS — R0602 Shortness of breath: Secondary | ICD-10-CM

## 2024-05-10 DIAGNOSIS — I5021 Acute systolic (congestive) heart failure: Secondary | ICD-10-CM

## 2024-05-10 DIAGNOSIS — R000 Tachycardia, unspecified: Secondary | ICD-10-CM

## 2024-05-10 NOTE — Telephone Encounter (Signed)
 [employer] nurse called stating that the pt is confused on his medication regimen as there has been many changes to his med list in the last couple of months. Requesting a complete med review before his next appointment also requesting for education on his conditions and medications. Left vm to call back to get this scheduled.

## 2024-05-20 ENCOUNTER — Other Ambulatory Visit: Payer: Self-pay

## 2024-05-20 ENCOUNTER — Emergency Department

## 2024-05-20 ENCOUNTER — Emergency Department: Admission: EM | Admit: 2024-05-20 | Discharge: 2024-05-20 | Discharge status: Against Medical Advice

## 2024-05-20 DIAGNOSIS — I509 Heart failure, unspecified: Secondary | ICD-10-CM | POA: Diagnosis not present

## 2024-05-20 DIAGNOSIS — R0602 Shortness of breath: Secondary | ICD-10-CM | POA: Diagnosis present

## 2024-05-20 DIAGNOSIS — Z5321 Procedure and treatment not carried out due to patient leaving prior to being seen by health care provider: Secondary | ICD-10-CM | POA: Insufficient documentation

## 2024-05-20 HISTORY — DX: Sleep apnea, unspecified: G47.30

## 2024-05-20 HISTORY — DX: Chronic obstructive pulmonary disease, unspecified: J44.9

## 2024-05-20 LAB — CBC
HCT: 44.8 % (ref 39.0–52.0)
Hemoglobin: 15.1 g/dL (ref 13.0–17.0)
MCH: 30.1 pg (ref 26.0–34.0)
MCHC: 33.7 g/dL (ref 30.0–36.0)
MCV: 89.2 fL (ref 80.0–100.0)
Platelets: 185 K/uL (ref 150–400)
RBC: 5.02 MIL/uL (ref 4.22–5.81)
RDW: 14.2 % (ref 11.5–15.5)
WBC: 8.6 K/uL (ref 4.0–10.5)
nRBC: 0 % (ref 0.0–0.2)

## 2024-05-20 LAB — BASIC METABOLIC PANEL WITH GFR
Anion gap: 10 (ref 5–15)
BUN: 14 mg/dL (ref 8–23)
CO2: 27 mmol/L (ref 22–32)
Calcium: 8.2 mg/dL — ABNORMAL LOW (ref 8.9–10.3)
Chloride: 104 mmol/L (ref 98–111)
Creatinine, Ser: 1.14 mg/dL (ref 0.61–1.24)
GFR, Estimated: >60 mL/min (ref >60)
Glucose, Bld: 135 mg/dL — ABNORMAL HIGH (ref 70–99)
Potassium: 3 mmol/L — ABNORMAL LOW (ref 3.5–5.1)
Sodium: 141 mmol/L (ref 135–145)

## 2024-05-20 LAB — BRAIN NATRIURETIC PEPTIDE: B Natriuretic Peptide: 860.5 pg/mL — ABNORMAL HIGH (ref 0.0–100.0)

## 2024-05-20 NOTE — ED Triage Notes (Signed)
 Pt to ED for SOB, hx CHF and takes Entresto  and loop diuretics. Has been SOB again today but it has been ongoing and intermittent. Pt cannot lay flat. Legs are not swollen. Denies CP, dizziness. Pt is speaking in full sentences. No accessory muscle use noted.

## 2024-05-21 ENCOUNTER — Telehealth: Payer: Self-pay | Admitting: Emergency Medicine

## 2024-05-21 NOTE — Telephone Encounter (Signed)
 Called patient due to left emergency department before provider exam to inquire about condition and follow up plans. Left message.

## 2024-05-22 ENCOUNTER — Emergency Department

## 2024-05-22 ENCOUNTER — Other Ambulatory Visit: Payer: Self-pay

## 2024-05-22 ENCOUNTER — Emergency Department
Admission: EM | Admit: 2024-05-22 | Discharge: 2024-05-22 | Disposition: A | Discharge status: Home / Self Care | Attending: Emergency Medicine | Admitting: Emergency Medicine

## 2024-05-22 DIAGNOSIS — R0789 Other chest pain: Secondary | ICD-10-CM | POA: Insufficient documentation

## 2024-05-22 DIAGNOSIS — Z7901 Long term (current) use of anticoagulants: Secondary | ICD-10-CM | POA: Insufficient documentation

## 2024-05-22 DIAGNOSIS — Z5329 Procedure and treatment not carried out because of patient's decision for other reasons: Secondary | ICD-10-CM | POA: Insufficient documentation

## 2024-05-22 DIAGNOSIS — R000 Tachycardia, unspecified: Secondary | ICD-10-CM

## 2024-05-22 DIAGNOSIS — E876 Hypokalemia: Secondary | ICD-10-CM | POA: Insufficient documentation

## 2024-05-22 DIAGNOSIS — R1032 Left lower quadrant pain: Secondary | ICD-10-CM | POA: Insufficient documentation

## 2024-05-22 DIAGNOSIS — J449 Chronic obstructive pulmonary disease, unspecified: Secondary | ICD-10-CM | POA: Insufficient documentation

## 2024-05-22 DIAGNOSIS — R0902 Hypoxemia: Secondary | ICD-10-CM | POA: Insufficient documentation

## 2024-05-22 DIAGNOSIS — R0602 Shortness of breath: Secondary | ICD-10-CM | POA: Insufficient documentation

## 2024-05-22 DIAGNOSIS — R944 Abnormal results of kidney function studies: Secondary | ICD-10-CM | POA: Insufficient documentation

## 2024-05-22 DIAGNOSIS — I509 Heart failure, unspecified: Secondary | ICD-10-CM | POA: Insufficient documentation

## 2024-05-22 DIAGNOSIS — I10 Essential (primary) hypertension: Secondary | ICD-10-CM | POA: Insufficient documentation

## 2024-05-22 DIAGNOSIS — I110 Hypertensive heart disease with heart failure: Secondary | ICD-10-CM | POA: Diagnosis not present

## 2024-05-22 LAB — CBC
HCT: 42.4 % (ref 39.0–52.0)
Hemoglobin: 14.3 g/dL (ref 13.0–17.0)
MCH: 30.7 pg (ref 26.0–34.0)
MCHC: 33.7 g/dL (ref 30.0–36.0)
MCV: 91 fL (ref 80.0–100.0)
Platelets: 158 K/uL (ref 150–400)
RBC: 4.66 MIL/uL (ref 4.22–5.81)
RDW: 14.2 % (ref 11.5–15.5)
WBC: 6.2 K/uL (ref 4.0–10.5)
nRBC: 0 % (ref 0.0–0.2)

## 2024-05-22 LAB — BRAIN NATRIURETIC PEPTIDE
B Natriuretic Peptide: 678 pg/mL — ABNORMAL HIGH (ref 0.0–100.0)
B Natriuretic Peptide: 950.7 pg/mL — ABNORMAL HIGH (ref 0.0–100.0)

## 2024-05-22 LAB — BASIC METABOLIC PANEL WITH GFR
Anion gap: 11 (ref 5–15)
Anion gap: 12 (ref 5–15)
BUN: 15 mg/dL (ref 8–23)
BUN: 17 mg/dL (ref 8–23)
CO2: 25 mmol/L (ref 22–32)
CO2: 22 mmol/L (ref 22–32)
Calcium: 8 mg/dL — ABNORMAL LOW (ref 8.9–10.3)
Calcium: 8.2 mg/dL — ABNORMAL LOW (ref 8.9–10.3)
Chloride: 104 mmol/L (ref 98–111)
Chloride: 107 mmol/L (ref 98–111)
Creatinine, Ser: 1.1 mg/dL (ref 0.61–1.24)
Creatinine, Ser: 1.4 mg/dL — ABNORMAL HIGH (ref 0.61–1.24)
GFR, Estimated: >60 mL/min (ref >60)
GFR, Estimated: 54 mL/min — ABNORMAL LOW (ref >60)
Glucose, Bld: 148 mg/dL — ABNORMAL HIGH (ref 70–99)
Glucose, Bld: 126 mg/dL — ABNORMAL HIGH (ref 70–99)
Potassium: 3 mmol/L — ABNORMAL LOW (ref 3.5–5.1)
Potassium: 3.2 mmol/L — ABNORMAL LOW (ref 3.5–5.1)
Sodium: 140 mmol/L (ref 135–145)
Sodium: 141 mmol/L (ref 135–145)

## 2024-05-22 LAB — CBC WITH DIFFERENTIAL/PLATELET
Abs Immature Granulocytes: 0.02 K/uL (ref 0.00–0.07)
Basophils Absolute: 0 K/uL (ref 0.0–0.1)
Basophils Relative: 0 %
Eosinophils Absolute: 0.1 K/uL (ref 0.0–0.5)
Eosinophils Relative: 1 %
HCT: 47.4 % (ref 39.0–52.0)
Hemoglobin: 15.3 g/dL (ref 13.0–17.0)
Immature Granulocytes: 0 %
Lymphocytes Relative: 32 %
Lymphs Abs: 2.9 K/uL (ref 0.7–4.0)
MCH: 29.6 pg (ref 26.0–34.0)
MCHC: 32.3 g/dL (ref 30.0–36.0)
MCV: 91.7 fL (ref 80.0–100.0)
Monocytes Absolute: 0.8 K/uL (ref 0.1–1.0)
Monocytes Relative: 9 %
Neutro Abs: 5.2 K/uL (ref 1.7–7.7)
Neutrophils Relative %: 58 %
Platelets: 189 K/uL (ref 150–400)
RBC: 5.17 MIL/uL (ref 4.22–5.81)
RDW: 14.2 % (ref 11.5–15.5)
WBC: 8.9 K/uL (ref 4.0–10.5)
nRBC: 0 % (ref 0.0–0.2)

## 2024-05-22 LAB — TROPONIN I (HIGH SENSITIVITY)
Troponin I (High Sensitivity): 30 ng/L — ABNORMAL HIGH (ref <18)
Troponin I (High Sensitivity): 32 ng/L — ABNORMAL HIGH (ref <18)
Troponin I (High Sensitivity): 31 ng/L — ABNORMAL HIGH (ref <18)

## 2024-05-22 LAB — DIGOXIN LEVEL: Digoxin Level: <0.2 ng/mL — ABNORMAL LOW (ref 0.8–2.0)

## 2024-05-22 LAB — D-DIMER, QUANTITATIVE: D-Dimer, Quant: 0.32 ug{FEU}/mL (ref 0.00–0.50)

## 2024-05-22 MED ORDER — METOPROLOL SUCCINATE ER 50 MG PO TB24
100.0000 mg | ORAL_TABLET | Freq: Once | ORAL | Status: AC
  Administered 2024-05-22: 100 mg via ORAL
  Filled 2024-05-22: qty 2

## 2024-05-22 MED ORDER — POTASSIUM CHLORIDE 20 MEQ PO PACK
40.0000 meq | PACK | Freq: Every day | ORAL | Status: DC
  Administered 2024-05-22: 40 meq via ORAL
  Filled 2024-05-22: qty 2

## 2024-05-22 NOTE — ED Notes (Signed)
 Patient was ambulatory to toilet.

## 2024-05-22 NOTE — Discharge Instructions (Addendum)
 There is no signs of heart attack or blood clots.   However you have signs of extra fluid on your lungs.  You should take your dose of torsemide  when you return home. You need to take all your medications to your cardiologist to make sure you are taking all the correct medications. Hold your potassium and your metoprolol  today as we have given you a dose this morning.  If you develop worsening shortness of breath he should return to the ER for repeat evaluation or any other concers

## 2024-05-22 NOTE — Progress Notes (Signed)
 Heart Failure Navigator Progress Note  Assessed for Heart & Vascular TOC clinic readiness. Patient was already discharged but does not meet criteria due to current patient of Dr. Fernand with follow-up scheduled for 06/17/24 and patient also to follow-up with Internal medicine in 1 day with Dr. Fayegh Jadali.   Navigator will sign of at this time.  Charmaine Pines, RN, BSN [facility] Heart Failure Navigator Secure Chat Only

## 2024-05-22 NOTE — ED Provider Notes (Addendum)
 [facility] Provider Note    Event Date/Time   First MD Initiated Contact with Patient 05/22/24 [phone]     (approximate)   History   Chest Pain   HPI  Ronald Horne is a 73 y.o. male who comes in with left-sided chest tightness.  Patient reports that started last night after the antibiotics that he was prescribed yesterday from urgent care.  He had the pain initially went away but then came back this morning.  Patient has a history of COPD, cardiomyopathy, hypertension, atrial tachycardia on Eliquis .  I reviewed a note from 7/21 where patient was seen for shortness of breath and had a BNP that was elevated chest x-ray that showed some edema.  However it looks like he left without being seen due to the long wait.  I also reviewed a note where patient was seen on 02/19/2024 and was planned to have catheterization done with Dr. Deretha but due to delay with having the cath done patient left AGAINST MEDICAL ADVICE.  He followed up in the office since then has been started on dig, amiodarone .  Patient does not have his medications all here with him today so he is not exactly sure what he is still taking.  On review of notes his heart rates typically run in the 100s but he states that he is not taking any of his heart medications yet this morning.  He denies any symptoms at this time.  Patient reports that his primary care doctor prescribed him both potassium, Augmentin.  He states the Augmentin was prescribed secondary to trying to just prevent any infection in his lungs or in his urine.  He denied any infectious lung symptoms.  When I reviewed the note from the primary care doctor and it appears that may be because he was having some left lower quadrant pain they had put him on it for possible diverticulitis?  Although this is not explicitly explained in the note.  Patient reports that he took a dose of the Augmentin around noon and felt a little weird after taking it.  He  states that then he took another dose at midnight.  He reports waking up at 6 AM with some chest tightness, shortness of breath.  He reports that his symptoms have since resolved and he denies any abdominal pain fevers or other concerns.  Denies feeling sick.  States that he feels in his normal self now.    Physical Exam   Triage Vital Signs: ED Triage Vitals  Encounter Vitals Group     BP 05/22/24 0624 (!) 127/90     Girls Systolic BP Percentile --      Girls Diastolic BP Percentile --      Boys Systolic BP Percentile --      Boys Diastolic BP Percentile --      Pulse Rate 05/22/24 0624 (!) 114     Resp 05/22/24 0624 (!) 30     Temp 05/22/24 0624 (!) 97.5 F (36.4 C)     Temp Source 05/22/24 0624 Oral     SpO2 05/22/24 0624 97 %     Weight 05/22/24 0652 300 lb (136.1 kg)     Height 05/22/24 0652 6' 2 (1.88 m)     Head Circumference --      Peak Flow --      Pain Score 05/22/24 0621 5     Pain Loc --      Pain Education --  Exclude from Growth Chart --     Most recent vital signs: Vitals:   05/22/24 0624  BP: (!) 127/90  Pulse: (!) 114  Resp: (!) 30  Temp: (!) 97.5 F (36.4 C)  SpO2: 97%     General: Awake, no distress.  CV:  Good peripheral perfusion.  Tachycardic Resp:  Normal effort.  Clear lung Abd:  No distention.  Soft and nontender Other:  No swelling in legs no calf tenderness   ED Results / Procedures / Treatments   Labs (all labs ordered are listed, but only abnormal results are displayed) Labs Reviewed  BASIC METABOLIC PANEL WITH GFR  CBC  D-DIMER, QUANTITATIVE  BRAIN NATRIURETIC PEPTIDE  TROPONIN I (HIGH SENSITIVITY)     EKG  My interpretation of EKG:  Sinus rate of 107 without any ST elevation or T wave inversions but does have widened QRS that looks like a left bundle branch block.  It looks similar to his EKG from yesterday as well as from July 2021  Repeat EKG states is atrial flutter but I think I do see a P wave especially in  leads V3 and V2 still looks more like sinus with type I AV block although patient does have a history of A-fib, a flutter so is definitely possible.  However heart rates are around 100 so it is currently rate controlled.  Similar left bundle branch block.  RADIOLOGY I have reviewed the xray personally and interpreted and patient has pulmonary vascular congestion   PROCEDURES:  Critical Care performed: No  .1-3 Lead EKG Interpretation  Performed by: Ernest Ronal BRAVO, MD Authorized by: Ernest Ronal BRAVO, MD     Interpretation: abnormal     ECG rate:  100   ECG rate assessment: tachycardic     Rhythm: sinus tachycardia     Ectopy: none     Conduction: normal      MEDICATIONS ORDERED IN ED: Medications  potassium chloride  (KLOR-CON ) packet 40 mEq (40 mEq Oral Given 05/22/24 0821)  metoprolol  succinate (TOPROL -XL) 24 hr tablet 100 mg (100 mg Oral Given 05/22/24 0820)     IMPRESSION / MDM / ASSESSMENT AND PLAN / ED COURSE  I reviewed the triage vital signs and the nursing notes.   Patient's presentation is most consistent with acute presentation with potential threat to life or bodily function.   Patient comes in with tachycardia with chest tightness, shortness of breath.  Workup ordered from triage to evaluate for ACS, CHF, PE.  It does appear that patient is supposed be on Eliquis  but I am not sure if patient is actually taking it as he states that he was told to hold it for the catheterization he is not sure if he restarted that yet.  He however denies any symptoms at this time and thinks this is all related to the Augmentin.  He denies any abdominal pain to suggest abdominal perforation.  It sounds like when he came in yesterday he has been on oxycodone he was complaining of some abdominal pain and constipation but given he had reported some left lower quadrant pressure with laying they put him on antibiotic.  Not sure if this is clinically indicated and patient is concerned he is having a  reaction to it.  Thyroid testing in the past has been normal.  BNP is elevated but downtrending from 2 days ago.  BMP shows potassium at 3 but he does report that he was recently started on some potassium pills.  CBC reassuring.  Troponin is elevated but similar to prior.  Discussed with patient giving him some metoprolol  given his heart rate is elevated but patient declined.  He states that he does not want to take additional blood pressure medications as he wants to take all of his medications when he gets home as they are in a pillbox.  I see in the past where he is common with elevated heart rates over 100 115 previously and he declines medications at this time for it and states he has no symptoms.   Reevaluated patient again discussed his tachycardia.  We discussed that he had not taken his metoprolol  and he states that he was told that metoprolol  can cause your arteries to clog.  Explained to him the importance of the metoprolol  given his tachycardia.  I did recommend IV Lasix  for patient given his chest x-ray showing edema but patient declined stating that he does not have any shortness of breath right now he does not want to have to urinate multiple times.  Patient has a lot of confusion on the medications that he is taking and when he actually does have a follow-up with cardiology on Friday and have explained the importance of taking all of his medications there to ensure that he is taking all the correct medications.  When I discussed ordering him a dose of potassium he states that he is already on potassium medications and I stated that he can start that again tomorrow.  He understands to hold his metoprolol  today.  When I talk about getting a second troponin he states that he is worried about having to stay longer for that but after further discussion of the importance of the troponin patient states that he is willing to stay.  Try to discuss following up with the heart failure clinic but patient  declines that he has been seen there before and they do not understand him and his medications and that is just having to talk to another provider.  He also declines getting any IV Lasix  from anywhere else as he states that he wants to be at his house when he has to urinate.  Reevaluated patient updated on negative D-dimer troponins are flat unlikely ACS.  We discussed discontinuing the Augmentin given no clear indication for starting it in case this could be contributing.  He reports just having constipation that was causing some abdominal discomfort.  He denies any pain at this time we discussed return precautions in regards to this.  He has follow-up with his PCP tomorrow and his cardiologist on Friday we discussed admission to the hospital for diuresis but patient denies shortness of breath at this time and declined IV Lasix  and preferred to take his torsemide  at home and will follow-up closely and return to the ER if he develops worsening shortness of breath or any other concerns   The patient is on the cardiac monitor to evaluate for evidence of arrhythmia and/or significant heart rate changes.      FINAL CLINICAL IMPRESSION(S) / ED DIAGNOSES   Final diagnoses:  Acute on chronic congestive heart failure, unspecified heart failure type (HCC)  Tachycardia     Rx / DC Orders   ED Discharge Orders     None        Note:  This document was prepared using Dragon voice recognition software and may include unintentional dictation errors.   Ernest Ronal BRAVO, MD 05/22/24 0930    Ernest Ronal BRAVO, MD 05/22/24 [phone]

## 2024-05-22 NOTE — ED Triage Notes (Addendum)
 Pt to ED via POV c/o left sided chest tightness. Started last night after taking abx that he was prescribed yesterday from UC. Pain went away but came back this morning. Pt endorses SOB. Denies N/V/D, dizziness, fevers.

## 2024-05-22 NOTE — ED Triage Notes (Signed)
 Pt reports he has been Sob for the past few weeks at night time, pt states he has been seen here over the past few days for various things. Pt states the heat makes the SOB worse.

## 2024-05-22 NOTE — ED Notes (Signed)
 Called Lab to ask about patient's D-dimer that was collected at 0645 and updated at 0705. Lab transferring RN to different area's in department to figure out if lab resulted.

## 2024-05-22 NOTE — ED Notes (Signed)
 RN went into room to place patient back on cardiac monitor. Patient sitting in chair speaking with other team member.

## 2024-05-23 ENCOUNTER — Emergency Department

## 2024-05-23 ENCOUNTER — Emergency Department
Admission: EM | Admit: 2024-05-23 | Discharge: 2024-05-23 | Discharge status: Against Medical Advice | Source: Home / Self Care | Attending: Emergency Medicine | Admitting: Emergency Medicine

## 2024-05-23 DIAGNOSIS — R0902 Hypoxemia: Secondary | ICD-10-CM

## 2024-05-23 DIAGNOSIS — R0602 Shortness of breath: Secondary | ICD-10-CM

## 2024-05-23 DIAGNOSIS — I110 Hypertensive heart disease with heart failure: Secondary | ICD-10-CM | POA: Diagnosis not present

## 2024-05-23 LAB — BLOOD GAS, VENOUS
Acid-Base Excess: 2.5 mmol/L — ABNORMAL HIGH (ref 0.0–2.0)
Bicarbonate: 26.3 mmol/L (ref 20.0–28.0)
O2 Saturation: 96.4 %
Patient temperature: 37
pCO2, Ven: 37 mmHg — ABNORMAL LOW (ref 44–60)
pH, Ven: 7.46 — ABNORMAL HIGH (ref 7.25–7.43)
pO2, Ven: 68 mmHg — ABNORMAL HIGH (ref 32–45)

## 2024-05-23 LAB — MAGNESIUM: Magnesium: 1.8 mg/dL (ref 1.7–2.4)

## 2024-05-23 LAB — DIGOXIN LEVEL: Digoxin Level: <0.2 ng/mL — ABNORMAL LOW (ref 0.8–2.0)

## 2024-05-23 MED ORDER — IOHEXOL 350 MG/ML SOLN
100.0000 mL | Freq: Once | INTRAVENOUS | Status: AC | PRN
  Administered 2024-05-23: 100 mL via INTRAVENOUS

## 2024-05-23 MED ORDER — FUROSEMIDE 10 MG/ML IJ SOLN
40.0000 mg | Freq: Once | INTRAMUSCULAR | Status: AC
  Administered 2024-05-23: 40 mg via INTRAVENOUS
  Filled 2024-05-23 (×2): qty 4

## 2024-05-23 MED ORDER — POTASSIUM CHLORIDE 20 MEQ PO PACK: 40.0000 meq | PACK | Freq: Once | ORAL | Status: DC

## 2024-05-23 NOTE — Discharge Instructions (Signed)
 You were seen in the emergency department for shortness of breath.  You had fluid on your lungs.  It was recommended that you be admitted to the hospital.  You instead wanted to leave AGAINST MEDICAL ADVICE.  Recommend that you follow-up with your cardiologist, call tomorrow to schedule close follow-up appointment.  Return to the emergency department if you have any worsening of symptoms or if your symptoms continue and you would like to be treated.  Concerned that you are not taking your medications as prescribed and that there is confusion of what medications that you are supposed to be taking and what doses.  Recommend you get some help with these medications and how to take them.

## 2024-05-23 NOTE — ED Provider Notes (Signed)
 [facility] Provider Note    Event Date/Time   First MD Initiated Contact with Patient 05/23/24 0009     (approximate)   History   Shortness of Breath   HPI  Ronald Horne is a 73 y.o. male past medical history significant for COPD, cardiomyopathy, hypertension, atrial tachycardia on Eliquis , presents to the emergency department for shortness of breath.  Patient Ronald Horne is shortness of breath that started tonight.  States that he intermittently has shortness of breath.  Denies any chest pain.  Also complaining of left lower abdominal pain.  Denies nausea or vomiting.  Does endorse history of constipation.  Followed by Dr. Deretha with cardiology and has recently been started on digoxin  and amiodarone  -patient has not been taking these medications and he is uncertain of what medications he should be taking.  On chart review patient was recently evaluated in the emergency department on 05/22/2024 and was evaluated by Dr. Hobert at that time.  Had presented to the emergency department with chest tightness.  Patient had a recommendation of IV Lasix  at that time but he declined and he did not want to urinate frequently.  Had a negative D-dimer and troponins at that time.     Physical Exam   Triage Vital Signs: ED Triage Vitals [05/22/24 2220]  Encounter Vitals Group     BP (!) 134/97     Girls Systolic BP Percentile      Girls Diastolic BP Percentile      Boys Systolic BP Percentile      Boys Diastolic BP Percentile      Pulse Rate (!) 102     Resp 20     Temp 97.6 F (36.4 C)     Temp src      SpO2 100 %     Weight 299 lb 15.7 oz (136.1 kg)     Height 6' 2 (1.88 m)     Head Circumference      Peak Flow      Pain Score 0     Pain Loc      Pain Education      Exclude from Growth Chart     Most recent vital signs: Vitals:   05/23/24 0309 05/23/24 0330  BP:  (!) 133/93  Pulse:  (!) 103  Resp:  20  Temp: 97.9 F (36.6 C)   SpO2:  99%    Physical  Exam Constitutional:      Appearance: He is well-developed.  HENT:     Head: Atraumatic.  Eyes:     Conjunctiva/sclera: Conjunctivae normal.  Cardiovascular:     Rate and Rhythm: Regular rhythm.  Pulmonary:     Effort: Tachypnea and respiratory distress present.     Breath sounds: Examination of the right-lower field reveals rales. Examination of the left-lower field reveals rales. Rhonchi and rales present.  Chest:     Chest wall: No tenderness.  Abdominal:     Palpations: Abdomen is soft.     Tenderness: There is no abdominal tenderness.  Musculoskeletal:     Cervical back: Normal range of motion.     Right lower leg: No edema.     Left lower leg: No edema.  Skin:    General: Skin is warm.     Capillary Refill: Capillary refill takes less than 2 seconds.  Neurological:     General: No focal deficit present.     Mental Status: He is alert and oriented to person, place, and  time. Mental status is at baseline.     IMPRESSION / MDM / ASSESSMENT AND PLAN / ED COURSE  I reviewed the triage vital signs and the nursing notes.  Differential diagnosis including ACS, pneumonia, pulmonary edema, heart failure exacerbation, pulmonary embolism,  EKG  I, Clotilda Punter, the attending physician, personally viewed and interpreted this ECG.  EKG showed underlying left bundle branch block.  Heart rate of 100.  PR 186, QRS 144, QTc 531.  No significant change compared to prior EKG  Tachycardic to 103 while on cardiac telemetry.  RADIOLOGY I independently reviewed imaging, my interpretation of imaging: Chest x-ray with cardiomegaly and findings concerning for pulmonary edema no focal findings consistent with pneumonia.  No widened mediastinum.  LABS (all labs ordered are listed, but only abnormal results are displayed) Labs interpreted as -    Labs Reviewed  BASIC METABOLIC PANEL WITH GFR - Abnormal; Notable for the following components:      Result Value   Potassium 3.2 (*)     Glucose, Bld 126 (*)    Creatinine, Ser 1.40 (*)    Calcium  8.2 (*)    GFR, Estimated 54 (*)    All other components within normal limits  BRAIN NATRIURETIC PEPTIDE - Abnormal; Notable for the following components:   B Natriuretic Peptide 950.7 (*)    All other components within normal limits  BLOOD GAS, VENOUS - Abnormal; Notable for the following components:   pH, Ven 7.46 (*)    pCO2, Ven 37 (*)    pO2, Ven 68 (*)    Acid-Base Excess 2.5 (*)    All other components within normal limits  DIGOXIN  LEVEL - Abnormal; Notable for the following components:   Digoxin  Level <0.2 (*)    All other components within normal limits  TROPONIN I (HIGH SENSITIVITY) - Abnormal; Notable for the following components:   Troponin I (High Sensitivity) 31 (*)    All other components within normal limits  CBC WITH DIFFERENTIAL/PLATELET  MAGNESIUM     MDM  Lab work with no leukocytosis or anemia.  Creatinine mildly elevated from his baseline at 1.4 with a baseline of 1.1-1.2 with a normal BUN at 17.  Mildly low potassium at 3.2.  Will add on a magnesium level  BNP is elevated at 950 which is increased from his previous 678 a couple of days ago.  Troponin stable at 31 and appears chronically elevated.  Chest x-ray with cardiomegaly and moderate pulmonary vascular congestion  Patient given IV Lasix .  Ordered potassium however patient refused.  Reevaluation patient can tenuous to have shortness of breath, speaking in one-word 1 sentences.  Discussed need for admission for respiratory distress and heart failure exacerbation.  Patient was adamant that he would not be admitted to the hospital and that he will follow-up as an outpatient with his doctors.  States that he has oxygen at home and every machine he needs at home.  Again discussed my concern of him going home and his frequent ER visits and not taking his medications appropriately.  He expressed understanding and stating that he was not going to stay in  the hospital.  Discussed if I could call his family members to see about making sure he was taking his medications correctly.  Patient refused and stated that I was not allowed to talk to any of his family members.  Stated that he would return if his symptoms worsened.  He has decisional capacity and has opted to leave regardless AGAINST  MEDICAL ADVICE      PROCEDURES:  Critical Care performed: No  Procedures  Patient's presentation is most consistent with acute presentation with potential threat to life or bodily function.   MEDICATIONS ORDERED IN ED: Medications  potassium chloride  (KLOR-CON ) packet 40 mEq (40 mEq Oral Patient Refused/Not Given 05/23/24 0319)  furosemide  (LASIX ) injection 40 mg (40 mg Intravenous Given 05/23/24 0309)  iohexol  (OMNIPAQUE ) 350 MG/ML injection 100 mL (100 mLs Intravenous Contrast Given 05/23/24 0248)    FINAL CLINICAL IMPRESSION(S) / ED DIAGNOSES   Final diagnoses:  Hypoxia  SOB (shortness of breath)     Rx / DC Orders   ED Discharge Orders     None        Note:  This document was prepared using Dragon voice recognition software and may include unintentional dictation errors.   Suzanne Kirsch, MD 05/23/24 [phone]

## 2024-05-23 NOTE — ED Notes (Addendum)
 MD. Suzanne was infomed of the pt's blood pressure prior to lasix  administation. She stated to hold off till his blood pressure has sustained over 100 systolic. The pt was informed of same.   The pt stated that he wanted to talk to the docotr about this.  MD.Mumma was sent a message with the pt's request. The pt was infomed that she was notified but, that this time she was in a emergency and it will be a few mintues before she can come and see him.  The pt stated that he was having an emergency too and has never had this happen before.   The pt was verbally deescalated but is still irritated.

## 2024-05-23 NOTE — ED Notes (Signed)
 Mumma MD at bedside stating pt wanting to leave AMA. Pt verbalized understanding of risks of leaving AMA. Form signed. IV access removed. Pt ambulatory with steady gait when leaving.

## 2024-06-10 ENCOUNTER — Other Ambulatory Visit: Payer: Self-pay | Admitting: Cardiovascular Disease

## 2024-06-10 DIAGNOSIS — R0789 Other chest pain: Secondary | ICD-10-CM

## 2024-06-10 DIAGNOSIS — I5021 Acute systolic (congestive) heart failure: Secondary | ICD-10-CM

## 2024-06-10 DIAGNOSIS — I200 Unstable angina: Secondary | ICD-10-CM

## 2024-06-10 DIAGNOSIS — R000 Tachycardia, unspecified: Secondary | ICD-10-CM

## 2024-06-10 DIAGNOSIS — G4733 Obstructive sleep apnea (adult) (pediatric): Secondary | ICD-10-CM

## 2024-06-10 DIAGNOSIS — I519 Heart disease, unspecified: Secondary | ICD-10-CM

## 2024-06-10 DIAGNOSIS — R0602 Shortness of breath: Secondary | ICD-10-CM

## 2024-06-10 DIAGNOSIS — I480 Paroxysmal atrial fibrillation: Secondary | ICD-10-CM

## 2024-06-13 ENCOUNTER — Ambulatory Visit (INDEPENDENT_AMBULATORY_CARE_PROVIDER_SITE_OTHER): Admitting: Cardiovascular Disease

## 2024-06-13 ENCOUNTER — Other Ambulatory Visit: Payer: Self-pay

## 2024-06-13 ENCOUNTER — Encounter: Payer: Self-pay | Admitting: Cardiovascular Disease

## 2024-06-13 VITALS — BP 130/92 | HR 125 | Ht 74.0 in | Wt 301.8 lb

## 2024-06-13 DIAGNOSIS — I5021 Acute systolic (congestive) heart failure: Secondary | ICD-10-CM

## 2024-06-13 DIAGNOSIS — I4891 Unspecified atrial fibrillation: Secondary | ICD-10-CM | POA: Diagnosis not present

## 2024-06-13 DIAGNOSIS — I739 Peripheral vascular disease, unspecified: Secondary | ICD-10-CM

## 2024-06-13 DIAGNOSIS — R0789 Other chest pain: Secondary | ICD-10-CM

## 2024-06-13 DIAGNOSIS — R0602 Shortness of breath: Secondary | ICD-10-CM

## 2024-06-13 DIAGNOSIS — I519 Heart disease, unspecified: Secondary | ICD-10-CM | POA: Diagnosis not present

## 2024-06-13 DIAGNOSIS — G4733 Obstructive sleep apnea (adult) (pediatric): Secondary | ICD-10-CM

## 2024-06-13 DIAGNOSIS — I200 Unstable angina: Secondary | ICD-10-CM

## 2024-06-13 DIAGNOSIS — I10 Essential (primary) hypertension: Secondary | ICD-10-CM | POA: Diagnosis not present

## 2024-06-13 DIAGNOSIS — R000 Tachycardia, unspecified: Secondary | ICD-10-CM

## 2024-06-13 MED ORDER — TORSEMIDE 10 MG PO TABS: 10.0000 mg | ORAL_TABLET | Freq: Two times a day (BID) | ORAL | 3 refills | Status: DC

## 2024-06-13 MED ORDER — TORSEMIDE 10 MG PO TABS: 10.0000 mg | ORAL_TABLET | Freq: Two times a day (BID) | ORAL | 11 refills | Status: AC

## 2024-06-13 NOTE — Progress Notes (Signed)
 Cardiology Office Note   Date:  06/13/2024   ID:  Ronald Horne, DOB Dec 06, 1950, MRN [id number]  PCP:  Weyman Bright, MD  Cardiologist:  Denyse Bathe, MD      History of Present Illness: Ronald Horne is a 73 y.o. male who presents for  Chief Complaint  Patient presents with   Acute Visit    SOB    Patient is SOB, and PND, and orthopnea. Also heart rate was over 110. No chest pain.      Past Medical History:  Diagnosis Date   Arthritis    Cardiomyopathy (HCC)    a. 08/2016 Echo: EF 30-35%, diff HK; b. 03/2018 Ex MV (Jadali): Ex time 2:00, large anter defect, EF 24%; c. 03/2018 Echo: EF 20-25%, diff HK. Gr1 DD; d. Refused cath.   Chronic back pain    Chronic combined systolic (congestive) and diastolic (congestive) heart failure (HCC)    a. 08/2016 Echo: EF 30-35%, diff HK, mild to mod MR, mild BAE; b. 03/2018 Echo: EF 20-25%, diff HK. Gr1 DD. MIldly dil Ao root. Mild BAE. Mildly reduced RV fxn.   COPD (chronic obstructive pulmonary disease) (HCC)    GERD (gastroesophageal reflux disease)    Gout    Hypertension    Obesity    Sleep apnea      Past Surgical History:  Procedure Laterality Date   EPIDURAL BLOCK INJECTION     chronic back pain    LEFT HEART CATH AND CORONARY ANGIOGRAPHY Left 12/27/2019   Procedure: LEFT HEART CATH AND CORONARY ANGIOGRAPHY;  Surgeon: Bathe Denyse LABOR, MD;  Location: [facility];  Service: Cardiovascular;  Laterality: Left;   TONSILLECTOMY       Current Outpatient Medications  Medication Sig Dispense Refill   acetaminophen  (TYLENOL ) 500 MG tablet Take 500-1,000 mg by mouth every 6 (six) hours as needed (pain.).     albuterol  (VENTOLIN  HFA) 108 (90 Base) MCG/ACT inhaler Inhale 1-2 puffs into the lungs every 6 (six) hours as needed for shortness of breath or wheezing.     amiodarone  (PACERONE ) 200 MG tablet Take 4 tablets/day for 1 week, then 3 tablets/day for 1 week, then 2 tablets/day for 1 week. Then follow up. 70 tablet 0    APPLE CIDER VINEGAR PO Take 15 mLs by mouth in the morning and at bedtime.     atorvastatin  (LIPITOR) 20 MG tablet Take 20 mg by mouth in the morning.     digoxin  (LANOXIN ) 0.25 MG tablet Take 1 tablet (250 mcg total) by mouth daily. 30 tablet 11   ELIQUIS  5 MG TABS tablet TAKE 1 TABLET TWICE A DAY 60 tablet 0   Ensifentrine  (OHTUVAYRE ) 3 MG/2.5ML SUSP Inhale 2.5 mLs into the lungs in the morning and at bedtime.     ENTRESTO  97-103 MG TAKE 1 TABLET BY MOUTH TWICE A DAY 60 tablet 6   FARXIGA  10 MG TABS tablet TAKE 1 TABLET BY MOUTH DAILY 30 tablet 6   methimazole  (TAPAZOLE ) 5 MG tablet Take 5 mg by mouth in the morning.     metoprolol  succinate (TOPROL  XL) 100 MG 24 hr tablet Take 1 tablet (100 mg total) by mouth daily. Take with or immediately following a meal. 30 tablet 11   Multiple Vitamin (MULTIVITAMIN WITH MINERALS) TABS tablet Take 1 tablet by mouth daily.     OVER THE COUNTER MEDICATION Take 1 Dose by mouth daily. Sea [place]     predniSONE  (DELTASONE ) 20 MG tablet Take 3 tablets (  60 mg total) by mouth daily. 15 tablet 0   spironolactone  (ALDACTONE ) 25 MG tablet TAKE 1/2 TABLET BY MOUTH DAILY 15 tablet 6   torsemide  (DEMADEX ) 10 MG tablet Take 1 tablet (10 mg total) by mouth 2 (two) times daily. 60 tablet 11   torsemide  (DEMADEX ) 10 MG tablet Take 1 tablet (10 mg total) by mouth 2 (two) times daily. 180 tablet 3   gabapentin (NEURONTIN) 300 MG capsule Take 300 mg by mouth at bedtime. (Patient not taking: Reported on 06/13/2024)     No current facility-administered medications for this visit.    Allergies:   Patient has no known allergies.    Social History:   reports that he quit smoking about 47 years ago. His smoking use included cigarettes. He started smoking about 48 years ago. He has a 3 pack-year smoking history. He has never used smokeless tobacco. He reports that he does not drink alcohol and does not use drugs.   Family History:  family history is not on file.    ROS:      Review of Systems  Constitutional: Negative.   HENT: Negative.    Eyes: Negative.   Respiratory: Negative.    Gastrointestinal: Negative.   Genitourinary: Negative.   Musculoskeletal: Negative.   Skin: Negative.   Neurological: Negative.   Endo/Heme/Allergies: Negative.   Psychiatric/Behavioral: Negative.    All other systems reviewed and are negative.     All other systems are reviewed and negative.    PHYSICAL EXAM: VS:  BP (!) 130/92   Pulse (!) 125   Ht 6' 2 (1.88 m)   Wt (!) 301 lb 12.8 oz (136.9 kg)   SpO2 96%   BMI 38.75 kg/m  , BMI Body mass index is 38.75 kg/m. Last weight:  Wt Readings from Last 3 Encounters:  06/13/24 (!) 301 lb 12.8 oz (136.9 kg)  05/22/24 299 lb 15.7 oz (136.1 kg)  05/22/24 300 lb (136.1 kg)     Physical Exam Vitals reviewed.  Constitutional:      Appearance: Normal appearance. He is normal weight.  HENT:     Head: Normocephalic.     Nose: Nose normal.     Mouth/Throat:     Mouth: Mucous membranes are moist.  Eyes:     Pupils: Pupils are equal, round, and reactive to light.  Cardiovascular:     Rate and Rhythm: Normal rate and regular rhythm.     Pulses: Normal pulses.     Heart sounds: Normal heart sounds.  Pulmonary:     Effort: Pulmonary effort is normal.  Abdominal:     General: Abdomen is flat. Bowel sounds are normal.  Musculoskeletal:        General: Normal range of motion.     Cervical back: Normal range of motion.  Skin:    General: Skin is warm.  Neurological:     General: No focal deficit present.     Mental Status: He is alert.  Psychiatric:        Mood and Affect: Mood normal.    77   EKG: afib 113/min NSIVCD  Recent Labs: 04/10/2024: ALT 19 05/22/2024: B Natriuretic Peptide 950.7; BUN 17; Creatinine, Ser 1.40; Hemoglobin 15.3; Platelets 189; Potassium 3.2; Sodium 141 05/23/2024: Magnesium 1.8    Lipid Panel No results found for: CHOL, TRIG, HDL, CHOLHDL, VLDL, LDLCALC, LDLDIRECT     Other studies Reviewed: Additional studies/ records that were reviewed today include:  Review of the above records demonstrates:  04/12/2018   11:13 AM  PAD Screen  Previous PAD dx? No  Previous surgical procedure? No  Pain with walking? No  Feet/toe relief with dangling? No  Painful, non-healing ulcers? No  Extremities discolored? No      ASSESSMENT AND PLAN:    ICD-10-CM   1. Atrial fibrillation with rapid ventricular response (HCC)  I48.91 torsemide  (DEMADEX ) 10 MG tablet   EKG has afib with VR 113/min, change amiodrone loading dose 800 daily.    2. Cardiopathy  I51.9 torsemide  (DEMADEX ) 10 MG tablet    3. PAD (peripheral artery disease) (HCC)  I73.9 torsemide  (DEMADEX ) 10 MG tablet    4. SOB (shortness of breath)  R06.02 torsemide  (DEMADEX ) 10 MG tablet    5. Primary hypertension  I10 torsemide  (DEMADEX ) 10 MG tablet    6. Acute HFrEF (heart failure with reduced ejection fraction) (HCC)  I50.21 torsemide  (DEMADEX ) 10 MG tablet   change toresemide 10 bid       Problem List Items Addressed This Visit       Cardiovascular and Mediastinum   Acute HFrEF (heart failure with reduced ejection fraction) (HCC)   Relevant Medications   torsemide  (DEMADEX ) 10 MG tablet   Hypertension   Relevant Medications   torsemide  (DEMADEX ) 10 MG tablet   PAD (peripheral artery disease) (HCC)   Relevant Medications   torsemide  (DEMADEX ) 10 MG tablet   Cardiopathy   Relevant Medications   torsemide  (DEMADEX ) 10 MG tablet     Other   SOB (shortness of breath)   Relevant Medications   torsemide  (DEMADEX ) 10 MG tablet   Other Visit Diagnoses       Atrial fibrillation with rapid ventricular response (HCC)    -  Primary   EKG has afib with VR 113/min, change amiodrone loading dose 800 daily.   Relevant Medications   torsemide  (DEMADEX ) 10 MG tablet          Disposition:   Return in about 2 weeks (around 06/27/2024).    Total time spent: 30  minutes  Signed,  Denyse Bathe, MD  06/13/2024 11:58 AM    [facility]

## 2024-06-14 ENCOUNTER — Other Ambulatory Visit: Payer: Self-pay

## 2024-06-14 DIAGNOSIS — I480 Paroxysmal atrial fibrillation: Secondary | ICD-10-CM

## 2024-06-14 DIAGNOSIS — I739 Peripheral vascular disease, unspecified: Secondary | ICD-10-CM

## 2024-06-14 DIAGNOSIS — R0789 Other chest pain: Secondary | ICD-10-CM

## 2024-06-14 DIAGNOSIS — I519 Heart disease, unspecified: Secondary | ICD-10-CM

## 2024-06-14 MED ORDER — AMIODARONE HCL 200 MG PO TABS
ORAL_TABLET | ORAL | 0 refills | Status: DC
Start: 2024-06-14 — End: 2024-07-09

## 2024-06-17 ENCOUNTER — Ambulatory Visit: Admitting: Cardiovascular Disease

## 2024-06-17 ENCOUNTER — Ambulatory Visit (INDEPENDENT_AMBULATORY_CARE_PROVIDER_SITE_OTHER): Admitting: Cardiovascular Disease

## 2024-06-17 ENCOUNTER — Encounter: Payer: Self-pay | Admitting: Cardiovascular Disease

## 2024-06-17 VITALS — BP 88/68 | HR 105 | Ht 74.0 in | Wt 293.4 lb

## 2024-06-17 DIAGNOSIS — I5021 Acute systolic (congestive) heart failure: Secondary | ICD-10-CM | POA: Diagnosis not present

## 2024-06-17 DIAGNOSIS — R0602 Shortness of breath: Secondary | ICD-10-CM | POA: Diagnosis not present

## 2024-06-17 DIAGNOSIS — I4891 Unspecified atrial fibrillation: Secondary | ICD-10-CM | POA: Diagnosis not present

## 2024-06-17 DIAGNOSIS — I5020 Unspecified systolic (congestive) heart failure: Secondary | ICD-10-CM

## 2024-06-17 DIAGNOSIS — I519 Heart disease, unspecified: Secondary | ICD-10-CM | POA: Diagnosis not present

## 2024-06-17 DIAGNOSIS — Z0130 Encounter for examination of blood pressure without abnormal findings: Secondary | ICD-10-CM

## 2024-06-17 NOTE — Progress Notes (Signed)
 Cardiology Office Note   Date:  06/17/2024   ID:  Ronald Horne, DOB [birth year], MRN [id number]  PCP:  Weyman Bright, MD  Cardiologist:  Denyse Bathe, MD      History of Present Illness: Ronald Horne is a 73 y.o. male who presents for  Chief Complaint  Patient presents with   Follow-up    2 months     Still SOB, did not take amiodrone until today 4 a day.      Past Medical History:  Diagnosis Date   Arthritis    Cardiomyopathy (HCC)    a. 08/2016 Echo: EF 30-35%, diff HK; b. 03/2018 Ex MV ([facility]): Ex time 2:00, large anter defect, EF 24%; c. 03/2018 Echo: EF 20-25%, diff HK. Gr1 DD; d. Refused cath.   Chronic back pain    Chronic combined systolic (congestive) and diastolic (congestive) heart failure (HCC)    a. 08/2016 Echo: EF 30-35%, diff HK, mild to mod MR, mild BAE; b. 03/2018 Echo: EF 20-25%, diff HK. Gr1 DD. MIldly dil Ao root. Mild BAE. Mildly reduced RV fxn.   COPD (chronic obstructive pulmonary disease) (HCC)    GERD (gastroesophageal reflux disease)    Gout    Hypertension    Obesity    Sleep apnea      Past Surgical History:  Procedure Laterality Date   EPIDURAL BLOCK INJECTION     chronic back pain    LEFT HEART CATH AND CORONARY ANGIOGRAPHY Left 12/27/2019   Procedure: LEFT HEART CATH AND CORONARY ANGIOGRAPHY;  Surgeon: Bathe Denyse LABOR, MD;  Location: [facility];  Service: Cardiovascular;  Laterality: Left;   TONSILLECTOMY       Current Outpatient Medications  Medication Sig Dispense Refill   albuterol  (VENTOLIN  HFA) 108 (90 Base) MCG/ACT inhaler Inhale 1-2 puffs into the lungs every 6 (six) hours as needed for shortness of breath or wheezing.     amiodarone  (PACERONE ) 200 MG tablet Take 4 tablets/day for 1 week, then 3 tablets/day for 1 week, then 2 tablets/day for 1 week. Then follow up. 70 tablet 0   amoxicillin-clavulanate (AUGMENTIN) 875-125 MG tablet Take 1 tablet by mouth 2 (two) times daily.     APPLE CIDER VINEGAR PO Take 15  mLs by mouth in the morning and at bedtime.     atorvastatin  (LIPITOR) 20 MG tablet Take 20 mg by mouth in the morning.     digoxin  (LANOXIN ) 0.25 MG tablet Take 1 tablet (250 mcg total) by mouth daily. 30 tablet 11   ELIQUIS  5 MG TABS tablet TAKE 1 TABLET TWICE A DAY 60 tablet 0   ENTRESTO  97-103 MG TAKE 1 TABLET BY MOUTH TWICE A DAY 60 tablet 6   famotidine (PEPCID) 40 MG tablet Take 40 mg by mouth.     FARXIGA  10 MG TABS tablet TAKE 1 TABLET BY MOUTH DAILY 30 tablet 6   HYDROcodone-acetaminophen  (NORCO/VICODIN) 5-325 MG tablet Take 1 tablet by mouth every 4 (four) hours as needed.     methimazole  (TAPAZOLE ) 5 MG tablet Take 5 mg by mouth in the morning.     metoprolol  succinate (TOPROL  XL) 100 MG 24 hr tablet Take 1 tablet (100 mg total) by mouth daily. Take with or immediately following a meal. 30 tablet 11   Multiple Vitamin (MULTIVITAMIN WITH MINERALS) TABS tablet Take 1 tablet by mouth daily.     omeprazole (PRILOSEC) 20 MG capsule Take 20 mg by mouth daily.     OVER THE COUNTER  MEDICATION Take 1 Dose by mouth daily. Sea [place]     spironolactone  (ALDACTONE ) 25 MG tablet TAKE 1/2 TABLET BY MOUTH DAILY 15 tablet 6   torsemide  (DEMADEX ) 10 MG tablet Take 1 tablet (10 mg total) by mouth 2 (two) times daily. 60 tablet 11   Vitamin D, Ergocalciferol, (DRISDOL) 1.25 MG (50000 UNIT) CAPS capsule      No current facility-administered medications for this visit.    Allergies:   Patient has no known allergies.    Social History:   reports that he quit smoking about 47 years ago. His smoking use included cigarettes. He started smoking about 48 years ago. He has a 3 pack-year smoking history. He has never used smokeless tobacco. He reports that he does not drink alcohol and does not use drugs.   Family History:  family history is not on file.    ROS:     Review of Systems  Constitutional: Negative.   HENT: Negative.    Eyes: Negative.   Respiratory: Negative.    Gastrointestinal: Negative.    Genitourinary: Negative.   Musculoskeletal: Negative.   Skin: Negative.   Neurological: Negative.   Endo/Heme/Allergies: Negative.   Psychiatric/Behavioral: Negative.    All other systems reviewed and are negative.     All other systems are reviewed and negative.    PHYSICAL EXAM: VS:  BP (!) 88/68   Pulse (!) 105   Ht 6' 2 (1.88 m)   Wt 293 lb 6.4 oz (133.1 kg)   SpO2 97%   BMI 37.67 kg/m  , BMI Body mass index is 37.67 kg/m. Last weight:  Wt Readings from Last 3 Encounters:  06/17/24 293 lb 6.4 oz (133.1 kg)  06/13/24 (!) 301 lb 12.8 oz (136.9 kg)  05/22/24 299 lb 15.7 oz (136.1 kg)     Physical Exam Vitals reviewed.  Constitutional:      Appearance: Normal appearance. He is normal weight.  HENT:     Head: Normocephalic.     Nose: Nose normal.     Mouth/Throat:     Mouth: Mucous membranes are moist.  Eyes:     Pupils: Pupils are equal, round, and reactive to light.  Cardiovascular:     Rate and Rhythm: Normal rate and regular rhythm.     Pulses: Normal pulses.     Heart sounds: Normal heart sounds.  Pulmonary:     Effort: Pulmonary effort is normal.  Abdominal:     General: Abdomen is flat. Bowel sounds are normal.  Musculoskeletal:        General: Normal range of motion.     Cervical back: Normal range of motion.  Skin:    General: Skin is warm.  Neurological:     General: No focal deficit present.     Mental Status: He is alert.  Psychiatric:        Mood and Affect: Mood normal.       EKG:   Recent Labs: 04/10/2024: ALT 19 05/22/2024: B Natriuretic Peptide 950.7; BUN 17; Creatinine, Ser 1.40; Hemoglobin 15.3; Platelets 189; Potassium 3.2; Sodium 141 05/23/2024: Magnesium 1.8    Lipid Panel No results found for: CHOL, TRIG, HDL, CHOLHDL, VLDL, LDLCALC, LDLDIRECT    Other studies Reviewed: Additional studies/ records that were reviewed today include:  Review of the above records demonstrates:      04/12/2018   11:13 AM   PAD Screen  Previous PAD dx? No  Previous surgical procedure? No  Pain with walking? No  Feet/toe relief with dangling? No  Painful, non-healing ulcers? No  Extremities discolored? No      ASSESSMENT AND PLAN:    ICD-10-CM   1. HFrEF (heart failure with reduced ejection fraction) (HCC)  I50.20    Take extra dose toresemide bid    2. SOB (shortness of breath)  R06.02     3. Acute HFrEF (heart failure with reduced ejection fraction) (HCC)  I50.21     4. Cardiopathy  I51.9     5. Atrial fibrillation with rapid ventricular response (HCC)  I48.91    Start amiodrone 4 a day       Problem List Items Addressed This Visit       Cardiovascular and Mediastinum   Acute HFrEF (heart failure with reduced ejection fraction) (HCC)   Cardiopathy     Other   SOB (shortness of breath)   Other Visit Diagnoses       HFrEF (heart failure with reduced ejection fraction) (HCC)    -  Primary   Take extra dose toresemide bid     Atrial fibrillation with rapid ventricular response (HCC)       Start amiodrone 4 a day          Disposition:   Return in about 1 week (around 06/24/2024).    Total time spent: 35 minutes  Signed,  Denyse Bathe, MD  06/17/2024 11:48 AM    [facility]

## 2024-06-20 ENCOUNTER — Ambulatory Visit: Admitting: Cardiology

## 2024-06-24 ENCOUNTER — Encounter: Payer: Self-pay | Admitting: Cardiovascular Disease

## 2024-06-24 ENCOUNTER — Ambulatory Visit (INDEPENDENT_AMBULATORY_CARE_PROVIDER_SITE_OTHER): Admitting: Cardiovascular Disease

## 2024-06-24 VITALS — BP 108/62 | HR 96 | Ht 74.0 in | Wt 296.8 lb

## 2024-06-24 DIAGNOSIS — I519 Heart disease, unspecified: Secondary | ICD-10-CM

## 2024-06-24 DIAGNOSIS — I5020 Unspecified systolic (congestive) heart failure: Secondary | ICD-10-CM

## 2024-06-24 DIAGNOSIS — R0602 Shortness of breath: Secondary | ICD-10-CM | POA: Diagnosis not present

## 2024-06-24 DIAGNOSIS — I480 Paroxysmal atrial fibrillation: Secondary | ICD-10-CM | POA: Diagnosis not present

## 2024-06-24 DIAGNOSIS — I5021 Acute systolic (congestive) heart failure: Secondary | ICD-10-CM

## 2024-06-24 DIAGNOSIS — Z0130 Encounter for examination of blood pressure without abnormal findings: Secondary | ICD-10-CM

## 2024-06-24 NOTE — Progress Notes (Signed)
 Cardiology Office Note   Date:  06/24/2024   ID:  Henson Fraticelli, DOB 07/14/1951, MRN [id number]  PCP:  Weyman Bright, MD  Cardiologist:  Denyse Bathe, MD      History of Present Illness: Ronald Horne is a 73 y.o. male who presents for  Chief Complaint  Patient presents with   Follow-up    1 week follow up    Feeling better, no chest pain and no palpitation.      Past Medical History:  Diagnosis Date   Arthritis    Cardiomyopathy (HCC)    a. 08/2016 Echo: EF 30-35%, diff HK; b. 03/2018 Ex MV (Jadali): Ex time 2:00, large anter defect, EF 24%; c. 03/2018 Echo: EF 20-25%, diff HK. Gr1 DD; d. Refused cath.   Chronic back pain    Chronic combined systolic (congestive) and diastolic (congestive) heart failure (HCC)    a. 08/2016 Echo: EF 30-35%, diff HK, mild to mod MR, mild BAE; b. 03/2018 Echo: EF 20-25%, diff HK. Gr1 DD. MIldly dil Ao root. Mild BAE. Mildly reduced RV fxn.   COPD (chronic obstructive pulmonary disease) (HCC)    GERD (gastroesophageal reflux disease)    Gout    Hypertension    Obesity    Sleep apnea      Past Surgical History:  Procedure Laterality Date   EPIDURAL BLOCK INJECTION     chronic back pain    LEFT HEART CATH AND CORONARY ANGIOGRAPHY Left 12/27/2019   Procedure: LEFT HEART CATH AND CORONARY ANGIOGRAPHY;  Surgeon: Bathe Denyse LABOR, MD;  Location: [facility];  Service: Cardiovascular;  Laterality: Left;   TONSILLECTOMY       Current Outpatient Medications  Medication Sig Dispense Refill   albuterol  (VENTOLIN  HFA) 108 (90 Base) MCG/ACT inhaler Inhale 1-2 puffs into the lungs every 6 (six) hours as needed for shortness of breath or wheezing.     amiodarone  (PACERONE ) 200 MG tablet Take 4 tablets/day for 1 week, then 3 tablets/day for 1 week, then 2 tablets/day for 1 week. Then follow up. 70 tablet 0   amoxicillin-clavulanate (AUGMENTIN) 875-125 MG tablet Take 1 tablet by mouth 2 (two) times daily.     APPLE CIDER VINEGAR PO Take  15 mLs by mouth in the morning and at bedtime.     atorvastatin  (LIPITOR) 20 MG tablet Take 20 mg by mouth in the morning.     digoxin  (LANOXIN ) 0.25 MG tablet Take 1 tablet (250 mcg total) by mouth daily. 30 tablet 11   ELIQUIS  5 MG TABS tablet TAKE 1 TABLET TWICE A DAY 60 tablet 0   ENTRESTO  97-103 MG TAKE 1 TABLET BY MOUTH TWICE A DAY 60 tablet 6   famotidine (PEPCID) 40 MG tablet Take 40 mg by mouth.     FARXIGA  10 MG TABS tablet TAKE 1 TABLET BY MOUTH DAILY 30 tablet 6   HYDROcodone-acetaminophen  (NORCO/VICODIN) 5-325 MG tablet Take 1 tablet by mouth every 4 (four) hours as needed.     methimazole  (TAPAZOLE ) 5 MG tablet Take 5 mg by mouth in the morning.     metoprolol  succinate (TOPROL  XL) 100 MG 24 hr tablet Take 1 tablet (100 mg total) by mouth daily. Take with or immediately following a meal. 30 tablet 11   Multiple Vitamin (MULTIVITAMIN WITH MINERALS) TABS tablet Take 1 tablet by mouth daily.     omeprazole (PRILOSEC) 20 MG capsule Take 20 mg by mouth daily.     OVER THE COUNTER MEDICATION Take  1 Dose by mouth daily. [place]     spironolactone  (ALDACTONE ) 25 MG tablet TAKE 1/2 TABLET BY MOUTH DAILY 15 tablet 6   torsemide  (DEMADEX ) 10 MG tablet Take 1 tablet (10 mg total) by mouth 2 (two) times daily. 60 tablet 11   Vitamin D, Ergocalciferol, (DRISDOL) 1.25 MG (50000 UNIT) CAPS capsule      No current facility-administered medications for this visit.    Allergies:   Patient has no known allergies.    Social History:   reports that he quit smoking about 47 years ago. His smoking use included cigarettes. He started smoking about 48 years ago. He has a 3 pack-year smoking history. He has never used smokeless tobacco. He reports that he does not drink alcohol and does not use drugs.   Family History:  family history is not on file.    ROS:     Review of Systems  Constitutional: Negative.   HENT: Negative.    Eyes: Negative.   Respiratory: Negative.    Gastrointestinal:  Negative.   Genitourinary: Negative.   Musculoskeletal: Negative.   Skin: Negative.   Neurological: Negative.   Endo/Heme/Allergies: Negative.   Psychiatric/Behavioral: Negative.    All other systems reviewed and are negative.     All other systems are reviewed and negative.    PHYSICAL EXAM: VS:  BP 108/62   Pulse 96   Ht 6' 2 (1.88 m)   Wt 296 lb 12.8 oz (134.6 kg)   SpO2 97%   BMI 38.11 kg/m  , BMI Body mass index is 38.11 kg/m. Last weight:  Wt Readings from Last 3 Encounters:  06/24/24 296 lb 12.8 oz (134.6 kg)  06/17/24 293 lb 6.4 oz (133.1 kg)  06/13/24 (!) 301 lb 12.8 oz (136.9 kg)     Physical Exam Vitals reviewed.  Constitutional:      Appearance: Normal appearance. He is normal weight.  HENT:     Head: Normocephalic.     Nose: Nose normal.     Mouth/Throat:     Mouth: Mucous membranes are moist.  Eyes:     Pupils: Pupils are equal, round, and reactive to light.  Cardiovascular:     Rate and Rhythm: Normal rate and regular rhythm.     Pulses: Normal pulses.     Heart sounds: Normal heart sounds.  Pulmonary:     Effort: Pulmonary effort is normal.  Abdominal:     General: Abdomen is flat. Bowel sounds are normal.  Musculoskeletal:        General: Normal range of motion.     Cervical back: Normal range of motion.  Skin:    General: Skin is warm.  Neurological:     General: No focal deficit present.     Mental Status: He is alert.  Psychiatric:        Mood and Affect: Mood normal.       EKG:   Recent Labs: 04/10/2024: ALT 19 05/22/2024: B Natriuretic Peptide 950.7; BUN 17; Creatinine, Ser 1.40; Hemoglobin 15.3; Platelets 189; Potassium 3.2; Sodium 141 05/23/2024: Magnesium 1.8    Lipid Panel No results found for: CHOL, TRIG, HDL, CHOLHDL, VLDL, LDLCALC, LDLDIRECT    Other studies Reviewed: Additional studies/ records that were reviewed today include:  Review of the above records demonstrates:      04/12/2018   11:13  AM  PAD Screen  Previous PAD dx? No  Previous surgical procedure? No  Pain with walking? No  Feet/toe relief with dangling?  No  Painful, non-healing ulcers? No  Extremities discolored? No      ASSESSMENT AND PLAN:    ICD-10-CM   1. HFrEF (heart failure with reduced ejection fraction) (HCC)  I50.20    On GDMT, doing better    2. SOB (shortness of breath)  R06.02     3. Acute HFrEF (heart failure with reduced ejection fraction) (HCC)  I50.21     4. Cardiopathy  I51.9     5. Paroxysmal atrial fibrillation (HCC)  I48.0    on amiodrone 3 mg daily. In NSR       Problem List Items Addressed This Visit       Cardiovascular and Mediastinum   Acute HFrEF (heart failure with reduced ejection fraction) (HCC)   Cardiopathy     Other   SOB (shortness of breath)   Other Visit Diagnoses       HFrEF (heart failure with reduced ejection fraction) (HCC)    -  Primary   On GDMT, doing better     Paroxysmal atrial fibrillation (HCC)       on amiodrone 3 mg daily. In NSR          Disposition:   Return in about 2 weeks (around 07/08/2024).    Total time spent: 30 minutes  Signed,  Denyse Bathe, MD  06/24/2024 11:15 AM    [facility]

## 2024-07-09 ENCOUNTER — Encounter: Payer: Self-pay | Admitting: Cardiovascular Disease

## 2024-07-09 ENCOUNTER — Ambulatory Visit (INDEPENDENT_AMBULATORY_CARE_PROVIDER_SITE_OTHER): Admitting: Cardiovascular Disease

## 2024-07-09 VITALS — BP 102/60 | HR 94 | Ht 74.0 in | Wt 298.0 lb

## 2024-07-09 DIAGNOSIS — I519 Heart disease, unspecified: Secondary | ICD-10-CM | POA: Diagnosis not present

## 2024-07-09 DIAGNOSIS — I10 Essential (primary) hypertension: Secondary | ICD-10-CM | POA: Diagnosis not present

## 2024-07-09 DIAGNOSIS — I739 Peripheral vascular disease, unspecified: Secondary | ICD-10-CM

## 2024-07-09 DIAGNOSIS — I480 Paroxysmal atrial fibrillation: Secondary | ICD-10-CM

## 2024-07-09 DIAGNOSIS — R0602 Shortness of breath: Secondary | ICD-10-CM | POA: Diagnosis not present

## 2024-07-09 DIAGNOSIS — I5021 Acute systolic (congestive) heart failure: Secondary | ICD-10-CM | POA: Diagnosis not present

## 2024-07-09 DIAGNOSIS — R0789 Other chest pain: Secondary | ICD-10-CM

## 2024-07-09 DIAGNOSIS — I200 Unstable angina: Secondary | ICD-10-CM

## 2024-07-09 MED ORDER — AMIODARONE HCL 200 MG PO TABS: 200.0000 mg | ORAL_TABLET | Freq: Every day | ORAL | 2 refills | Status: DC

## 2024-07-09 NOTE — Progress Notes (Addendum)
 Cardiology Office Note   Date:  07/09/2024   ID:  Ronald Horne, DOB 03-28-1951, MRN [id number]  PCP:  [name], NP  Cardiologist:  Denyse Bathe, MD      History of Present Illness: Ronald Horne is a 73 y.o. male who presents for  Chief Complaint  Patient presents with  . Follow-up    2 week follow up    Feels much better.      Past Medical History:  Diagnosis Date  . Arthritis   . Cardiomyopathy (HCC)    a. 08/2016 Echo: EF 30-35%, diff HK; b. 03/2018 Ex MV ([facility]): Ex time 2:00, large anter defect, EF 24%; c. 03/2018 Echo: EF 20-25%, diff HK. Gr1 DD; d. Refused cath.  . Chronic back pain   . Chronic combined systolic (congestive) and diastolic (congestive) heart failure (HCC)    a. 08/2016 Echo: EF 30-35%, diff HK, mild to mod MR, mild BAE; b. 03/2018 Echo: EF 20-25%, diff HK. Gr1 DD. MIldly dil Ao root. Mild BAE. Mildly reduced RV fxn.  SABRA COPD (chronic obstructive pulmonary disease) (HCC)   . GERD (gastroesophageal reflux disease)   . Gout   . Hypertension   . Obesity   . Sleep apnea      Past Surgical History:  Procedure Laterality Date  . EPIDURAL BLOCK INJECTION     chronic back pain   . LEFT HEART CATH AND CORONARY ANGIOGRAPHY Left 12/27/2019   Procedure: LEFT HEART CATH AND CORONARY ANGIOGRAPHY;  Surgeon: Bathe Denyse LABOR, MD;  Location: [facility];  Service: Cardiovascular;  Laterality: Left;  . TONSILLECTOMY       Current Outpatient Medications  Medication Sig Dispense Refill  . albuterol  (VENTOLIN  HFA) 108 (90 Base) MCG/ACT inhaler Inhale 1-2 puffs into the lungs every 6 (six) hours as needed for shortness of breath or wheezing.    . APPLE CIDER VINEGAR PO Take 15 mLs by mouth in the morning and at bedtime.    . atorvastatin  (LIPITOR) 20 MG tablet Take 20 mg by mouth in the morning.    . digoxin  (LANOXIN ) 0.25 MG tablet Take 1 tablet (250 mcg total) by mouth daily. 30 tablet 11  . ELIQUIS  5 MG TABS tablet TAKE 1 TABLET TWICE A DAY  60 tablet 0  . ENTRESTO  97-103 MG TAKE 1 TABLET BY MOUTH TWICE A DAY 60 tablet 6  . famotidine (PEPCID) 40 MG tablet Take 40 mg by mouth.    . FARXIGA  10 MG TABS tablet TAKE 1 TABLET BY MOUTH DAILY 30 tablet 6  . HYDROcodone-acetaminophen  (NORCO/VICODIN) 5-325 MG tablet Take 1 tablet by mouth every 4 (four) hours as needed.    . methimazole  (TAPAZOLE ) 5 MG tablet Take 5 mg by mouth in the morning.    . metoprolol  succinate (TOPROL  XL) 100 MG 24 hr tablet Take 1 tablet (100 mg total) by mouth daily. Take with or immediately following a meal. 30 tablet 11  . Multiple Vitamin (MULTIVITAMIN WITH MINERALS) TABS tablet Take 1 tablet by mouth daily.    SABRA omeprazole (PRILOSEC) 20 MG capsule Take 20 mg by mouth daily.    . spironolactone  (ALDACTONE ) 25 MG tablet TAKE 1/2 TABLET BY MOUTH DAILY 15 tablet 6  . torsemide  (DEMADEX ) 10 MG tablet Take 1 tablet (10 mg total) by mouth 2 (two) times daily. 60 tablet 11  . amiodarone  (PACERONE ) 200 MG tablet Take 1 tablet (200 mg total) by mouth daily. 30 tablet 2   No  current facility-administered medications for this visit.    Allergies:   Patient has no known allergies.    Social History:   reports that he quit smoking about 47 years ago. His smoking use included cigarettes. He started smoking about 48 years ago. He has a 3 pack-year smoking history. He has never used smokeless tobacco. He reports that he does not drink alcohol and does not use drugs.   Family History:  family history is not on file.    ROS:     Review of Systems  Constitutional: Negative.   HENT: Negative.    Eyes: Negative.   Respiratory: Negative.    Gastrointestinal: Negative.   Genitourinary: Negative.   Musculoskeletal: Negative.   Skin: Negative.   Neurological: Negative.   Endo/Heme/Allergies: Negative.   Psychiatric/Behavioral: Negative.    All other systems reviewed and are negative.     All other systems are reviewed and negative.    PHYSICAL EXAM: VS:  BP  102/60   Pulse 94   Ht 6' 2 (1.88 m)   Wt 298 lb (135.2 kg)   SpO2 98%   BMI 38.26 kg/m  , BMI Body mass index is 38.26 kg/m. Last weight:  Wt Readings from Last 3 Encounters:  07/09/24 298 lb (135.2 kg)  06/24/24 296 lb 12.8 oz (134.6 kg)  06/17/24 293 lb 6.4 oz (133.1 kg)     Physical Exam Vitals reviewed.  Constitutional:      Appearance: Normal appearance. He is normal weight.  HENT:     Head: Normocephalic.     Nose: Nose normal.     Mouth/Throat:     Mouth: Mucous membranes are moist.  Eyes:     Pupils: Pupils are equal, round, and reactive to light.  Cardiovascular:     Rate and Rhythm: Normal rate and regular rhythm.     Pulses: Normal pulses.     Heart sounds: Normal heart sounds.  Pulmonary:     Effort: Pulmonary effort is normal.  Abdominal:     General: Abdomen is flat. Bowel sounds are normal.  Musculoskeletal:        General: Normal range of motion.     Cervical back: Normal range of motion.  Skin:    General: Skin is warm.  Neurological:     General: No focal deficit present.     Mental Status: He is alert.  Psychiatric:        Mood and Affect: Mood normal.       EKG:   Recent Labs: 04/10/2024: ALT 19 05/22/2024: B Natriuretic Peptide 950.7; BUN 17; Creatinine, Ser 1.40; Hemoglobin 15.3; Platelets 189; Potassium 3.2; Sodium 141 05/23/2024: Magnesium 1.8    Lipid Panel No results found for: CHOL, TRIG, HDL, CHOLHDL, VLDL, LDLCALC, LDLDIRECT    Other studies Reviewed: Additional studies/ records that were reviewed today include:  Review of the above records demonstrates:      04/12/2018   11:13 AM  PAD Screen  Previous PAD dx? No  Previous surgical procedure? No  Pain with walking? No  Feet/toe relief with dangling? No  Painful, non-healing ulcers? No  Extremities discolored? No      ASSESSMENT AND PLAN:    ICD-10-CM   1. SOB (shortness of breath)  R06.02    Feels good, no SOB or chest pain.    2. Acute HFrEF  (heart failure with reduced ejection fraction) (HCC)  I50.21     3. Cardiopathy  I51.9 amiodarone  (PACERONE ) 200 MG tablet  4. Primary hypertension  I10     5. PAD (peripheral artery disease) (HCC)  I73.9 amiodarone  (PACERONE ) 200 MG tablet    6. Unstable angina (HCC)  I20.0     7. Paroxysmal atrial fibrillation (HCC)  I48.0 amiodarone  (PACERONE ) 200 MG tablet   Go back to amiodrone, loading dose 800 daily and change digoxin  0.25 daily as HR 118/min as in Afib with RVR    8. Other chest pain  R07.89 amiodarone  (PACERONE ) 200 MG tablet       Problem List Items Addressed This Visit       Cardiovascular and Mediastinum   Acute HFrEF (heart failure with reduced ejection fraction) (HCC)   Relevant Medications   amiodarone  (PACERONE ) 200 MG tablet   Hypertension   Relevant Medications   amiodarone  (PACERONE ) 200 MG tablet   PAD (peripheral artery disease) (HCC)   Relevant Medications   amiodarone  (PACERONE ) 200 MG tablet   Cardiopathy   Relevant Medications   amiodarone  (PACERONE ) 200 MG tablet   Unstable angina (HCC)   Relevant Medications   amiodarone  (PACERONE ) 200 MG tablet     Other   SOB (shortness of breath) - Primary   Other chest pain   Relevant Medications   amiodarone  (PACERONE ) 200 MG tablet   Other Visit Diagnoses       Paroxysmal atrial fibrillation (HCC)       Go back to amiodrone, loading dose 800 daily and change digoxin  0.25 daily as HR 118/min as in Afib with RVR   Relevant Medications   amiodarone  (PACERONE ) 200 MG tablet          Disposition:   Return in about 4 weeks (around 08/06/2024).    Total time spent: 30 minutes  Signed,  Denyse Bathe, MD  07/09/2024 9:31 AM    [facility]

## 2024-07-10 ENCOUNTER — Other Ambulatory Visit: Payer: Self-pay | Admitting: Cardiovascular Disease

## 2024-07-10 DIAGNOSIS — I200 Unstable angina: Secondary | ICD-10-CM

## 2024-07-10 DIAGNOSIS — R000 Tachycardia, unspecified: Secondary | ICD-10-CM

## 2024-07-10 DIAGNOSIS — I519 Heart disease, unspecified: Secondary | ICD-10-CM

## 2024-07-10 DIAGNOSIS — I5021 Acute systolic (congestive) heart failure: Secondary | ICD-10-CM

## 2024-07-10 DIAGNOSIS — R0789 Other chest pain: Secondary | ICD-10-CM

## 2024-07-10 DIAGNOSIS — R0602 Shortness of breath: Secondary | ICD-10-CM

## 2024-07-10 DIAGNOSIS — G4733 Obstructive sleep apnea (adult) (pediatric): Secondary | ICD-10-CM

## 2024-07-10 DIAGNOSIS — I480 Paroxysmal atrial fibrillation: Secondary | ICD-10-CM

## 2024-07-23 ENCOUNTER — Other Ambulatory Visit: Payer: Self-pay

## 2024-07-23 ENCOUNTER — Telehealth: Payer: Self-pay

## 2024-07-23 ENCOUNTER — Emergency Department

## 2024-07-23 ENCOUNTER — Ambulatory Visit (HOSPITAL_BASED_OUTPATIENT_CLINIC_OR_DEPARTMENT_OTHER): Admitting: Student in an Organized Health Care Education/Training Program

## 2024-07-23 ENCOUNTER — Encounter: Payer: Self-pay | Admitting: Student in an Organized Health Care Education/Training Program

## 2024-07-23 ENCOUNTER — Emergency Department
Admission: EM | Admit: 2024-07-23 | Discharge: 2024-07-23 | Discharge status: Against Medical Advice | Attending: Student in an Organized Health Care Education/Training Program | Admitting: Student in an Organized Health Care Education/Training Program

## 2024-07-23 VITALS — BP 95/83 | HR 105 | Temp 97.3°F | Resp 17 | Ht 74.0 in | Wt 295.0 lb

## 2024-07-23 DIAGNOSIS — R0789 Other chest pain: Secondary | ICD-10-CM | POA: Insufficient documentation

## 2024-07-23 DIAGNOSIS — M5412 Radiculopathy, cervical region: Secondary | ICD-10-CM | POA: Diagnosis present

## 2024-07-23 DIAGNOSIS — M47816 Spondylosis without myelopathy or radiculopathy, lumbar region: Secondary | ICD-10-CM | POA: Diagnosis present

## 2024-07-23 DIAGNOSIS — G894 Chronic pain syndrome: Secondary | ICD-10-CM | POA: Insufficient documentation

## 2024-07-23 DIAGNOSIS — R0602 Shortness of breath: Secondary | ICD-10-CM | POA: Diagnosis present

## 2024-07-23 DIAGNOSIS — M47812 Spondylosis without myelopathy or radiculopathy, cervical region: Secondary | ICD-10-CM | POA: Insufficient documentation

## 2024-07-23 DIAGNOSIS — I509 Heart failure, unspecified: Secondary | ICD-10-CM | POA: Insufficient documentation

## 2024-07-23 DIAGNOSIS — Z5321 Procedure and treatment not carried out due to patient leaving prior to being seen by health care provider: Secondary | ICD-10-CM | POA: Diagnosis not present

## 2024-07-23 LAB — BASIC METABOLIC PANEL WITH GFR
Anion gap: 9 (ref 5–15)
BUN: 17 mg/dL (ref 8–23)
CO2: 23 mmol/L (ref 22–32)
Calcium: 7.9 mg/dL — ABNORMAL LOW (ref 8.9–10.3)
Chloride: 105 mmol/L (ref 98–111)
Creatinine, Ser: 1.06 mg/dL (ref 0.61–1.24)
GFR, Estimated: >60 mL/min (ref >60)
Glucose, Bld: 117 mg/dL — ABNORMAL HIGH (ref 70–99)
Potassium: 3.3 mmol/L — ABNORMAL LOW (ref 3.5–5.1)
Sodium: 137 mmol/L (ref 135–145)

## 2024-07-23 LAB — CBC
HCT: 44.4 % (ref 39.0–52.0)
Hemoglobin: 15.1 g/dL (ref 13.0–17.0)
MCH: 30.4 pg (ref 26.0–34.0)
MCHC: 34 g/dL (ref 30.0–36.0)
MCV: 89.5 fL (ref 80.0–100.0)
Platelets: 209 K/uL (ref 150–400)
RBC: 4.96 MIL/uL (ref 4.22–5.81)
RDW: 13.7 % (ref 11.5–15.5)
WBC: 8.6 K/uL (ref 4.0–10.5)
nRBC: 0.2 % (ref 0.0–0.2)

## 2024-07-23 LAB — BRAIN NATRIURETIC PEPTIDE: B Natriuretic Peptide: 717.7 pg/mL — ABNORMAL HIGH (ref 0.0–100.0)

## 2024-07-23 LAB — TROPONIN I (HIGH SENSITIVITY): Troponin I (High Sensitivity): 22 ng/L — ABNORMAL HIGH (ref <18)

## 2024-07-23 NOTE — Progress Notes (Signed)
 Safety precautions to be maintained throughout the outpatient stay will include: orient to surroundings, keep bed in low position, maintain call bell within reach at all times, provide assistance with transfer out of bed and ambulation.

## 2024-07-23 NOTE — Progress Notes (Signed)
 PROVIDER NOTE: Interpretation of information contained herein should be left to medically-trained personnel. Specific patient instructions are provided elsewhere under Patient Instructions section of medical record. This document was created in part using AI and STT-dictation technology, any transcriptional errors that may result from this process are unintentional.  Patient: Ronald Horne  Service: E/M Encounter  Provider: Wallie Sherry, MD  DOB: [birth year]  Delivery: Face-to-face  Specialty: Interventional Pain Management  MRN: [id number]  Setting: Ambulatory outpatient facility  Specialty designation: 09  Type: New Patient  Location: Outpatient office facility  PCP: [name], NP  DOS: 07/23/2024    Referring Prov.: [name], NP   Primary Reason(s) for Visit: Encounter for initial evaluation of one or more chronic problems (new to examiner) potentially causing chronic pain, and posing a threat to normal musculoskeletal function. (Level of risk: High) CC: Back Pain (low) and Shoulder Pain (right)  HPI  Ronald Horne is a 73 y.o. year old, male patient, who comes for the first time to our practice referred by [name], NP for our initial evaluation of his chronic pain. He has Acute coronary syndrome [facility]); CHF exacerbation (HCC); Acute HFrEF (heart failure with reduced ejection fraction) (HCC); Obesity; Hypertension; GERD (gastroesophageal reflux disease); Hyperthyroidism; Hypokalemia; Hyperglycemia; Elevated troponin; Acute respiratory failure with hypoxia (HCC); PAD (peripheral artery disease); CHF (congestive heart failure) (HCC); Cardiopathy; SOB (shortness of breath); Obstructive sleep apnea syndrome; Sinus tachycardia; Other chest pain; Unstable angina (HCC); Lumbar spondylosis; Cervical spondylosis; Cervical radicular pain; and Chronic pain syndrome on their problem list. Today he comes in for evaluation of his Back Pain (low) and Shoulder Pain (right)  Pain Assessment: Location:  Right Shoulder Radiating: to right neck Onset: More than a month ago Duration: Chronic pain Quality: Tightness Severity: 7 /10 (subjective, self-reported pain score)  Effect on ADL: limits adls Timing: Constant Modifying factors: hydrocodone/APAP BP: 95/83  HR: (!) 105  Onset and Duration: Present longer than 3 months Cause of pain: Unknown Severity: NAS-11 at its worse: 10/10, NAS-11 at its best: 5/10, NAS-11 now: 7/10, and NAS-11 on the average: 7/10 Timing: Night Aggravating Factors: Bending, Prolonged standing, and Walking Alleviating Factors: Cold packs, Hot packs, Medications, and Relaxation therapy Associated Problems: Constipation, Night-time cramps, Depression, Dizziness, and Pain that wakes patient up Quality of Pain: Aching, Throbbing, and Uncomfortable Previous Examinations or Tests: MRI scan and X-rays Previous Treatments: Relaxation therapy  Ronald Horne is being evaluated for possible interventional pain management therapies for the treatment of his chronic pain.   Discussed the use of AI scribe software for clinical note transcription with the patient, who gave verbal consent to proceed.  History of Present Illness   Ronald Horne is a 73 year old male with degenerative joint disease and arthritis who presents with left shoulder pain and low back pain. He was referred by Camelia Myrna for evaluation of left shoulder pain and low back pain.  He experiences low back pain on both sides of the spine, which is currently mild but can become very aggravating throughout the day. 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 He has not received any injections for his lower back, although he has had neck injections in the past that did not provide lasting relief. He attempted physical therapy but was unable to continue after two days due to difficulty with the exercises.  He has RIGHT shoulder pain that has been present for  about six months. The pain  has radiated from the shoulder to the neck muscle but does not extend past the elbow. He previously received a cortisone injection in the right shoulder three years ago, which resolved similar symptoms. An MRI of the C-spine  was done in December, revealing arthritis and disc herniations in the neck. He has not had surgery on the shoulder.  He has a history of receiving epidural injections in the neck, which provided temporary relief. He is currently out of medication and has previously been prescribed hydrocodone by PCP. He is concerned about the habit-forming nature of hydrocodone and is seeking alternative pain management options.       Historic Controlled Substance Pharmacotherapy Review PMP and historical list of controlled substances:   07/09/2024 07/09/2024  1 Hydrocodone-Acetaminophen  5-32 28.00 7 Cr Kin [id number] Gen (0953) 0/0 20.00 MME Medicare [place]  06/26/2024 06/26/2024  1 Hydrocodone-Acetaminophen  5-32 28.00 7 Cr Kin [id number] Gen (0953) 0/0 20.00 MME Medicare [place]    Historical Monitoring: The patient  reports no history of drug use. List of prior UDS Testing: No results found for: MDMA, COCAINSCRNUR, PCPSCRNUR, PCPQUANT, CANNABQUANT, THCU, ETH, CBDTHCR, D8THCCBX, D9THCCBX Historical Background Evaluation: [place] PMP: PDMP reviewed during this encounter. Review of the past 15-months conducted.               [place] Department of public safety, offender search: Engineer, mining Information) Non-contributory Risk Assessment Profile: Aberrant behavior: None observed or detected today Risk factors for fatal opioid overdose: None identified today. Fatal overdose hazard ratio (HR): Calculation deferred Non-fatal overdose hazard ratio (HR): Calculation deferred Risk of opioid abuse or dependence: 0.7-3.0% with doses <= 36 MME/day and 6.1-26% with doses >= 120 MME/day. Substance use disorder (SUD) risk level: See below Personal History of Substance Abuse  (SUD-Substance use disorder):  Alcohol: Negative  Illegal Drugs: Negative  Rx Drugs: Negative  ORT Risk Level calculation: Low Risk  Opioid Risk Tool - 07/23/24 1004       Family History of Substance Abuse   Alcohol Negative    Illegal Drugs Negative    Rx Drugs Negative      Personal History of Substance Abuse   Alcohol Negative    Illegal Drugs Negative    Rx Drugs Negative      Age   Age between 16-45 years  No      History of Preadolescent Sexual Abuse   History of Preadolescent Sexual Abuse Negative or Male      Psychological Disease   Depression Positive      Total Score   Opioid Risk Tool Scoring 1    Opioid Risk Interpretation Low Risk         ORT Scoring interpretation table:  Score <3 = Low Risk for SUD  Score between 4-7 = Moderate Risk for SUD  Score >8 = High Risk for Opioid Abuse   PHQ-2 Depression Scale:  Total score: 4  PHQ-2 Scoring interpretation table: (Score and probability of major depressive disorder)  Score 0 = No depression  Score 1 = 15.4% Probability  Score 2 = 21.1% Probability  Score 3 = 38.4% Probability  Score 4 = 45.5% Probability  Score 5 = 56.4% Probability  Score 6 = 78.6% Probability   PHQ-9 Depression Scale:  Total score: 4  PHQ-9 Scoring interpretation table:  Score 0-4 = No depression  Score 5-9 = Mild depression  Score 10-14 = Moderate depression  Score 15-19 = Moderately severe depression  Score 20-27 = Severe depression (2.4 times higher risk of  SUD and 2.89 times higher risk of overuse)   Pharmacologic Plan: As per protocol, I have not taken over any controlled substance management, pending the results of ordered tests and/or consults.            Initial impression: Pending review of available data and ordered tests.  Meds   Current Outpatient Medications:    albuterol  (VENTOLIN  HFA) 108 (90 Base) MCG/ACT inhaler, Inhale 1-2 puffs into the lungs every 6 (six) hours as needed for shortness of breath or  wheezing., Disp: , Rfl:    amiodarone  (PACERONE ) 200 MG tablet, Take 1 tablet (200 mg total) by mouth daily., Disp: 30 tablet, Rfl: 2   APPLE CIDER VINEGAR PO, Take 15 mLs by mouth in the morning and at bedtime., Disp: , Rfl:    atorvastatin  (LIPITOR) 20 MG tablet, Take 20 mg by mouth in the morning., Disp: , Rfl:    digoxin  (LANOXIN ) 0.25 MG tablet, Take 1 tablet (250 mcg total) by mouth daily., Disp: 30 tablet, Rfl: 11   ELIQUIS  5 MG TABS tablet, TAKE 1 TABLET TWICE A DAY, Disp: 60 tablet, Rfl: 0   ENTRESTO  97-103 MG, TAKE 1 TABLET BY MOUTH TWICE A DAY, Disp: 60 tablet, Rfl: 6   famotidine (PEPCID) 40 MG tablet, Take 40 mg by mouth., Disp: , Rfl:    FARXIGA  10 MG TABS tablet, TAKE 1 TABLET DAILY, Disp: 30 tablet, Rfl: 6   HYDROcodone-acetaminophen  (NORCO/VICODIN) 5-325 MG tablet, Take 1 tablet by mouth every 4 (four) hours as needed., Disp: , Rfl:    methimazole  (TAPAZOLE ) 5 MG tablet, Take 5 mg by mouth in the morning., Disp: , Rfl:    metoprolol  succinate (TOPROL  XL) 100 MG 24 hr tablet, Take 1 tablet (100 mg total) by mouth daily. Take with or immediately following a meal., Disp: 30 tablet, Rfl: 11   Multiple Vitamin (MULTIVITAMIN WITH MINERALS) TABS tablet, Take 1 tablet by mouth daily., Disp: , Rfl:    omeprazole (PRILOSEC) 20 MG capsule, Take 20 mg by mouth daily., Disp: , Rfl:    spironolactone  (ALDACTONE ) 25 MG tablet, TAKE 1/2 TABLET BY MOUTH DAILY, Disp: 15 tablet, Rfl: 6   torsemide  (DEMADEX ) 10 MG tablet, Take 1 tablet (10 mg total) by mouth 2 (two) times daily., Disp: 60 tablet, Rfl: 11  Imaging Review  Cervical Imaging: Cervical MR wo contrast: Results for orders placed during the hospital encounter of 09/23/23  MR CERVICAL SPINE WO CONTRAST  Narrative CLINICAL DATA:  Neck pain and stiffness for 3 weeks. No known injury.  EXAM: MRI CERVICAL SPINE WITHOUT CONTRAST  TECHNIQUE: Multiplanar, multisequence MR imaging of the cervical spine was performed. No intravenous  contrast was administered.  COMPARISON:  None Available.  FINDINGS: Alignment: Normal.  Vertebrae: No fracture, evidence of discitis, or bone lesion.  Cord: Normal signal throughout.  Posterior Fossa, vertebral arteries, paraspinal tissues: Negative.  Disc levels:  C2-3: Negative.  C3-4: There is right much worse than left facet arthropathy with marrow edema in the right facets which could be a source neck pain. Shallow disc bulge and uncovertebral spurring are present. Mild to moderate bilateral foraminal narrowing is seen and the ventral thecal sac is narrowed but not effaced.  C4-5: Shallow broad-based central protrusion, bilateral facet degenerative change and uncovertebral spurring. The ventral thecal sac is effaced. Mild to moderate foraminal narrowing is worse on the right.  C5-6: There is a shallow disc bulge and uncovertebral spurring the central canal and right foramen are open. Mild to  moderate left foraminal narrowing is present.  C6-7: Shallow disc bulge and uncovertebral spurring, worse on the left. The central canal is open. Mild to moderate foraminal narrowing is worse on the left.  C7-T1: Shallow disc bulge without stenosis.  IMPRESSION: 1. Right much worse than left facet arthropathy at C3-4 with marrow edema in the right facets which could be a source of neck pain. Mild to moderate bilateral foraminal narrowing is present at this level. 2. Shallow broad-based central protrusion at C4-5 effaces the ventral thecal sac. Mild to moderate foraminal narrowing is worse on the right. 3. Mild to moderate left foraminal narrowing at C5-6 and C6-7.   Electronically Signed By: Debby Prader M.D. On: 10/10/2023 09:40  DG Shoulder Right  Narrative CLINICAL DATA:  Right shoulder pain  EXAM: RIGHT SHOULDER - 2+ VIEW  COMPARISON:  None Available.  FINDINGS: Osteoarthrosis with subchondral cystic changes of the humeral head  No other fracture or  bony or articular abnormalities  IMPRESSION: Osteoarthrosis   Electronically Signed By: Franky Chard M.D. On: 01/31/2024 15:07  DG Lumbar Spine Complete  Narrative CLINICAL DATA:  low back pain  EXAM: LUMBAR SPINE - COMPLETE 5 VIEW  COMPARISON:  None Available.  FINDINGS: No fracture, dislocation or subluxation. No spondylolisthesis. No osteolytic or osteoblastic changes.  Degenerative disc disease noted with disc space narrowing and marginal osteophytes at L4-L5-S1.  IMPRESSION: Degenerative changes. No acute osseous abnormalities.   Electronically Signed By: Fonda Field M.D. On: 07/01/2023 15:07  Complexity Note: Imaging results reviewed.                         ROS  Cardiovascular: Heart trouble, High blood pressure, Heart failure, and Blood thinners:  Anticoagulant Pulmonary or Respiratory: Lung problems and Shortness of breath Neurological: No reported neurological signs or symptoms such as seizures, abnormal skin sensations, urinary and/or fecal incontinence, being born with an abnormal open spine and/or a tethered spinal cord Psychological-Psychiatric: Depressed Gastrointestinal: Vomiting blood (Ulcers) and Reflux or heatburn Genitourinary: No reported renal or genitourinary signs or symptoms such as difficulty voiding or producing urine, peeing blood, non-functioning kidney, kidney stones, difficulty emptying the bladder, difficulty controlling the flow of urine, or chronic kidney disease Hematological: No reported hematological signs or symptoms such as prolonged bleeding, low or poor functioning platelets, bruising or bleeding easily, hereditary bleeding problems, low energy levels due to low hemoglobin or being anemic Endocrine: No reported endocrine signs or symptoms such as high or low blood sugar, rapid heart rate due to high thyroid levels, obesity or weight gain due to slow thyroid or thyroid disease Rheumatologic: No reported rheumatological signs  and symptoms such as fatigue, joint pain, tenderness, swelling, redness, heat, stiffness, decreased range of motion, with or without associated rash Musculoskeletal: Negative for myasthenia gravis, muscular dystrophy, multiple sclerosis or malignant hyperthermia Work History: Legally disabled  Allergies  Ronald Horne has no known allergies.  Laboratory Chemistry Profile   Renal Lab Results  Component Value Date   BUN 17 07/23/2024   CREATININE 1.06 07/23/2024   BCR 13 02/09/2024   GFRAA >60 12/27/2019   GFRNONAA >60 07/23/2024     Electrolytes Lab Results  Component Value Date   NA 137 07/23/2024   K 3.3 (L) 07/23/2024   CL 105 07/23/2024   CALCIUM  7.9 (L) 07/23/2024   MG 1.8 05/23/2024     Hepatic Lab Results  Component Value Date   AST 20 04/10/2024   ALT 19  04/10/2024   ALBUMIN 3.2 (L) 04/10/2024   ALKPHOS 51 04/10/2024   LIPASE 31 04/10/2024     ID Lab Results  Component Value Date   HIV Non Reactive 07/30/2022   SARSCOV2NAA NEGATIVE 12/14/2023     Bone No results found for: VD25OH, CI874NY7UNU, CI6874NY7, CI7874NY7, 25OHVITD1, 25OHVITD2, 25OHVITD3, TESTOFREE, TESTOSTERONE   Endocrine Lab Results  Component Value Date   GLUCOSE 117 (H) 07/23/2024   HGBA1C 5.8 (H) 07/31/2022   TSH 0.376 07/30/2022     Neuropathy Lab Results  Component Value Date   HGBA1C 5.8 (H) 07/31/2022   HIV Non Reactive 07/30/2022     CNS No results found for: COLORCSF, APPEARCSF, RBCCOUNTCSF, WBCCSF, POLYSCSF, LYMPHSCSF, EOSCSF, PROTEINCSF, GLUCCSF, JCVIRUS, CSFOLI, IGGCSF, LABACHR, ACETBL   Inflammation (CRP: Acute  ESR: Chronic) Lab Results  Component Value Date   LATICACIDVEN 2.1 (HH) 05/19/2023     Rheumatology No results found for: RF, ANA, LABURIC, URICUR, LYMEIGGIGMAB, LYMEABIGMQN, HLAB27   Coagulation Lab Results  Component Value Date   INR 1.0 12/27/2019   LABPROT 12.9 12/27/2019   PLT 209  07/23/2024   DDIMER 0.32 05/22/2024     Cardiovascular Lab Results  Component Value Date   BNP 717.7 (H) 07/23/2024   HGB 15.1 07/23/2024   HCT 44.4 07/23/2024     Screening Lab Results  Component Value Date   SARSCOV2NAA NEGATIVE 12/14/2023   HIV Non Reactive 07/30/2022     Cancer No results found for: CEA, CA125, LABCA2   Allergens No results found for: ALMOND, APPLE, ASPARAGUS, AVOCADO, BANANA, BARLEY, BASIL, BAYLEAF, GREENBEAN, LIMABEAN, WHITEBEAN, BEEFIGE, REDBEET, BLUEBERRY, BROCCOLI, CABBAGE, MELON, CARROT, CASEIN, CASHEWNUT, CAULIFLOWER, CELERY     Note: Lab results reviewed.  PFSH  Drug: Ronald Horne  reports no history of drug use. Alcohol:  reports no history of alcohol use. Tobacco:  reports that he quit smoking about 47 years ago. His smoking use included cigarettes. He started smoking about 48 years ago. He has a 3 pack-year smoking history. He has never used smokeless tobacco. Medical:  has a past medical history of Arthritis, Cardiomyopathy (HCC), Chronic back pain, Chronic combined systolic (congestive) and diastolic (congestive) heart failure (HCC), COPD (chronic obstructive pulmonary disease) (HCC), GERD (gastroesophageal reflux disease), Gout, Hypertension, Obesity, and Sleep apnea. Family: family history is not on file.  Past Surgical History:  Procedure Laterality Date   EPIDURAL BLOCK INJECTION     chronic back pain    LEFT HEART CATH AND CORONARY ANGIOGRAPHY Left 12/27/2019   Procedure: LEFT HEART CATH AND CORONARY ANGIOGRAPHY;  Surgeon: Fernand Denyse LABOR, MD;  Location: [facility] INVASIVE CV LAB;  Service: Cardiovascular;  Laterality: Left;   TONSILLECTOMY     Active Ambulatory Problems    Diagnosis Date Noted   Acute coronary syndrome (HCC) 11/28/2019   CHF exacerbation (HCC) 07/30/2022   Acute HFrEF (heart failure with reduced ejection fraction) (HCC) 07/30/2022   Obesity    Hypertension    GERD  (gastroesophageal reflux disease)    Hyperthyroidism    Hypokalemia 07/30/2022   Hyperglycemia 07/30/2022   Elevated troponin 07/30/2022   Acute respiratory failure with hypoxia (HCC) 07/31/2022   PAD (peripheral artery disease) 09/02/2022   CHF (congestive heart failure) (HCC) 05/19/2023   Cardiopathy 02/09/2024   SOB (shortness of breath) 02/09/2024   Obstructive sleep apnea syndrome 02/09/2024   Sinus tachycardia 02/09/2024   Other chest pain 02/09/2024   Unstable angina (HCC) 02/09/2024   Lumbar spondylosis 07/23/2024   Cervical spondylosis 07/23/2024  Cervical radicular pain 07/23/2024   Chronic pain syndrome 07/23/2024   Resolved Ambulatory Problems    Diagnosis Date Noted   Acute heart failure with preserved ejection fraction (HFpEF) (HCC) 05/19/2023   Past Medical History:  Diagnosis Date   Arthritis    Cardiomyopathy (HCC)    Chronic back pain    Chronic combined systolic (congestive) and diastolic (congestive) heart failure (HCC)    COPD (chronic obstructive pulmonary disease) (HCC)    Gout    Sleep apnea    Constitutional Exam  General appearance: Well nourished, well developed, and well hydrated. In no apparent acute distress Vitals:   07/23/24 1005  BP: 95/83  Pulse: (!) 105  Resp: 17  Temp: (!) 97.3 F (36.3 C)  SpO2: 98%  Weight: 295 lb (133.8 kg)  Height: 6' 2 (1.88 m)   BMI Assessment: Estimated body mass index is 37.88 kg/m as calculated from the following:   Height as of this encounter: 6' 2 (1.88 m).   Weight as of this encounter: 295 lb (133.8 kg).  BMI interpretation table: BMI level Category Range association with higher incidence of chronic pain  <18 kg/m2 Underweight   18.5-24.9 kg/m2 Ideal body weight   25-29.9 kg/m2 Overweight Increased incidence by 20%  30-34.9 kg/m2 Obese (Class I) Increased incidence by 68%  35-39.9 kg/m2 Severe obesity (Class II) Increased incidence by 136%  >40 kg/m2 Extreme obesity (Class III) Increased  incidence by 254%   Patient's current BMI Ideal Body weight  Body mass index is 37.88 kg/m. Ideal body weight: 82.2 kg (181 lb 3.5 oz) Adjusted ideal body weight: 102.8 kg (226 lb 11.7 oz)   BMI Readings from Last 4 Encounters:  07/23/24 37.88 kg/m  07/23/24 37.88 kg/m  07/09/24 38.26 kg/m  06/24/24 38.11 kg/m   Wt Readings from Last 4 Encounters:  07/23/24 295 lb (133.8 kg)  07/23/24 295 lb (133.8 kg)  07/09/24 298 lb (135.2 kg)  06/24/24 296 lb 12.8 oz (134.6 kg)    Psych/Mental status: Alert, oriented x 3 (person, place, & time)       Eyes: PERLA Respiratory: No evidence of acute respiratory distress  Cervical Spine Area Exam  Skin & Axial Inspection: No masses, redness, edema, swelling, or associated skin lesions Alignment: Symmetrical Functional ROM: Pain restricted ROM, to the right Stability: No instability detected Muscle Tone/Strength: Functionally intact. No obvious neuro-muscular anomalies detected. Sensory (Neurological): Musculoskeletal pain pattern and dermatomal Palpation: No palpable anomalies             Upper Extremity (UE) Exam    Side: Right upper extremity  Side: Left upper extremity  Skin & Extremity Inspection: Skin color, temperature, and hair growth are WNL. No peripheral edema or cyanosis. No masses, redness, swelling, asymmetry, or associated skin lesions. No contractures.  Skin & Extremity Inspection: Skin color, temperature, and hair growth are WNL. No peripheral edema or cyanosis. No masses, redness, swelling, asymmetry, or associated skin lesions. No contractures.  Functional ROM: Unrestricted ROM          Functional ROM: Unrestricted ROM          Muscle Tone/Strength: Functionally intact. No obvious neuro-muscular anomalies detected.  Muscle Tone/Strength: Functionally intact. No obvious neuro-muscular anomalies detected.  Sensory (Neurological): Musculoskeletal pain pattern          Sensory (Neurological): Unimpaired          Palpation: No  palpable anomalies              Palpation:  No palpable anomalies              Provocative Test(s):  Phalen's test: deferred Tinel's test: deferred Apley's scratch test (touch opposite shoulder):  Action 1 (Across chest): Decreased ROM Action 2 (Overhead): Decreased ROM Action 3 (LB reach): Decreased ROM   Provocative Test(s):  Phalen's test: deferred Tinel's test: deferred Apley's scratch test (touch opposite shoulder):  Action 1 (Across chest): deferred Action 2 (Overhead): deferred Action 3 (LB reach): deferred    Thoracic Spine Area Exam  Skin & Axial Inspection: No masses, redness, or swelling Alignment: Symmetrical Functional ROM: Unrestricted ROM Stability: No instability detected Muscle Tone/Strength: Functionally intact. No obvious neuro-muscular anomalies detected. Sensory (Neurological): Unimpaired Muscle strength & Tone: No palpable anomalies Lumbar Spine Area Exam  Skin & Axial Inspection: No masses, redness, or swelling Alignment: Symmetrical Functional ROM: Pain restricted ROM affecting both sides Stability: No instability detected Muscle Tone/Strength: Functionally intact. No obvious neuro-muscular anomalies detected. Sensory (Neurological): Musculoskeletal pain pattern Palpation: No palpable anomalies       Provocative Tests: Hyperextension/rotation test: (+) bilaterally for facet joint pain. Lumbar quadrant test (Kemp's test): (+) bilaterally for facet joint pain.   Gait & Posture Assessment  Ambulation: Unassisted Gait: Relatively normal for age and body habitus Posture: WNL  Lower Extremity Exam    Side: Right lower extremity  Side: Left lower extremity  Stability: No instability observed          Stability: No instability observed          Skin & Extremity Inspection: Skin color, temperature, and hair growth are WNL. No peripheral edema or cyanosis. No masses, redness, swelling, asymmetry, or associated skin lesions. No contractures.  Skin & Extremity  Inspection: Skin color, temperature, and hair growth are WNL. No peripheral edema or cyanosis. No masses, redness, swelling, asymmetry, or associated skin lesions. No contractures.  Functional ROM: Unrestricted ROM                  Functional ROM: Unrestricted ROM                  Muscle Tone/Strength: Functionally intact. No obvious neuro-muscular anomalies detected.  Muscle Tone/Strength: Functionally intact. No obvious neuro-muscular anomalies detected.  Sensory (Neurological): Unimpaired        Sensory (Neurological): Unimpaired        DTR: Patellar: deferred today Achilles: deferred today Plantar: deferred today  DTR: Patellar: deferred today Achilles: deferred today Plantar: deferred today  Palpation: No palpable anomalies  Palpation: No palpable anomalies    Assessment  Primary Diagnosis & Pertinent Problem List: The primary encounter diagnosis was Lumbar facet arthropathy. Diagnoses of Lumbar spondylosis, Cervical facet joint syndrome, Cervical spondylosis, Cervical radicular pain, and Chronic pain syndrome were also pertinent to this visit.  Visit Diagnosis (New problems to examiner): 1. Lumbar facet arthropathy   2. Lumbar spondylosis   3. Cervical facet joint syndrome   4. Cervical spondylosis   5. Cervical radicular pain   6. Chronic pain syndrome    Plan of Care (Initial workup plan)  General Recommendations: The pain condition that the patient suffers from is best treated with a multidisciplinary approach that involves an increase in physical activity to prevent de-conditioning and worsening of the pain cycle, as well as psychological counseling (formal and/or informal) to address the co-morbid psychological affects of pain. Treatment will often involve judicious use of pain medications and interventional procedures to decrease the pain, allowing the patient to participate in the physical activity  that will ultimately produce long-lasting pain reductions. The goal of the  multidisciplinary approach is to return the patient to a higher level of overall function and to restore their ability to perform activities of daily living.   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 The pain is mild but can be very aggravating throughout the day. Ronald Horne has a history of greater than 3 months of moderate to severe pain which is resulted in functional impairment.  The patient has tried various conservative therapeutic options such as NSAIDs, Tylenol , muscle relaxants, physical therapy which was inadequately effective.  Patient's pain is predominantly axial with physical exam and L-MRI findings suggestive of facet arthropathy. Lumbar facet medial branch nerve blocks were discussed with the patient.  Risks and benefits were reviewed.  Patient would like to proceed with bilateral L3, L4, L5 medial branch nerve block.  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 Perform a cervical epidural injection to target right-sided neck and shoulder pain.  Future considerations could also include diagnostic cervical facet medial branch nerve blocks  Right shoulder pain: Could be referred pain from cervical radiculopathy, cervical facet joint syndrome or primary shoulder etiology.  Will see how patient does with cervical ESI potentially cervical facet medial branch nerve block.  Interventions for right shoulder pain could include right  suprascapular nerve block.  Coordinate with the cardiologist to stop Eliquis  three days prior to the procedure and substitute with baby aspirin . Schedule follow-up to assess response to injections.   Chronic pain syndrome: Patient requesting prescription for hydrocodone.  I informed him that our goals are to focus on interventional pain management and nonopioid based medication management.  We will obtain a baseline urine toxicology screen today which is customary for new patients if we need to escalate medication management in the future.      Lab Orders         Compliance Drug Analysis, Ur      Procedure Orders         LUMBAR FACET(MEDIAL BRANCH NERVE BLOCK) MBNB         Cervical Epidural Injection      Provider-requested follow-up: Return in about 20 days (around 08/12/2024) for B/L L3, 4, 5, MBNB + C-ESI, in clinic IV Versed  (stop Eliquis  3 days prior).  Future Appointments  Date Time Provider Department Center  08/08/2024  9:15 AM Fernand Denyse LABOR, MD AMA-AMA None   I discussed the assessment and treatment plan with the patient. The patient was provided an opportunity to ask questions and all were answered. The patient agreed with the plan and demonstrated an understanding of the instructions.  Patient advised to call back or seek an in-person evaluation if the symptoms or condition worsens.  Duration of encounter: .  Total time on encounter, as per AMA guidelines included both the face-to-face and non-face-to-face time personally spent by the physician and/or other qualified health care professional(s) on the day of the encounter (includes time in activities that require  the physician or other qualified health care professional and does not include time in activities normally performed by clinical staff). Physician's time may include the following activities when performed: Preparing to see the patient (e.g., pre-charting review of records, searching for previously ordered  imaging, lab work, and nerve conduction tests) Review of prior analgesic pharmacotherapies. Reviewing PMP Interpreting ordered tests (e.g., lab work, imaging, nerve conduction tests) Performing post-procedure evaluations, including interpretation of diagnostic procedures Obtaining and/or reviewing separately obtained history Performing a medically appropriate examination and/or evaluation Counseling and educating the patient/family/caregiver Ordering medications, tests, or procedures Referring and communicating with other health care professionals (when not separately reported) Documenting clinical information in the electronic or other health record Independently interpreting results (not separately reported) and communicating results to the patient/ family/caregiver Care coordination (not separately reported)  Note by: Wallie Sherry, MD (TTS and AI technology used. I apologize for any typographical errors that were not detected and corrected.) Date: 07/23/2024; Time: 11:24 AM

## 2024-07-23 NOTE — ED Triage Notes (Signed)
 Pt reports shortness of breath and chest tightness that began a few hours PTA, pt reports sob when laying down. Pt has hx CHF, denies new cough congestion or fever.

## 2024-07-23 NOTE — Patient Instructions (Addendum)
 GENERAL RISKS AND COMPLICATIONS  What are the risk, side effects and possible complications? Generally speaking, most procedures are safe.  However, with any procedure there are risks, side effects, and the possibility of complications.  The risks and complications are dependent upon the sites that are lesioned, or the type of nerve block to be performed.  The closer the procedure is to the spine, the more serious the risks are.  Great care is taken when placing the radio frequency needles, block needles or lesioning probes, but sometimes complications can occur. Infection: Any time there is an injection through the skin, there is a risk of infection.  This is why sterile conditions are used for these blocks.  There are four possible types of infection. Localized skin infection. Central Nervous System Infection-This can be in the form of Meningitis, which can be deadly. Epidural Infections-This can be in the form of an epidural abscess, which can cause pressure inside of the spine, causing compression of the spinal cord with subsequent paralysis. This would require an emergency surgery to decompress, and there are no guarantees that the patient would recover from the paralysis. Discitis-This is an infection of the intervertebral discs.  It occurs in about 1% of discography procedures.  It is difficult to treat and it may lead to surgery.        2. Pain: the needles have to go through skin and soft tissues, will cause soreness.       3. Damage to internal structures:  The nerves to be lesioned may be near blood vessels or    other nerves which can be potentially damaged.       4. Bleeding: Bleeding is more common if the patient is taking blood thinners such as  aspirin , Coumadin, Ticiid, Plavix, etc., or if he/she have some genetic predisposition  such as hemophilia. Bleeding into the spinal canal can cause compression of the spinal  cord with subsequent paralysis.  This would require an emergency  surgery to  decompress and there are no guarantees that the patient would recover from the  paralysis.       5. Pneumothorax:  Puncturing of a lung is a possibility, every time a needle is introduced in  the area of the chest or upper back.  Pneumothorax refers to free air around the  collapsed lung(s), inside of the thoracic cavity (chest cavity).  Another two possible  complications related to a similar event would include: Hemothorax and Chylothorax.   These are variations of the Pneumothorax, where instead of air around the collapsed  lung(s), you may have blood or chyle, respectively.       6. Spinal headaches: They may occur with any procedures in the area of the spine.       7. Persistent CSF (Cerebro-Spinal Fluid) leakage: This is a rare problem, but may occur  with prolonged intrathecal or epidural catheters either due to the formation of a fistulous  track or a dural tear.       8. Nerve damage: By working so close to the spinal cord, there is always a possibility of  nerve damage, which could be as serious as a permanent spinal cord injury with  paralysis.       9. Death:  Although rare, severe deadly allergic reactions known as Anaphylactic  reaction can occur to any of the medications used.      10. Worsening of the symptoms:  We can always make thing worse.  What are the chances  of something like this happening? Chances of any of this occuring are extremely low.  By statistics, you have more of a chance of getting killed in a motor vehicle accident: while driving to the hospital than any of the above occurring .  Nevertheless, you should be aware that they are possibilities.  In general, it is similar to taking a shower.  Everybody knows that you can slip, hit your head and get killed.  Does that mean that you should not shower again?  Nevertheless always keep in mind that statistics do not mean anything if you happen to be on the wrong side of them.  Even if a procedure has a 1 (one) in a  1,000,000 (million) chance of going wrong, it you happen to be that one..Also, keep in mind that by statistics, you have more of a chance of having something go wrong when taking medications.  Who should not have this procedure? If you are on a blood thinning medication (e.g. Coumadin, Plavix, see list of Blood Thinners), or if you have an active infection going on, you should not have the procedure.  If you are taking any blood thinners, please inform your physician.  How should I prepare for this procedure? Do not eat or drink anything at least six hours prior to the procedure. Bring a driver with you .  It cannot be a taxi. Come accompanied by an adult that can drive you back, and that is strong enough to help you if your legs get weak or numb from the local anesthetic. Take all of your medicines the morning of the procedure with just enough water to swallow them. If you have diabetes, make sure that you are scheduled to have your procedure done first thing in the morning, whenever possible. If you have diabetes, take only half of your insulin dose and notify our nurse that you have done so as soon as you arrive at the clinic. If you are diabetic, but only take blood sugar pills (oral hypoglycemic), then do not take them on the morning of your procedure.  You may take them after you have had the procedure. Do not take aspirin  or any aspirin -containing medications, at least eleven (11) days prior to the procedure.  They may prolong bleeding. Wear loose fitting clothing that may be easy to take off and that you would not mind if it got stained with Betadine or blood. Do not wear any jewelry or perfume Remove any nail coloring.  It will interfere with some of our monitoring equipment.  NOTE: Remember that this is not meant to be interpreted as a complete list of all possible complications.  Unforeseen problems may occur.  BLOOD THINNERS The following drugs contain aspirin  or other products,  which can cause increased bleeding during surgery and should not be taken for 2 weeks prior to and 1 week after surgery.  If you should need take something for relief of minor pain, you may take acetaminophen  which is found in Tylenol ,m Datril, Anacin-3 and Panadol. It is not blood thinner. The products listed below are.  Do not take any of the products listed below in addition to any listed on your instruction sheet.  A.P.C or A.P.C with Codeine Codeine Phosphate Capsules #3 Ibuprofen Ridaura  ABC compound Congesprin Imuran rimadil  Advil Cope Indocin Robaxisal  Alka-Seltzer Effervescent Pain Reliever and Antacid Coricidin or Coricidin-D  Indomethacin Rufen  Alka-Seltzer plus Cold Medicine Cosprin Ketoprofen S-A-C Tablets  Anacin Analgesic Tablets or Capsules Coumadin  Korlgesic Salflex  Anacin Extra Strength Analgesic tablets or capsules CP-2 Tablets Lanoril Salicylate  Anaprox Cuprimine Capsules Levenox Salocol  Anexsia-D Dalteparin Magan Salsalate  Anodynos Darvon compound Magnesium Salicylate Sine-off  Ansaid Dasin Capsules Magsal Sodium Salicylate  Anturane Depen Capsules Marnal Soma  APF Arthritis pain formula Dewitt's Pills Measurin  Stanback  Argesic Dia-Gesic Meclofenamic Sulfinpyrazone  Arthritis Bayer Timed Release Aspirin  Diclofenac Meclomen Sulindac  Arthritis pain formula Anacin Dicumarol Medipren Supac  Analgesic (Safety coated) Arthralgen Diffunasal Mefanamic Suprofen  Arthritis Strength Bufferin Dihydrocodeine Mepro Compound Suprol  Arthropan liquid Dopirydamole Methcarbomol with Aspirin  Synalgos  ASA tablets/Enseals Disalcid Micrainin Tagament  Ascriptin Doan's Midol Talwin  Ascriptin A/D Dolene Mobidin Tanderil  Ascriptin Extra Strength Dolobid Moblgesic Ticlid  Ascriptin with Codeine Doloprin or Doloprin with Codeine Momentum Tolectin  Asperbuf Duoprin Mono-gesic Trendar  Aspergum Duradyne Motrin or Motrin IB Triminicin  Aspirin  plain, buffered or enteric coated  Durasal Myochrisine Trigesic  Aspirin  Suppositories Easprin Nalfon Trillsate  Aspirin  with Codeine Ecotrin Regular or Extra Strength Naprosyn Uracel  Atromid-S Efficin Naproxen Ursinus  Auranofin Capsules Elmiron Neocylate Vanquish  Axotal Emagrin Norgesic Verin  Azathioprine Empirin or Empirin with Codeine Normiflo Vitamin E  Azolid Emprazil Nuprin Voltaren  Bayer Aspirin  plain, buffered or children's or timed BC Tablets or powders Encaprin Orgaran Warfarin Sodium  Buff-a-Comp Enoxaparin  Orudis Zorpin  Buff-a-Comp with Codeine Equegesic Os-Cal-Gesic   Buffaprin Excedrin plain, buffered or Extra Strength Oxalid   Bufferin Arthritis Strength Feldene Oxphenbutazone   Bufferin plain or Extra Strength Feldene Capsules Oxycodone with Aspirin    Bufferin with Codeine Fenoprofen Fenoprofen Pabalate or Pabalate-SF   Buffets II Flogesic Panagesic   Buffinol plain or Extra Strength Florinal or Florinal with Codeine Panwarfarin   Buf-Tabs Flurbiprofen Penicillamine   Butalbital Compound Four-way cold tablets Penicillin   Butazolidin Fragmin Pepto-Bismol   Carbenicillin Geminisyn Percodan   Carna Arthritis Reliever Geopen Persantine   Carprofen Gold's salt Persistin   Chloramphenicol Goody's Phenylbutazone   Chloromycetin Haltrain Piroxlcam   Clmetidine heparin  Plaquenil   Cllnoril Hyco-pap Ponstel   Clofibrate Hydroxy chloroquine Propoxyphen         Before stopping any of these medications, be sure to consult the physician who ordered them.  Some, such as Coumadin (Warfarin) are ordered to prevent or treat serious conditions such as deep thrombosis, pumonary embolisms, and other heart problems.  The amount of time that you may need off of the medication may also vary with the medication and the reason for which you were taking it.  If you are taking any of these medications, please make sure you notify your pain physician before you undergo any procedures.         Moderate Conscious  Sedation, Adult Sedation is the use of medicines to help you relax and not feel pain. Moderate conscious sedation is a type of sedation that makes you less alert than normal. You are still able to respond to instructions, touch, or both. This type of sedation is used during short medical and dental procedures. It is milder than deep sedation, which is a type of sedation you cannot be easily woken up from. It is also milder than general anesthesia, which is the use of medicines to make you fall asleep. Moderate conscious sedation lets you return to your normal activities sooner. Tell a health care provider about: Any allergies you have. All medicines you are taking, including vitamins, herbs, steroids, eye drops, creams, and over-the-counter medicines. Any problems you or family members have had with anesthesia.  Any bleeding problems you have. Any surgeries you have had. Any medical conditions you have. Whether you are pregnant or may be pregnant. Any recent alcohol, tobacco, or drug use. What are the risks? Your health care provider will talk with you about risks. These may include: Oversedation. This is when you get too much medicine. Nausea or vomiting. Allergic reaction to medicines. Trouble breathing. If this happens, a breathing tube may be used. It will be removed when you can breathe better on your own. Heart trouble. Lung trouble. Emergence delirium. This is when you feel confused while the sedation wears off. This gets better with time. What happens before the procedure? When to stop eating and drinking Follow instructions from your health care provider about what you may eat and drink. These may include: 8 hours before your procedure Stop eating most foods. Do not eat meat, fried foods, or fatty foods. Eat only light foods, such as toast or crackers. All liquids are okay except energy drinks and alcohol. 6 hours before your procedure Stop eating. Drink only clear liquids, such  as water, clear fruit juice, black coffee, plain tea, and sports drinks. Do not drink energy drinks or alcohol. 2 hours before your procedure Stop drinking all liquids. You may be allowed to take medicines with small sips of water. If you do not follow your health care provider's instructions, your procedure may be delayed or canceled. Medicines Ask your health care provider about: Changing or stopping your regular medicines. These include any diabetes medicines or blood thinners you take. Taking medicines such as aspirin  and ibuprofen. These medicines can thin your blood. Do not take them unless your health care provider tells you to. Taking over-the-counter medicines, vitamins, herbs, and supplements. Tests and exams You may have an exam or testing. You may have a blood or urine sample taken. General instructions Do not use any products that contain nicotine or tobacco for at least 4 weeks before the procedure. These products include cigarettes, chewing tobacco, and vaping devices, such as e-cigarettes. If you need help quitting, ask your health care provider. If you will be going home right after the procedure, plan to have a responsible adult: Take you home from the hospital or clinic. You will not be allowed to drive. Care for you for the time you are told. What happens during the procedure?  You will be given the sedative. It may be given: As a pill you can take by mouth. It can also be put into the rectum. As a spray through the nose. As an injection into muscle. As an injection into a vein through an IV. You may be given oxygen as needed. Your blood pressure, heart rate, breathing rate, and blood oxygen level will be monitored during the procedure. The medical or dental procedure will be done. The procedure may vary among health care providers and hospitals. What happens after the procedure? Your blood pressure, heart rate, breathing rate, and blood oxygen level will be  monitored until you leave the hospital or clinic. You will get fluids through an IV as needed. Do not drive or operate machinery until your health care provider says that it is safe. This information is not intended to replace advice given to you by your health care provider. Make sure you discuss any questions you have with your health care provider. Document Revised: 05/02/2022 Document Reviewed: 05/02/2022 Elsevier Patient Education  2024 [employer]Facet Blocks Patient Information  Description: The facets are joints in the spine between the  vertebrae.  Like any joints in the body, facets can become irritated and painful.  Arthritis can also effect the facets.  By injecting steroids and local anesthetic in and around these joints, we can temporarily block the nerve supply to them.  Steroids act directly on irritated nerves and tissues to reduce selling and inflammation which often leads to decreased pain.  Facet blocks may be done anywhere along the spine from the neck to the low back depending upon the location of your pain.   After numbing the skin with local anesthetic (like Novocaine), a small needle is passed onto the facet joints under x-ray guidance.  You may experience a sensation of pressure while this is being done.  The entire block usually lasts about 15-25 minutes.   Conditions which may be treated by facet blocks:  Low back/buttock pain Neck/shoulder pain Certain types of headaches  Preparation for the injection:  Do not eat any solid food or dairy products within 8 hours of your appointment. You may drink clear liquid up to 3 hours before appointment.  Clear liquids include water, black coffee, juice or soda.  No milk or cream please. You may take your regular medication, including pain medications, with a sip of water before your appointment.  Diabetics should hold regular insulin (if taken separately) and take 1/2 normal NPH dose the morning of the procedure.  Carry some  sugar containing items with you to your appointment. A driver must accompany you and be prepared to drive you home after your procedure. Bring all your current medications with you. An IV may be inserted and sedation may be given at the discretion of the physician. A blood pressure cuff, EKG and other monitors will often be applied during the procedure.  Some patients may need to have extra oxygen administered for a short period. You will be asked to provide medical information, including your allergies and medications, prior to the procedure.  We must know immediately if you are taking blood thinners (like Coumadin/Warfarin) or if you are allergic to IV iodine contrast (dye).  We must know if you could possible be pregnant.  Possible side-effects:  Bleeding from needle site Infection (rare, may require surgery) Nerve injury (rare) Numbness & tingling (temporary) Difficulty urinating (rare, temporary) Spinal headache (a headache worse with upright posture) Light-headedness (temporary) Pain at injection site (serveral days) Decreased blood pressure (rare, temporary) Weakness in arm/leg (temporary) Pressure sensation in back/neck (temporary)   Call if you experience:  Fever/chills associated with headache or increased back/neck pain Headache worsened by an upright position New onset, weakness or numbness of an extremity below the injection site Hives or difficulty breathing (go to the emergency room) Inflammation or drainage at the injection site(s) Severe back/neck pain greater than usual New symptoms which are concerning to you  Please note:  Although the local anesthetic injected can often make your back or neck feel good for several hours after the injection, the pain will likely return. It takes 3-7 days for steroids to work.  You may not notice any pain relief for at least one week.  If effective, we will often do a series of 2-3 injections spaced 3-6 weeks apart to maximally  decrease your pain.  After the initial series, you may be a candidate for a more permanent nerve block of the facets.  If you have any questions, please call #[phone] [facility] Clinic[facility] Patient Information  Description: The epidural space surrounds the nerves  as they exit the spinal cord.  In some patients, the nerves can be compressed and inflamed by a bulging disc or a tight spinal canal (spinal stenosis).  By injecting steroids into the epidural space, we can bring irritated nerves into direct contact with a potentially helpful medication.  These steroids act directly on the irritated nerves and can reduce swelling and inflammation which often leads to decreased pain.  Epidural steroids may be injected anywhere along the spine and from the neck to the low back depending upon the location of your pain.   After numbing the skin with local anesthetic (like Novocaine), a small needle is passed into the epidural space slowly.  You may experience a sensation of pressure while this is being done.  The entire block usually last less than 10 minutes.  Conditions which may be treated by epidural steroids:  Low back and leg pain Neck and arm pain Spinal stenosis Post-laminectomy syndrome Herpes zoster (shingles) pain Pain from compression fractures  Preparation for the injection:  Do not eat any solid food or dairy products within 8 hours of your appointment.  You may drink clear liquids up to 3 hours before appointment.  Clear liquids include water, black coffee, juice or soda.  No milk or cream please. You may take your regular medication, including pain medications, with a sip of water before your appointment  Diabetics should hold regular insulin (if taken separately) and take 1/2 normal NPH dos the morning of the procedure.  Carry some sugar containing items with you to your appointment. A driver must accompany you and be prepared to drive  you home after your procedure.  Bring all your current medications with your. An IV may be inserted and sedation may be given at the discretion of the physician.   A blood pressure cuff, EKG and other monitors will often be applied during the procedure.  Some patients may need to have extra oxygen administered for a short period. You will be asked to provide medical information, including your allergies, prior to the procedure.  We must know immediately if you are taking blood thinners (like Coumadin/Warfarin)  Or if you are allergic to IV iodine contrast (dye). We must know if you could possible be pregnant.  Possible side-effects: Bleeding from needle site Infection (rare, may require surgery) Nerve injury (rare) Numbness & tingling (temporary) Difficulty urinating (rare, temporary) Spinal headache ( a headache worse with upright posture) Light -headedness (temporary) Pain at injection site (several days) Decreased blood pressure (temporary) Weakness in arm/leg (temporary) Pressure sensation in back/neck (temporary)  Call if you experience: Fever/chills associated with headache or increased back/neck pain. Headache worsened by an upright position. New onset weakness or numbness of an extremity below the injection site Hives or difficulty breathing (go to the emergency room) Inflammation or drainage at the infection site Severe back/neck pain Any new symptoms which are concerning to you  Please note:  Although the local anesthetic injected can often make your back or neck feel good for several hours after the injection, the pain will likely return.  It takes 3-7 days for steroids to work in the epidural space.  You may not notice any pain relief for at least that one week.  If effective, we will often do a series of three injections spaced 3-6 weeks apart to maximally decrease your pain.  After the initial series, we generally will wait several months before considering a repeat  injection of the same type.  If you have  any questions, please call [phone] [facility] ClinicLumbar facet medial branch nerve block for low back pain due to arthritis   Cervical epidural injection for your neck  Stop blood thinner 3 days prior, take 81 ASA mg instead- get cardiac clearance

## 2024-07-24 ENCOUNTER — Telehealth: Payer: Self-pay

## 2024-07-24 NOTE — Telephone Encounter (Signed)
 Yes we need clearance and Devere sent it via inbox yesterday to Dr Fernand.  We are waiting.

## 2024-07-24 NOTE — Telephone Encounter (Signed)
 Do you need to get clearance for him to stop the eliquis  for his procedure? I have the auth.

## 2024-07-26 LAB — COMPLIANCE DRUG ANALYSIS, UR

## 2024-07-29 ENCOUNTER — Telehealth: Payer: Self-pay

## 2024-07-29 NOTE — Telephone Encounter (Signed)
 Any update on this clearance? I have the authorization for the procedure.

## 2024-07-29 NOTE — Telephone Encounter (Signed)
 Dr Marcelino attempting to get clearance for this patient to stop his Eliquis  for 3 days prior to having a procedure.  Thank you for your time.

## 2024-08-08 ENCOUNTER — Ambulatory Visit: Admitting: Cardiovascular Disease

## 2024-08-09 ENCOUNTER — Ambulatory Visit: Admitting: Cardiovascular Disease

## 2024-08-09 ENCOUNTER — Encounter: Payer: Self-pay | Admitting: Cardiovascular Disease

## 2024-08-09 VITALS — BP 100/54 | HR 102 | Ht 74.0 in | Wt 296.8 lb

## 2024-08-09 DIAGNOSIS — I10 Essential (primary) hypertension: Secondary | ICD-10-CM | POA: Diagnosis not present

## 2024-08-09 DIAGNOSIS — I4711 Inappropriate sinus tachycardia, so stated: Secondary | ICD-10-CM

## 2024-08-09 DIAGNOSIS — I5021 Acute systolic (congestive) heart failure: Secondary | ICD-10-CM | POA: Diagnosis not present

## 2024-08-09 DIAGNOSIS — I519 Heart disease, unspecified: Secondary | ICD-10-CM | POA: Diagnosis not present

## 2024-08-09 DIAGNOSIS — I480 Paroxysmal atrial fibrillation: Secondary | ICD-10-CM

## 2024-08-09 DIAGNOSIS — R0602 Shortness of breath: Secondary | ICD-10-CM | POA: Diagnosis not present

## 2024-08-09 MED ORDER — METOPROLOL SUCCINATE ER 200 MG PO TB24: 200.0000 mg | ORAL_TABLET | Freq: Every day | ORAL | 11 refills | Status: AC

## 2024-08-09 NOTE — Progress Notes (Signed)
 Cardiology Office Note   Date:  08/09/2024   ID:  Rustyn Conery, DOB [birth year], MRN [id number]  PCP:  [name], NP  Cardiologist:  Denyse Bathe, MD      History of Present Illness: Ronald Horne is a 73 y.o. male who presents for  Chief Complaint  Patient presents with   Follow-up    4 week follow up    HPI    Past Medical History:  Diagnosis Date   Arthritis    Cardiomyopathy (HCC)    a. 08/2016 Echo: EF 30-35%, diff HK; b. 03/2018 Ex MV ([facility]): Ex time 2:00, large anter defect, EF 24%; c. 03/2018 Echo: EF 20-25%, diff HK. Gr1 DD; d. Refused cath.   Chronic back pain    Chronic combined systolic (congestive) and diastolic (congestive) heart failure (HCC)    a. 08/2016 Echo: EF 30-35%, diff HK, mild to mod MR, mild BAE; b. 03/2018 Echo: EF 20-25%, diff HK. Gr1 DD. MIldly dil Ao root. Mild BAE. Mildly reduced RV fxn.   COPD (chronic obstructive pulmonary disease) (HCC)    GERD (gastroesophageal reflux disease)    Gout    Hypertension    Obesity    Sleep apnea      Past Surgical History:  Procedure Laterality Date   EPIDURAL BLOCK INJECTION     chronic back pain    LEFT HEART CATH AND CORONARY ANGIOGRAPHY Left 12/27/2019   Procedure: LEFT HEART CATH AND CORONARY ANGIOGRAPHY;  Surgeon: Bathe Denyse LABOR, MD;  Location: [facility];  Service: Cardiovascular;  Laterality: Left;   TONSILLECTOMY       Current Outpatient Medications  Medication Sig Dispense Refill   metoprolol  (TOPROL  XL) 200 MG 24 hr tablet Take 1 tablet (200 mg total) by mouth daily. 30 tablet 11   albuterol  (VENTOLIN  HFA) 108 (90 Base) MCG/ACT inhaler Inhale 1-2 puffs into the lungs every 6 (six) hours as needed for shortness of breath or wheezing.     amiodarone  (PACERONE ) 200 MG tablet Take 1 tablet (200 mg total) by mouth daily. 30 tablet 2   APPLE CIDER VINEGAR PO Take 15 mLs by mouth in the morning and at bedtime.     atorvastatin  (LIPITOR) 20 MG tablet Take 20 mg by mouth in  the morning.     digoxin  (LANOXIN ) 0.25 MG tablet Take 1 tablet (250 mcg total) by mouth daily. 30 tablet 11   ELIQUIS  5 MG TABS tablet TAKE 1 TABLET TWICE A DAY 60 tablet 0   ENTRESTO  97-103 MG TAKE 1 TABLET BY MOUTH TWICE A DAY 60 tablet 6   famotidine (PEPCID) 40 MG tablet Take 40 mg by mouth.     FARXIGA  10 MG TABS tablet TAKE 1 TABLET DAILY 30 tablet 6   HYDROcodone-acetaminophen  (NORCO/VICODIN) 5-325 MG tablet Take 1 tablet by mouth every 4 (four) hours as needed.     methimazole  (TAPAZOLE ) 5 MG tablet Take 5 mg by mouth in the morning.     Multiple Vitamin (MULTIVITAMIN WITH MINERALS) TABS tablet Take 1 tablet by mouth daily.     omeprazole (PRILOSEC) 20 MG capsule Take 20 mg by mouth daily.     spironolactone  (ALDACTONE ) 25 MG tablet TAKE 1/2 TABLET BY MOUTH DAILY 15 tablet 6   torsemide  (DEMADEX ) 10 MG tablet Take 1 tablet (10 mg total) by mouth 2 (two) times daily. 60 tablet 11   No current facility-administered medications for this visit.    Allergies:   Patient has  no known allergies.    Social History:   reports that he quit smoking about 47 years ago. His smoking use included cigarettes. He started smoking about 48 years ago. He has a 3 pack-year smoking history. He has never used smokeless tobacco. He reports that he does not drink alcohol and does not use drugs.   Family History:  family history is not on file.    ROS:     Review of Systems  Constitutional: Negative.   HENT: Negative.    Eyes: Negative.   Respiratory: Negative.    Gastrointestinal: Negative.   Genitourinary: Negative.   Musculoskeletal: Negative.   Skin: Negative.   Neurological: Negative.   Endo/Heme/Allergies: Negative.   Psychiatric/Behavioral: Negative.    All other systems reviewed and are negative.     All other systems are reviewed and negative.    PHYSICAL EXAM: VS:  BP (!) 100/54   Pulse (!) 102   Ht 6' 2 (1.88 m)   Wt 296 lb 12.8 oz (134.6 kg)   SpO2 98%   BMI 38.11 kg/m   , BMI Body mass index is 38.11 kg/m. Last weight:  Wt Readings from Last 3 Encounters:  08/09/24 296 lb 12.8 oz (134.6 kg)  07/23/24 295 lb (133.8 kg)  07/23/24 295 lb (133.8 kg)     Physical Exam Vitals reviewed.  Constitutional:      Appearance: Normal appearance. He is normal weight.  HENT:     Head: Normocephalic.     Nose: Nose normal.     Mouth/Throat:     Mouth: Mucous membranes are moist.  Eyes:     Pupils: Pupils are equal, round, and reactive to light.  Cardiovascular:     Rate and Rhythm: Normal rate and regular rhythm.     Pulses: Normal pulses.     Heart sounds: Normal heart sounds.  Pulmonary:     Effort: Pulmonary effort is normal.  Abdominal:     General: Abdomen is flat. Bowel sounds are normal.  Musculoskeletal:        General: Normal range of motion.     Cervical back: Normal range of motion.  Skin:    General: Skin is warm.  Neurological:     General: No focal deficit present.     Mental Status: He is alert.  Psychiatric:        Mood and Affect: Mood normal.       EKG:   Recent Labs: 04/10/2024: ALT 19 05/23/2024: Magnesium 1.8 07/23/2024: B Natriuretic Peptide 717.7; BUN 17; Creatinine, Ser 1.06; Hemoglobin 15.1; Platelets 209; Potassium 3.3; Sodium 137    Lipid Panel No results found for: CHOL, TRIG, HDL, CHOLHDL, VLDL, LDLCALC, LDLDIRECT    Other studies Reviewed: Additional studies/ records that were reviewed today include:  Review of the above records demonstrates:      04/12/2018   11:13 AM  PAD Screen  Previous PAD dx? No  Previous surgical procedure? No  Pain with walking? No  Feet/toe relief with dangling? No  Painful, non-healing ulcers? No  Extremities discolored? No      ASSESSMENT AND PLAN:    ICD-10-CM   1. Acute HFrEF (heart failure with reduced ejection fraction) (HCC)  I50.21 metoprolol  (TOPROL  XL) 200 MG 24 hr tablet    PCV ECHOCARDIOGRAM COMPLETE    2. Cardiopathy  I51.9 metoprolol  (TOPROL   XL) 200 MG 24 hr tablet    PCV ECHOCARDIOGRAM COMPLETE    3. SOB (shortness of breath)  R06.02 metoprolol  (  TOPROL  XL) 200 MG 24 hr tablet    PCV ECHOCARDIOGRAM COMPLETE   Went to ER 07/23/24, EKG had Sinus tachycardia.    4. Paroxysmal atrial fibrillation (HCC)  I48.0 metoprolol  (TOPROL  XL) 200 MG 24 hr tablet    PCV ECHOCARDIOGRAM COMPLETE    5. Primary hypertension  I10 metoprolol  (TOPROL  XL) 200 MG 24 hr tablet    PCV ECHOCARDIOGRAM COMPLETE    6. Inappropriate sinus tachycardia  I47.11 metoprolol  (TOPROL  XL) 200 MG 24 hr tablet    PCV ECHOCARDIOGRAM COMPLETE   Increase metoprolol  dose 200       Problem List Items Addressed This Visit       Cardiovascular and Mediastinum   Acute HFrEF (heart failure with reduced ejection fraction) (HCC) - Primary   Relevant Medications   metoprolol  (TOPROL  XL) 200 MG 24 hr tablet   Other Relevant Orders   PCV ECHOCARDIOGRAM COMPLETE   Hypertension   Relevant Medications   metoprolol  (TOPROL  XL) 200 MG 24 hr tablet   Other Relevant Orders   PCV ECHOCARDIOGRAM COMPLETE   Cardiopathy   Relevant Medications   metoprolol  (TOPROL  XL) 200 MG 24 hr tablet   Other Relevant Orders   PCV ECHOCARDIOGRAM COMPLETE     Other   SOB (shortness of breath)   Relevant Medications   metoprolol  (TOPROL  XL) 200 MG 24 hr tablet   Other Relevant Orders   PCV ECHOCARDIOGRAM COMPLETE   Other Visit Diagnoses       Paroxysmal atrial fibrillation (HCC)       Relevant Medications   metoprolol  (TOPROL  XL) 200 MG 24 hr tablet   Other Relevant Orders   PCV ECHOCARDIOGRAM COMPLETE     Inappropriate sinus tachycardia       Increase metoprolol  dose 200   Relevant Medications   metoprolol  (TOPROL  XL) 200 MG 24 hr tablet   Other Relevant Orders   PCV ECHOCARDIOGRAM COMPLETE          Disposition:   Return in about 3 weeks (around 08/30/2024) for echo and f/u.    Total time spent: 35 minutes  Signed,  Denyse Bathe, MD  08/09/2024 9:45 AM     [facility]

## 2024-08-12 ENCOUNTER — Encounter: Payer: Self-pay | Admitting: Student in an Organized Health Care Education/Training Program

## 2024-08-12 ENCOUNTER — Ambulatory Visit (HOSPITAL_BASED_OUTPATIENT_CLINIC_OR_DEPARTMENT_OTHER): Admitting: Student in an Organized Health Care Education/Training Program

## 2024-08-12 ENCOUNTER — Ambulatory Visit
Admission: RE | Admit: 2024-08-12 | Discharge: 2024-08-12 | Disposition: A | Discharge status: Home / Self Care | Source: Ambulatory Visit | Attending: Student in an Organized Health Care Education/Training Program | Admitting: Student in an Organized Health Care Education/Training Program

## 2024-08-12 VITALS — BP 95/79 | HR 107 | Temp 97.7°F | Resp 18 | Ht 74.0 in | Wt 294.0 lb

## 2024-08-12 DIAGNOSIS — M47816 Spondylosis without myelopathy or radiculopathy, lumbar region: Secondary | ICD-10-CM | POA: Insufficient documentation

## 2024-08-12 DIAGNOSIS — M5412 Radiculopathy, cervical region: Secondary | ICD-10-CM

## 2024-08-12 DIAGNOSIS — G894 Chronic pain syndrome: Secondary | ICD-10-CM

## 2024-08-12 MED ORDER — DEXAMETHASONE SOD PHOSPHATE PF 10 MG/ML IJ SOLN
10.0000 mg | Freq: Once | INTRAMUSCULAR | Status: AC
  Administered 2024-08-12: 10 mg

## 2024-08-12 MED ORDER — LACTATED RINGERS IV SOLN: Freq: Once | INTRAVENOUS | Status: AC

## 2024-08-12 MED ORDER — DEXAMETHASONE SOD PHOSPHATE PF 10 MG/ML IJ SOLN
20.0000 mg | Freq: Once | INTRAMUSCULAR | Status: AC
  Administered 2024-08-12: 20 mg

## 2024-08-12 MED ORDER — IOPAMIDOL (ISOVUE-M 200) INJECTION 41%: 10.0000 mL | Freq: Once | INTRAMUSCULAR | Status: DC

## 2024-08-12 MED ORDER — ROPIVACAINE HCL 2 MG/ML IJ SOLN
18.0000 mL | Freq: Once | INTRAMUSCULAR | Status: AC
  Administered 2024-08-12: 18 mL via PERINEURAL
  Filled 2024-08-12: qty 20

## 2024-08-12 MED ORDER — SODIUM CHLORIDE 0.9% FLUSH
2.0000 mL | Freq: Once | INTRAVENOUS | Status: AC
  Administered 2024-08-12: 2 mL

## 2024-08-12 MED ORDER — ROPIVACAINE HCL 2 MG/ML IJ SOLN
2.0000 mL | Freq: Once | INTRAMUSCULAR | Status: AC
  Administered 2024-08-12: 2 mL via EPIDURAL
  Filled 2024-08-12: qty 20

## 2024-08-12 MED ORDER — LIDOCAINE HCL 2 % IJ SOLN
20.0000 mL | Freq: Once | INTRAMUSCULAR | Status: AC
  Administered 2024-08-12: 400 mg
  Filled 2024-08-12: qty 40

## 2024-08-12 MED ORDER — MIDAZOLAM HCL 2 MG/2ML IJ SOLN
0.5000 mg | Freq: Once | INTRAMUSCULAR | Status: AC
  Administered 2024-08-12: 2 mg via INTRAVENOUS
  Filled 2024-08-12: qty 2

## 2024-08-12 NOTE — Patient Instructions (Addendum)
 ______________________________________________________________________    Post-Procedure Discharge Instructions  INSTRUCTIONS Apply ice:  Purpose: This will minimize any swelling and discomfort after procedure.  When: Day of procedure, as soon as you get home. How: Fill a plastic sandwich bag with crushed ice. Cover it with a small towel and apply to injection site. How long: (15 min on, 15 min off) Apply for 15 minutes then remove x 15 minutes.  Repeat sequence on day of procedure, until you go to bed. Apply heat:  Purpose: To treat any soreness and discomfort from the procedure. When: Starting the next day after the procedure. How: Apply heat to procedure site starting the day following the procedure. How long: May continue to repeat daily, until discomfort goes away. Food intake: Start with clear liquids (like water) and advance to regular food, as tolerated.  Physical activities: Keep activities to a minimum for the first 8 hours after the procedure. After that, then as tolerated. Driving: If you have received any sedation, be responsible and do not drive. You are not allowed to drive for 24 hours after having sedation. Blood thinner: (Applies only to those taking blood thinners) You may restart your blood thinner 6 hours after your procedure. Insulin: (Applies only to Diabetic patients taking insulin) As soon as you can eat, you may resume your normal dosing schedule. Infection prevention: Keep procedure site clean and dry. Shower daily and clean area with soap and water.  PAIN DIARY Post-procedure Pain Diary: Extremely important that this be done correctly and accurately. Recorded information will be used to determine the next step in treatment. For the purpose of accuracy, follow these rules: Evaluate only the area treated. Do not report or include pain from an untreated area. For the purpose of this evaluation, ignore all other areas of pain, except for the treated area. After your  procedure, avoid taking a long nap and attempting to complete the pain diary after you wake up. Instead, set your alarm clock to go off every hour, on the hour, for the initial 8 hours after the procedure. Document the duration of the numbing medicine, and the relief you are getting from it. Do not go to sleep and attempt to complete it later. It will not be accurate. If you received sedation, it is likely that you were given a medication that may cause amnesia. Because of this, completing the diary at a later time may cause the information to be inaccurate. This information is needed to plan your care. Follow-up appointment: Keep your post-procedure follow-up evaluation appointment after the procedure (usually 2 weeks for most procedures, 6 weeks for radiofrequencies). DO NOT FORGET to bring you pain diary with you.   EXPECT... (What should I expect to see with my procedure?) From numbing medicine (AKA: Local Anesthetics): Numbness or decrease in pain. You may also experience some weakness, which if present, could last for the duration of the local anesthetic. Onset: Full effect within 15 minutes of injected. Duration: It will depend on the type of local anesthetic used. On the average, 1 to 8 hours.  From steroids (Applies only if steroids were used): Decrease in swelling or inflammation. Once inflammation is improved, relief of the pain will follow. Onset of benefits: Depends on the amount of swelling present. The more swelling, the longer it will take for the benefits to be seen. In some cases, up to 10 days. Duration: Steroids will stay in the system x 2 weeks. Duration of benefits will depend on multiple posibilities including persistent irritating  factors. Side-effects: If present, they may typically last 2 weeks (the duration of the steroids). Frequent: Cramps (if they occur, drink Gatorade and take over-the-counter Magnesium 450-500 mg once to twice a day); water retention with temporary weight  gain; increases in blood sugar; decreased immune system response; increased appetite. Occasional: Facial flushing (red, warm cheeks); mood swings; menstrual changes. Uncommon: Long-term decrease or suppression of natural hormones; bone thinning. (These are more common with higher doses or more frequent use. This is why we prefer that our patients avoid having any injection therapies in other practices.)  Very Rare: Severe mood changes; psychosis; aseptic necrosis. From procedure: Some discomfort is to be expected once the numbing medicine wears off. This should be minimal if ice and heat are applied as instructed.  CALL IF... (When should I call?) You experience numbness and weakness that gets worse with time, as opposed to wearing off. New onset bowel or bladder incontinence. (Applies only to procedures done in the spine)  Emergency Numbers: Durning business hours (Monday - Thursday, 8:00 AM - 4:00 PM) (Friday, 9:00 AM - 12:00 Noon): [phone] After hours: [phone] NOTE: If you are having a problem and are unable connect with, or to talk to a provider, then go to your nearest urgent care or emergency department. If the problem is serious and urgent, please call 911. ______________________________________________________________________    Rosine may restart your Eliquis  tomorrow.

## 2024-08-12 NOTE — Progress Notes (Signed)
 PROVIDER NOTE: Interpretation of information contained herein should be left to medically-trained personnel. Specific patient instructions are provided elsewhere under Patient Instructions section of medical record. This document was created in part using STT-dictation technology, any transcriptional errors that may result from this process are unintentional.  Patient: Ronald Horne Type: Established DOB: 14-Oct-1951 MRN: [id number] PCP: [name], NP  Service: Procedure DOS: 08/12/2024 Setting: Ambulatory Location: Ambulatory outpatient facility Delivery: Face-to-face Provider: Wallie Sherry, MD Specialty: Interventional Pain Management Specialty designation: 09 Location: Outpatient facility Ref. Prov.: Sherry Wallie, MD       Interventional Therapy   Type: Cervical Epidural Steroid injection (CESI) (Interlaminar) #1  Laterality: Right (-RT)  Level: C7-T1 DOS: 08/12/2024  Provider: Wallie Sherry, MD Imaging: Fluoroscopy-guided Spinal ([id number]) Anesthesia: Local anesthesia (1-2% Lidocaine) Sedation: Minimal Sedation                        Patient stopped Eliquis  3 days prior  Medical Necessity Purpose: Diagnostic/Therapeutic Rationale (medical necessity): procedure needed and proper for the diagnosis and/or treatment of Ronald Horne medical symptoms and needs. Indications: Cervicalgia, cervical radicular pain, degenerative disc disease, severe enough to impact quality of life or function. Cervical radicular pain NAS-11 Pain score:   Pre-procedure: 7 /10   Post-procedure:  (6 in neck, 6 lower back)/10     Position  Prep  Materials:  Location setting: Procedure suite Position: Prone, on modified reverse trendelenburg to facilitate breathing, with head in head-cradle. Pillows positioned under chest (below chin-level) with cervical spine flexed. Safety Precautions: Patient was assessed for positional comfort and pressure points before starting the procedure. Prepping  solution: DuraPrep (Iodine Povacrylex [0.7% available iodine] and Isopropyl Alcohol, 74% w/w) Prep Area: Entire  cervicothoracic region Approach: percutaneous, paramedial Intended target: Posterior cervical epidural space Materials Procedure:  Tray: Epidural Needle(s): Epidural (Tuohy) Qty: 1 Length: (90mm) 3.5-inch Gauge: 22G  H&P (Pre-op Assessment):  Ronald Horne is a 73 y.o. (year old), male patient, seen today for interventional treatment. He  has a past surgical history that includes Tonsillectomy; Epidural block injection; and LEFT HEART CATH AND CORONARY ANGIOGRAPHY (Left, 12/27/2019). Ronald Horne has a current medication list which includes the following prescription(s): albuterol , amiodarone , apple cider vinegar, atorvastatin , digoxin , eliquis , entresto , famotidine, farxiga , fluticasone-salmeterol, hydrocodone-acetaminophen , levalbuterol, methimazole , metoprolol , multivitamin with minerals, ohtuvayre , omeprazole, potassium chloride , spironolactone , torsemide , and furosemide , and the following Facility-Administered Medications: iopamidol and lactated ringers. His primarily concern today is the Back Pain (Lower Back), Shoulder Injury, and Neck Pain  Initial Vital Signs:  Pulse/HCG Rate: (!) 107ECG Heart Rate: (!) 107 Temp: 97.7 F (36.5 C) Resp: 18 BP: 98/77 SpO2: 97 %  BMI: Estimated body mass index is 37.75 kg/m as calculated from the following:   Height as of this encounter: 6' 2 (1.88 m).   Weight as of this encounter: 294 lb (133.4 kg).  Risk Assessment: Allergies: Reviewed. He is allergic to albuterol .  Allergy Precautions: None required Coagulopathies: Reviewed. None identified.  Blood-thinner therapy: None at this time Active Infection(s): Reviewed. None identified. Ronald Horne is afebrile  Site Confirmation: Ronald Horne was asked to confirm the procedure and laterality before marking the site Procedure checklist: Completed Consent: Before the procedure and under the  influence of no sedative(s), amnesic(s), or anxiolytics, the patient was informed of the treatment options, risks and possible complications. To fulfill our ethical and legal obligations, as recommended by the American Medical Association's Code of Ethics, I have informed the patient of my clinical impression; the nature  and purpose of the treatment or procedure; the risks, benefits, and possible complications of the intervention; the alternatives, including doing nothing; the risk(s) and benefit(s) of the alternative treatment(s) or procedure(s); and the risk(s) and benefit(s) of doing nothing. The patient was provided information about the general risks and possible complications associated with the procedure. These may include, but are not limited to: failure to achieve desired goals, infection, bleeding, organ or nerve damage, allergic reactions, paralysis, and death. In addition, the patient was informed of those risks and complications associated to Spine-related procedures, such as failure to decrease pain; infection (i.e.: Meningitis, epidural or intraspinal abscess); bleeding (i.e.: epidural hematoma, subarachnoid hemorrhage, or any other type of intraspinal or peri-dural bleeding); organ or nerve damage (i.e.: Any type of peripheral nerve, nerve root, or spinal cord injury) with subsequent damage to sensory, motor, and/or autonomic systems, resulting in permanent pain, numbness, and/or weakness of one or several areas of the body; allergic reactions; (i.e.: anaphylactic reaction); and/or death. Furthermore, the patient was informed of those risks and complications associated with the medications. These include, but are not limited to: allergic reactions (i.e.: anaphylactic or anaphylactoid reaction(s)); adrenal axis suppression; blood sugar elevation that in diabetics may result in ketoacidosis or comma; water retention that in patients with history of congestive heart failure may result in shortness of  breath, pulmonary edema, and decompensation with resultant heart failure; weight gain; swelling or edema; medication-induced neural toxicity; particulate matter embolism and blood vessel occlusion with resultant organ, and/or nervous system infarction; and/or aseptic necrosis of one or more joints. Finally, the patient was informed that Medicine is not an exact science; therefore, there is also the possibility of unforeseen or unpredictable risks and/or possible complications that may result in a catastrophic outcome. The patient indicated having understood very clearly. We have given the patient no guarantees and we have made no promises. Enough time was given to the patient to ask questions, all of which were answered to the patient's satisfaction. Ronald Horne has indicated that he wanted to continue with the procedure. Attestation: I, the ordering provider, attest that I have discussed with the patient the benefits, risks, side-effects, alternatives, likelihood of achieving goals, and potential problems during recovery for the procedure that I have provided informed consent. Date  Time: 08/12/2024  8:31 AM  Pre-Procedure Preparation:  Monitoring: As per clinic protocol. Respiration, ETCO2, SpO2, BP, heart rate and rhythm monitor placed and checked for adequate function Safety Precautions: Patient was assessed for positional comfort and pressure points before starting the procedure. Time-out: I initiated and conducted the Time-out before starting the procedure, as per protocol. The patient was asked to participate by confirming the accuracy of the Time Out information. Verification of the correct person, site, and procedure were performed and confirmed by me, the nursing staff, and the patient. Time-out conducted as per Joint Commission's Universal Protocol (UP.01.01.01). Time: 1000 Start Time: 1000 hrs.  Description  Narrative of Procedure:          Rationale (medical necessity): procedure  needed and proper for the diagnosis and/or treatment of the patient's medical symptoms and needs. Start Time: 1000 hrs. Safety Precautions: Aspiration looking for blood return was conducted prior to all injections. At no point did we inject any substances, as a needle was being advanced. No attempts were made at seeking any paresthesias. Safe injection practices and needle disposal techniques used. Medications properly checked for expiration dates. SDV (single dose vial) medications used. Description of procedure: Protocol guidelines were followed. The  patient was assisted into a comfortable position. The target area was identified and the area prepped in the usual manner. Skin & deeper tissues infiltrated with local anesthetic. Appropriate amount of time allowed to pass for local anesthetics to take effect. Using fluoroscopic guidance, the epidural needle was introduced through the skin, ipsilateral to the reported pain, and advanced to the target area. Posterior laminar os was contacted and the needle walked caudad, until the lamina was cleared. The ligamentum flavum was engaged and the epidural space identified using "loss-of-resistance technique" with 2-3 ml of PF-NaCl (0.9% NSS), in a 5cc dedicated LOR syringe. (See Imaging guidance below for use of contrast details.) Once proper needle placement was secured, and negative aspiration confirmed, the solution was injected in intermittent fashion, asking for systemic symptoms every 0.5cc. The needles were then removed and the area cleansed, making sure to leave some of the prepping solution back to take advantage of its long term bactericidal properties.  3 cc solution made of 1 cc of preservative-free saline, 1 cc of 0.2% ropivacaine, 1 cc of Decadron  10 mg/cc.   Vitals:   08/12/24 1005 08/12/24 1010 08/12/24 1015 08/12/24 1024  BP: 118/82 111/78 98/70 95/79   Pulse:      Resp: 15 18 17 18   Temp:      TempSrc:      SpO2: 100% 98% 100% 100%   Weight:      Height:         End Time: 1015 hrs.  Imaging Guidance (Spinal):          Type of Imaging Technique: Fluoroscopy Guidance (Spinal) Indication(s): Fluoroscopy guidance for needle placement to enhance accuracy in procedures requiring precise needle localization for targeted delivery of medication in or near specific anatomical locations not easily accessible without such real-time imaging assistance. Exposure Time: Please see nurses notes. Contrast: Before injecting any contrast, we confirmed that the patient did not have an allergy to iodine, shellfish, or radiological contrast. Once satisfactory needle placement was completed at the desired level, radiological contrast was injected. Contrast injected under live fluoroscopy. No contrast complications. See chart for type and volume of contrast used. Fluoroscopic Guidance: I was personally present during the use of fluoroscopy. Tunnel Vision Technique used to obtain the best possible view of the target area. Parallax error corrected before commencing the procedure. Direction-depth-direction technique used to introduce the needle under continuous pulsed fluoroscopy. Once target was reached, antero-posterior, oblique, and lateral fluoroscopic projection used confirm needle placement in all planes. Images permanently stored in EMR. Interpretation: I personally interpreted the imaging intraoperatively. Adequate needle placement confirmed in multiple planes. Appropriate spread of contrast into desired area was observed. No evidence of afferent or efferent intravascular uptake. No intrathecal or subarachnoid spread observed. Permanent images saved into the patient's record.  Post-operative Assessment:  Post-procedure Vital Signs:  Pulse/HCG Rate: (!) 107(!) 104 Temp: 97.7 F (36.5 C) Resp: 18 BP: 95/79 SpO2: 100 %  EBL: None  Complications: No immediate post-treatment complications observed by team, or reported by patient.  Note:  The patient tolerated the entire procedure well. A repeat set of vitals were taken after the procedure and the patient was kept under observation following institutional policy, for this type of procedure. Post-procedural neurological assessment was performed, showing return to baseline, prior to discharge. The patient was provided with post-procedure discharge instructions, including a section on how to identify potential problems. Should any problems arise concerning this procedure, the patient was given instructions to immediately contact us , at any  time, without hesitation. In any case, we plan to contact the patient by telephone for a follow-up status report regarding this interventional procedure.  Comments:  No additional relevant information.  Plan of Care (POC)  Orders:  Orders Placed This Encounter  Procedures   DG PAIN CLINIC C-ARM 1-60 MIN NO REPORT    Intraoperative interpretation by procedural physician at [facility].    Standing Status:   Standing    Number of Occurrences:   1    Reason for exam::   Assistance in needle guidance and placement for procedures requiring needle placement in or near specific anatomical locations not easily accessible without such assistance.    Medications ordered for procedure: Meds ordered this encounter  Medications   iopamidol (ISOVUE-M) 41 % intrathecal injection 10 mL    Must be Myelogram-compatible. If not available, you may substitute with a water-soluble, non-ionic, hypoallergenic, myelogram-compatible radiological contrast medium.   lidocaine (XYLOCAINE) 2 % (with pres) injection 400 mg   lactated ringers infusion   midazolam  (VERSED ) injection 0.5-2 mg    Make sure Flumazenil is available in the pyxis when using this medication. If oversedation occurs, administer 0.2 mg IV over 15 sec. If after 45 sec no response, administer 0.2 mg again over 1 min; may repeat at 1 min intervals; not to exceed 4 doses (1 mg)   ropivacaine (PF)  2 mg/mL (0.2%) (NAROPIN) injection 18 mL   dexamethasone  (DECADRON ) injection 20 mg   ropivacaine (PF) 2 mg/mL (0.2%) (NAROPIN) injection 2 mL   sodium chloride  flush (NS) 0.9 % injection 2 mL   dexamethasone  (DECADRON ) injection 10 mg   Medications administered: We administered lidocaine, lactated ringers, midazolam , ropivacaine (PF) 2 mg/mL (0.2%), dexamethasone , ropivacaine (PF) 2 mg/mL (0.2%), sodium chloride  flush, and dexamethasone .  See the medical record for exact dosing, route, and time of administration.   Follow-up plan:   Return in about 1 week (around 08/19/2024) for PPE in person.     Recent Visits Date Type Provider Dept  07/23/24 Office Visit Marcelino Nurse, MD [facility]  Showing recent visits within past 90 days and meeting all other requirements Today's Visits Date Type Provider Dept  08/12/24 Procedure visit Marcelino Nurse, MD [facility]  Showing today's visits and meeting all other requirements Future Appointments Date Type Provider Dept  09/05/24 Appointment Marcelino Nurse, MD [facility]  Showing future appointments within next 90 days and meeting all other requirements   Disposition: Discharge home  Discharge (Date  Time): 08/12/2024; 1025 hrs.   Primary Care Physician: [name], NP Location: [facility] Note by: Nurse Marcelino, MD (TTS technology used. I apologize for any typographical errors that were not detected and corrected.) Date: 08/12/2024; Time: 10:44 AM  Disclaimer:  Medicine is not an Visual merchandiser. The only guarantee in medicine is that nothing is guaranteed. It is important to note that the decision to proceed with this intervention was based on the information collected from the patient. The Data and conclusions were drawn from the patient's questionnaire, the interview, and the physical examination. Because the information was provided in large part by the patient, it cannot be  guaranteed that it has not been purposely or unconsciously manipulated. Every effort has been made to obtain as much relevant data as possible for this evaluation. It is important to note that the conclusions that lead to this procedure are derived in large part from the available data. Always take into account that  the treatment will also be dependent on availability of resources and existing treatment guidelines, considered by other Pain Management Practitioners as being common knowledge and practice, at the time of the intervention. For Medico-Legal purposes, it is also important to point out that variation in procedural techniques and pharmacological choices are the acceptable norm. The indications, contraindications, technique, and results of the above procedure should only be interpreted and judged by a Board-Certified Interventional Pain Specialist with extensive familiarity and expertise in the same exact procedure and technique.

## 2024-08-12 NOTE — Progress Notes (Signed)
 Safety precautions to be maintained throughout the outpatient stay will include: orient to surroundings, keep bed in low position, maintain call bell within reach at all times, provide assistance with transfer out of bed and ambulation.   I called for a ride for the patient and she states she would come. Patient leaving hospital to go get his ride and bring her back.

## 2024-08-12 NOTE — Progress Notes (Signed)
 PROVIDER NOTE: Interpretation of information contained herein should be left to medically-trained personnel. Specific patient instructions are provided elsewhere under Patient Instructions section of medical record. This document was created in part using STT-dictation technology, any transcriptional errors that may result from this process are unintentional.  Patient: Ronald Horne Type: Established DOB: 07/14/1951 MRN: [id number] PCP: [name], NP  Service: Procedure DOS: 08/12/2024 Setting: Ambulatory Location: Ambulatory outpatient facility Delivery: Face-to-face Provider: Wallie Sherry, MD Specialty: Interventional Pain Management Specialty designation: 09 Location: Outpatient facility Ref. Prov.: Sherry Wallie, MD       Interventional Therapy   Type: Lumbar Facet, Medial Branch Block(s) (w/ fluoroscopic mapping) #1  Laterality: Bilateral  Level: L3, L4, and L5 Medial Branch/Dorsal Rami Level(s). Injecting these levels blocks the L3-4 and L4-5 lumbar facet joints. Imaging: Fluoroscopic guidance Spinal ([id number]) Anesthesia: Local anesthesia (1-2% Lidocaine) Sedation: Minimal Sedation                       DOS: 08/12/2024 Performed by: Wallie Sherry, MD  Patient stopped Eliquis  3 days prior  Primary Purpose: Diagnostic/Therapeutic Indications: Low back pain severe enough to impact quality of life or function. 1. Lumbar facet arthropathy   2. Lumbar spondylosis   3. Chronic pain syndrome    NAS-11 Pain score:   Pre-procedure: 7 /10   Post-procedure:  (6 in neck, 6 lower back)/10     Position / Prep / Materials:  Position: Prone  Prep solution: ChloraPrep (2% chlorhexidine gluconate and 70% isopropyl alcohol) Area Prepped: Posterolateral Lumbosacral Spine (Wide prep: From the lower border of the scapula down to the end of the tailbone and from flank to flank.)  Materials:  Tray: Block Needle(s):  Type: Spinal  Gauge (G): 22  Length: 5-in Qty: 3     H&P  (Pre-op Assessment):  Ronald Horne is a 73 y.o. (year old), male patient, seen today for interventional treatment. He  has a past surgical history that includes Tonsillectomy; Epidural block injection; and LEFT HEART CATH AND CORONARY ANGIOGRAPHY (Left, 12/27/2019). Ronald Horne has a current medication list which includes the following prescription(s): albuterol , amiodarone , apple cider vinegar, atorvastatin , digoxin , eliquis , entresto , famotidine, farxiga , fluticasone-salmeterol, hydrocodone-acetaminophen , levalbuterol, methimazole , metoprolol , multivitamin with minerals, ohtuvayre , omeprazole, potassium chloride , spironolactone , torsemide , and furosemide , and the following Facility-Administered Medications: iopamidol and lactated ringers. His primarily concern today is the Back Pain (Lower Back), Shoulder Injury, and Neck Pain  Initial Vital Signs:  Pulse/HCG Rate: (!) 107ECG Heart Rate: (!) 107 Temp: 97.7 F (36.5 C) Resp: 18 BP: 98/77 SpO2: 97 %  BMI: Estimated body mass index is 37.75 kg/m as calculated from the following:   Height as of this encounter: 6' 2 (1.88 m).   Weight as of this encounter: 294 lb (133.4 kg).  Risk Assessment: Allergies: Reviewed. He is allergic to albuterol .  Allergy Precautions: None required Coagulopathies: Reviewed. None identified.  Blood-thinner therapy: None at this time Active Infection(s): Reviewed. None identified. Ronald Horne is afebrile  Site Confirmation: Ronald Horne was asked to confirm the procedure and laterality before marking the site Procedure checklist: Completed Consent: Before the procedure and under the influence of no sedative(s), amnesic(s), or anxiolytics, the patient was informed of the treatment options, risks and possible complications. To fulfill our ethical and legal obligations, as recommended by the American Medical Association's Code of Ethics, I have informed the patient of my clinical impression; the nature and purpose of the  treatment or procedure; the risks, benefits, and possible complications of  the intervention; the alternatives, including doing nothing; the risk(s) and benefit(s) of the alternative treatment(s) or procedure(s); and the risk(s) and benefit(s) of doing nothing. The patient was provided information about the general risks and possible complications associated with the procedure. These may include, but are not limited to: failure to achieve desired goals, infection, bleeding, organ or nerve damage, allergic reactions, paralysis, and death. In addition, the patient was informed of those risks and complications associated to Spine-related procedures, such as failure to decrease pain; infection (i.e.: Meningitis, epidural or intraspinal abscess); bleeding (i.e.: epidural hematoma, subarachnoid hemorrhage, or any other type of intraspinal or peri-dural bleeding); organ or nerve damage (i.e.: Any type of peripheral nerve, nerve root, or spinal cord injury) with subsequent damage to sensory, motor, and/or autonomic systems, resulting in permanent pain, numbness, and/or weakness of one or several areas of the body; allergic reactions; (i.e.: anaphylactic reaction); and/or death. Furthermore, the patient was informed of those risks and complications associated with the medications. These include, but are not limited to: allergic reactions (i.e.: anaphylactic or anaphylactoid reaction(s)); adrenal axis suppression; blood sugar elevation that in diabetics may result in ketoacidosis or comma; water retention that in patients with history of congestive heart failure may result in shortness of breath, pulmonary edema, and decompensation with resultant heart failure; weight gain; swelling or edema; medication-induced neural toxicity; particulate matter embolism and blood vessel occlusion with resultant organ, and/or nervous system infarction; and/or aseptic necrosis of one or more joints. Finally, the patient was informed that  Medicine is not an exact science; therefore, there is also the possibility of unforeseen or unpredictable risks and/or possible complications that may result in a catastrophic outcome. The patient indicated having understood very clearly. We have given the patient no guarantees and we have made no promises. Enough time was given to the patient to ask questions, all of which were answered to the patient's satisfaction. Mr. Fleet has indicated that he wanted to continue with the procedure. Attestation: I, the ordering provider, attest that I have discussed with the patient the benefits, risks, side-effects, alternatives, likelihood of achieving goals, and potential problems during recovery for the procedure that I have provided informed consent. Date  Time: 08/12/2024  8:31 AM  Pre-Procedure Preparation:  Monitoring: As per clinic protocol. Respiration, ETCO2, SpO2, BP, heart rate and rhythm monitor placed and checked for adequate function Safety Precautions: Patient was assessed for positional comfort and pressure points before starting the procedure. Time-out: I initiated and conducted the Time-out before starting the procedure, as per protocol. The patient was asked to participate by confirming the accuracy of the Time Out information. Verification of the correct person, site, and procedure were performed and confirmed by me, the nursing staff, and the patient. Time-out conducted as per Joint Commission's Universal Protocol (UP.01.01.01). Time: 1000 Start Time: 1000 hrs.  Description of Procedure:          Laterality: (see above) Targeted Levels: (see above)  Safety Precautions: Aspiration looking for blood return was conducted prior to all injections. At no point did we inject any substances, as a needle was being advanced. Before injecting, the patient was told to immediately notify me if he was experiencing any new onset of ringing in the ears, or metallic taste in the mouth. No attempts  were made at seeking any paresthesias. Safe injection practices and needle disposal techniques used. Medications properly checked for expiration dates. SDV (single dose vial) medications used. After the completion of the procedure, all disposable equipment used was  discarded in the proper designated medical waste containers. Local Anesthesia: Protocol guidelines were followed. The patient was positioned over the fluoroscopy table. The area was prepped in the usual manner. The time-out was completed. The target area was identified using fluoroscopy. A 12-in long, straight, sterile hemostat was used with fluoroscopic guidance to locate the targets for each level blocked. Once located, the skin was marked with an approved surgical skin marker. Once all sites were marked, the skin (epidermis, dermis, and hypodermis), as well as deeper tissues (fat, connective tissue and muscle) were infiltrated with a small amount of a short-acting local anesthetic, loaded on a 10cc syringe with a 25G, 1.5-in  Needle. An appropriate amount of time was allowed for local anesthetics to take effect before proceeding to the next step. Local Anesthetic: Lidocaine 2.0% The unused portion of the local anesthetic was discarded in the proper designated containers. Technical description of process:  Medial Branch  Dorsal Rami Nerve Block (MBB):  Neuroanatomy note: Each lumbar facet joint receives dual innervation from medial branches arising from the posterior primary rami at the same level and one level above. The target for each lumbar medial branch is the junction of the ipsilateral superior articular and transverse process of the lower vertebral body. (i.e.: The L4-L5 facet joint is innervated by the L4 medial branch located at L5 and the L3 medial branch located at L4. Blocking the L4 Medial Branch is therefore achieved by injecting at the junction of the ipsilateral superior articular and transverse process of the lower vertebral body  L5.).  Exception: The exception to the above rule is the L5-S1 facet joint which has triple innervation requiring the L4 medial branch, as well as the L5 and the S1 Dorsal Rami(s) to be blocked to fully denervate the joint.  Under fluoroscopic guidance, a needle was inserted until contact was made with os over the target area. After negative aspiration, 2mL of the nerve block solution was injected without difficulty or complication. Paresthesia were avoided during injection. The needle(s) were removed intact and without complication.  Once the entire procedure was completed, the treated area was cleaned, making sure to leave some of the prepping solution back to take advantage of its long term bactericidal properties.         Illustration of the posterior view of the lumbar spine and the posterior neural structures. Laminae of L2 through S1 are labeled. DPRL5, dorsal primary ramus of L5; DPRS1, dorsal primary ramus of S1; DPR3, dorsal primary ramus of L3; FJ, facet (zygapophyseal) joint L3-L4; I, inferior articular process of L4; LB1, lateral branch of dorsal primary ramus of L1; IAB, inferior articular branches from L3 medial branch (supplies L4-L5 facet joint); IBP, intermediate branch plexus; MB3, medial branch of dorsal primary ramus of L3; NR3, third lumbar nerve root; S, superior articular process of L5; SAB, superior articular branches from L4 (supplies L4-5 facet joint also); TP3, transverse process of L3.   Facet Joint Innervation (* possible contribution)  L1-2 T12, L1 (L2*)  Medial Branch  L2-3 L1, L2 (L3*)                     L3-4 L2, L3 (L4*)                     L4-5 L3, L4 (L5*)                     L5-S1 L4, L5, S1  Vitals:   08/12/24 1005 08/12/24 1010 08/12/24 1015 08/12/24 1024  BP: 118/82 111/78 98/70 95/79   Pulse:      Resp: 15 18 17 18   Temp:      TempSrc:      SpO2: 100% 98% 100% 100%  Weight:      Height:         End Time: 1015  hrs.  Imaging Guidance (Spinal):         Type of Imaging Technique: Fluoroscopy Guidance (Spinal) Indication(s): Fluoroscopy guidance for needle placement to enhance accuracy in procedures requiring precise needle localization for targeted delivery of medication in or near specific anatomical locations not easily accessible without such real-time imaging assistance. Exposure Time: Please see nurses notes. Contrast: None used. Fluoroscopic Guidance: I was personally present during the use of fluoroscopy. Tunnel Vision Technique used to obtain the best possible view of the target area. Parallax error corrected before commencing the procedure. Direction-depth-direction technique used to introduce the needle under continuous pulsed fluoroscopy. Once target was reached, antero-posterior, oblique, and lateral fluoroscopic projection used confirm needle placement in all planes. Images permanently stored in EMR. Interpretation: No contrast injected. I personally interpreted the imaging intraoperatively. Adequate needle placement confirmed in multiple planes. Permanent images saved into the patient's record.  Post-operative Assessment:  Post-procedure Vital Signs:  Pulse/HCG Rate: (!) 107(!) 104 Temp: 97.7 F (36.5 C) Resp: 18 BP: 95/79 SpO2: 100 %  EBL: None  Complications: No immediate post-treatment complications observed by team, or reported by patient.  Note: The patient tolerated the entire procedure well. A repeat set of vitals were taken after the procedure and the patient was kept under observation following institutional policy, for this type of procedure. Post-procedural neurological assessment was performed, showing return to baseline, prior to discharge. The patient was provided with post-procedure discharge instructions, including a section on how to identify potential problems. Should any problems arise concerning this procedure, the patient was given instructions to immediately  contact us , at any time, without hesitation. In any case, we plan to contact the patient by telephone for a follow-up status report regarding this interventional procedure.  Comments:  No additional relevant information.  Plan of Care (POC)  Orders:  Orders Placed This Encounter  Procedures   DG PAIN CLINIC C-ARM 1-60 MIN NO REPORT    Intraoperative interpretation by procedural physician at [facility].    Standing Status:   Standing    Number of Occurrences:   1    Reason for exam::   Assistance in needle guidance and placement for procedures requiring needle placement in or near specific anatomical locations not easily accessible without such assistance.     Medications ordered for procedure: Meds ordered this encounter  Medications   iopamidol (ISOVUE-M) 41 % intrathecal injection 10 mL    Must be Myelogram-compatible. If not available, you may substitute with a water-soluble, non-ionic, hypoallergenic, myelogram-compatible radiological contrast medium.   lidocaine (XYLOCAINE) 2 % (with pres) injection 400 mg   lactated ringers infusion   midazolam  (VERSED ) injection 0.5-2 mg    Make sure Flumazenil is available in the pyxis when using this medication. If oversedation occurs, administer 0.2 mg IV over 15 sec. If after 45 sec no response, administer 0.2 mg again over 1 min; may repeat at 1 min intervals; not to exceed 4 doses (1 mg)   ropivacaine (PF) 2 mg/mL (0.2%) (NAROPIN) injection 18 mL   dexamethasone  (DECADRON ) injection 20 mg   ropivacaine (PF) 2 mg/mL (0.2%) (  NAROPIN) injection 2 mL   sodium chloride  flush (NS) 0.9 % injection 2 mL   dexamethasone  (DECADRON ) injection 10 mg   Medications administered: We administered lidocaine, lactated ringers, midazolam , ropivacaine (PF) 2 mg/mL (0.2%), dexamethasone , ropivacaine (PF) 2 mg/mL (0.2%), sodium chloride  flush, and dexamethasone .  See the medical record for exact dosing, route, and time of administration.     Bilateral L3, L4, L5 medial branch nerve block 1 08/12/2024, right cervical ESI 08/12/2024    Follow-up plan:   Return in about 1 week (around 08/19/2024) for PPE in person.     Recent Visits Date Type Provider Dept  07/23/24 Office Visit Marcelino Nurse, MD [facility]  Showing recent visits within past 90 days and meeting all other requirements Today's Visits Date Type Provider Dept  08/12/24 Procedure visit Marcelino Nurse, MD [facility]  Showing today's visits and meeting all other requirements Future Appointments Date Type Provider Dept  09/05/24 Appointment Marcelino Nurse, MD [facility]  Showing future appointments within next 90 days and meeting all other requirements   Disposition: Discharge home  Discharge (Date  Time): 08/12/2024; 1025 hrs.   Primary Care Physician: [name], NP Location: [facility] Note by: Nurse Marcelino, MD (TTS technology used. I apologize for any typographical errors that were not detected and corrected.) Date: 08/12/2024; Time: 10:43 AM  Disclaimer:  Medicine is not an Visual merchandiser. The only guarantee in medicine is that nothing is guaranteed. It is important to note that the decision to proceed with this intervention was based on the information collected from the patient. The Data and conclusions were drawn from the patient's questionnaire, the interview, and the physical examination. Because the information was provided in large part by the patient, it cannot be guaranteed that it has not been purposely or unconsciously manipulated. Every effort has been made to obtain as much relevant data as possible for this evaluation. It is important to note that the conclusions that lead to this procedure are derived in large part from the available data. Always take into account that the treatment will also be dependent on availability of resources and existing treatment guidelines, considered by  other Pain Management Practitioners as being common knowledge and practice, at the time of the intervention. For Medico-Legal purposes, it is also important to point out that variation in procedural techniques and pharmacological choices are the acceptable norm. The indications, contraindications, technique, and results of the above procedure should only be interpreted and judged by a Board-Certified Interventional Pain Specialist with extensive familiarity and expertise in the same exact procedure and technique.

## 2024-08-13 ENCOUNTER — Telehealth: Payer: Self-pay

## 2024-08-13 ENCOUNTER — Telehealth: Payer: Self-pay | Admitting: *Deleted

## 2024-08-13 NOTE — Telephone Encounter (Signed)
 Patient called back. Denies any needs at this time. Informed of followvup appointment per patient. No ink in printer for AVS. Patient wrote on calender

## 2024-08-13 NOTE — Telephone Encounter (Signed)
 Post procedure call; voicemail left

## 2024-08-13 NOTE — Telephone Encounter (Signed)
Called for post procedure check. No answer. Left voicemail.

## 2024-08-19 ENCOUNTER — Telehealth: Payer: Self-pay | Admitting: Student in an Organized Health Care Education/Training Program

## 2024-08-19 NOTE — Telephone Encounter (Signed)
 Patient came in to office asking to get pain medications. Says he couldn't get out of bed the other day due to all of the pain. I explained it sometimes takes about 14 days before the injection meds start working. Patient states he will just call his other doctor that wrote the meds before to get something. I explained I could not add him to Dr Charolotte schedule today he is doing procedures. Patient says he will see us  on 09-05-24 but he couldn't wait that long for pain meds and he will call his other doctor.

## 2024-08-29 ENCOUNTER — Ambulatory Visit (INDEPENDENT_AMBULATORY_CARE_PROVIDER_SITE_OTHER)

## 2024-08-29 DIAGNOSIS — I361 Nonrheumatic tricuspid (valve) insufficiency: Secondary | ICD-10-CM | POA: Diagnosis not present

## 2024-08-29 DIAGNOSIS — I4711 Inappropriate sinus tachycardia, so stated: Secondary | ICD-10-CM

## 2024-08-29 DIAGNOSIS — R0602 Shortness of breath: Secondary | ICD-10-CM

## 2024-08-29 DIAGNOSIS — I340 Nonrheumatic mitral (valve) insufficiency: Secondary | ICD-10-CM

## 2024-08-29 DIAGNOSIS — I480 Paroxysmal atrial fibrillation: Secondary | ICD-10-CM

## 2024-08-29 DIAGNOSIS — I5021 Acute systolic (congestive) heart failure: Secondary | ICD-10-CM

## 2024-08-29 DIAGNOSIS — I10 Essential (primary) hypertension: Secondary | ICD-10-CM

## 2024-08-29 DIAGNOSIS — I519 Heart disease, unspecified: Secondary | ICD-10-CM

## 2024-09-03 ENCOUNTER — Ambulatory Visit (INDEPENDENT_AMBULATORY_CARE_PROVIDER_SITE_OTHER): Admitting: Cardiovascular Disease

## 2024-09-03 ENCOUNTER — Encounter: Payer: Self-pay | Admitting: Cardiovascular Disease

## 2024-09-03 VITALS — BP 114/71 | HR 99 | Ht 74.0 in | Wt 300.2 lb

## 2024-09-03 DIAGNOSIS — I519 Heart disease, unspecified: Secondary | ICD-10-CM

## 2024-09-03 DIAGNOSIS — I4711 Inappropriate sinus tachycardia, so stated: Secondary | ICD-10-CM | POA: Diagnosis not present

## 2024-09-03 DIAGNOSIS — I4891 Unspecified atrial fibrillation: Secondary | ICD-10-CM

## 2024-09-03 DIAGNOSIS — I5021 Acute systolic (congestive) heart failure: Secondary | ICD-10-CM

## 2024-09-03 DIAGNOSIS — I10 Essential (primary) hypertension: Secondary | ICD-10-CM | POA: Diagnosis not present

## 2024-09-03 DIAGNOSIS — G4733 Obstructive sleep apnea (adult) (pediatric): Secondary | ICD-10-CM | POA: Diagnosis not present

## 2024-09-03 DIAGNOSIS — R0602 Shortness of breath: Secondary | ICD-10-CM

## 2024-09-03 MED ORDER — FUROSEMIDE 40 MG PO TABS: 40.0000 mg | ORAL_TABLET | Freq: Every day | ORAL | 3 refills | Status: DC

## 2024-09-03 NOTE — Progress Notes (Signed)
 Cardiology Office Note   Date:  09/03/2024   ID:  Ronald Horne, DOB 1951/03/23, MRN [id number]  PCP:  [name], NP  Cardiologist:  Denyse Bathe, MD      History of Present Illness: Ronald Horne is a 73 y.o. male who presents for  Chief Complaint  Patient presents with   Follow-up    Echo results    Feeling good.      Past Medical History:  Diagnosis Date   Arthritis    Cardiomyopathy (HCC)    a. 08/2016 Echo: EF 30-35%, diff HK; b. 03/2018 Ex MV ([facility]): Ex time 2:00, large anter defect, EF 24%; c. 03/2018 Echo: EF 20-25%, diff HK. Gr1 DD; d. Refused cath.   Chronic back pain    Chronic combined systolic (congestive) and diastolic (congestive) heart failure (HCC)    a. 08/2016 Echo: EF 30-35%, diff HK, mild to mod MR, mild BAE; b. 03/2018 Echo: EF 20-25%, diff HK. Gr1 DD. MIldly dil Ao root. Mild BAE. Mildly reduced RV fxn.   COPD (chronic obstructive pulmonary disease) (HCC)    GERD (gastroesophageal reflux disease)    Gout    Hypertension    Obesity    Sleep apnea      Past Surgical History:  Procedure Laterality Date   EPIDURAL BLOCK INJECTION     chronic back pain    LEFT HEART CATH AND CORONARY ANGIOGRAPHY Left 12/27/2019   Procedure: LEFT HEART CATH AND CORONARY ANGIOGRAPHY;  Surgeon: Bathe Denyse LABOR, MD;  Location: [facility];  Service: Cardiovascular;  Laterality: Left;   TONSILLECTOMY       Current Outpatient Medications  Medication Sig Dispense Refill   albuterol  (VENTOLIN  HFA) 108 (90 Base) MCG/ACT inhaler Inhale 1-2 puffs into the lungs every 6 (six) hours as needed for shortness of breath or wheezing.     amiodarone  (PACERONE ) 200 MG tablet Take 1 tablet (200 mg total) by mouth daily. 30 tablet 2   APPLE CIDER VINEGAR PO Take 15 mLs by mouth in the morning and at bedtime.     atorvastatin  (LIPITOR) 20 MG tablet Take 20 mg by mouth in the morning.     digoxin  (LANOXIN ) 0.25 MG tablet Take 1 tablet (250 mcg total) by mouth  daily. 30 tablet 11   ELIQUIS  5 MG TABS tablet TAKE 1 TABLET TWICE A DAY 60 tablet 0   ENTRESTO  97-103 MG TAKE 1 TABLET BY MOUTH TWICE A DAY 60 tablet 6   famotidine (PEPCID) 40 MG tablet Take 40 mg by mouth.     FARXIGA  10 MG TABS tablet TAKE 1 TABLET DAILY 30 tablet 6   fluticasone-salmeterol (ADVAIR) 250-50 MCG/ACT AEPB Inhale 1 puff into the lungs 2 (two) times daily.     HYDROcodone-acetaminophen  (NORCO/VICODIN) 5-325 MG tablet Take 1 tablet by mouth every 4 (four) hours as needed.     levalbuterol (XOPENEX HFA) 45 MCG/ACT inhaler Inhale into the lungs.     methimazole  (TAPAZOLE ) 5 MG tablet Take 5 mg by mouth in the morning.     metoprolol  (TOPROL  XL) 200 MG 24 hr tablet Take 1 tablet (200 mg total) by mouth daily. 30 tablet 11   Multiple Vitamin (MULTIVITAMIN WITH MINERALS) TABS tablet Take 1 tablet by mouth daily.     OHTUVAYRE  3 MG/2.5ML SUSP      omeprazole (PRILOSEC) 20 MG capsule Take 20 mg by mouth daily.     potassium chloride  (KLOR-CON ) 10 MEQ tablet Take 10 mEq by  mouth daily.     spironolactone  (ALDACTONE ) 25 MG tablet TAKE 1/2 TABLET BY MOUTH DAILY 15 tablet 6   torsemide  (DEMADEX ) 10 MG tablet Take 1 tablet (10 mg total) by mouth 2 (two) times daily. 60 tablet 11   furosemide  (LASIX ) 40 MG tablet Take 1 tablet (40 mg total) by mouth daily. 30 tablet 3   No current facility-administered medications for this visit.    Allergies:   Albuterol     Social History:   reports that he quit smoking about 47 years ago. His smoking use included cigarettes. He started smoking about 48 years ago. He has a 3 pack-year smoking history. He has never used smokeless tobacco. He reports that he does not drink alcohol and does not use drugs.   Family History:  family history is not on file.    ROS:     Review of Systems  Constitutional: Negative.   HENT: Negative.    Eyes: Negative.   Respiratory: Negative.    Gastrointestinal: Negative.   Genitourinary: Negative.   Musculoskeletal:  Negative.   Skin: Negative.   Neurological: Negative.   Endo/Heme/Allergies: Negative.   Psychiatric/Behavioral: Negative.    All other systems reviewed and are negative.     All other systems are reviewed and negative.    PHYSICAL EXAM: VS:  BP 114/71   Pulse 99   Ht 6' 2 (1.88 m)   Wt (!) 300 lb 3.2 oz (136.2 kg)   SpO2 95%   BMI 38.54 kg/m  , BMI Body mass index is 38.54 kg/m. Last weight:  Wt Readings from Last 3 Encounters:  09/03/24 (!) 300 lb 3.2 oz (136.2 kg)  08/12/24 294 lb (133.4 kg)  08/09/24 296 lb 12.8 oz (134.6 kg)     Physical Exam Vitals reviewed.  Constitutional:      Appearance: Normal appearance. He is normal weight.  HENT:     Head: Normocephalic.     Nose: Nose normal.     Mouth/Throat:     Mouth: Mucous membranes are moist.  Eyes:     Pupils: Pupils are equal, round, and reactive to light.  Cardiovascular:     Rate and Rhythm: Normal rate and regular rhythm.     Pulses: Normal pulses.     Heart sounds: Normal heart sounds.  Pulmonary:     Effort: Pulmonary effort is normal.  Abdominal:     General: Abdomen is flat. Bowel sounds are normal.  Musculoskeletal:        General: Normal range of motion.     Cervical back: Normal range of motion.  Skin:    General: Skin is warm.  Neurological:     General: No focal deficit present.     Mental Status: He is alert.  Psychiatric:        Mood and Affect: Mood normal.       EKG:   Recent Labs: 04/10/2024: ALT 19 05/23/2024: Magnesium 1.8 07/23/2024: B Natriuretic Peptide 717.7; BUN 17; Creatinine, Ser 1.06; Hemoglobin 15.1; Platelets 209; Potassium 3.3; Sodium 137    Lipid Panel No results found for: CHOL, TRIG, HDL, CHOLHDL, VLDL, LDLCALC, LDLDIRECT    Other studies Reviewed: Additional studies/ records that were reviewed today include:  Review of the above records demonstrates:      04/12/2018   11:13 AM  PAD Screen  Previous PAD dx? No  Previous surgical  procedure? No  Pain with walking? No  Feet/toe relief with dangling? No  Painful, non-healing ulcers? No  Extremities discolored? No      ASSESSMENT AND PLAN:    ICD-10-CM   1. Cardiopathy  I51.9    LVEF 37%, but doing well, less SOB.    2. Inappropriate sinus tachycardia  I47.11     3. Obstructive sleep apnea syndrome  G47.33     4. Primary hypertension  I10     5. Acute HFrEF (heart failure with reduced ejection fraction) (HCC)  I50.21     6. SOB (shortness of breath)  R06.02     7. Atrial fibrillation with rapid ventricular response (HCC)  I48.91        Problem List Items Addressed This Visit       Cardiovascular and Mediastinum   Acute HFrEF (heart failure with reduced ejection fraction) (HCC)   Relevant Medications   furosemide  (LASIX ) 40 MG tablet   Hypertension   Relevant Medications   furosemide  (LASIX ) 40 MG tablet   Cardiopathy - Primary   Relevant Medications   furosemide  (LASIX ) 40 MG tablet     Respiratory   Obstructive sleep apnea syndrome     Other   SOB (shortness of breath)   Other Visit Diagnoses       Inappropriate sinus tachycardia       Relevant Medications   furosemide  (LASIX ) 40 MG tablet     Atrial fibrillation with rapid ventricular response (HCC)       Relevant Medications   furosemide  (LASIX ) 40 MG tablet          Disposition:   Return in about 2 months (around 11/03/2024).    Total time spent: 30 minutes  Signed,  Denyse Bathe, MD  09/03/2024 9:43 AM    [facility]

## 2024-09-05 ENCOUNTER — Encounter: Payer: Self-pay | Admitting: Student in an Organized Health Care Education/Training Program

## 2024-09-05 ENCOUNTER — Ambulatory Visit
Attending: Student in an Organized Health Care Education/Training Program | Admitting: Student in an Organized Health Care Education/Training Program

## 2024-09-05 VITALS — BP 96/62 | HR 99 | Temp 97.4°F | Resp 16 | Ht 74.0 in | Wt 299.0 lb

## 2024-09-05 DIAGNOSIS — M75101 Unspecified rotator cuff tear or rupture of right shoulder, not specified as traumatic: Secondary | ICD-10-CM | POA: Diagnosis present

## 2024-09-05 DIAGNOSIS — M12811 Other specific arthropathies, not elsewhere classified, right shoulder: Secondary | ICD-10-CM | POA: Diagnosis not present

## 2024-09-05 DIAGNOSIS — G894 Chronic pain syndrome: Secondary | ICD-10-CM | POA: Insufficient documentation

## 2024-09-05 DIAGNOSIS — M47816 Spondylosis without myelopathy or radiculopathy, lumbar region: Secondary | ICD-10-CM | POA: Insufficient documentation

## 2024-09-05 LAB — COLOGUARD

## 2024-09-05 NOTE — Patient Instructions (Addendum)
 Medial Branch Nerve Block  Medial branch nerve block is a procedure to numb the nerves that supply the joints between your spinal bones (facet joints). The facet joints are located on the back of your spine. You may have the procedure on your neck or your upper, middle, or lower spine. This procedure is often a diagnostic procedure done to determine if the facet joint is the source of pain. During this procedure, your health care provider will inject a long-acting anesthetic to numb the medial nerves near the facet joint that is being treated. If more than one facet joint is causing pain, you may have more than one injection. In some cases, an anti-inflammatory medicine (steroid) will also be injected. You may need this procedure if: Your health care provider wants to diagnose a facet joint as the cause of your pain. You have an injury to a facet joint. You have back pain from wear and tear (osteoarthritis) of your facet joint. If the pain was from the facet joints, pain will be relieved for a few hours to a few days. Tell a health care provider about: Any allergies you have. All medicines you are taking, including vitamins, herbs, eye drops, creams, and over-the-counter medicines. Any problems you or family members have had with anesthetic medicines. Any bleeding problems you have. Any surgeries you have had. Any medical conditions you have. Whether you are pregnant or may be pregnant. Whether you are breastfeeding. What are the risks? Generally, this is a safe procedure. However, problems may occur, including: Infection. Bleeding. Allergic reactions to medicines or dyes. Damage to other structures or organs. Injection of the anesthetic into a blood vessel. This may decrease blood supply to your spinal cord and cause damage. Spread of the anesthetic to nearby nerves. This may cause temporary weakness or numbness. What happens before the procedure? Follow instructions from your health care  provider about when you should stop eating or drinking before the procedure. Plan to have a responsible adult take you home from the hospital or clinic. Ask your health care provider about: Changing or stopping your regular medicines. This is especially important if you are taking diabetes medicines or blood thinners. Taking medicines such as aspirin  and ibuprofen. These medicines can thin your blood. Do not take these medicines unless your health care provider tells you to take them. Taking over-the-counter medicines, vitamins, herbs, and supplements. Ask your health care provider: How your injection site will be marked. What steps will be taken to help prevent infection. These steps may include: Removing hair at the surgery site. Washing skin with a germ-killing soap. What happens during the procedure? An IV may be inserted into one of your veins. You will be given one or both of the following: A medicine to help you relax (sedative). A medicine to numb the area (local anesthetic). This is a short-acting anesthetic that numbs the area before the needle is passed into the facet joint. Your health care provider will pass a needle into the area around the facet joint. Your health care provider may use a type of X-ray (fluoroscopy) to look at images of your spinal cord. If fluoroscopy is used, a small amount of dye will be injected into the facet joint area. The dye will show up on fluoroscopy and help locate the exact area to inject the long-acting anesthetic. The long-acting anesthetic will be injected. A steroid may also be injected at the same time. The needle will be removed. A bandage (dressing) will be placed  over the injection site. The procedure may vary among health care providers and hospitals. What can I expect after the procedure? Your blood pressure, heart rate, breathing rate, and blood oxygen level will be monitored until you leave the hospital or clinic. You should feel less  pain in your back. You may have some soreness around the injection site. You may have a temporary increase in blood sugar due to the steroid. Follow these instructions at home: Injection site care Leave your dressing on for 24 hours. Do not take baths, swim, or use a hot tub until your health care provider approves. Ask your health care provider if you may take showers. You may only be allowed to take sponge baths. Check your injection site every day for signs of infection. Check for: Redness, swelling, or more pain. Fluid or blood. Warmth. Pus or a bad smell. If directed, put ice on the affected area. To do this: Put ice in a plastic bag. Place a towel between your skin and the bag. Leave the ice on for 20 minutes, 2-3 times a day. Remove the ice if your skin turns bright red. This is very important. If you cannot feel pain, heat, or cold, you have a greater risk of damage to the area. Activity If you were given a sedative during the procedure, it can affect you for several hours. Do not drive or operate machinery until your health care provider says that it is safe. Do not drive if the injection causes numbness in a body part needed for driving. Return to your normal activities as told by your health care provider. Ask your health care provider what activities are safe for you. General instructions Take over-the-counter and prescription medicines only as told by your health care provider. Keep a log of your pain after the procedure. Keep track of how much pain you have and when you have it. This will help your health care provider plan your future treatment. You should have relief of pain from the long-acting anesthetic for up to 3 days. After that, you may notice some pain again until the steroid starts to help, if you were given a steroid. Pain relief from the steroid may last for a few weeks. Keep all follow-up visits. This is important. Contact your health care provider if: Your  pain is not relieved or gets worse at home. You have a fever or chills. You have any signs of infection, including: Redness, swelling, or more pain. Fluid or blood. Warmth. Pus or a bad smell. You develop any numbness or weakness. You have diabetes and your blood sugar remains above 180 mg/dL. Summary Medial branch nerve block is a procedure to numb the nerves that supply the joints between your facet joints. You may have the procedure on your neck or your upper, middle, or lower spine. This procedure may be done to diagnose and relieve facet joint pain. A long-acting local anesthetic is injected close to the nerve that supplies the facet joint. A steroid may also be injected. This information is not intended to replace advice given to you by your health care provider. Make sure you discuss any questions you have with your health care provider. Document Revised: 06/11/2021 Document Reviewed: 06/11/2021  Elsevier Patient Education  2024 [employer].

## 2024-09-05 NOTE — Progress Notes (Signed)
 Safety precautions to be maintained throughout the outpatient stay will include: orient to surroundings, keep bed in low position, maintain call bell within reach at all times, provide assistance with transfer out of bed and ambulation.

## 2024-09-05 NOTE — Progress Notes (Signed)
 PROVIDER NOTE: Interpretation of information contained herein should be left to medically-trained personnel. Specific patient instructions are provided elsewhere under Patient Instructions section of medical record. This document was created in part using AI and STT-dictation technology, any transcriptional errors that may result from this process are unintentional.  Patient: Ronald Horne  Service: E/M   PCP: [name], NP  DOB: 08-07-1951  DOS: 09/05/2024  Provider: Wallie Sherry, MD  MRN: [id number]  Delivery: Face-to-face  Specialty: Interventional Pain Management  Type: Established Patient  Setting: Ambulatory outpatient facility  Specialty designation: 09  Referring Prov.: [name], NP  Location: Outpatient office facility       History of present illness (HPI) Ronald Horne, a 73 y.o. year old male, is here today because of his Lumbar facet arthropathy [M47.816]. Ronald Horne primary complain today is Back Pain (Low medial), Hip Pain (Bilateral ), and Shoulder Pain (Right +)  Pertinent problems: Ronald Horne does not have any pertinent problems on file.  Pain Assessment: Severity of Chronic pain is reported as a 9 /10. Location: Back Lower/Denies. Onset: More than a month ago. Quality: Constant, Tightness, Squeezing. Timing: Constant. Modifying factor(s): Denies. Vitals:  height is 6' 2 (1.88 m) and weight is 299 lb (135.6 kg). His temporal temperature is 97.4 F (36.3 C) (abnormal). His blood pressure is 96/62 and his pulse is 99. His respiration is 16 and oxygen saturation is 97%.  BMI: Estimated body mass index is 38.39 kg/m as calculated from the following:   Height as of this encounter: 6' 2 (1.88 m).   Weight as of this encounter: 299 lb (135.6 kg).  Last encounter: 07/23/2024. Last procedure: 08/12/2024.  Reason for encounter: post-procedure evaluation and assessment.   Discussed the use of AI scribe software for clinical note transcription with the patient, who  gave verbal consent to proceed.  Post-Procedure Evaluation    Procedure 1 Type: Cervical Epidural Steroid injection (CESI) (Interlaminar) #1  Laterality: Right (-RT)  Level: C7-T1 DOS: 08/12/2024  Provider: Wallie Sherry, MD Imaging: Fluoroscopy-guided Spinal ([id number]) Anesthesia: Local anesthesia (1-2% Lidocaine) Sedation: Minimal Sedation                        Patient stopped Eliquis  3 days prior  Medical Necessity Purpose: Diagnostic/Therapeutic Rationale (medical necessity): procedure needed and proper for the diagnosis and/or treatment of Ronald Horne medical symptoms and needs. Indications: Cervicalgia, cervical radicular pain, degenerative disc disease, severe enough to impact quality of life or function. Cervical radicular pain NAS-11 Pain score:   Pre-procedure: 7 /10   Post-procedure:  (6 in neck, 6 lower back)/10   Procedure 2 Type: Lumbar Facet, Medial Branch Block(s) (w/ fluoroscopic mapping) #1  Laterality: Bilateral  Level: L3, L4, and L5 Medial Branch/Dorsal Rami Level(s). Injecting these levels blocks the L3-4 and L4-5 lumbar facet joints. Imaging: Fluoroscopic guidance Spinal ([id number]) Anesthesia: Local anesthesia (1-2% Lidocaine) Sedation: Minimal Sedation                       DOS: 08/12/2024 Performed by: Wallie Sherry, MD   Patient stopped Eliquis  3 days prior   Primary Purpose: Diagnostic/Therapeutic Indications: Low back pain severe enough to impact quality of life or function. 1. Lumbar facet arthropathy   2. Lumbar spondylosis   3. Chronic pain syndrome     NAS-11 Pain score:        Pre-procedure: 7 /10        Post-procedure:  (6  in neck, 6 lower back)/10      Effectiveness:  Initial hour after procedure: 100 % Subsequent 4-6 hours post-procedure: 100 %  Analgesia past initial 6 hours: 85% for 5 days Ongoing improvement:  Analgesic:  back to baseline for lower back, 50% improvement for cervical radicular pain Function: Somewhat  improved ROM: Somewhat improved   History of Present Illness   Ronald Horne is a 73 year old male with cervical radicular pain and lumbar facet arthropathy, postprocedure evaluation as detailed above.  He experiences neck stiffness and pain, described as a tightening sensation in the muscle. This stiffness began after receiving an injection in his neck. Initially, the injection provided about 80-85% pain relief for 5 days, but on the sixth day, he experienced significant difficulty getting out of bed, which has persisted. He can perform certain movements without pain but feels a sharp pain when turning his neck.   In addition to his neck issues, he experiences pain in his lower back and hips.  He has had lumbar transforaminal injections in the past with Dr. Mozelle with [facility].  He is currently engaging in physical activity at the [facility], focusing on moderate exercises without overexertion. He avoids activities involving his knees due to instability, describing episodes where his knees 'give out'.  His current medications include Eliquis , a blood thinner.   He does endorse right shoulder pain with worsening range of motion.        UDS:  Summary  Date Value Ref Range Status  07/23/2024 FINAL  Final    Comment:    ==================================================================== Compliance Drug Analysis, Ur ==================================================================== Test                             Result       Flag       Units  Drug Present and Declared for Prescription Verification   Hydrocodone                    90           EXPECTED   ng/mg creat   Hydromorphone                  54           EXPECTED   ng/mg creat   Dihydrocodeine                 51           EXPECTED   ng/mg creat   Norhydrocodone                 91           EXPECTED   ng/mg creat    Sources of hydrocodone include scheduled prescription medications.    Hydromorphone, dihydrocodeine and  norhydrocodone are expected    metabolites of hydrocodone. Hydromorphone and dihydrocodeine are    also available as scheduled prescription medications.    Acetaminophen                   PRESENT      EXPECTED  Drug Absent but Declared for Prescription Verification   Metoprolol                      Not Detected UNEXPECTED ==================================================================== Test                      Result  Flag   Units      Ref Range   Creatinine              144              mg/dL      >=79 ==================================================================== Declared Medications:  The flagging and interpretation on this report are based on the  following declared medications.  Unexpected results may arise from  inaccuracies in the declared medications.   **Note: The testing scope of this panel includes these medications:   Hydrocodone  Metoprolol  (Toprol )   **Note: The testing scope of this panel does not include small to  moderate amounts of these reported medications:   Acetaminophen    **Note: The testing scope of this panel does not include the  following reported medications:   Albuterol  (Ventolin  HFA)  Amiodarone  (Pacerone )  Apixaban  (Eliquis )  Atorvastatin  (Lipitor)  Dapagliflozin  (Farxiga )  Digoxin  (Lanoxin )  Famotidine (Pepcid)  Methimazole  (Tapazole )  Multivitamin  Omeprazole (Prilosec)  Sacubitril  (Entresto )  Spironolactone  (Aldactone )  Supplement  Torsemide  (Demadex )  Valsartan  (Entresto ) ==================================================================== For clinical consultation, please call [phone]. ====================================================================     No results found for: CBDTHCR No results found for: D8THCCBX No results found for: D9THCCBX  ROS  Constitutional: Denies any fever or chills Gastrointestinal: No reported hemesis, hematochezia, vomiting, or acute GI distress Musculoskeletal:  Denies any acute onset joint swelling, redness, loss of ROM, or weakness Neurological: No reported episodes of acute onset apraxia, aphasia, dysarthria, agnosia, amnesia, paralysis, loss of coordination, or loss of consciousness  Medication Review  Apple Cider Vinegar, Ensifentrine , HYDROcodone-acetaminophen , albuterol , amiodarone , apixaban , atorvastatin , dapagliflozin  propanediol, digoxin , famotidine, fluticasone-salmeterol, furosemide , levalbuterol, methimazole , metoprolol , multivitamin with minerals, omeprazole, potassium chloride , sacubitril -valsartan , spironolactone , and torsemide   History Review  Allergy: Ronald Horne is allergic to albuterol . Drug: Ronald Horne  reports no history of drug use. Alcohol:  reports no history of alcohol use. Tobacco:  reports that he quit smoking about 47 years ago. His smoking use included cigarettes. He started smoking about 48 years ago. He has a 3 pack-year smoking history. He has never used smokeless tobacco. Social: Ronald Horne  reports that he quit smoking about 47 years ago. His smoking use included cigarettes. He started smoking about 48 years ago. He has a 3 pack-year smoking history. He has never used smokeless tobacco. He reports that he does not drink alcohol and does not use drugs. Medical:  has a past medical history of Arthritis, Cardiomyopathy (HCC), Chronic back pain, Chronic combined systolic (congestive) and diastolic (congestive) heart failure (HCC), COPD (chronic obstructive pulmonary disease) (HCC), GERD (gastroesophageal reflux disease), Gout, Hypertension, Obesity, and Sleep apnea. Surgical: Ronald Horne  has a past surgical history that includes Tonsillectomy; Epidural block injection; and LEFT HEART CATH AND CORONARY ANGIOGRAPHY (Left, 12/27/2019). Family: family history is not on file.  Laboratory Chemistry Profile   Renal Lab Results  Component Value Date   BUN 17 07/23/2024   CREATININE 1.06 07/23/2024   BCR 13 02/09/2024   GFRAA >60  12/27/2019   GFRNONAA >60 07/23/2024    Hepatic Lab Results  Component Value Date   AST 20 04/10/2024   ALT 19 04/10/2024   ALBUMIN 3.2 (L) 04/10/2024   ALKPHOS 51 04/10/2024   LIPASE 31 04/10/2024    Electrolytes Lab Results  Component Value Date   NA 137 07/23/2024   K 3.3 (L) 07/23/2024   CL 105 07/23/2024   CALCIUM  7.9 (L) 07/23/2024   MG 1.8 05/23/2024    Bone No  results found for: VD25OH, [id number], [id number], [id number], 25OHVITD1, 25OHVITD2, 25OHVITD3, TESTOFREE, TESTOSTERONE  Inflammation (CRP: Acute Phase) (ESR: Chronic Phase) Lab Results  Component Value Date   LATICACIDVEN 2.1 (HH) 05/19/2023         Note: Above Lab results reviewed.  Recent Imaging Review  PCV ECHOCARDIOGRAM COMPLETE Images from the original result were not included. Reason for Visit   Echocardiogram  INDICATIONS:   Shortness of breath  Echocardiogram: An echocardiogram in (2-d) mode was performed and in Doppler mode with  color flow velocity mapping was performed.   ventricular septum thickness 0.978 cm, L ventricular posterior wall  thickness (diastole) 1.86 cm, left atrium size 6.0 cm, aortic root  diameter 3.5 cm, L ventricle diastolic dimension 6.42 cm, L ventricle  systolic dimension 5.22, L ventricle ejection fraction 37.6 %, and LV  fractional shortening 18.7 % L ventricular outflow tract internal diameter 3.7 cm, L ventricular  outflow tract flow velocity 0.737 m/s, aortic valve cusps 2.4 cm , aortic  valve flow velocity 0.869 (m/sec), and aortic valve systolic calculated  mean flow gradient 1 mmHg Mitral valve has moderate regurgitation Tricuspid valve has trace regurgitation  ASSESSMENT Technically adequate study. Left atrium & right atrium severely dilated. Severe left ventricular systolic dysfunction. No left ventricular hypertrophy. Normal right ventricular systolic function. Normal right ventricular diastolic function. Right ventricular  diastolic dysfunction. Severe hypokinesis left ventricular wall motion. No pulmonary regurgitation. Trace tricuspid regurgitation. Mild pulmonary hypertension. Normal pulmonary artery pressure. Moderate mitral regurgitation. No aortic regurgitation. No pericardial effusion. Unable to determine LV diastolic dysfunction due to patient being in  Atrial Fibrillation. DG Lumbar Spine Complete   Narrative CLINICAL DATA:  low back pain   EXAM: LUMBAR SPINE - COMPLETE 5 VIEW   COMPARISON:  None Available.   FINDINGS: No fracture, dislocation or subluxation. No spondylolisthesis. No osteolytic or osteoblastic changes.   Degenerative disc disease noted with disc space narrowing and marginal osteophytes at L4-L5-S1.   IMPRESSION: Degenerative changes. No acute osseous abnormalities.     Electronically Signed By: Fonda Field M.D. On: 07/01/2023 15:07 CLINICAL DATA:  Right shoulder pain   EXAM: RIGHT SHOULDER - 2+ VIEW   COMPARISON:  None Available.   FINDINGS: Osteoarthrosis with subchondral cystic changes of the humeral head   No other fracture or bony or articular abnormalities   IMPRESSION: Osteoarthrosis  Note: Reviewed        Physical Exam  Vitals: BP 96/62 (Patient Position: Sitting, Cuff Size: Large)   Pulse 99   Temp (!) 97.4 F (36.3 C) (Temporal)   Resp 16   Ht 6' 2 (1.88 m)   Wt 299 lb (135.6 kg)   SpO2 97%   BMI 38.39 kg/m  BMI: Estimated body mass index is 38.39 kg/m as calculated from the following:   Height as of this encounter: 6' 2 (1.88 m).   Weight as of this encounter: 299 lb (135.6 kg). Ideal: Ideal body weight: 82.2 kg (181 lb 3.5 oz) Adjusted ideal body weight: 103.6 kg (228 lb 5.3 oz) General appearance: Well nourished, well developed, and well hydrated. In no apparent acute distress Mental status: Alert, oriented x 3 (person, place, & time)       Respiratory: No evidence of acute respiratory distress Eyes: PERLA Upper  Extremity (UE) Exam    Side: Right upper extremity  Side: Left upper extremity  Skin & Extremity Inspection: Skin color, temperature, and hair growth are WNL. No peripheral edema or cyanosis. No masses, redness, swelling, asymmetry,  or associated skin lesions. No contractures.  Skin & Extremity Inspection: Skin color, temperature, and hair growth are WNL. No peripheral edema or cyanosis. No masses, redness, swelling, asymmetry, or associated skin lesions. No contractures.  Functional ROM: Pain restricted ROM for shoulder  Functional ROM: Unrestricted ROM          Muscle Tone/Strength: Functionally intact. No obvious neuro-muscular anomalies detected.  Muscle Tone/Strength: Functionally intact. No obvious neuro-muscular anomalies detected.  Sensory (Neurological): Neurogenic pain pattern          Sensory (Neurological): Unimpaired          Palpation: No palpable anomalies              Palpation: No palpable anomalies              Provocative Test(s):  Phalen's test: deferred Tinel's test: deferred Apley's scratch test (touch opposite shoulder):  Action 1 (Across chest): Decreased ROM Action 2 (Overhead): Decreased ROM Action 3 (LB reach): Decreased ROM   Provocative Test(s):  Phalen's test: deferred Tinel's test: deferred Apley's scratch test (touch opposite shoulder):  Action 1 (Across chest): deferred Action 2 (Overhead): deferred Action 3 (LB reach): deferred     Lumbar Spine Area Exam  Skin & Axial Inspection: No masses, redness, or swelling Alignment: Symmetrical Functional ROM: Pain restricted ROM affecting both sides Stability: No instability detected Muscle Tone/Strength: Functionally intact. No obvious neuro-muscular anomalies detected. Sensory (Neurological): Musculoskeletal pain pattern Palpation: No palpable anomalies       Provocative Tests: Hyperextension/rotation test: (+) bilaterally for facet joint pain. Lumbar quadrant test (Kemp's test): (+) bilaterally for facet  joint pain.     Gait & Posture Assessment  Ambulation: Unassisted Gait: Relatively normal for age and body habitus Posture: WNL  Lower Extremity Exam      Side: Right lower extremity   Side: Left lower extremity  Stability: No instability observed           Stability: No instability observed          Skin & Extremity Inspection: Skin color, temperature, and hair growth are WNL. No peripheral edema or cyanosis. No masses, redness, swelling, asymmetry, or associated skin lesions. No contractures.   Skin & Extremity Inspection: Skin color, temperature, and hair growth are WNL. No peripheral edema or cyanosis. No masses, redness, swelling, asymmetry, or associated skin lesions. No contractures.  Functional ROM: Unrestricted ROM                   Functional ROM: Unrestricted ROM                  Muscle Tone/Strength: Functionally intact. No obvious neuro-muscular anomalies detected.   Muscle Tone/Strength: Functionally intact. No obvious neuro-muscular anomalies detected.  Sensory (Neurological): Unimpaired         Sensory (Neurological): Unimpaired        DTR: Patellar: deferred today Achilles: deferred today Plantar: deferred today   DTR: Patellar: deferred today Achilles: deferred today Plantar: deferred today  Palpation: No palpable anomalies   Palpation: No palpable anomalies     Assessment   Diagnosis Status  1. Lumbar facet arthropathy   2. Lumbar spondylosis   3. Chronic pain syndrome   4. Right rotator cuff tear arthropathy    Controlled Controlled Controlled   Updated Problems: Problem  Right Rotator Cuff Tear Arthropathy  Lumbar Facet Arthropathy    Plan of Care   The patient is status post positive diagnostic lumbar medial branch nerve block (Block #  1) at bilateral L3, L4, and L5 levels, with significant temporary pain relief, consistent with facet-mediated low back pain.  We will proceed with a second diagnostic medial branch block at the same levels to confirm  the diagnosis, as per standard protocol. If the patient experiences similar positive response with Block #2, we will then consider proceeding with lumbar medial branch radiofrequency ablation (RFA) for longer-term pain relief.  The patient was counseled on the rationale, goals, and expected outcomes of repeat diagnostic blocks and RFA, and agrees with the plan. Follow-up will be scheduled accordingly.  For his right shoulder pain, we discussed a right suprascapular nerve block as he does have rotator cuff arthropathy and pain with shoulder abduction.  Future considerations could include suprascapular nerve radiofrequency ablation or peripheral nerve stimulation   Ronald Horne has a current medication list which includes the following long-term medication(s): albuterol , amiodarone , atorvastatin , digoxin , eliquis , famotidine, fluticasone-salmeterol, furosemide , levalbuterol, metoprolol , omeprazole, spironolactone , and torsemide .  Pharmacotherapy (Medications Ordered): No orders of the defined types were placed in this encounter.  Orders:  Orders Placed This Encounter  Procedures   LUMBAR FACET(MEDIAL BRANCH NERVE BLOCK) MBNB    Diagnosis: Lumbar Facet Syndrome (M47.816); Lumbosacral Facet Syndrome (M47.817); Lumbar Facet Joint Pain (M54.59) Medical Necessity Statement: 1.Severe chronic axial low back pain causing functional impairment documented by ongoing pain scale assessments. 2.Pain present for longer than 3 months (Chronic) documented to have failed noninvasive conservative therapies. 3.Absence of untreated radiculopathy. 4.There is no radiological evidence of untreated fractures, tumor, infection, or deformity.  Physical Examination Findings: Positive Kemp Maneuver: (Y)  Positive Lumbar Hyperextension-Rotation provocative test: (Y)    Standing Status:   Future    Expiration Date:   12/06/2024    Scheduling Instructions:     Procedure: Lumbar facet Block     Type: Medial Branch  Block     Side: Bilateral     Purpose: Diagnostic Radiologic Mapping     Level(s):L3-4, L4-5,  by Fluoroscopic Mapping Facets (L3, L4, L5, Medial Branch)     Sedation: Patient's choice.     Timeframe: As soon as schedule allows.    Where will this procedure be performed?:   [facility]   SUPRASCAPULAR NERVE BLOCK    For shoulder pain.    Standing Status:   Future    Expiration Date:   12/06/2024    Scheduling Instructions:     Purpose: Diagnostic     Laterality: RIGHT     Level(s): Suprascapular notch     Sedation: Patient's choice.     Scheduling Timeframe: As permitted by the schedule    Where will this procedure be performed?:   [facility]     Bilateral L3, L4, L5 medial branch nerve block 1 08/12/2024, right cervical ESI 08/12/2024   Return in about 11 days (around 09/16/2024) for B/L L3, 4, 5 MBNB #2 and Right SSNB #1, in clinic IV Versed  (stop eliquis  3 days before).    Recent Visits Date Type Provider Dept  08/12/24 Procedure visit Marcelino Nurse, MD [facility]  07/23/24 Office Visit Marcelino Nurse, MD [facility]  Showing recent visits within past 90 days and meeting all other requirements Today's Visits Date Type Provider Dept  09/05/24 Office Visit Marcelino Nurse, MD [facility]  Showing today's visits and meeting all other requirements Future Appointments No visits were found meeting these conditions. Showing future appointments within next 90 days and meeting all other requirements  I discussed the assessment  and treatment plan with the patient. The patient was provided an opportunity to ask questions and all were answered. The patient agreed with the plan and demonstrated an understanding of the instructions.  Patient advised to call back or seek an in-person evaluation if the symptoms or condition worsens.  I personally spent a total of 30 minutes in the care of the patient today including preparing to see the  patient, getting/reviewing separately obtained history, performing a medically appropriate exam/evaluation, counseling and educating, placing orders, and documenting clinical information in the EHR.   Note by: Wallie Sherry, MD (TTS and AI technology used. I apologize for any typographical errors that were not detected and corrected.) Date: 09/05/2024; Time: 10:34 AM

## 2024-09-13 ENCOUNTER — Other Ambulatory Visit: Payer: Self-pay | Admitting: Cardiovascular Disease

## 2024-09-13 DIAGNOSIS — R000 Tachycardia, unspecified: Secondary | ICD-10-CM

## 2024-09-13 DIAGNOSIS — R0789 Other chest pain: Secondary | ICD-10-CM

## 2024-09-13 DIAGNOSIS — G4733 Obstructive sleep apnea (adult) (pediatric): Secondary | ICD-10-CM

## 2024-09-13 DIAGNOSIS — R0602 Shortness of breath: Secondary | ICD-10-CM

## 2024-09-13 DIAGNOSIS — I480 Paroxysmal atrial fibrillation: Secondary | ICD-10-CM

## 2024-09-13 DIAGNOSIS — I200 Unstable angina: Secondary | ICD-10-CM

## 2024-09-13 DIAGNOSIS — I519 Heart disease, unspecified: Secondary | ICD-10-CM

## 2024-09-13 DIAGNOSIS — I5021 Acute systolic (congestive) heart failure: Secondary | ICD-10-CM

## 2024-09-16 ENCOUNTER — Ambulatory Visit (HOSPITAL_BASED_OUTPATIENT_CLINIC_OR_DEPARTMENT_OTHER): Admitting: Student in an Organized Health Care Education/Training Program

## 2024-09-16 ENCOUNTER — Encounter: Payer: Self-pay | Admitting: Student in an Organized Health Care Education/Training Program

## 2024-09-16 ENCOUNTER — Ambulatory Visit
Admission: RE | Admit: 2024-09-16 | Discharge: 2024-09-16 | Disposition: A | Discharge status: Home / Self Care | Source: Ambulatory Visit | Attending: Student in an Organized Health Care Education/Training Program | Admitting: Student in an Organized Health Care Education/Training Program

## 2024-09-16 ENCOUNTER — Other Ambulatory Visit: Payer: Self-pay | Admitting: Student in an Organized Health Care Education/Training Program

## 2024-09-16 VITALS — BP 127/87 | HR 106 | Temp 97.2°F | Resp 17 | Ht 74.0 in | Wt 300.0 lb

## 2024-09-16 DIAGNOSIS — G894 Chronic pain syndrome: Secondary | ICD-10-CM | POA: Insufficient documentation

## 2024-09-16 DIAGNOSIS — M12811 Other specific arthropathies, not elsewhere classified, right shoulder: Secondary | ICD-10-CM | POA: Insufficient documentation

## 2024-09-16 DIAGNOSIS — M75101 Unspecified rotator cuff tear or rupture of right shoulder, not specified as traumatic: Secondary | ICD-10-CM | POA: Insufficient documentation

## 2024-09-16 DIAGNOSIS — M47816 Spondylosis without myelopathy or radiculopathy, lumbar region: Secondary | ICD-10-CM

## 2024-09-16 MED ORDER — DIAZEPAM 5 MG PO TABS
5.0000 mg | ORAL_TABLET | ORAL | Status: AC
  Administered 2024-09-16: 5 mg via ORAL

## 2024-09-16 MED ORDER — DEXAMETHASONE SOD PHOSPHATE PF 10 MG/ML IJ SOLN
20.0000 mg | Freq: Once | INTRAMUSCULAR | Status: AC
  Administered 2024-09-16: 10 mg

## 2024-09-16 MED ORDER — ROPIVACAINE HCL 2 MG/ML IJ SOLN
2.0000 mL | Freq: Once | INTRAMUSCULAR | Status: DC
  Filled 2024-09-16: qty 20

## 2024-09-16 MED ORDER — LIDOCAINE HCL 2 % IJ SOLN
20.0000 mL | Freq: Once | INTRAMUSCULAR | Status: AC
  Administered 2024-09-16: 400 mg
  Filled 2024-09-16: qty 20

## 2024-09-16 MED ORDER — DIAZEPAM 5 MG PO TABS
ORAL_TABLET | ORAL | Status: AC
  Filled 2024-09-16: qty 1

## 2024-09-16 MED ORDER — SODIUM CHLORIDE 0.9% FLUSH: 2.0000 mL | Freq: Once | INTRAVENOUS | Status: DC

## 2024-09-16 MED ORDER — IOHEXOL 180 MG/ML  SOLN
INTRAMUSCULAR | Status: AC
  Filled 2024-09-16: qty 20

## 2024-09-16 NOTE — Progress Notes (Signed)
 Safety precautions to be maintained throughout the outpatient stay will include: orient to surroundings, keep bed in low position, maintain call bell within reach at all times, provide assistance with transfer out of bed and ambulation.

## 2024-09-16 NOTE — Progress Notes (Signed)
 PROVIDER NOTE: Interpretation of information contained herein should be left to medically-trained personnel. Specific patient instructions are provided elsewhere under Patient Instructions section of medical record. This document was created in part using STT-dictation technology, any transcriptional errors that may result from this process are unintentional.  Patient: Ronald Horne Type: Established DOB: 03-27-1951 MRN: [id number] PCP: [name], NP  Service: Procedure DOS: 09/16/2024 Setting: Ambulatory Location: Ambulatory outpatient facility Delivery: Face-to-face Provider: Wallie Sherry, MD Specialty: Interventional Pain Management Specialty designation: 09 Location: Outpatient facility Ref. Prov.: Sherry Wallie, MD       Interventional Therapy   Type: Lumbar Facet, Medial Branch Block(s) (w/ fluoroscopic mapping) #2  Laterality: Bilateral  Level: L3, L4, and L5 Medial Branch/Dorsal Rami Level(s). Injecting these levels blocks the L3-4 and L4-5 lumbar facet joints. Imaging: Fluoroscopic guidance Spinal ([id number]) Anesthesia: Local anesthesia (1-2% Lidocaine) Sedation: PO Valium DOS: 09/16/2024 Performed by: Chevis Weisensel, MD  Patient stopped Eliquis  3 days prior  Primary Purpose: Diagnostic/Therapeutic Indications: Low back pain severe enough to impact quality of life or function. 1. Lumbar facet arthropathy   2. Lumbar spondylosis   3. Chronic pain syndrome    NAS-11 Pain score:   Pre-procedure: 9 /10   Post-procedure: 4 /10     Position / Prep / Materials:  Position: Prone  Prep solution: ChloraPrep (2% chlorhexidine gluconate and 70% isopropyl alcohol) Area Prepped: Posterolateral Lumbosacral Spine (Wide prep: From the lower border of the scapula down to the end of the tailbone and from flank to flank.)  Materials:  Tray: Block Needle(s):  Type: Spinal  Gauge (G): 22  Length: 5-in Qty: 3     H&P (Pre-op Assessment):  Ronald Horne is a 73 y.o. (year old),  male patient, seen today for interventional treatment. He  has a past surgical history that includes Tonsillectomy; Epidural block injection; and LEFT HEART CATH AND CORONARY ANGIOGRAPHY (Left, 12/27/2019). Ronald Horne has a current medication list which includes the following prescription(s): albuterol , amiodarone , apple cider vinegar, atorvastatin , digoxin , eliquis , entresto , famotidine, farxiga , fluticasone-salmeterol, furosemide , hydrocodone-acetaminophen , levalbuterol, methimazole , metoprolol , multivitamin with minerals, ohtuvayre , omeprazole, potassium chloride , spironolactone , and torsemide , and the following Facility-Administered Medications: ropivacaine (pf) 2 mg/ml (0.2%) and sodium chloride  flush. His primarily concern today is the Back Pain (Lower back and neck)  Initial Vital Signs:  Pulse/HCG Rate: (!) 106ECG Heart Rate: (!) 106 Temp: (!) 97.2 F (36.2 C) Resp: 18 BP: 98/78 SpO2: 100 %  BMI: Estimated body mass index is 38.52 kg/m as calculated from the following:   Height as of this encounter: 6' 2 (1.88 m).   Weight as of this encounter: 300 lb (136.1 kg).  Risk Assessment: Allergies: Reviewed. He is allergic to albuterol .  Allergy Precautions: None required Coagulopathies: Reviewed. None identified.  Blood-thinner therapy: None at this time Active Infection(s): Reviewed. None identified. Ronald Horne is afebrile  Site Confirmation: Ronald Horne was asked to confirm the procedure and laterality before marking the site Procedure checklist: Completed Consent: Before the procedure and under the influence of no sedative(s), amnesic(s), or anxiolytics, the patient was informed of the treatment options, risks and possible complications. To fulfill our ethical and legal obligations, as recommended by the American Medical Association's Code of Ethics, I have informed the patient of my clinical impression; the nature and purpose of the treatment or procedure; the risks, benefits, and  possible complications of the intervention; the alternatives, including doing nothing; the risk(s) and benefit(s) of the alternative treatment(s) or procedure(s); and the risk(s) and benefit(s) of doing  nothing. The patient was provided information about the general risks and possible complications associated with the procedure. These may include, but are not limited to: failure to achieve desired goals, infection, bleeding, organ or nerve damage, allergic reactions, paralysis, and death. In addition, the patient was informed of those risks and complications associated to Spine-related procedures, such as failure to decrease pain; infection (i.e.: Meningitis, epidural or intraspinal abscess); bleeding (i.e.: epidural hematoma, subarachnoid hemorrhage, or any other type of intraspinal or peri-dural bleeding); organ or nerve damage (i.e.: Any type of peripheral nerve, nerve root, or spinal cord injury) with subsequent damage to sensory, motor, and/or autonomic systems, resulting in permanent pain, numbness, and/or weakness of one or several areas of the body; allergic reactions; (i.e.: anaphylactic reaction); and/or death. Furthermore, the patient was informed of those risks and complications associated with the medications. These include, but are not limited to: allergic reactions (i.e.: anaphylactic or anaphylactoid reaction(s)); adrenal axis suppression; blood sugar elevation that in diabetics may result in ketoacidosis or comma; water retention that in patients with history of congestive heart failure may result in shortness of breath, pulmonary edema, and decompensation with resultant heart failure; weight gain; swelling or edema; medication-induced neural toxicity; particulate matter embolism and blood vessel occlusion with resultant organ, and/or nervous system infarction; and/or aseptic necrosis of one or more joints. Finally, the patient was informed that Medicine is not an exact science; therefore, there  is also the possibility of unforeseen or unpredictable risks and/or possible complications that may result in a catastrophic outcome. The patient indicated having understood very clearly. We have given the patient no guarantees and we have made no promises. Enough time was given to the patient to ask questions, all of which were answered to the patient's satisfaction. Ronald Horne has indicated that he wanted to continue with the procedure. Attestation: I, the ordering provider, attest that I have discussed with the patient the benefits, risks, side-effects, alternatives, likelihood of achieving goals, and potential problems during recovery for the procedure that I have provided informed consent. Date  Time: 09/16/2024  8:00 AM  Pre-Procedure Preparation:  Monitoring: As per clinic protocol. Respiration, ETCO2, SpO2, BP, heart rate and rhythm monitor placed and checked for adequate function Safety Precautions: Patient was assessed for positional comfort and pressure points before starting the procedure. Time-out: I initiated and conducted the Time-out before starting the procedure, as per protocol. The patient was asked to participate by confirming the accuracy of the Time Out information. Verification of the correct person, site, and procedure were performed and confirmed by me, the nursing staff, and the patient. Time-out conducted as per Joint Commission's Universal Protocol (UP.01.01.01). Time: 0857 Start Time: 0858 hrs.  Description of Procedure:          Laterality: (see above) Targeted Levels: (see above)  Safety Precautions: Aspiration looking for blood return was conducted prior to all injections. At no point did we inject any substances, as a needle was being advanced. Before injecting, the patient was told to immediately notify me if he was experiencing any new onset of ringing in the ears, or metallic taste in the mouth. No attempts were made at seeking any paresthesias. Safe  injection practices and needle disposal techniques used. Medications properly checked for expiration dates. SDV (single dose vial) medications used. After the completion of the procedure, all disposable equipment used was discarded in the proper designated medical waste containers. Local Anesthesia: Protocol guidelines were followed. The patient was positioned over the fluoroscopy table. The area  was prepped in the usual manner. The time-out was completed. The target area was identified using fluoroscopy. A 12-in long, straight, sterile hemostat was used with fluoroscopic guidance to locate the targets for each level blocked. Once located, the skin was marked with an approved surgical skin marker. Once all sites were marked, the skin (epidermis, dermis, and hypodermis), as well as deeper tissues (fat, connective tissue and muscle) were infiltrated with a small amount of a short-acting local anesthetic, loaded on a 10cc syringe with a 25G, 1.5-in  Needle. An appropriate amount of time was allowed for local anesthetics to take effect before proceeding to the next step. Local Anesthetic: Lidocaine 2.0% The unused portion of the local anesthetic was discarded in the proper designated containers. Technical description of process:  Medial Branch  Dorsal Rami Nerve Block (MBB):  Neuroanatomy note: Each lumbar facet joint receives dual innervation from medial branches arising from the posterior primary rami at the same level and one level above. The target for each lumbar medial branch is the junction of the ipsilateral superior articular and transverse process of the lower vertebral body. (i.e.: The L4-L5 facet joint is innervated by the L4 medial branch located at L5 and the L3 medial branch located at L4. Blocking the L4 Medial Branch is therefore achieved by injecting at the junction of the ipsilateral superior articular and transverse process of the lower vertebral body L5.).  Exception: The exception to the  above rule is the L5-S1 facet joint which has triple innervation requiring the L4 medial branch, as well as the L5 and the S1 Dorsal Rami(s) to be blocked to fully denervate the joint.  Under fluoroscopic guidance, a needle was inserted until contact was made with os over the target area. After negative aspiration, 2mL of the nerve block solution was injected without difficulty or complication. Paresthesia were avoided during injection. The needle(s) were removed intact and without complication.  Once the entire procedure was completed, the treated area was cleaned, making sure to leave some of the prepping solution back to take advantage of its long term bactericidal properties.         Illustration of the posterior view of the lumbar spine and the posterior neural structures. Laminae of L2 through S1 are labeled. DPRL5, dorsal primary ramus of L5; DPRS1, dorsal primary ramus of S1; DPR3, dorsal primary ramus of L3; FJ, facet (zygapophyseal) joint L3-L4; I, inferior articular process of L4; LB1, lateral branch of dorsal primary ramus of L1; IAB, inferior articular branches from L3 medial branch (supplies L4-L5 facet joint); IBP, intermediate branch plexus; MB3, medial branch of dorsal primary ramus of L3; NR3, third lumbar nerve root; S, superior articular process of L5; SAB, superior articular branches from L4 (supplies L4-5 facet joint also); TP3, transverse process of L3.   Facet Joint Innervation (* possible contribution)  L1-2 T12, L1 (L2*)  Medial Branch  L2-3 L1, L2 (L3*)                     L3-4 L2, L3 (L4*)                     L4-5 L3, L4 (L5*)                     L5-S1 L4, L5, S1                        Vitals:   09/16/24 0859 09/16/24  9094 09/16/24 0910 09/16/24 0912  BP: (!) 116/95 (!) 119/96 (!) 128/97 127/87  Pulse:      Resp: 18 17 18 17   Temp:      SpO2: 99% 96% 96% 100%  Weight:      Height:         End Time: 0912 hrs.  Imaging Guidance (Spinal):          Type of Imaging Technique: Fluoroscopy Guidance (Spinal) Indication(s): Fluoroscopy guidance for needle placement to enhance accuracy in procedures requiring precise needle localization for targeted delivery of medication in or near specific anatomical locations not easily accessible without such real-time imaging assistance. Exposure Time: Please see nurses notes. Contrast: None used. Fluoroscopic Guidance: I was personally present during the use of fluoroscopy. Tunnel Vision Technique used to obtain the best possible view of the target area. Parallax error corrected before commencing the procedure. Direction-depth-direction technique used to introduce the needle under continuous pulsed fluoroscopy. Once target was reached, antero-posterior, oblique, and lateral fluoroscopic projection used confirm needle placement in all planes. Images permanently stored in EMR. Interpretation: No contrast injected. I personally interpreted the imaging intraoperatively. Adequate needle placement confirmed in multiple planes. Permanent images saved into the patient's record.  Post-operative Assessment:  Post-procedure Vital Signs:  Pulse/HCG Rate: (!) 106(!) 104 Temp: (!) 97.2 F (36.2 C) Resp: 17 BP: 127/87 SpO2: 100 %  EBL: None  Complications: No immediate post-treatment complications observed by team, or reported by patient.  Note: The patient tolerated the entire procedure well. A repeat set of vitals were taken after the procedure and the patient was kept under observation following institutional policy, for this type of procedure. Post-procedural neurological assessment was performed, showing return to baseline, prior to discharge. The patient was provided with post-procedure discharge instructions, including a section on how to identify potential problems. Should any problems arise concerning this procedure, the patient was given instructions to immediately contact us , at any time, without  hesitation. In any case, we plan to contact the patient by telephone for a follow-up status report regarding this interventional procedure.  Comments:  No additional relevant information.  Plan of Care (POC)  Orders:  No orders of the defined types were placed in this encounter.    Medications ordered for procedure: Meds ordered this encounter  Medications   lidocaine (XYLOCAINE) 2 % (with pres) injection 400 mg   diazepam (VALIUM) tablet 5 mg    Make sure Flumazenil is available in the pyxis when using this medication. If oversedation occurs, administer 0.2 mg IV over 15 sec. If after 45 sec no response, administer 0.2 mg again over 1 min; may repeat at 1 min intervals; not to exceed 4 doses (1 mg)   sodium chloride  flush (NS) 0.9 % injection 2 mL    This is for a two (2) level block. Use two (2) syringes and divide content in half.   ropivacaine (PF) 2 mg/mL (0.2%) (NAROPIN) injection 2 mL    This is for a two (2) level block. Use two (2) syringes and divide content in half.   dexamethasone  (DECADRON ) injection 20 mg    This is for a two (2) level block. Use two (2) syringes and divide content in half.   Medications administered: We administered lidocaine, diazepam, and dexamethasone .  See the medical record for exact dosing, route, and time of administration.    Bilateral L3, L4, L5 medial branch nerve block #1 08/12/2024, #2: 09/16/24, right cervical ESI 08/12/2024 Right SSNB #1 09/16/24  Follow-up plan:   Return in about 4 weeks (around 10/14/2024) for PPE, F2F, Seema (discuss lumbar RFA and suprscapular RFA).     Recent Visits Date Type Provider Dept  09/05/24 Office Visit Marcelino Nurse, MD [facility]  08/12/24 Procedure visit Marcelino Nurse, MD [facility]  07/23/24 Office Visit Marcelino Nurse, MD [facility]  Showing recent visits within past 90 days and meeting all other requirements Today's Visits Date Type Provider Dept  09/16/24  Procedure visit Marcelino Nurse, MD [facility]  Showing today's visits and meeting all other requirements Future Appointments Date Type Provider Dept  10/14/24 Appointment Patel, Seema K, NP [facility]  Showing future appointments within next 90 days and meeting all other requirements   Disposition: Discharge home  Discharge (Date  Time): 09/16/2024; 0930 hrs.   Primary Care Physician: [name], NP Location: [facility] Note by: Nurse Marcelino, MD (TTS technology used. I apologize for any typographical errors that were not detected and corrected.) Date: 09/16/2024; Time: 1:45 PM  Disclaimer:  Medicine is not an visual merchandiser. The only guarantee in medicine is that nothing is guaranteed. It is important to note that the decision to proceed with this intervention was based on the information collected from the patient. The Data and conclusions were drawn from the patient's questionnaire, the interview, and the physical examination. Because the information was provided in large part by the patient, it cannot be guaranteed that it has not been purposely or unconsciously manipulated. Every effort has been made to obtain as much relevant data as possible for this evaluation. It is important to note that the conclusions that lead to this procedure are derived in large part from the available data. Always take into account that the treatment will also be dependent on availability of resources and existing treatment guidelines, considered by other Pain Management Practitioners as being common knowledge and practice, at the time of the intervention. For Medico-Legal purposes, it is also important to point out that variation in procedural techniques and pharmacological choices are the acceptable norm. The indications, contraindications, technique, and results of the above procedure should only be interpreted and judged by a Board-Certified Interventional  Pain Specialist with extensive familiarity and expertise in the same exact procedure and technique.

## 2024-09-16 NOTE — Patient Instructions (Signed)

## 2024-09-16 NOTE — Progress Notes (Signed)
 PROVIDER NOTE: Interpretation of information contained herein should be left to medically-trained personnel. Specific patient instructions are provided elsewhere under Patient Instructions section of medical record. This document was created in part using STT-dictation technology, any transcriptional errors that may result from this process are unintentional.  Patient: Ronald Horne Type: Established DOB: [birth year] MRN: [id number] PCP: [name], NP  Service: Procedure DOS: 09/16/2024 Setting: Ambulatory Location: Ambulatory outpatient facility Delivery: Face-to-face Provider: Wallie Sherry, MD Specialty: Interventional Pain Management Specialty designation: 09 Location: Outpatient facility Ref. Prov.: Sherry Wallie, MD       Interventional Therapy   Procedure: Suprascapular nerve block (SSNB) #1  Laterality:  Right  Level: Superior to scapular spine, lateral to supraspinatus fossa (Suprascapular notch).  Imaging: Fluoroscopic guidance         Anesthesia: Local anesthesia (1-2% Lidocaine) Anxiolysis:PO Valium DOS: 09/16/2024  Performed by: Wallie Sherry, MD  Purpose: Diagnostic/Therapeutic Indications: Shoulder pain, severe enough to impact quality of life and/or function. 1. Lumbar facet arthropathy   2. Lumbar spondylosis   3. Chronic pain syndrome   4. Right rotator cuff tear arthropathy    NAS-11 score:   Pre-procedure: 9 /10   Post-procedure: 4 /10     Target: Suprascapular nerve Location: midway between the medial border of the scapula and the acromion as it runs through the suprascapular notch. Region: Suprascapular, posterior shoulder  Approach: Percutaneous  Neuroanatomy: The suprascapular nerve is the lateral branch of the superior trunk of the brachial plexus. It receives nerve fibers that originate in the nerve roots C5 and C6 (and sometimes C4). It is a mixed nerve, meaning that it provides both sensory and motor supply for the suprascapular  region. Function: The main function of this nerve is to provide motor innervation for two muscles, the supraspinatus and infraspinatus muscles. They are part of the rotator cuff muscles. In addition, the suprascapular nerve provides a sensory supply to the joints of the scapula (glenohumeral and acromioclavicular joints). Rationale (medical necessity): procedure needed and proper for the diagnosis and/or treatment of the patient's medical symptoms and needs.  Position / Prep / Materials:  Position: Prone Materials:  Tray: Block Needle(s):  Type: Spinal  Gauge (G): 22  Length: 3.5 in.  Qty: 1 Prep solution: ChloraPrep (2% chlorhexidine gluconate and 70% isopropyl alcohol) Prep Area: Entire posterior shoulder area. From upper spine to shoulder proper (upper arm), and from lateral neck to lower tip of shoulder blade.   H&P (Pre-op Assessment):  Ronald Horne is a 73 y.o. (year old), male patient, seen today for interventional treatment. He  has a past surgical history that includes Tonsillectomy; Epidural block injection; and LEFT HEART CATH AND CORONARY ANGIOGRAPHY (Left, 12/27/2019). [name] has a current medication list which includes the following prescription(s): albuterol , amiodarone , apple cider vinegar, atorvastatin , digoxin , eliquis , entresto , famotidine, farxiga , fluticasone-salmeterol, furosemide , hydrocodone-acetaminophen , levalbuterol, methimazole , metoprolol , multivitamin with minerals, ohtuvayre , omeprazole, potassium chloride , spironolactone , and torsemide , and the following Facility-Administered Medications: ropivacaine (pf) 2 mg/ml (0.2%) and sodium chloride  flush. His primarily concern today is the Back Pain (Lower back and neck)  Initial Vital Signs:  Pulse/HCG Rate: (!) 106ECG Heart Rate: (!) 106 Temp: (!) 97.2 F (36.2 C) Resp: 18 BP: 98/78 SpO2: 100 %  BMI: Estimated body mass index is 38.52 kg/m as calculated from the following:   Height as of this encounter: 6' 2  (1.88 m).   Weight as of this encounter: 300 lb (136.1 kg).  Risk Assessment: Allergies: Reviewed. He is allergic to albuterol .  Allergy Precautions: None required Coagulopathies: Reviewed. None identified.  Blood-thinner therapy: None at this time Active Infection(s): Reviewed. None identified. [name] is afebrile  Site Confirmation: Ronald Horne was asked to confirm the procedure and laterality before marking the site Procedure checklist: Completed Consent: Before the procedure and under the influence of no sedative(s), amnesic(s), or anxiolytics, the patient was informed of the treatment options, risks and possible complications. To fulfill our ethical and legal obligations, as recommended by the American Medical Association's Code of Ethics, I have informed the patient of my clinical impression; the nature and purpose of the treatment or procedure; the risks, benefits, and possible complications of the intervention; the alternatives, including doing nothing; the risk(s) and benefit(s) of the alternative treatment(s) or procedure(s); and the risk(s) and benefit(s) of doing nothing. The patient was provided information about the general risks and possible complications associated with the procedure. These may include, but are not limited to: failure to achieve desired goals, infection, bleeding, organ or nerve damage, allergic reactions, paralysis, and death. In addition, the patient was informed of those risks and complications associated to the procedure, such as failure to decrease pain; infection; bleeding; organ or nerve damage with subsequent damage to sensory, motor, and/or autonomic systems, resulting in permanent pain, numbness, and/or weakness of one or several areas of the body; allergic reactions; (i.e.: anaphylactic reaction); and/or death. Furthermore, the patient was informed of those risks and complications associated with the medications. These include, but are not limited to:  allergic reactions (i.e.: anaphylactic or anaphylactoid reaction(s)); adrenal axis suppression; blood sugar elevation that in diabetics may result in ketoacidosis or comma; water retention that in patients with history of congestive heart failure may result in shortness of breath, pulmonary edema, and decompensation with resultant heart failure; weight gain; swelling or edema; medication-induced neural toxicity; particulate matter embolism and blood vessel occlusion with resultant organ, and/or nervous system infarction; and/or aseptic necrosis of one or more joints. Finally, the patient was informed that Medicine is not an exact science; therefore, there is also the possibility of unforeseen or unpredictable risks and/or possible complications that may result in a catastrophic outcome. The patient indicated having understood very clearly. We have given the patient no guarantees and we have made no promises. Enough time was given to the patient to ask questions, all of which were answered to the patient's satisfaction. Mr. Anfinson has indicated that he wanted to continue with the procedure. Attestation: I, the ordering provider, attest that I have discussed with the patient the benefits, risks, side-effects, alternatives, likelihood of achieving goals, and potential problems during recovery for the procedure that I have provided informed consent. Date  Time: 09/16/2024  8:00 AM  Pre-Procedure Preparation:  Monitoring: As per clinic protocol. Respiration, ETCO2, SpO2, BP, heart rate and rhythm monitor placed and checked for adequate function Safety Precautions: Patient was assessed for positional comfort and pressure points before starting the procedure. Time-out: I initiated and conducted the Time-out before starting the procedure, as per protocol. The patient was asked to participate by confirming the accuracy of the Time Out information. Verification of the correct person, site, and procedure were  performed and confirmed by me, the nursing staff, and the patient. Time-out conducted as per Joint Commission's Universal Protocol (UP.01.01.01). Time: 0857 Start Time: 0858 hrs.  Description of Procedure:          Procedural Technique Safety Precautions: Aspiration looking for blood return was conducted prior to all injections. At no point did we inject any  substances, as a needle was being advanced. No attempts were made at seeking any paresthesias. Safe injection practices and needle disposal techniques used. Medications properly checked for expiration dates. SDV (single dose vial) medications used. Description of the Procedure: Protocol guidelines were followed. The patient was placed in position over the procedure table. The target area was identified and the area prepped in the usual manner. Skin & deeper tissues infiltrated with local anesthetic. Appropriate amount of time allowed to pass for local anesthetics to take effect. The procedure needles were then advanced to the target area. Proper needle placement secured. Negative aspiration confirmed. Solution injected in intermittent fashion, asking for systemic symptoms every 0.5cc of injectate. The needles were then removed and the area cleansed, making sure to leave some of the prepping solution back to take advantage of its long term bactericidal properties.  5 cc solution made of 4 cc of 0.2% ropivacaine, 1 cc of Decadron  10 mg/cc.  Injected along the right suprascapular notch after contrast confirmation  Vitals:   09/16/24 0859 09/16/24 0905 09/16/24 0910 09/16/24 0912  BP: (!) 116/95 (!) 119/96 (!) 128/97 127/87  Pulse:      Resp: 18 17 18 17   Temp:      SpO2: 99% 96% 96% 100%  Weight:      Height:         Start Time: 0858 hrs. End Time: 0912 hrs.  Imaging Guidance (Spinal):         Type of Imaging Technique: Fluoroscopy Guidance (Spinal) Indication(s): Fluoroscopy guidance for needle placement to enhance accuracy in  procedures requiring precise needle localization for targeted delivery of medication in or near specific anatomical locations not easily accessible without such real-time imaging assistance. Exposure Time: Please see nurses notes. Contrast: Before injecting any contrast, we confirmed that the patient did not have an allergy to iodine, shellfish, or radiological contrast. Once satisfactory needle placement was completed at the desired level, radiological contrast was injected. Contrast injected under live fluoroscopy. No contrast complications. See chart for type and volume of contrast used. Fluoroscopic Guidance: I was personally present during the use of fluoroscopy. Tunnel Vision Technique used to obtain the best possible view of the target area. Parallax error corrected before commencing the procedure. Direction-depth-direction technique used to introduce the needle under continuous pulsed fluoroscopy. Once target was reached, antero-posterior, oblique, and lateral fluoroscopic projection used confirm needle placement in all planes. Images permanently stored in EMR. Interpretation: I personally interpreted the imaging intraoperatively. Adequate needle placement confirmed in multiple planes. Appropriate spread of contrast into desired area was observed. No evidence of afferent or efferent intravascular uptake. No intrathecal or subarachnoid spread observed. Permanent images saved into the patient's record.  Antibiotic Prophylaxis:   Anti-infectives (From admission, onward)    None      Indication(s): None identified  Post-operative Assessment:  Post-procedure Vital Signs:  Pulse/HCG Rate: (!) 106(!) 104 Temp: (!) 97.2 F (36.2 C) Resp: 17 BP: 127/87 SpO2: 100 %  EBL: None  Complications: No immediate post-treatment complications observed by team, or reported by patient.  Note: The patient tolerated the entire procedure well. A repeat set of vitals were taken after the procedure and  the patient was kept under observation following institutional policy, for this type of procedure. Post-procedural neurological assessment was performed, showing return to baseline, prior to discharge. The patient was provided with post-procedure discharge instructions, including a section on how to identify potential problems. Should any problems arise concerning this procedure, the patient was given  instructions to immediately contact us , at any time, without hesitation. In any case, we plan to contact the patient by telephone for a follow-up status report regarding this interventional procedure.  Comments:  No additional relevant information.  Plan of Care (POC)  Orders:  No orders of the defined types were placed in this encounter.  Medications ordered for procedure: Meds ordered this encounter  Medications   lidocaine (XYLOCAINE) 2 % (with pres) injection 400 mg   diazepam (VALIUM) tablet 5 mg    Make sure Flumazenil is available in the pyxis when using this medication. If oversedation occurs, administer 0.2 mg IV over 15 sec. If after 45 sec no response, administer 0.2 mg again over 1 min; may repeat at 1 min intervals; not to exceed 4 doses (1 mg)   sodium chloride  flush (NS) 0.9 % injection 2 mL    This is for a two (2) level block. Use two (2) syringes and divide content in half.   ropivacaine (PF) 2 mg/mL (0.2%) (NAROPIN) injection 2 mL    This is for a two (2) level block. Use two (2) syringes and divide content in half.   dexamethasone  (DECADRON ) injection 20 mg    This is for a two (2) level block. Use two (2) syringes and divide content in half.   Medications administered: We administered lidocaine, diazepam, and dexamethasone .  See the medical record for exact dosing, route, and time of administration.    Follow-up plan:   Return in about 4 weeks (around 10/14/2024) for PPE, F2F, Seema (discuss lumbar RFA and suprscapular RFA).     Recent Visits Date Type Provider Dept   09/05/24 Office Visit Marcelino Nurse, MD [facility]  08/12/24 Procedure visit Marcelino Nurse, MD [facility]  07/23/24 Office Visit Marcelino Nurse, MD [facility]  Showing recent visits within past 90 days and meeting all other requirements Today's Visits Date Type Provider Dept  09/16/24 Procedure visit Marcelino Nurse, MD [facility]  Showing today's visits and meeting all other requirements Future Appointments Date Type Provider Dept  10/14/24 Appointment Patel, Seema K, NP [facility]  Showing future appointments within next 90 days and meeting all other requirements   Disposition: Discharge home  Discharge (Date  Time): 09/16/2024; 0930 hrs.   Primary Care Physician: [name], NP Location: [facility] Note by: Nurse Marcelino, MD (TTS technology used. I apologize for any typographical errors that were not detected and corrected.) Date: 09/16/2024; Time: 1:47 PM  Disclaimer:  Medicine is not an visual merchandiser. The only guarantee in medicine is that nothing is guaranteed. It is important to note that the decision to proceed with this intervention was based on the information collected from the patient. The Data and conclusions were drawn from the patient's questionnaire, the interview, and the physical examination. Because the information was provided in large part by the patient, it cannot be guaranteed that it has not been purposely or unconsciously manipulated. Every effort has been made to obtain as much relevant data as possible for this evaluation. It is important to note that the conclusions that lead to this procedure are derived in large part from the available data. Always take into account that the treatment will also be dependent on availability of resources and existing treatment guidelines, considered by other Pain Management Practitioners as being common knowledge and practice, at the time of the  intervention. For Medico-Legal purposes, it is also important to point out  that variation in procedural techniques and pharmacological choices are the acceptable norm. The indications, contraindications, technique, and results of the above procedure should only be interpreted and judged by a Board-Certified Interventional Pain Specialist with extensive familiarity and expertise in the same exact procedure and technique.

## 2024-09-17 ENCOUNTER — Telehealth: Payer: Self-pay | Admitting: *Deleted

## 2024-09-17 NOTE — Telephone Encounter (Signed)
 Called for post procedure follow-up. Message left.

## 2024-09-24 ENCOUNTER — Other Ambulatory Visit: Payer: Self-pay | Admitting: Cardiovascular Disease

## 2024-10-05 ENCOUNTER — Emergency Department

## 2024-10-05 ENCOUNTER — Emergency Department
Admission: EM | Admit: 2024-10-05 | Discharge: 2024-10-05 | Disposition: A | Discharge status: Home / Self Care | Attending: Emergency Medicine | Admitting: Emergency Medicine

## 2024-10-05 DIAGNOSIS — I2510 Atherosclerotic heart disease of native coronary artery without angina pectoris: Secondary | ICD-10-CM | POA: Insufficient documentation

## 2024-10-05 DIAGNOSIS — R0789 Other chest pain: Secondary | ICD-10-CM | POA: Insufficient documentation

## 2024-10-05 DIAGNOSIS — I509 Heart failure, unspecified: Secondary | ICD-10-CM | POA: Insufficient documentation

## 2024-10-05 DIAGNOSIS — I110 Hypertensive heart disease with heart failure: Secondary | ICD-10-CM | POA: Insufficient documentation

## 2024-10-05 DIAGNOSIS — R7989 Other specified abnormal findings of blood chemistry: Secondary | ICD-10-CM | POA: Insufficient documentation

## 2024-10-05 DIAGNOSIS — R079 Chest pain, unspecified: Secondary | ICD-10-CM

## 2024-10-05 DIAGNOSIS — E039 Hypothyroidism, unspecified: Secondary | ICD-10-CM | POA: Insufficient documentation

## 2024-10-05 DIAGNOSIS — J449 Chronic obstructive pulmonary disease, unspecified: Secondary | ICD-10-CM | POA: Insufficient documentation

## 2024-10-05 LAB — BASIC METABOLIC PANEL WITH GFR
Anion gap: 13 (ref 5–15)
BUN: 9 mg/dL (ref 8–23)
CO2: 22 mmol/L (ref 22–32)
Calcium: 8.7 mg/dL — ABNORMAL LOW (ref 8.9–10.3)
Chloride: 101 mmol/L (ref 98–111)
Creatinine, Ser: 1.11 mg/dL (ref 0.61–1.24)
GFR, Estimated: >60 mL/min (ref >60)
Glucose, Bld: 147 mg/dL — ABNORMAL HIGH (ref 70–99)
Potassium: 4.1 mmol/L (ref 3.5–5.1)
Sodium: 136 mmol/L (ref 135–145)

## 2024-10-05 LAB — CBC
HCT: 42.8 % (ref 39.0–52.0)
Hemoglobin: 14.3 g/dL (ref 13.0–17.0)
MCH: 30.2 pg (ref 26.0–34.0)
MCHC: 33.4 g/dL (ref 30.0–36.0)
MCV: 90.5 fL (ref 80.0–100.0)
Platelets: 213 K/uL (ref 150–400)
RBC: 4.73 MIL/uL (ref 4.22–5.81)
RDW: 14.6 % (ref 11.5–15.5)
WBC: 5.9 K/uL (ref 4.0–10.5)
nRBC: 0 % (ref 0.0–0.2)

## 2024-10-05 LAB — TROPONIN T, HIGH SENSITIVITY: Troponin T High Sensitivity: 23 ng/L — ABNORMAL HIGH (ref 0–19)

## 2024-10-05 NOTE — ED Notes (Addendum)
 Patient transported to X-ray

## 2024-10-05 NOTE — ED Triage Notes (Signed)
 Pt. In via POV from home, reports chest pain described as tightness since 7 pm, endorses some SOB as well, states laying on his right side makes it worse but laying flat it feels ok

## 2024-10-05 NOTE — ED Notes (Addendum)
 PT states sharp chest pain started 7pm today. Exhibits dyspnea on exertion. Denies smoking. States he took a nebulizer tx today at 0730. States he takes lopressor , eliquis , digoxin  and entresto  at 1600.

## 2024-10-05 NOTE — ED Provider Notes (Signed)
 [facility] Provider Note   Event Date/Time   First MD Initiated Contact with Patient 10/05/24 2051     (approximate) History  Chest Pain  HPI Ronald Horne is a 73 y.o. male with a past medical history of CHF, CAD, obesity, hypertension, hypothyroidism, and COPD who presents complaining of left-sided chest tightness that is worse when he rolls onto his right side.  Patient denies any other exacerbating or relieving factors.  Patient states that he has taken aspirin  and nitroglycerin with no relief in his pain.  Patient denies any pain similar to this in the past.  Patient notes that his blood pressure is always low and is concerned that this may be a cause for his chest pressure.  Patient states that his blood pressure is normally 80s/60s.  Patient denies any associated shortness of breath ROS: Patient currently denies any vision changes, tinnitus, difficulty speaking, facial droop, sore throat, shortness of breath, abdominal pain, nausea/vomiting/diarrhea, dysuria, or weakness/numbness/paresthesias in any extremity   Physical Exam  Triage Vital Signs: ED Triage Vitals  Encounter Vitals Group     BP 10/05/24 2037 106/70     Girls Systolic BP Percentile --      Girls Diastolic BP Percentile --      Boys Systolic BP Percentile --      Boys Diastolic BP Percentile --      Pulse Rate 10/05/24 2037 96     Resp 10/05/24 2037 18     Temp 10/05/24 2037 (!) 97.5 F (36.4 C)     Temp Source 10/05/24 2037 Oral     SpO2 10/05/24 2037 98 %     Weight 10/05/24 2034 300 lb (136.1 kg)     Height 10/05/24 2034 6' 2 (1.88 m)     Head Circumference --      Peak Flow --      Pain Score 10/05/24 2034 7     Pain Loc --      Pain Education --      Exclude from Growth Chart --    Most recent vital signs: Vitals:   10/05/24 2103 10/05/24 2151  BP: 92/74 (!) 82/68  Pulse: 91 84  Resp: 20 18  Temp: 97.8 F (36.6 C)   SpO2: 99% 100%   General: Awake, oriented  x4. CV:  Good peripheral perfusion.  No MGR Resp:  Normal effort.  CTAB Abd:  No distention. Other:  Elderly obese African-American male resting comfortably in no acute distress. ED Results / Procedures / Treatments  Labs (all labs ordered are listed, but only abnormal results are displayed) Labs Reviewed  BASIC METABOLIC PANEL WITH GFR - Abnormal; Notable for the following components:      Result Value   Glucose, Bld 147 (*)    Calcium  8.7 (*)    All other components within normal limits  TROPONIN T, HIGH SENSITIVITY - Abnormal; Notable for the following components:   Troponin T High Sensitivity 23 (*)    All other components within normal limits  CBC  TROPONIN T, HIGH SENSITIVITY   EKG ED ECG REPORT I, Artist MARLA Kerns, the attending physician, personally viewed and interpreted this ECG. Date: 10/05/2024 EKG Time: 2034 Rate: 101 Rhythm: Tachycardic sinus rhythm QRS Axis: normal Intervals: Left bundle branch block ST/T Wave abnormalities: normal Narrative Interpretation: Tachycardic sinus rhythm with left bundle branch block and no evidence of acute ischemia RADIOLOGY ED MD interpretation: 2 view chest x-ray shows mild cardiomegaly - All radiology independently  interpreted and agree with radiology assessment Official radiology report(s): DG Chest 2 View Result Date: 10/05/2024 EXAM: 2 VIEW(S) XRAY OF THE CHEST 10/05/2024 08:59:43 PM COMPARISON: 05/22/2024 CLINICAL HISTORY: chest pain FINDINGS: LUNGS AND PLEURA: No focal pulmonary opacity. No pleural effusion. No pneumothorax. HEART AND MEDIASTINUM: Heart mildly enlarged. BONES AND SOFT TISSUES: No acute osseous abnormality. IMPRESSION: 1. No acute cardiopulmonary process identified. 2. Mild cardiomegaly. Electronically signed by: Franky Crease MD 10/05/2024 09:01 PM EST RP Workstation: [id number]   PROCEDURES: Critical Care performed: No Procedures MEDICATIONS ORDERED IN ED: Medications - No data to display IMPRESSION / MDM /  ASSESSMENT AND PLAN / ED COURSE  I reviewed the triage vital signs and the nursing notes.                             The patient is on the cardiac monitor to evaluate for evidence of arrhythmia and/or significant heart rate changes. Patient's presentation is most consistent with acute presentation with potential threat to life or bodily function. Patient is 73 year old male with the above-stated past medical history presents complaining of left-sided chest pain that is positional DDx: ACS, PE, pneumonia, pneumothorax Plan: CBC, BMP, troponin, chest x-ray, EKG  Based on physical exam and radiologic/laboratory evaluation, patient has no evidence of acute abnormalities.  Patient has elevated troponin that is at his baseline.  Patient's chest x-ray shows mild cardiomegaly.  Patient's pain is positional and therefore I have lower concern for cardiac related pain at this time however patient was encouraged to follow-up with his outpatient cardiologist, Dr. Nelida, for further evaluation.  Patient agrees with plan for discharge at this time with outpatient follow-up as needed.  Patient given strict return precautions and all questions answered prior to discharge  Dispo: Discharge Home with cardiology follow-up as needed   FINAL CLINICAL IMPRESSION(S) / ED DIAGNOSES   Final diagnoses:  Left-sided chest pain   Rx / DC Orders   ED Discharge Orders     None      Note:  This document was prepared using Dragon voice recognition software and may include unintentional dictation errors.   Jossie Artist POUR, MD 10/05/24 [phone]

## 2024-10-05 NOTE — ED Notes (Signed)
 Per EDP Bradler, okay tp D/C pt with current BP reading.

## 2024-10-06 ENCOUNTER — Emergency Department
Admission: EM | Admit: 2024-10-06 | Discharge: 2024-10-06 | Disposition: A | Discharge status: Home / Self Care | Attending: Emergency Medicine | Admitting: Emergency Medicine

## 2024-10-06 ENCOUNTER — Other Ambulatory Visit: Payer: Self-pay

## 2024-10-06 DIAGNOSIS — I509 Heart failure, unspecified: Secondary | ICD-10-CM | POA: Insufficient documentation

## 2024-10-06 DIAGNOSIS — R0602 Shortness of breath: Secondary | ICD-10-CM | POA: Insufficient documentation

## 2024-10-06 DIAGNOSIS — I4891 Unspecified atrial fibrillation: Secondary | ICD-10-CM | POA: Insufficient documentation

## 2024-10-06 DIAGNOSIS — I2510 Atherosclerotic heart disease of native coronary artery without angina pectoris: Secondary | ICD-10-CM | POA: Insufficient documentation

## 2024-10-06 DIAGNOSIS — Z7901 Long term (current) use of anticoagulants: Secondary | ICD-10-CM | POA: Insufficient documentation

## 2024-10-06 DIAGNOSIS — J449 Chronic obstructive pulmonary disease, unspecified: Secondary | ICD-10-CM | POA: Insufficient documentation

## 2024-10-06 DIAGNOSIS — I110 Hypertensive heart disease with heart failure: Secondary | ICD-10-CM | POA: Insufficient documentation

## 2024-10-06 LAB — PRO BRAIN NATRIURETIC PEPTIDE: Pro Brain Natriuretic Peptide: 1865 pg/mL — ABNORMAL HIGH (ref <300.0)

## 2024-10-06 LAB — LIPASE, BLOOD: Lipase: 20 U/L (ref 11–51)

## 2024-10-06 LAB — TROPONIN T, HIGH SENSITIVITY: Troponin T High Sensitivity: 24 ng/L — ABNORMAL HIGH (ref 0–19)

## 2024-10-06 MED ORDER — FUROSEMIDE 10 MG/ML IJ SOLN: 40.0000 mg | Freq: Once | INTRAMUSCULAR | Status: DC

## 2024-10-06 MED ORDER — FUROSEMIDE 10 MG/ML IJ SOLN
80.0000 mg | Freq: Once | INTRAMUSCULAR | Status: DC
  Filled 2024-10-06: qty 8

## 2024-10-06 NOTE — ED Notes (Addendum)
 Pt seen here earlier and discharged. Pt got home and had the same pain he was seen here for earlier, so he came back due to pain. Pt states pain exacerbates when he is lying on right side..  pt c/o chest tightness at this time.

## 2024-10-06 NOTE — ED Provider Notes (Signed)
 [facility] Provider Note    Event Date/Time   First MD Initiated Contact with Patient 10/06/24 [phone]     (approximate)   History   Shortness of Breath   HPI  Bishop Vanderwerf is a 73 y.o. male   Past medical history of CHF, COPD, CAD, obesity and hypertension, atrial fibrillation on Eliquis  who presents back to the emergency department after being evaluated and discharged earlier for chest pain.  He feels fluid in my chest and chest discomfort.  He denies any respiratory infectious symptoms like cough or fever.  He states that he was off of his Eliquis  3 days last week for procedure but has been fully compliant since then.  He denies any unilateral leg swelling.  He thinks that he has the beginning of a pulmonary edema episode, has had these in the past requiring diuretics. External Medical Documents Reviewed: Cardiology notes from November and recent Emergency Department note      Physical Exam   Triage Vital Signs: ED Triage Vitals  Encounter Vitals Group     BP 10/06/24 0016 93/68     Girls Systolic BP Percentile --      Girls Diastolic BP Percentile --      Boys Systolic BP Percentile --      Boys Diastolic BP Percentile --      Pulse Rate 10/06/24 0016 93     Resp 10/06/24 0016 (!) 22     Temp 10/06/24 0016 97.8 F (36.6 C)     Temp Source 10/06/24 0016 Oral     SpO2 10/06/24 0016 97 %     Weight 10/06/24 0018 300 lb (136.1 kg)     Height 10/06/24 0018 6' 2 (1.88 m)     Head Circumference --      Peak Flow --      Pain Score 10/06/24 0018 8     Pain Loc --      Pain Education --      Exclude from Growth Chart --     Most recent vital signs: Vitals:   10/06/24 0159 10/06/24 0200  BP: (!) 89/66 (!) 85/58  Pulse: 83 83  Resp: (!) 31 (!) 22  Temp:    SpO2: 96% 95%    General: Awake, no distress.  CV:  Good peripheral perfusion.  Resp:  Normal effort.  Abd:  No distention.  Other:  No respiratory distress.  No significant  rales or focality on respiratory exam.  Oxygenation 95% on room air.  Blood pressure is on the lower side but consistent with multiple prior readings on outpatient notes documented around the same blood pressure.  Afebrile.   ED Results / Procedures / Treatments   Labs (all labs ordered are listed, but only abnormal results are displayed) Labs Reviewed  PRO BRAIN NATRIURETIC PEPTIDE - Abnormal; Notable for the following components:      Result Value   Pro Brain Natriuretic Peptide 1,865.0 (*)    All other components within normal limits  TROPONIN T, HIGH SENSITIVITY - Abnormal; Notable for the following components:   Troponin T High Sensitivity 24 (*)    All other components within normal limits  RESP PANEL BY RT-PCR (RSV, FLU A&B, COVID)  RVPGX2  LIPASE, BLOOD     I ordered and reviewed the above labs they are notable for troponin stable at 24 compared to 23 obtained yesterday.  BNP elevated 1800.  EKG  ED ECG REPORT I, Ginnie Shams, the attending  physician, personally viewed and interpreted this ECG.   Date: 10/06/2024  EKG Time: 0104  Rate: 92  Rhythm: sinus  Axis: nl  Intervals:long pr  ST&T Change: no stemi    PROCEDURES:  Critical Care performed: No  Procedures   MEDICATIONS ORDERED IN ED: Medications - No data to display   IMPRESSION / MDM / ASSESSMENT AND PLAN / ED COURSE  I reviewed the triage vital signs and the nursing notes.                                Patient's presentation is most consistent with acute presentation with potential threat to life or bodily function.  Differential diagnosis includes, but is not limited to, ACS, PE, dissection, CHF exacerbation, COPD exacerbation, respiratory infection   The patient is on the cardiac monitor to evaluate for evidence of arrhythmia and/or significant heart rate changes.  MDM:    Comes back with ongoing symptoms of shortness of breath and chest pressure after evaluation and discharge earlier  tonight.  Considered ACS but with nonischemic EKG and follow-up troponin remains flat, doubtful.  Considered dissection but symptomatology does not quite match.  Considered PE but he has been fully compliant with his Eliquis  and was only off of it for 3 days.  Considered CHF exacerbation as his BNP is elevated but luckily he does not appear to be in respiratory distress has no significant rales on auscultation no significant pulmonary edema findings on chest x-ray.  No evidence of COPD or respiratory infection.  Unclear of what patient desires from his stay today.  I offered IV diuretics to help unload potential early CHF exacerbation, treatment in the ED and reevaluation versus full hospital admission was also offered, further diagnostics with CT angiogram to rule out PE given his 3 days off of Eliquis  was offered as well.  He declined all of these stating that he would rather follow-up with his cardiologist in the morning.  He expressed a full understanding of the rationale for further diagnostics and treatment options and has decision-making capacity.  He was discharged according to his wishes.     FINAL CLINICAL IMPRESSION(S) / ED DIAGNOSES   Final diagnoses:  Shortness of breath     Rx / DC Orders   ED Discharge Orders     None        Note:  This document was prepared using Dragon voice recognition software and may include unintentional dictation errors.    Cyrena Mylar, MD 10/06/24 [phone]

## 2024-10-06 NOTE — Discharge Instructions (Addendum)
 Call your primary doctor and cardiologist for an appointment as soon as possible, take all medications as prescribed  Thank you for choosing us  for your health care today!  Please see your primary doctor this week for a follow up appointment.   If you have any new, worsening, or unexpected symptoms call your doctor right away or come back to the emergency department for reevaluation.  It was my pleasure to care for you today.   Ginnie EDISON Cyrena, MD

## 2024-10-06 NOTE — ED Triage Notes (Addendum)
 Patient brought in by brother for chronic dyspnea with exertion and chronic intermittent L chest pain. Current episode began at 2000. Seen yesterday for same. AxOx4, ambulatory to triage.

## 2024-10-06 NOTE — ED Notes (Signed)
 Pt ambulatory from inside the lobby to outside and back inside and in no resp distress.

## 2024-10-06 NOTE — ED Notes (Signed)
 Pt reports low BP is not normal so lasix  was held per cyrena, MD; plan of care changed for CT angio, pt refused. Cyrena, MD agreed to 40 mg lasix  since pt's baseline blood pressure is on the lower side. This RN went back to administer new lasix  order, pt is refusing stating he wants to talk to the doctor and go home. Cyrena, MD made aware.

## 2024-10-06 NOTE — ED Notes (Signed)
Pt refused respiratory swab.

## 2024-10-06 NOTE — ED Notes (Signed)
 ED Provider at bedside.

## 2024-10-06 NOTE — ED Notes (Signed)
 Pt refused resp swab. When asked pt informed RN he wasn't here for that. EDP updated on pt refusal

## 2024-10-10 NOTE — Progress Notes (Signed)
 DIVISION OF PULMONARY AND CRITICAL CARE MEDICINE                              FOLLOW UP ENCOUNTER     Chief complaint: COPD with chronic dyspnea  History of Present Illness Ronald Horne is a 73 year old male who presents with shortness of breath and chest pain.  He experienced shortness of breath and tight chest pain on Sunday night, prompting a visit to the emergency room. These symptoms have occurred before but were more persistent this time. During the ER visit, x-rays and an EKG did not reveal any abnormalities. A CT scan was considered but not performed.  He suspects that his symptoms might be related to his heart medications, particularly metoprolol , which he believes may cause breathing issues. He recalls being informed of low blood pressure during the ER visit.  He is currently taking several medications, including torsemide  20 mg, spironolactone , Farxiga  for the kidneys, and medications for the heart and thyroid. Torsemide  causes frequent urination, which he manages by taking it early in the morning to avoid nighttime disturbances.  He has a history of being athletic in his youth, participating in martial arts, basketball, and boxing, which helped maintain a lower weight. Currently, he weighs around 296 pounds and aims to reduce his weight to 250 pounds. He acknowledges that his waist size has increased significantly over the years.  He continues to take black seed, apple cider vinegar, green tea with honey and lemon, and turmeric.  No cough, but he reports experiencing palpitations and low blood pressure. He also mentions frequent urination due to his diuretic medication.   Past Medical History:   Past Medical History:  Diagnosis Date   Acute on chronic systolic CHF (congestive heart failure) (CMS/HHS-HCC) 07/30/2022   Last Assessment & Plan: Formatting of this note might be different from the original. Patient presents for evaluation of  worsening shortness of breath associated orthopnea and PND Was noted to be hypoxic upon arrival to the ER with room air pulse oximetry in the 60s and required BiPAP transiently. Chest x-ray shows findings consistent with CHF and BNP was elevated Last 2D echocardiogram from 2019    Diabetes (CMS/HHS-HCC)    Hepatitis    High cholesterol    History of sciatica    Hypertension    Thyroid activity decreased     Past Surgical History:  No past surgical history on file.  Allergies:   Allergies  Allergen Reactions   Albuterol  Other (See Comments)    Use with caution due to atrial fibrillation episodes    Current Medications:   Prior to Admission medications  Medication Sig Taking? Last Dose  AMIOdarone  (PACERONE ) 400 MG tablet Take 400 mg by mouth once daily 4 Tabs QD x7 Days 3 Tabs QD x7 Days 2 Tabs QD x7 Days 1 Tabs QD x7 Days Yes Taking  digoxin  (LANOXIN ) 0.25 MG tablet  Yes Taking  ELIQUIS  5 mg tablet Take 5 mg by mouth 2 (two) times daily Yes Taking  ENTRESTO  97-103 mg tablet Take 1 tablet by mouth every 12 (twelve) hours Yes Taking  famotidine (PEPCID) 40 MG tablet  Yes Taking  FARXIGA  10 mg tablet Take 10 mg by mouth once daily Yes Taking  fluticasone  propion-salmeteroL (ADVAIR DISKUS) 250-50 mcg/dose diskus inhaler Inhale 1 Puff into the lungs every 12 (twelve) hours Yes Taking  HYDROcodone-acetaminophen  (NORCO) 5-325 mg tablet Take one tablet at night for pain; may take up to every 8 hours as needed for pain if not working or driving Patient taking differently: Take 1 tablet by mouth every 6 (six) hours as needed for Pain Take one tablet at night for pain; may take up to every 4-6 hours as needed for pain if not working or driving Yes Taking  levalbuterol (XOPENEX HFA) inhaler Inhale 2 inhalations into the lungs every 4 (four) hours as needed for Wheezing or Shortness of Breath Yes Taking  methIMAzole  (TAPAZOLE ) 5 MG tablet Take 5 mg by mouth once daily Yes Taking   metoprolol  SUCCinate (TOPROL -XL) 100 MG XL tablet Take 100 mg by mouth once daily ALTERNATES Yes Taking  potassium chloride  (KLOR-CON  M10) 10 mEq ER tablet Take 10 mEq by mouth once daily Yes Taking  spironolactone  (ALDACTONE ) 25 MG tablet Take 12.5 mg by mouth once daily Yes Taking  UNABLE TO FIND Take 1 capsule by mouth 2 (two) times daily SEA MOSS Yes Taking  TORsemide  (DEMADEX ) 20 MG tablet Take 1 tablet (20 mg total) by mouth once daily for 90 days      Family History:   Family History  Problem Relation Name Age of Onset   No Known Problems Mother     No Known Problems Father      Social History:   Social History   Socioeconomic History   Marital status: Single  Tobacco Use   Smoking status: Never    Passive exposure: Never   Smokeless tobacco: Never  Vaping Use   Vaping status: Never Used  Substance and Sexual Activity   Alcohol use: No   Drug use: No   Sexual activity: Defer   Social Drivers of Health   Financial Resource Strain: Medium Risk (08/19/2024)   Overall Financial Resource Strain (CARDIA)    Difficulty of Paying Living Expenses: Somewhat hard  Food Insecurity: Food Insecurity Present (08/19/2024)   Hunger Vital Sign    Worried About Running Out of Food in the Last Year: Sometimes true    Ran Out of Food in the Last Year: Sometimes true  Transportation Needs: No Transportation Needs (08/19/2024)   PRAPARE - Administrator, Civil Service (Medical): No    Lack of Transportation (Non-Medical): No  Recent Concern: Transportation Needs - Unmet Transportation Needs (06/07/2024)   PRAPARE - Administrator, Civil Service (Medical): Yes    Lack of Transportation (Non-Medical): Yes  Housing Stability: High Risk (08/19/2024)   Housing Stability Vital Sign    Unable to Pay for Housing in the Last Year: Yes    Number of Times Moved in the Last Year: 0    Homeless in the Last Year: No    Review of Systems:   A 10  point review of systems is negative, except for the pertinent positives and negatives detailed in the HPI.  Vitals:   Vitals:   10/10/24 1052  BP: 100/72  Pulse: 80  SpO2: 97%  Weight: (!) 134.9 kg (297 lb 6.4 oz)  Height: 188 cm (6' 2.02)     Body mass index is 38.16 kg/m.  Physical Exam:   Physical Exam Vitals and nursing note reviewed.  Constitutional:      General: in no acute distress.    Appearance: Normal appearance. Is not ill-appearing, toxic-appearing or diaphoretic.  HENT:     Head: Normocephalic and atraumatic.     Right Ear: External ear normal.     Left Ear: External ear normal.  Eyes:     General:        Right eye: No discharge.        Left eye: No discharge.     Extraocular Movements: Extraocular movements intact.     Pupils: Pupils are equal, round, and reactive to light.  Cardiovascular:     Rate and Rhythm: Normal rate and regular rhythm.     Pulses: Normal pulses.     Heart sounds: Normal heart sounds. No murmur heard.    No friction rub. No gallop.  Abdominal:     General: Bowel sounds are normal.  Skin:    General: Skin is warm and dry.     Capillary Refill: Capillary refill takes less than 2 seconds.  Neurological:     Mental Status: Patient is alert.     Lab and Imaging Results:   Results RADIOLOGY Chest X-ray: Clear (10/05/2024)  DIAGNOSTIC ECG: Normal (10/05/2024)    Assessment and Plan:   Diagnoses and all orders for this visit:  Chronic obstructive pulmonary disease, unspecified COPD type (CMS/HHS-HCC)  OSA on CPAP  Class 2 severe obesity with serious comorbidity and body mass index (BMI) of 38.0 to 38.9 in adult, unspecified obesity type    Assessment & Plan Chronic obstructive pulmonary disease (COPD) COPD management discussed with emphasis on dietary habits and avoidance of harmful substances. - Encouraged dietary modifications including increased vegetable intake and avoidance of fried foods. - Advised against  smoking, drug use, and excessive alcohol consumption.  Obstructive sleep apnea (OSA) - Continue CPAP therapy with distilled water. - Consider using a nasal irrigation device like Navage for sinus cleansing.  Class 2 severe obesity with serious comorbidity Obesity management discussed with emphasis on weight loss and dietary habits. - Encouraged weight loss with a target of 250 lbs. - Advised on dietary modifications including increased vegetable intake and moderation of high-calorie foods.  Congestive heart failure (CHF) CHF management discussed with emphasis on diuretic use and medication side effects. - Continue torsemide  20 mg once daily in the morning. - Monitor for side effects of heart medications, including potential breathing issues and hair loss. - Consult cardiologist regarding medication adjustments if necessary.    I spent 41 minutes in both face-to-face and non-face-to-face activities including reviewing history, performing an exam and evaluation, entering clinical information EHR, interpreting results, counseling patient/family/caregiver, reviewing x-rays/CT scans/MRI/labs/echocardiography/PFTs, ordering meds/tests/procedures, referring and communicating with consulting healthcare professionals and care coordination. This does not include time spent with staff.    The patient and/or family voices understanding of the plans. All questions and concerns were answered. The patient and /or family was instructed to call if the patient needs to be seen sooner. Thank you for allowing me to participate in the care of this patient. Do not hesitate to contact me via Epic or by calling our office at [phone] with any questions or concerns.     This note has been created using dictation software tool and any typographical errors are purely unintentional.  Patient received an After Visit Summary

## 2024-10-14 ENCOUNTER — Ambulatory Visit: Attending: Nurse Practitioner | Admitting: Nurse Practitioner

## 2024-10-14 ENCOUNTER — Encounter: Payer: Self-pay | Admitting: Nurse Practitioner

## 2024-10-14 VITALS — BP 107/67 | HR 86 | Temp 97.5°F | Resp 20 | Ht 74.0 in | Wt 300.0 lb

## 2024-10-14 DIAGNOSIS — M47812 Spondylosis without myelopathy or radiculopathy, cervical region: Secondary | ICD-10-CM | POA: Diagnosis not present

## 2024-10-14 DIAGNOSIS — Z9229 Personal history of other drug therapy: Secondary | ICD-10-CM | POA: Insufficient documentation

## 2024-10-14 DIAGNOSIS — M5412 Radiculopathy, cervical region: Secondary | ICD-10-CM

## 2024-10-14 DIAGNOSIS — M47816 Spondylosis without myelopathy or radiculopathy, lumbar region: Secondary | ICD-10-CM | POA: Diagnosis present

## 2024-10-14 DIAGNOSIS — G894 Chronic pain syndrome: Secondary | ICD-10-CM | POA: Insufficient documentation

## 2024-10-14 DIAGNOSIS — M12811 Other specific arthropathies, not elsewhere classified, right shoulder: Secondary | ICD-10-CM

## 2024-10-14 DIAGNOSIS — M75101 Unspecified rotator cuff tear or rupture of right shoulder, not specified as traumatic: Secondary | ICD-10-CM | POA: Diagnosis not present

## 2024-10-14 NOTE — Patient Instructions (Signed)
 ______________________________________________________________________    Procedure instructions  Stop blood-thinners  Do not eat or drink fluids (other than water ) for 6 hours before your procedure  No water  for 2 hours before your procedure  Take your blood pressure medicine with a sip of water   Arrive 30 minutes before your appointment  If sedation is planned, bring suitable driver. [place], [place], & public transportation are NOT APPROVED)  Carefully read the Preparing for your procedure detailed instructions  If you have questions call us  at [phone]  Procedure appointments are for procedures only.   NO medication refills or new problem evaluations will be done on procedure days.   Only the scheduled, pre-approved procedure and side will be done.   ______________________________________________________________________     ______________________________________________________________________    Preparing for your procedure  Appointments: If you think you may not be able to keep your appointment, call 24-48 hours in advance to cancel. We need time to make it available to others.  Procedure visits are for procedures only. During your procedure appointment there will be: NO Prescription Refills*. NO medication changes or discussions*. NO discussion of disability issues*. NO unrelated pain problem evaluations*. NO evaluations to order other pain procedures*. *These will be addressed at a separate and distinct evaluation encounter on the provider's evaluation schedule and not during procedure days.  Instructions: Food intake: Avoid eating anything solid for at least 8 hours prior to your procedure. Clear liquid intake: You may take clear liquids such as water  up to 2 hours prior to your procedure. (No carbonated drinks. No soda.) Transportation: Unless otherwise stated by your physician, bring a driver. (Driver cannot be a Market researcher, Pharmacist, community, or any other form of public  transportation.) Morning Medicines: Except for blood thinners, take all of your other morning medications with a sip of water . Make sure to take your heart and blood pressure medicines. If your blood pressure's lower number is above 100, the case will be rescheduled. Blood thinners: Make sure to stop your blood thinners as instructed.  If you take a blood thinner, but were not instructed to stop it, call our office [phone] and ask to talk to a nurse. Not stopping a blood thinner prior to certain procedures could lead to serious complications. Diabetics on insulin: Notify the staff so that you can be scheduled 1st case in the morning. If your diabetes requires high dose insulin, take only  of your normal insulin dose the morning of the procedure and notify the staff that you have done so. Preventing infections: Shower with an antibacterial soap the morning of your procedure.  Build-up your immune system: Take 1000 mg of Vitamin C with every meal (3 times a day) the day prior to your procedure. Antibiotics: Inform the nursing staff if you are taking any antibiotics or if you have any conditions that may require antibiotics prior to procedures. (Example: recent joint implants)   Pregnancy: If you are pregnant make sure to notify the nursing staff. Not doing so may result in injury to the fetus, including death.  Sickness: If you have a cold, fever, or any active infections, call and cancel or reschedule your procedure. Receiving steroids while having an infection may result in complications. Arrival: You must be in the facility at least 30 minutes prior to your scheduled procedure. Tardiness: Your scheduled time is also the cutoff time. If you do not arrive at least 15 minutes prior to your procedure, you will be rescheduled.  Children: Do not bring any children with  you. Make arrangements to keep them home. Dress appropriately: There is always a possibility that your clothing may get soiled. Avoid  long dresses. Valuables: Do not bring any jewelry or valuables.  Reasons to call and reschedule or cancel your procedure: (Following these recommendations will minimize the risk of a serious complication.) Surgeries: Avoid having procedures within 2 weeks of any surgery. (Avoid for 2 weeks before or after any surgery). Flu Shots: Avoid having procedures within 2 weeks of a flu shots or . (Avoid for 2 weeks before or after immunizations). Barium: Avoid having a procedure within 7-10 days after having had a radiological study involving the use of radiological contrast. (Myelograms, Barium swallow or enema study). Heart attacks: Avoid any elective procedures or surgeries for the initial 6 months after a Myocardial Infarction (Heart Attack). Blood thinners: It is imperative that you stop these medications before procedures. Let us  know if you if you take any blood thinner.  Infection: Avoid procedures during or within two weeks of an infection (including chest colds or gastrointestinal problems). Symptoms associated with infections include: Localized redness, fever, chills, night sweats or profuse sweating, burning sensation when voiding, cough, congestion, stuffiness, runny nose, sore throat, diarrhea, nausea, vomiting, cold or Flu symptoms, recent or current infections. It is specially important if the infection is over the area that we intend to treat. Heart and lung problems: Symptoms that may suggest an active cardiopulmonary problem include: cough, chest pain, breathing difficulties or shortness of breath, dizziness, ankle swelling, uncontrolled high or unusually low blood pressure, and/or palpitations. If you are experiencing any of these symptoms, cancel your procedure and contact your primary care physician for an evaluation.  Remember:  Regular Business hours are:  Monday to Thursday 8:00 AM to 4:00 PM  Provider's Schedule: Eric Como, MD:  Procedure days: Tuesday and Thursday 7:30  AM to 4:00 PM  Wallie Sherry, MD:  Procedure days: Monday and Wednesday 7:30 AM to 4:00 PM Last  Updated: 10/10/2023 ______________________________________________________________________     ______________________________________________________________________    Blood Thinners  IMPORTANT NOTICE:  If you take any of these, make sure to notify the nursing staff.  Failure to do so may result in serious injury.  Recommended time intervals to stop and restart blood-thinners, before & after invasive procedures  Generic Name Brand Name Pre-procedure: Stop medication for this amount of time before your procedure: Post-procedure: Wait this amount of time after the procedure before restarting your medication:  Abciximab Reopro 15 days 2 hrs  Alteplase Activase 10 days 10 days  Anagrelide Agrylin    Apixaban Eliquis 3 days 6 hrs  Cilostazol Pletal 3 days 5 hrs  Clopidogrel Plavix 7-10 days 2 hrs  Dabigatran Pradaxa 5 days 6 hrs  Dalteparin Fragmin 24 hours 4 hrs  Dipyridamole Aggrenox 11days 2 hrs  Edoxaban Lixiana; Savaysa 3 days 2 hrs  Enoxaparin  Lovenox 24 hours 4 hrs  Eptifibatide Integrillin 8 hours 2 hrs  Fondaparinux  Arixtra 72 hours 12 hrs  Hydroxychloroquine Plaquenil 11 days   Prasugrel Effient 7-10 days 6 hrs  Reteplase Retavase 10 days 10 days  Rivaroxaban Xarelto 3 days 6 hrs  Ticagrelor Brilinta 5-7 days 6 hrs  Ticlopidine Ticlid 10-14 days 2 hrs  Tinzaparin Innohep 24 hours 4 hrs  Tirofiban Aggrastat 8 hours 2 hrs  Warfarin Coumadin 5 days 2 hrs   Other medications with blood-thinning effects  NOTE: Consider stopping these if you have prolonged bleeding despite not taking any of the above blood thinners. Otherwise  ask your provider and this will be decided on a case-by-case basis.  Product indications Generic (Brand) names Note  Cholesterol Lipitor Stop 4 days before procedure  Blood thinner (injectable) Heparin (LMW or LMWH Heparin) Stop 24 hours before  procedure  Cancer Ibrutinib (Imbruvica) Stop 7 days before procedure  Malaria/Rheumatoid Hydroxychloroquine (Plaquenil) Stop 11 days before procedure  Thrombolytics  10 days before or after procedures   Over-the-counter (OTC) Products with blood-thinning effects  NOTE: Consider stopping these if you have prolonged bleeding despite not taking any of the above blood thinners. Otherwise ask your provider and this will be decided on a case-by-case basis.  Product Common names Stop Time  Aspirin > 325 mg Goody Powders, Excedrin, etc. 11 days  Aspirin <= 81 mg  7 days  Fish oil  4 days  Garlic supplements  7 days  Ginkgo biloba  36 hours  Ginseng  24 hours  NSAIDs Ibuprofen, Naprosyn, etc. 3 days  Vitamin E  4 days   ______________________________________________________________________

## 2024-10-14 NOTE — Progress Notes (Signed)
 PROVIDER NOTE: Interpretation of information contained herein should be left to medically-trained personnel. Specific patient instructions are provided elsewhere under Patient Instructions section of medical record. This document was created in part using AI and STT-dictation technology, any transcriptional errors that may result from this process are unintentional.  Patient: Ronald Horne  Service: E/M   PCP: [name], NP  DOB: [birth year]  DOS: 10/14/2024  Provider: Emmy MARLA Blanch, NP  MRN: [id number]  Delivery: Face-to-face  Specialty: Interventional Pain Management  Type: Established Patient  Setting: Ambulatory outpatient facility  Specialty designation: 09  Referring Prov.: [name], NP  Location: Outpatient office facility       History of present illness (HPI) Ronald Horne, a 73 year old year old male, is here today because of his Lumbar facet arthropathy [M47.816]. Ronald Horne primary complain today is Back Pain and Neck Pain  Pertinent problems: [name] has CHF exacerbation (HCC); Acute HFrEF (heart failure with reduced ejection fraction) (HCC); Obesity; GERD (gastroesophageal reflux disease); Unstable angina (HCC); Lumbar facet arthropathy; Cervical spondylosis; Cervical radicular pain; Chronic pain syndrome; and Right rotator cuff tear arthropathy on their pertinent problem list.  Pain Assessment: Severity of Chronic pain is reported as a 8 /10. Location: Back Lower/From neck, to back to bilateral hips. Onset: More than a month ago. Quality: Aching, Tightness. Timing: Constant. Modifying factor(s): Pain medication. Vitals:  height is 6' 2 (1.88 m) and weight is 300 lb (136.1 kg). His temporal temperature is 97.5 F (36.4 C) (abnormal). His blood pressure is 107/67 and his pulse is 86. His respiration is 20 and oxygen saturation is 97%.  BMI: Estimated body mass index is 38.52 kg/m as calculated from the following:   Height as of this encounter: 6' 2 (1.88 m).    Weight as of this encounter: 300 lb (136.1 kg).  Last encounter: Visit date not found. Last procedure: 09/16/2024  Reason for encounter: evaluation for possible interventional PM therapy/treatment and close procedure evaluation and assessment.   Discussed the use of AI scribe software for clinical note transcription with the patient, who gave verbal consent to proceed.  History of Present Illness   Ronald Horne is a 73 year old male with degenerative joint disease and COPD who presents with persistent lower back pain.  He has persistent lower back pain, previously managed with lumbar facet blocks that provided good relief for several days, with the patient estimating 70-80% pain reduction during that period. However, the pain returned after four to five days. He has undergone two lumbar facet blocks, both offering similar short-term relief.  He has a history of arthritis of the spine, specifically at the L5 level, and degenerative disc disease. He experiences stiffness in the shoulder and lower back, more pronounced in the morning, taking about ten minutes to loosen up. He engages in physical therapy, including isometric exercises and light weight lifting, three times a week to manage his shoulder stiffness.  He has COPD and uses a nebulizer with albuterol , two inhalers (one every twelve hours and another twice a day), and a CPAP machine for sleep apnea. He also has a heart condition diagnosed about two years ago, for which he sees a cardiologist regularly. He has undergone an echocardiogram and had a procedure to check for arterial blockages. He occasionally experiences chest tightness, which resolves quickly, and has been evaluated in the emergency room with EKG and blood work, which were normal. He takes Lasix  and potassium supplements as part of his treatment regimen.  Procedure Type: Lumbar Facet, Medial Branch Block(s) (w/ fluoroscopic mapping) #2  Laterality: Bilateral  Level: L3,  L4, and L5 Medial Branch/Dorsal Rami Level(s). Injecting these levels blocks the L3-4 and L4-5 lumbar facet joints. Imaging: Fluoroscopic guidance Spinal ([id number]) Anesthesia: Local anesthesia (1-2% Lidocaine ) Sedation: PO Valium  DOS: 09/16/2024 Performed by: Wallie Sherry, MD   Patient stopped Eliquis  3 days prior   Primary Purpose: Diagnostic/Therapeutic Indications: Low back pain severe enough to impact quality of life or function. 1. Lumbar facet arthropathy   2. Lumbar spondylosis   3. Chronic pain syndrome     NAS-11 Pain score:        Pre-procedure: 9 /10        Post-procedure: 4 /10   Post-Procedure Evaluation   Effectiveness:  Initial hour after procedure: 100 % . Subsequent 4-6 hours post-procedure: 100 % . Analgesia past initial 6 hours: 50 % (Pain returned about 4-5 days after Procedure.) . Ongoing improvement:  Analgesic:  50% Function: Somewhat improved ROM: Somewhat improved Interpretation: Ronald Horne underwent a diagnostic/therapeutic lumbar facet block on September 16, 2024.  He reports initially 100% pain relief and functional improvement during local anesthetic phase, followed by sustained ongoing 50% pain relief for approximately 4 to 5 days, then pain returned to baseline.   Procedure Procedure: Suprascapular nerve block (SSNB) #1  Laterality:  Right  Level: Superior to scapular spine, lateral to supraspinatus fossa (Suprascapular notch).  Imaging: Fluoroscopic guidance         Anesthesia: Local anesthesia (1-2% Lidocaine ) Anxiolysis:PO Valium  DOS: 09/16/2024  Performed by: Wallie Sherry, MD   Purpose: Diagnostic/Therapeutic Indications: Shoulder pain, severe enough to impact quality of life and/or function. 1. Lumbar facet arthropathy   2. Lumbar spondylosis   3. Chronic pain syndrome   4. Right rotator cuff tear arthropathy     NAS-11 score:         Pre-procedure: 9 /10         Post-procedure: 4 /10   Post-Procedure Evaluation    Effectiveness:  Initial hour after procedure: 100 % . Subsequent 4-6 hours post-procedure: 100% . Analgesia past initial 6 hours: 70% Ongoing improvement:  Analgesic:  70% Function: Ronald Horne reports improvement in function ROM: Ronald Horne reports improvement in ROM Interpretation:  Ronald Horne underwent a diagnostic/therapeutic to parascapular nerve block on September 16, 2024.  He reports initially 100% pain relief and functional improvement during local anesthetic phase, followed by sustained ongoing 70% pain relief since the procedure.  Pharmacotherapy Assessment   Monitoring: [place] PMP: PDMP not reviewed this encounter.       Pharmacotherapy: No side-effects or adverse reactions reported. Compliance: No problems identified. Effectiveness: Clinically acceptable.  Erlene Doyal [place], [place]  10/14/2024  9:49 AM  Sign when Signing Visit Safety precautions to be maintained throughout the outpatient stay will include: orient to surroundings, keep bed in low position, maintain call bell within reach at all times, provide assistance with transfer out of bed and ambulation.     UDS:  Summary  Date Value Ref Range Status  07/23/2024 FINAL  Final    Comment:    ==================================================================== Compliance Drug Analysis, Ur ==================================================================== Test                             Result       Flag       Units  Drug Present and Declared for Prescription Verification   Hydrocodone  90           EXPECTED   ng/mg creat   Hydromorphone                  54           EXPECTED   ng/mg creat   Dihydrocodeine                 51           EXPECTED   ng/mg creat   Norhydrocodone                 91           EXPECTED   ng/mg creat    Sources of hydrocodone include scheduled prescription medications.    Hydromorphone, dihydrocodeine and norhydrocodone are expected    metabolites of hydrocodone.  Hydromorphone and dihydrocodeine are    also available as scheduled prescription medications.    Acetaminophen                   PRESENT      EXPECTED  Drug Absent but Declared for Prescription Verification   Metoprolol                      Not Detected UNEXPECTED ==================================================================== Test                      Result    Flag   Units      Ref Range   Creatinine              144              mg/dL      >=79 ==================================================================== Declared Medications:  The flagging and interpretation on this report are based on the  following declared medications.  Unexpected results may arise from  inaccuracies in the declared medications.   **Note: The testing scope of this panel includes these medications:   Hydrocodone  Metoprolol  (Toprol )   **Note: The testing scope of this panel does not include small to  moderate amounts of these reported medications:   Acetaminophen    **Note: The testing scope of this panel does not include the  following reported medications:   Albuterol  (Ventolin  HFA)  Amiodarone  (Pacerone )  Apixaban  (Eliquis )  Atorvastatin  (Lipitor)  Dapagliflozin  (Farxiga )  Digoxin  (Lanoxin )  Famotidine (Pepcid)  Methimazole  (Tapazole )  Multivitamin  Omeprazole (Prilosec)  Sacubitril  (Entresto )  Spironolactone  (Aldactone )  Supplement  Torsemide  (Demadex )  Valsartan  (Entresto ) ==================================================================== For clinical consultation, please call [phone]. ====================================================================     No results found for: CBDTHCR No results found for: D8THCCBX No results found for: D9THCCBX  ROS  Constitutional: Denies any fever or chills Gastrointestinal: No reported hemesis, hematochezia, vomiting, or acute GI distress Musculoskeletal: Low back pain (lumbar facet loading) Neurological: No reported  episodes of acute onset apraxia, aphasia, dysarthria, agnosia, amnesia, paralysis, loss of coordination, or loss of consciousness  Medication Review  Apple Cider Vinegar, Ensifentrine , HYDROcodone-acetaminophen , albuterol , amiodarone , apixaban , atorvastatin , dapagliflozin  propanediol, digoxin , famotidine, fluticasone-salmeterol, furosemide , levalbuterol, methimazole , metoprolol , multivitamin with minerals, omeprazole, potassium chloride , sacubitril -valsartan , spironolactone , and torsemide   History Review  Allergy: Mr. Federici is allergic to albuterol . Drug: Mr. Istre  reports no history of drug use. Alcohol:  reports no history of alcohol use. Tobacco:  reports that he quit smoking about 47 years ago. His smoking use included cigarettes. He started smoking about 48 years ago. He has a 3 pack-year smoking history. He  has never used smokeless tobacco. Social: Mr. Verno  reports that he quit smoking about 47 years ago. His smoking use included cigarettes. He started smoking about 48 years ago. He has a 3 pack-year smoking history. He has never used smokeless tobacco. He reports that he does not drink alcohol and does not use drugs. Medical:  has a past medical history of Arthritis, Cardiomyopathy (HCC), Chronic back pain, Chronic combined systolic (congestive) and diastolic (congestive) heart failure (HCC), COPD (chronic obstructive pulmonary disease) (HCC), GERD (gastroesophageal reflux disease), Gout, Hypertension, Obesity, and Sleep apnea. Surgical: Mr. Steen  has a past surgical history that includes Tonsillectomy; Epidural block injection; and LEFT HEART CATH AND CORONARY ANGIOGRAPHY (Left, 12/27/2019). Family: family history is not on file.  Laboratory Chemistry Profile   Renal Lab Results  Component Value Date   BUN 9 10/05/2024   CREATININE 1.11 10/05/2024   BCR 13 02/09/2024   GFRAA >60 12/27/2019   GFRNONAA >60 10/05/2024    Hepatic Lab Results  Component Value Date   AST 20  04/10/2024   ALT 19 04/10/2024   ALBUMIN 3.2 (L) 04/10/2024   ALKPHOS 51 04/10/2024   LIPASE 20 10/06/2024    Electrolytes Lab Results  Component Value Date   NA 136 10/05/2024   K 4.1 10/05/2024   CL 101 10/05/2024   CALCIUM  8.7 (L) 10/05/2024   MG 1.8 05/23/2024    Bone No results found for: VD25OH, CI874NY7UNU, CI6874NY7, CI7874NY7, 25OHVITD1, 25OHVITD2, 25OHVITD3, TESTOFREE, TESTOSTERONE  Inflammation (CRP: Acute Phase) (ESR: Chronic Phase) Lab Results  Component Value Date   LATICACIDVEN 2.1 (HH) 05/19/2023         Note: Above Lab results reviewed.  Recent Imaging Review  DG Chest 2 View EXAM: 2 VIEW(S) XRAY OF THE CHEST 10/05/2024 08:59:43 PM  COMPARISON: 05/22/2024  CLINICAL HISTORY: chest pain  FINDINGS:  LUNGS AND PLEURA: No focal pulmonary opacity. No pleural effusion. No pneumothorax.  HEART AND MEDIASTINUM: Heart mildly enlarged.  BONES AND SOFT TISSUES: No acute osseous abnormality.  IMPRESSION: 1. No acute cardiopulmonary process identified. 2. Mild cardiomegaly.  Electronically signed by: Franky Crease MD 10/05/2024 09:01 PM EST RP Workstation: [id number] Note: Reviewed        Physical Exam  Vitals: BP 107/67 (BP Location: Right Arm, Patient Position: Sitting, Cuff Size: Normal)   Pulse 86   Temp (!) 97.5 F (36.4 C) (Temporal)   Resp 20   Ht 6' 2 (1.88 m)   Wt 300 lb (136.1 kg)   SpO2 97%   BMI 38.52 kg/m  BMI: Estimated body mass index is 38.52 kg/m as calculated from the following:   Height as of this encounter: 6' 2 (1.88 m).   Weight as of this encounter: 300 lb (136.1 kg). Ideal: Ideal body weight: 82.2 kg (181 lb 3.5 oz) Adjusted ideal body weight: 103.8 kg (228 lb 11.7 oz) General appearance: Well nourished, well developed, and well hydrated. In no apparent acute distress Mental status: Alert, oriented x 3 (person, place, & time)       Respiratory: No evidence of acute respiratory distress Eyes:  PERLA  Musculoskeletal: +LBP (facet mediated pain) Lumbar Spine Area Exam  Skin & Axial Inspection: No masses, redness, or swelling Alignment: Symmetrical Functional ROM: Pain restricted ROM affecting both sides Stability: No instability detected Muscle Tone/Strength: Functionally intact. No obvious neuro-muscular anomalies detected. Sensory (Neurological): Musculoskeletal pain pattern Palpation: No palpable anomalies       Provocative Tests: Hyperextension/rotation test: (+) bilaterally for facet joint pain.  Lumbar quadrant test (Kemp's test): (+) bilaterally for facet joint pain.   Gait & Posture Assessment  Ambulation: Unassisted Gait: Relatively normal for age and body habitus Posture: WNL  Lower Extremity Exam      Side: Right lower extremity   Side: Left lower extremity  Stability: No instability observed           Stability: No instability observed          Skin & Extremity Inspection: Skin color, temperature, and hair growth are WNL. No peripheral edema or cyanosis. No masses, redness, swelling, asymmetry, or associated skin lesions. No contractures.   Skin & Extremity Inspection: Skin color, temperature, and hair growth are WNL. No peripheral edema or cyanosis. No masses, redness, swelling, asymmetry, or associated skin lesions. No contractures.  Functional ROM: Unrestricted ROM                   Functional ROM: Unrestricted ROM                  Muscle Tone/Strength: Functionally intact. No obvious neuro-muscular anomalies detected.   Muscle Tone/Strength: Functionally intact. No obvious neuro-muscular anomalies detected.  Sensory (Neurological): Unimpaired         Sensory (Neurological): Unimpaired        DTR: Patellar: deferred today Achilles: deferred today Plantar: deferred today   DTR: Patellar: deferred today Achilles: deferred today Plantar: deferred today  Palpation: No palpable anomalies   Palpation: No palpable anomalies   Assessment   Diagnosis Status  1.  Lumbar facet arthropathy   2. Lumbar spondylosis   3. Chronic pain syndrome   4. Right rotator cuff tear arthropathy   5. Cervical radicular pain   6. Cervical facet joint syndrome   7. Cervical spondylosis   8. History of anticoagulant use    Persistent Persistent Controlled   Updated Problems: Problem  History of Anticoagulant Use    Plan of Care  Problem-specific:  Assessment and Plan    Lumbar spondylosis with facet arthropathy Chronic lumbar spondylosis with facet arthropathy causing significant pain. Previous lumbar facet block provided 70-80% relief for 4-5 days, insufficient for radiofrequency ablation criteria. Radiofrequency ablation considered as next step for pain management. - Scheduled lumbar radiofrequency ablation in three weeks. - Coordinate with Joyas to determine if suprascapular nerve block can be performed on the same day as lumbar radiofrequency ablation.  Right rotator cuff tear arthropathy Chronic right rotator cuff tear arthropathy with persistent stiffness and pain. Previous suprascapular nerve block provided good relief for the first two days, but pain returned afterwards. Physical therapy ongoing with isometric exercises and light weight lifting. - Continue physical therapy three times a week. - Coordinate with Joyas to determine if suprascapular nerve block can be performed on the same day as lumbar radiofrequency ablation.   Stop Eliquis  3 days prior to the procedure  Plan: (Clinic): (B/L) L-RFA # 1 (PO valium ) with Dr. Marcelino , (Blood thinner protocol)        Ronald Horne has a current medication list which includes the following long-term medication(s): albuterol , amiodarone , atorvastatin , digoxin , eliquis , famotidine, fluticasone-salmeterol, furosemide , levalbuterol, metoprolol , omeprazole, spironolactone , and torsemide .  Pharmacotherapy (Medications Ordered): No orders of the defined types were placed in this encounter.  Orders:   Orders Placed This Encounter  Procedures   Radiofrequency,Lumbar    Standing Status:   Future    Expiration Date:   01/12/2025    Scheduling Instructions:     Side(s): Bilateral  Level: L3-4 and L4-5 Facets (L3, L4, and L5 Medial Branch)     Sedation: With Sedation.     Timeframe: As soon as schedule allows.    Where will this procedure be performed?:   [facility]   Blood Thinner Instructions to Nursing    Always make sure patient has clearance from prescribing physician to stop blood thinners for interventional therapies. If the patient requires a Lovenox -bridge therapy, make sure arrangements are made to institute it with the assistance of the PCP.    Scheduling Instructions:     Have Mr. Hyppolite stop the Eliquis  (Apixaban ) x 3 days, prior to procedure or surgery.        Return in about 3 weeks (around 11/04/2024) for [facility]): (B/L) L-RFA # 1 (PO valium ) with Dr. Marcelino , (Blood thinner protocol).    Recent Visits Date Type Provider Dept  09/16/24 Procedure visit Marcelino Nurse, MD [facility]  09/05/24 Office Visit Marcelino Nurse, MD [facility]  08/12/24 Procedure visit Marcelino Nurse, MD [facility]  07/23/24 Office Visit Marcelino Nurse, MD [facility]  Showing recent visits within past 90 days and meeting all other requirements Today's Visits Date Type Provider Dept  10/14/24 Office Visit Humbert Morozov K, NP [facility]  Showing today's visits and meeting all other requirements Future Appointments No visits were found meeting these conditions. Showing future appointments within next 90 days and meeting all other requirements  I discussed the assessment and treatment plan with the patient. The patient was provided an opportunity to ask questions and all were answered. The patient agreed with the plan and demonstrated an understanding of the instructions.  Patient advised to call back or seek an in-person evaluation  if the symptoms or condition worsens.  I personally spent a total of 20 minutes in the care of the patient today including preparing to see the patient, getting/reviewing separately obtained history, performing a medically appropriate exam/evaluation, counseling and educating, placing orders, referring and communicating with other health care professionals, documenting clinical information in the EHR, independently interpreting results, communicating results, and coordinating care.   Note by: Farida Mcreynolds K Guiliana Shor, NP (TTS and AI technology used. I apologize for any typographical errors that were not detected and corrected.) Date: 10/14/2024; Time: 11:13 AM

## 2024-10-14 NOTE — Progress Notes (Signed)
 Safety precautions to be maintained throughout the outpatient stay will include: orient to surroundings, keep bed in low position, maintain call bell within reach at all times, provide assistance with transfer out of bed and ambulation.

## 2024-10-16 ENCOUNTER — Other Ambulatory Visit: Payer: Self-pay | Admitting: Cardiovascular Disease

## 2024-10-16 DIAGNOSIS — R0789 Other chest pain: Secondary | ICD-10-CM

## 2024-10-16 DIAGNOSIS — I480 Paroxysmal atrial fibrillation: Secondary | ICD-10-CM

## 2024-10-16 DIAGNOSIS — I519 Heart disease, unspecified: Secondary | ICD-10-CM

## 2024-10-16 DIAGNOSIS — I739 Peripheral vascular disease, unspecified: Secondary | ICD-10-CM

## 2024-10-28 ENCOUNTER — Telehealth: Payer: Self-pay | Admitting: *Deleted

## 2024-10-28 NOTE — Telephone Encounter (Signed)
 We received a letter from his PCP asking for our office to  manage his long term pain medication.  Dr. Marcelino agreed to do so.  I attempted to call pt to inform him of this and ask him when he runs out. Message left.

## 2024-11-01 ENCOUNTER — Encounter: Payer: Self-pay | Admitting: Cardiovascular Disease

## 2024-11-01 ENCOUNTER — Ambulatory Visit (INDEPENDENT_AMBULATORY_CARE_PROVIDER_SITE_OTHER): Admitting: Cardiovascular Disease

## 2024-11-01 VITALS — BP 108/66 | HR 92 | Ht 74.0 in | Wt 302.8 lb

## 2024-11-01 DIAGNOSIS — I10 Essential (primary) hypertension: Secondary | ICD-10-CM

## 2024-11-01 DIAGNOSIS — I739 Peripheral vascular disease, unspecified: Secondary | ICD-10-CM | POA: Diagnosis not present

## 2024-11-01 DIAGNOSIS — I249 Acute ischemic heart disease, unspecified: Secondary | ICD-10-CM

## 2024-11-01 DIAGNOSIS — I5021 Acute systolic (congestive) heart failure: Secondary | ICD-10-CM | POA: Diagnosis not present

## 2024-11-01 DIAGNOSIS — I4891 Unspecified atrial fibrillation: Secondary | ICD-10-CM | POA: Diagnosis not present

## 2024-11-01 DIAGNOSIS — R0602 Shortness of breath: Secondary | ICD-10-CM

## 2024-11-01 DIAGNOSIS — I519 Heart disease, unspecified: Secondary | ICD-10-CM

## 2024-11-01 NOTE — Progress Notes (Signed)
 "     Cardiology Office Note   Date:  11/01/2024   ID:  Panfilo Ketchum, DOB October 08, 1951, MRN [id number]  PCP:  [name], NP  Cardiologist:  Denyse Bathe, MD      History of Present Illness: Ronald Horne is a 74 y.o. male who presents for  Chief Complaint  Patient presents with   Follow-up    2 month follow up    Has constipated. No SOB, or chest pain.      Past Medical History:  Diagnosis Date   Arthritis    Cardiomyopathy (HCC)    a. 08/2016 Echo: EF 30-35%, diff HK; b. 03/2018 Ex MV ([facility]): Ex time 2:00, large anter defect, EF 24%; c. 03/2018 Echo: EF 20-25%, diff HK. Gr1 DD; d. Refused cath.   Chronic back pain    Chronic combined systolic (congestive) and diastolic (congestive) heart failure (HCC)    a. 08/2016 Echo: EF 30-35%, diff HK, mild to mod MR, mild BAE; b. 03/2018 Echo: EF 20-25%, diff HK. Gr1 DD. MIldly dil Ao root. Mild BAE. Mildly reduced RV fxn.   COPD (chronic obstructive pulmonary disease) (HCC)    GERD (gastroesophageal reflux disease)    Gout    Hypertension    Obesity    Sleep apnea      Past Surgical History:  Procedure Laterality Date   EPIDURAL BLOCK INJECTION     chronic back pain    LEFT HEART CATH AND CORONARY ANGIOGRAPHY Left 12/27/2019   Procedure: LEFT HEART CATH AND CORONARY ANGIOGRAPHY;  Surgeon: Bathe Denyse LABOR, MD;  Location: [facility];  Service: Cardiovascular;  Laterality: Left;   TONSILLECTOMY       Current Outpatient Medications  Medication Sig Dispense Refill   albuterol  (VENTOLIN  HFA) 108 (90 Base) MCG/ACT inhaler Inhale 1-2 puffs into the lungs every 6 (six) hours as needed for shortness of breath or wheezing.     amiodarone  (PACERONE ) 200 MG tablet TAKE 1 TABLET (200 MG TOTAL) BY MOUTH DAILY. 30 tablet 2   APPLE CIDER VINEGAR PO Take 15 mLs by mouth in the morning and at bedtime.     atorvastatin  (LIPITOR) 20 MG tablet Take 20 mg by mouth in the morning.     digoxin  (LANOXIN ) 0.25 MG tablet Take 1 tablet  (250 mcg total) by mouth daily. 30 tablet 11   ELIQUIS  5 MG TABS tablet TAKE 1 TABLET TWICE A DAY 60 tablet 0   famotidine (PEPCID) 40 MG tablet Take 40 mg by mouth. (Patient taking differently: Take 40 mg by mouth as needed.)     FARXIGA  10 MG TABS tablet TAKE 1 TABLET DAILY 30 tablet 6   fluticasone-salmeterol (ADVAIR) 250-50 MCG/ACT AEPB Inhale 1 puff into the lungs 2 (two) times daily.     HYDROcodone-acetaminophen  (NORCO/VICODIN) 5-325 MG tablet Take 1 tablet by mouth every 4 (four) hours as needed.     levalbuterol (XOPENEX HFA) 45 MCG/ACT inhaler Inhale into the lungs.     methimazole  (TAPAZOLE ) 5 MG tablet Take 5 mg by mouth in the morning.     metoprolol  (TOPROL  XL) 200 MG 24 hr tablet Take 1 tablet (200 mg total) by mouth daily. 30 tablet 11   Multiple Vitamin (MULTIVITAMIN WITH MINERALS) TABS tablet Take 1 tablet by mouth daily.     OHTUVAYRE  3 MG/2.5ML SUSP      potassium chloride  (KLOR-CON ) 10 MEQ tablet Take 10 mEq by mouth daily.     sacubitril -valsartan  (ENTRESTO ) 97-103 MG TAKE  1 TABLET BY MOUTH TWICE A DAY 60 tablet 6   spironolactone  (ALDACTONE ) 25 MG tablet TAKE 1/2 TABLET BY MOUTH DAILY 15 tablet 6   torsemide  (DEMADEX ) 10 MG tablet Take 1 tablet (10 mg total) by mouth 2 (two) times daily. 60 tablet 11   omeprazole (PRILOSEC) 20 MG capsule Take 20 mg by mouth daily. (Patient not taking: Reported on 11/01/2024)     No current facility-administered medications for this visit.    Allergies:   Albuterol     Social History:   reports that he quit smoking about 47 years ago. His smoking use included cigarettes. He started smoking about 48 years ago. He has a 3 pack-year smoking history. He has never used smokeless tobacco. He reports that he does not drink alcohol and does not use drugs.   Family History:  family history is not on file.    ROS:     Review of Systems  Constitutional: Negative.   HENT: Negative.    Eyes: Negative.   Respiratory: Negative.     Gastrointestinal: Negative.   Genitourinary: Negative.   Musculoskeletal: Negative.   Skin: Negative.   Neurological: Negative.   Endo/Heme/Allergies: Negative.   Psychiatric/Behavioral: Negative.    All other systems reviewed and are negative.     All other systems are reviewed and negative.    PHYSICAL EXAM: VS:  BP 108/66   Pulse 92   Ht 6' 2 (1.88 m)   Wt (!) 302 lb 12.8 oz (137.3 kg)   SpO2 95%   BMI 38.88 kg/m  , BMI Body mass index is 38.88 kg/m. Last weight:  Wt Readings from Last 3 Encounters:  11/01/24 (!) 302 lb 12.8 oz (137.3 kg)  10/14/24 300 lb (136.1 kg)  10/06/24 300 lb (136.1 kg)     Physical Exam Vitals reviewed.  Constitutional:      Appearance: Normal appearance. He is normal weight.  HENT:     Head: Normocephalic.     Nose: Nose normal.     Mouth/Throat:     Mouth: Mucous membranes are moist.  Eyes:     Pupils: Pupils are equal, round, and reactive to light.  Cardiovascular:     Rate and Rhythm: Normal rate and regular rhythm.     Pulses: Normal pulses.     Heart sounds: Normal heart sounds.  Pulmonary:     Effort: Pulmonary effort is normal.  Abdominal:     General: Abdomen is flat. Bowel sounds are normal.  Musculoskeletal:        General: Normal range of motion.     Cervical back: Normal range of motion.  Skin:    General: Skin is warm.  Neurological:     General: No focal deficit present.     Mental Status: He is alert.  Psychiatric:        Mood and Affect: Mood normal.       EKG:   Recent Labs: 04/10/2024: ALT 19 05/23/2024: Magnesium 1.8 07/23/2024: B Natriuretic Peptide 717.7 10/05/2024: BUN 9; Creatinine, Ser 1.11; Hemoglobin 14.3; Platelets 213; Potassium 4.1; Sodium 136 10/06/2024: Pro Brain Natriuretic Peptide 1,865.0    Lipid Panel No results found for: CHOL, TRIG, HDL, CHOLHDL, VLDL, LDLCALC, LDLDIRECT    Other studies Reviewed: Additional studies/ records that were reviewed today include:   Review of the above records demonstrates:      04/12/2018   11:13 AM  PAD Screen  Previous PAD dx? No  Previous surgical procedure? No  Pain with  walking? No  Feet/toe relief with dangling? No  Painful, non-healing ulcers? No  Extremities discolored? No      ASSESSMENT AND PLAN:    ICD-10-CM   1. Acute HFrEF (heart failure with reduced ejection fraction) (HCC)  I50.21    On toresemide instead of lasix . LVEF 37%.    2. Acute coronary syndrome (HCC)  I24.9     3. Cardiopathy  I51.9     4. Primary hypertension  I10     5. PAD (peripheral artery disease)  I73.9     6. SOB (shortness of breath)  R06.02     7. Atrial fibrillation with rapid ventricular response (HCC)  I48.91    now NSR       Problem List Items Addressed This Visit       Cardiovascular and Mediastinum   Acute coronary syndrome (HCC)   Acute HFrEF (heart failure with reduced ejection fraction) (HCC) - Primary   Hypertension   PAD (peripheral artery disease)   Cardiopathy     Other   SOB (shortness of breath)   Other Visit Diagnoses       Atrial fibrillation with rapid ventricular response (HCC)       now NSR          Disposition:   Return in about 4 weeks (around 11/29/2024).    Total time spent: 30 minutes  Signed,  Denyse Bathe, MD  11/01/2024 9:33 AM    [facility] "

## 2024-11-06 ENCOUNTER — Ambulatory Visit
Admission: RE | Admit: 2024-11-06 | Discharge: 2024-11-06 | Disposition: A | Discharge status: Home / Self Care | Source: Ambulatory Visit | Attending: Student in an Organized Health Care Education/Training Program | Admitting: Student in an Organized Health Care Education/Training Program

## 2024-11-06 ENCOUNTER — Ambulatory Visit: Admitting: Student in an Organized Health Care Education/Training Program

## 2024-11-06 ENCOUNTER — Encounter: Payer: Self-pay | Admitting: Student in an Organized Health Care Education/Training Program

## 2024-11-06 VITALS — BP 106/72 | HR 104 | Temp 98.2°F | Resp 18 | Ht 74.0 in | Wt 300.0 lb

## 2024-11-06 DIAGNOSIS — Z0289 Encounter for other administrative examinations: Secondary | ICD-10-CM | POA: Insufficient documentation

## 2024-11-06 DIAGNOSIS — M47816 Spondylosis without myelopathy or radiculopathy, lumbar region: Secondary | ICD-10-CM | POA: Insufficient documentation

## 2024-11-06 MED ORDER — DEXAMETHASONE SOD PHOSPHATE PF 10 MG/ML IJ SOLN
20.0000 mg | Freq: Once | INTRAMUSCULAR | Status: AC
  Administered 2024-11-06: 20 mg

## 2024-11-06 MED ORDER — LIDOCAINE HCL 2 % IJ SOLN
20.0000 mL | Freq: Once | INTRAMUSCULAR | Status: AC
  Administered 2024-11-06: 400 mg

## 2024-11-06 MED ORDER — ROPIVACAINE HCL 2 MG/ML IJ SOLN
INTRAMUSCULAR | Status: AC
  Filled 2024-11-06: qty 20

## 2024-11-06 MED ORDER — LIDOCAINE HCL 2 % IJ SOLN
INTRAMUSCULAR | Status: AC
  Filled 2024-11-06: qty 20

## 2024-11-06 MED ORDER — ROPIVACAINE HCL 2 MG/ML IJ SOLN
18.0000 mL | Freq: Once | INTRAMUSCULAR | Status: AC
  Administered 2024-11-06: 18 mL via PERINEURAL

## 2024-11-06 MED ORDER — HYDROCODONE-ACETAMINOPHEN 5-325 MG PO TABS: 1.0000 | ORAL_TABLET | Freq: Two times a day (BID) | ORAL | 0 refills | Status: DC | PRN

## 2024-11-06 MED ORDER — DEXAMETHASONE SOD PHOSPHATE PF 10 MG/ML IJ SOLN
INTRAMUSCULAR | Status: AC
  Filled 2024-11-06: qty 2

## 2024-11-06 NOTE — Progress Notes (Signed)
 Safety precautions to be maintained throughout the outpatient stay will include: orient to surroundings, keep bed in low position, maintain call bell within reach at all times, provide assistance with transfer out of bed and ambulation.

## 2024-11-06 NOTE — Patient Instructions (Signed)

## 2024-11-06 NOTE — Progress Notes (Signed)
 PROVIDER NOTE: Interpretation of information contained herein should be left to medically-trained personnel. Specific patient instructions are provided elsewhere under Patient Instructions section of medical record. This document was created in part using STT-dictation technology, any transcriptional errors that may result from this process are unintentional.  Patient: Ronald Horne Type: Established DOB: [birth year] MRN: [id number] PCP: [name], NP  Service: Procedure DOS: 11/06/2024 Setting: Ambulatory Location: Ambulatory outpatient facility Delivery: Face-to-face Provider: Wallie Sherry, MD Specialty: Interventional Pain Management Specialty designation: 09 Location: Outpatient facility Ref. Prov.: Patel, Seema K, NP       Interventional Therapy   Procedure: Lumbar Facet, Medial Branch Radiofrequency Ablation (RFA) #1  Laterality: Bilateral (-50)  Level: L3, L4, and L5 Medial Branch Level(s). These levels will denervate the L3-4 and L4-5 lumbar facet joints.  Imaging: Fluoroscopy-guided         Anesthesia: Local anesthesia (1-2% Lidocaine ) DOS: 11/06/2024  Performed by: Wallie Sherry, MD  Purpose: Therapeutic/Palliative Indications: Low back pain severe enough to impact quality of life or function. Indications: 1. Lumbar spondylosis   2. Lumbar facet arthropathy   3. Pain management contract signed    Ronald Horne has been dealing with the above chronic pain for longer than three months and has either failed to respond, was unable to tolerate, or simply did not get enough benefit from other more conservative therapies including, but not limited to: 1. Over-the-counter medications 2. Anti-inflammatory medications 3. Muscle relaxants 4. Membrane stabilizers 5. Opioids 6. Physical therapy and/or chiropractic manipulation 7. Modalities (Heat, ice, etc.) 8. Invasive techniques such as nerve blocks. Ronald Horne has attained more than 50% relief of the pain from a series of  diagnostic injections conducted in separate occasions.  Pain Score: Pre-procedure: 8 /10 Post-procedure: 0-No pain/10     Patient stopped Eliquis  3 days prior  Position / Prep / Materials:  Position: Prone  Prep solution: ChloraPrep (2% chlorhexidine gluconate and 70% isopropyl alcohol) Prep Area: Entire Lumbosacral Region (Lower back from mid-thoracic region to end of tailbone and from flank to flank.) Materials:  Tray: RFA (Radiofrequency) tray Needle(s):  Type: RFA (Teflon-coated radiofrequency ablation needles) Gauge (G): 22  Length: Long (15cm) Qty: 6     H&P (Pre-op Assessment):  Ronald Horne is a 74 y.o. (year old), male patient, seen today for interventional treatment. He  has a past surgical history that includes Tonsillectomy; Epidural block injection; and LEFT HEART CATH AND CORONARY ANGIOGRAPHY (Left, 12/27/2019). Ronald Horne has a current medication list which includes the following prescription(s): albuterol , amiodarone , apple cider vinegar, atorvastatin , digoxin , eliquis , famotidine, farxiga , fluticasone-salmeterol, hydrocodone -acetaminophen , levalbuterol, methimazole , metoprolol , multivitamin with minerals, ohtuvayre , potassium chloride , sacubitril -valsartan , spironolactone , torsemide , and omeprazole. His primarily concern today is the Back Pain (Lumbar bilateral )  Initial Vital Signs:  Pulse/HCG Rate: (!) 104ECG Heart Rate: (!) 106 Temp: 98.2 F (36.8 C) Resp: 16 BP: (!) 88/69 SpO2: 96 %  BMI: Estimated body mass index is 38.52 kg/m as calculated from the following:   Height as of this encounter: 6' 2 (1.88 m).   Weight as of this encounter: 300 lb (136.1 kg).  Risk Assessment: Allergies: Reviewed. He is allergic to albuterol .  Allergy Precautions: None required Coagulopathies: Reviewed. None identified.  Blood-thinner therapy: None at this time Active Infection(s): Reviewed. None identified. Ronald Horne is afebrile  Site Confirmation: Ronald Horne was asked to  confirm the procedure and laterality before marking the site Procedure checklist: Completed Consent: Before the procedure and under the influence of no sedative(s), amnesic(s), or anxiolytics,  the patient was informed of the treatment options, risks and possible complications. To fulfill our ethical and legal obligations, as recommended by the American Medical Association's Code of Ethics, I have informed the patient of my clinical impression; the nature and purpose of the treatment or procedure; the risks, benefits, and possible complications of the intervention; the alternatives, including doing nothing; the risk(s) and benefit(s) of the alternative treatment(s) or procedure(s); and the risk(s) and benefit(s) of doing nothing. The patient was provided information about the general risks and possible complications associated with the procedure. These may include, but are not limited to: failure to achieve desired goals, infection, bleeding, organ or nerve damage, allergic reactions, paralysis, and death. In addition, the patient was informed of those risks and complications associated to Spine-related procedures, such as failure to decrease pain; infection (i.e.: Meningitis, epidural or intraspinal abscess); bleeding (i.e.: epidural hematoma, subarachnoid hemorrhage, or any other type of intraspinal or peri-dural bleeding); organ or nerve damage (i.e.: Any type of peripheral nerve, nerve root, or spinal cord injury) with subsequent damage to sensory, motor, and/or autonomic systems, resulting in permanent pain, numbness, and/or weakness of one or several areas of the body; allergic reactions; (i.e.: anaphylactic reaction); and/or death. Furthermore, the patient was informed of those risks and complications associated with the medications. These include, but are not limited to: allergic reactions (i.e.: anaphylactic or anaphylactoid reaction(s)); adrenal axis suppression; blood sugar elevation that in diabetics  may result in ketoacidosis or comma; water retention that in patients with history of congestive heart failure may result in shortness of breath, pulmonary edema, and decompensation with resultant heart failure; weight gain; swelling or edema; medication-induced neural toxicity; particulate matter embolism and blood vessel occlusion with resultant organ, and/or nervous system infarction; and/or aseptic necrosis of one or more joints. Finally, the patient was informed that Medicine is not an exact science; therefore, there is also the possibility of unforeseen or unpredictable risks and/or possible complications that may result in a catastrophic outcome. The patient indicated having understood very clearly. We have given the patient no guarantees and we have made no promises. Enough time was given to the patient to ask questions, all of which were answered to the patient's satisfaction. Mr. Blankenbaker has indicated that he wanted to continue with the procedure. Attestation: I, the ordering provider, attest that I have discussed with the patient the benefits, risks, side-effects, alternatives, likelihood of achieving goals, and potential problems during recovery for the procedure that I have provided informed consent. Date  Time: 11/06/2024  8:18 AM  Pre-Procedure Preparation:  Monitoring: As per clinic protocol. Respiration, ETCO2, SpO2, BP, heart rate and rhythm monitor placed and checked for adequate function Safety Precautions: Patient was assessed for positional comfort and pressure points before starting the procedure. Time-out: I initiated and conducted the Time-out before starting the procedure, as per protocol. The patient was asked to participate by confirming the accuracy of the Time Out information. Verification of the correct person, site, and procedure were performed and confirmed by me, the nursing staff, and the patient. Time-out conducted as per Joint Commission's Universal Protocol  (UP.01.01.01). Time: 0913 Start Time: 0913 hrs.  Description of Procedure:          Laterality: See above. Levels:  See above. Safety Precautions: Aspiration looking for blood return was conducted prior to all injections. At no point did we inject any substances, as a needle was being advanced. Before injecting, the patient was told to immediately notify me if he was experiencing  any new onset of ringing in the ears, or metallic taste in the mouth. No attempts were made at seeking any paresthesias. Safe injection practices and needle disposal techniques used. Medications properly checked for expiration dates. SDV (single dose vial) medications used. After the completion of the procedure, all disposable equipment used was discarded in the proper designated medical waste containers. Local Anesthesia: Protocol guidelines were followed. The patient was positioned over the fluoroscopy table. The area was prepped in the usual manner. The time-out was completed. The target area was identified using fluoroscopy. A 12-in long, straight, sterile hemostat was used with fluoroscopic guidance to locate the targets for each level blocked. Once located, the skin was marked with an approved surgical skin marker. Once all sites were marked, the skin (epidermis, dermis, and hypodermis), as well as deeper tissues (fat, connective tissue and muscle) were infiltrated with a small amount of a short-acting local anesthetic, loaded on a 10cc syringe with a 25G, 1.5-in  Needle. An appropriate amount of time was allowed for local anesthetics to take effect before proceeding to the next step. Technical description of process:  Radiofrequency Ablation (RFA) L3 Medial Branch Nerve RFA: The target area for the L3 medial branch is at the junction of the postero-lateral aspect of the superior articular process and the superior, posterior, and medial edge of the transverse process of L4. Under fluoroscopic guidance, a Radiofrequency  needle was inserted until contact was made with os over the superior postero-lateral aspect of the pedicular shadow (target area). Sensory and motor testing was conducted to properly adjust the position of the needle. Once satisfactory placement of the needle was achieved, the numbing solution was slowly injected after negative aspiration for blood. 2.0 mL of the nerve block solution was injected without difficulty or complication. After waiting for at least 3 minutes, the ablation was performed. Once completed, the needle was removed intact. L4 Medial Branch Nerve RFA: The target area for the L4 medial branch is at the junction of the postero-lateral aspect of the superior articular process and the superior, posterior, and medial edge of the transverse process of L5. Under fluoroscopic guidance, a Radiofrequency needle was inserted until contact was made with os over the superior postero-lateral aspect of the pedicular shadow (target area). Sensory and motor testing was conducted to properly adjust the position of the needle. Once satisfactory placement of the needle was achieved, the numbing solution was slowly injected after negative aspiration for blood. 2.0 mL of the nerve block solution was injected without difficulty or complication. After waiting for at least 3 minutes, the ablation was performed. Once completed, the needle was removed intact. L5 Medial Branch Nerve RFA: The target area for the L5 medial branch is at the junction of the postero-lateral aspect of the superior articular process of S1 and the superior, posterior, and medial edge of the sacral ala. Under fluoroscopic guidance, a Radiofrequency needle was inserted until contact was made with os over the superior postero-lateral aspect of the pedicular shadow (target area). Sensory and motor testing was conducted to properly adjust the position of the needle. Once satisfactory placement of the needle was achieved, the numbing solution was slowly  injected after negative aspiration for blood. 2.0 mL of the nerve block solution was injected without difficulty or complication. After waiting for at least 3 minutes, the ablation was performed. Once completed, the needle was removed intact. Radiofrequency lesioning (ablation):  Radiofrequency Generator: Medtronic AccurianTM AG 1000 RF Generator Sensory Stimulation Parameters: 50 Hz was used  to locate & identify the nerve, making sure that the needle was positioned such that there was no sensory stimulation below 0.3 V or above 0.7 V. Motor Stimulation Parameters: 2 Hz was used to evaluate the motor component. Care was taken not to lesion any nerves that demonstrated motor stimulation of the lower extremities at an output of less than 2.5 times that of the sensory threshold, or a maximum of 2.0 V. Lesioning Technique Parameters: Standard Radiofrequency settings. (Not bipolar or pulsed.) Temperature Settings: 80 degrees C Lesioning time: 60 seconds Stationary intra-operative compliance: Compliant  Once the entire procedure was completed, the treated area was cleaned, making sure to leave some of the prepping solution back to take advantage of its long term bactericidal properties.    Illustration of the posterior view of the lumbar spine and the posterior neural structures. Laminae of L2 through S1 are labeled. DPRL5, dorsal primary ramus of L5; DPRS1, dorsal primary ramus of S1; DPR3, dorsal primary ramus of L3; FJ, facet (zygapophyseal) joint L3-L4; I, inferior articular process of L4; LB1, lateral branch of dorsal primary ramus of L1; IAB, inferior articular branches from L3 medial branch (supplies L4-L5 facet joint); IBP, intermediate branch plexus; MB3, medial branch of dorsal primary ramus of L3; NR3, third lumbar nerve root; S, superior articular process of L5; SAB, superior articular branches from L4 (supplies L4-5 facet joint also); TP3, transverse process of L3.  Facet Joint Innervation (*  possible contribution)  L1-2 T12, L1 (L2*)  Medial Branch  L2-3 L1, L2 (L3*)                     L3-4 L2, L3 (L4*)                     L4-5 L3, L4 (L5*)                     L5-S1 L4, L5, S1                        Vitals:   11/06/24 0922 11/06/24 0926 11/06/24 0931 11/06/24 0936  BP: 112/84 112/83 110/80 106/72  Pulse:      Resp: 18 20 16 18   Temp:      TempSrc:      SpO2: 94% 98% 96% 98%  Weight:      Height:        Start Time: 0913 hrs. End Time: 0936 hrs.  Imaging Guidance (Spinal):         Type of Imaging Technique: Fluoroscopy Guidance (Spinal) Indication(s): Fluoroscopy guidance for needle placement to enhance accuracy in procedures requiring precise needle localization for targeted delivery of medication in or near specific anatomical locations not easily accessible without such real-time imaging assistance. Exposure Time: Please see nurses notes. Contrast: None used. Fluoroscopic Guidance: I was personally present during the use of fluoroscopy. Tunnel Vision Technique used to obtain the best possible view of the target area. Parallax error corrected before commencing the procedure. Direction-depth-direction technique used to introduce the needle under continuous pulsed fluoroscopy. Once target was reached, antero-posterior, oblique, and lateral fluoroscopic projection used confirm needle placement in all planes. Images permanently stored in EMR. Interpretation: No contrast injected. I personally interpreted the imaging intraoperatively. Adequate needle placement confirmed in multiple planes. Permanent images saved into the patient's record.  Antibiotic Prophylaxis:   Anti-infectives (From admission, onward)    None      Indication(s): None identified  Post-operative Assessment:  Post-procedure Vital Signs:  Pulse/HCG Rate: (!) 104(!) 105 Temp: 98.2 F (36.8 C) Resp: 18 BP: 106/72 SpO2: 98 %  EBL: None  Complications: No immediate post-treatment  complications observed by team, or reported by patient.  Note: The patient tolerated the entire procedure well. A repeat set of vitals were taken after the procedure and the patient was kept under observation following institutional policy, for this type of procedure. Post-procedural neurological assessment was performed, showing return to baseline, prior to discharge. The patient was provided with post-procedure discharge instructions, including a section on how to identify potential problems. Should any problems arise concerning this procedure, the patient was given instructions to immediately contact us , at any time, without hesitation. In any case, we plan to contact the patient by telephone for a follow-up status report regarding this interventional procedure.  Comments:  No additional relevant information.  Plan of Care (POC)  Okay to restart Eliquis  tomorrow Patient is endorsing cervicalgia predominantly on the right side as well as right shoulder pain.  At his follow-up visit with Seema can further evaluate and consider cervical facet medial branch nerve block versus right suprascapular nerve block   We will also be taking over the patient's hydrocodone  for chronic pain management.  This was previously prescribed by Camelia Novak, the patient's primary care provider.  The patient takes a dose of 5 mg twice daily as needed.  We will continue that dose.  Patient counseled on constipation management.  UDS appropriate.  Patient will sign pain contract.  PMP checked and reviewed. Orders:  Orders Placed This Encounter  Procedures   DG PAIN [facility] C-ARM 1-60 MIN NO REPORT    Intraoperative interpretation by procedural physician at [facility].    Standing Status:   Standing    Number of Occurrences:   1    Reason for exam::   Assistance in needle guidance and placement for procedures requiring needle placement in or near specific anatomical locations not easily accessible without  such assistance.     Medications ordered for procedure: Meds ordered this encounter  Medications   lidocaine  (XYLOCAINE ) 2 % (with pres) injection 400 mg   ropivacaine  (PF) 2 mg/mL (0.2%) (NAROPIN ) injection 18 mL   dexamethasone  (DECADRON ) injection 20 mg   HYDROcodone -acetaminophen  (NORCO/VICODIN) 5-325 MG tablet    Sig: Take 1 tablet by mouth every 12 (twelve) hours as needed for severe pain (pain score 7-10). Must last 30 days    Dispense:  60 tablet    Refill:  0    Chronic Pain: STOP Act (Not applicable) Fill 1 day early if closed on refill date. Avoid benzodiazepines within 8 hours of opioids   Medications administered: We administered lidocaine , ropivacaine  (PF) 2 mg/mL (0.2%), and dexamethasone .  See the medical record for exact dosing, route, and time of administration.    Bilateral L3, L4, L5 medial branch nerve block #1 08/12/2024, #2: 09/16/24, right cervical ESI 08/12/2024 Right SSNB #1 09/16/24 B/L L3,4,5 RFA 11/06/24    Follow-up plan:   Return in about 4 weeks (around 12/04/2024) for PPE, MM F2F, Seema.     Recent Visits Date Type Provider Dept  10/14/24 Office Visit Patel, Seema K, NP [facility]  09/16/24 Procedure visit Marcelino Nurse, MD [facility]  09/05/24 Office Visit Marcelino Nurse, MD [facility]  08/12/24 Procedure visit Marcelino Nurse, MD [facility]  Showing recent visits within past 90 days and meeting all other requirements Today's Visits Date  Type Provider Dept  11/06/24 Procedure visit Marcelino Nurse, MD [facility]  Showing today's visits and meeting all other requirements Future Appointments Date Type Provider Dept  12/04/24 Appointment Patel, Seema K, NP [facility]  Showing future appointments within next 90 days and meeting all other requirements   Disposition: Discharge home  Discharge (Date  Time): 11/06/2024; 0947 hrs.   Primary Care Physician: [name], NP Location:  [facility] Note by: Nurse Marcelino, MD (TTS technology used. I apologize for any typographical errors that were not detected and corrected.) Date: 11/06/2024; Time: 10:45 AM  Disclaimer:  Medicine is not an visual merchandiser. The only guarantee in medicine is that nothing is guaranteed. It is important to note that the decision to proceed with this intervention was based on the information collected from the patient. The Data and conclusions were drawn from the patient's questionnaire, the interview, and the physical examination. Because the information was provided in large part by the patient, it cannot be guaranteed that it has not been purposely or unconsciously manipulated. Every effort has been made to obtain as much relevant data as possible for this evaluation. It is important to note that the conclusions that lead to this procedure are derived in large part from the available data. Always take into account that the treatment will also be dependent on availability of resources and existing treatment guidelines, considered by other Pain Management Practitioners as being common knowledge and practice, at the time of the intervention. For Medico-Legal purposes, it is also important to point out that variation in procedural techniques and pharmacological choices are the acceptable norm. The indications, contraindications, technique, and results of the above procedure should only be interpreted and judged by a Board-Certified Interventional Pain Specialist with extensive familiarity and expertise in the same exact procedure and technique.

## 2024-11-07 ENCOUNTER — Telehealth: Payer: Self-pay

## 2024-11-07 NOTE — Telephone Encounter (Signed)
 Post procedure follow up.  Patient states he feels a lot better.

## 2024-11-11 ENCOUNTER — Telehealth: Payer: Self-pay | Admitting: *Deleted

## 2024-11-11 NOTE — Telephone Encounter (Signed)
 Attempted to call patient, message left.

## 2024-11-11 NOTE — Telephone Encounter (Signed)
-----   Message from Wallie Sherry, MD sent at 11/11/2024 10:57 AM EST ----- Regarding: RE: Hydrocodone  Q4 hours is too frequently given his cardiac and respiratory issues We are also focusing on interventional therapy which should help with his pain, requiring less opioid medications Any future appts should be with Seema for MM ----- Message ----- From: Shela Reda CROME, RN Sent: 11/11/2024   9:20 AM EST To: Wallie Sherry, MD Subject: Hydrocodone                                     Pt asking why you changed his dose of Hydrocodone  5/325.  Ronald Horne was prescribing one every 4 hours, you changed it to one 2 times per day.

## 2024-11-13 ENCOUNTER — Other Ambulatory Visit: Payer: Self-pay | Admitting: Cardiovascular Disease

## 2024-11-13 DIAGNOSIS — I480 Paroxysmal atrial fibrillation: Secondary | ICD-10-CM

## 2024-11-13 DIAGNOSIS — G4733 Obstructive sleep apnea (adult) (pediatric): Secondary | ICD-10-CM

## 2024-11-13 DIAGNOSIS — R0602 Shortness of breath: Secondary | ICD-10-CM

## 2024-11-13 DIAGNOSIS — R000 Tachycardia, unspecified: Secondary | ICD-10-CM

## 2024-11-13 DIAGNOSIS — I200 Unstable angina: Secondary | ICD-10-CM

## 2024-11-13 DIAGNOSIS — I5021 Acute systolic (congestive) heart failure: Secondary | ICD-10-CM

## 2024-11-13 DIAGNOSIS — R0789 Other chest pain: Secondary | ICD-10-CM

## 2024-11-13 DIAGNOSIS — I519 Heart disease, unspecified: Secondary | ICD-10-CM

## 2024-11-19 ENCOUNTER — Telehealth: Payer: Self-pay | Admitting: Cardiovascular Disease

## 2024-11-19 NOTE — Telephone Encounter (Signed)
 Pt was wondering if he can take AMIODARONE  & DIGOXIN  together?

## 2024-11-28 ENCOUNTER — Encounter: Payer: Self-pay | Admitting: Cardiovascular Disease

## 2024-11-28 ENCOUNTER — Ambulatory Visit (INDEPENDENT_AMBULATORY_CARE_PROVIDER_SITE_OTHER): Admitting: Cardiovascular Disease

## 2024-11-28 VITALS — BP 112/60 | HR 105 | Ht 74.0 in | Wt 304.6 lb

## 2024-11-28 DIAGNOSIS — R0789 Other chest pain: Secondary | ICD-10-CM

## 2024-11-28 DIAGNOSIS — I249 Acute ischemic heart disease, unspecified: Secondary | ICD-10-CM

## 2024-11-28 DIAGNOSIS — R0602 Shortness of breath: Secondary | ICD-10-CM | POA: Diagnosis not present

## 2024-11-28 DIAGNOSIS — I480 Paroxysmal atrial fibrillation: Secondary | ICD-10-CM | POA: Diagnosis not present

## 2024-11-28 DIAGNOSIS — R000 Tachycardia, unspecified: Secondary | ICD-10-CM | POA: Diagnosis not present

## 2024-11-28 DIAGNOSIS — I739 Peripheral vascular disease, unspecified: Secondary | ICD-10-CM

## 2024-11-28 DIAGNOSIS — I5021 Acute systolic (congestive) heart failure: Secondary | ICD-10-CM | POA: Diagnosis not present

## 2024-11-28 DIAGNOSIS — I10 Essential (primary) hypertension: Secondary | ICD-10-CM | POA: Diagnosis not present

## 2024-11-28 DIAGNOSIS — I519 Heart disease, unspecified: Secondary | ICD-10-CM | POA: Diagnosis not present

## 2024-11-28 DIAGNOSIS — G4733 Obstructive sleep apnea (adult) (pediatric): Secondary | ICD-10-CM | POA: Diagnosis not present

## 2024-11-28 NOTE — Progress Notes (Signed)
 "     Cardiology Office Note   Date:  11/28/2024   ID:  Ronald Horne, DOB 1951-04-04, MRN [id number]  PCP:  [name], NP  Cardiologist:  Denyse Bathe, MD      History of Present Illness: Ronald Horne is a 74 y.o. male who presents for  Chief Complaint  Patient presents with   Follow-up    4 week follow up    Has SOB.      Past Medical History:  Diagnosis Date   Arthritis    Cardiomyopathy (HCC)    a. 08/2016 Echo: EF 30-35%, diff HK; b. 03/2018 Ex MV ([facility]): Ex time 2:00, large anter defect, EF 24%; c. 03/2018 Echo: EF 20-25%, diff HK. Gr1 DD; d. Refused cath.   Chronic back pain    Chronic combined systolic (congestive) and diastolic (congestive) heart failure (HCC)    a. 08/2016 Echo: EF 30-35%, diff HK, mild to mod MR, mild BAE; b. 03/2018 Echo: EF 20-25%, diff HK. Gr1 DD. MIldly dil Ao root. Mild BAE. Mildly reduced RV fxn.   COPD (chronic obstructive pulmonary disease) (HCC)    GERD (gastroesophageal reflux disease)    Gout    Hypertension    Obesity    Sleep apnea      Past Surgical History:  Procedure Laterality Date   EPIDURAL BLOCK INJECTION     chronic back pain    LEFT HEART CATH AND CORONARY ANGIOGRAPHY Left 12/27/2019   Procedure: LEFT HEART CATH AND CORONARY ANGIOGRAPHY;  Surgeon: Bathe Denyse LABOR, MD;  Location: [facility];  Service: Cardiovascular;  Laterality: Left;   TONSILLECTOMY       Current Outpatient Medications  Medication Sig Dispense Refill   albuterol  (VENTOLIN  HFA) 108 (90 Base) MCG/ACT inhaler Inhale 1-2 puffs into the lungs every 6 (six) hours as needed for shortness of breath or wheezing.     amiodarone  (PACERONE ) 200 MG tablet TAKE 1 TABLET (200 MG TOTAL) BY MOUTH DAILY. 30 tablet 2   APPLE CIDER VINEGAR PO Take 15 mLs by mouth in the morning and at bedtime.     atorvastatin  (LIPITOR) 20 MG tablet Take 20 mg by mouth in the morning.     digoxin  (LANOXIN ) 0.25 MG tablet Take 1 tablet (250 mcg total) by mouth daily.  30 tablet 11   ELIQUIS  5 MG TABS tablet TAKE 1 TABLET TWICE A DAY 60 tablet 0   FARXIGA  10 MG TABS tablet TAKE 1 TABLET DAILY 30 tablet 6   fluticasone-salmeterol (ADVAIR) 250-50 MCG/ACT AEPB Inhale 1 puff into the lungs 2 (two) times daily.     HYDROcodone -acetaminophen  (NORCO/VICODIN) 5-325 MG tablet Take 1 tablet by mouth every 12 (twelve) hours as needed for severe pain (pain score 7-10). Must last 30 days 60 tablet 0   levalbuterol (XOPENEX HFA) 45 MCG/ACT inhaler Inhale into the lungs.     methimazole  (TAPAZOLE ) 5 MG tablet Take 5 mg by mouth in the morning.     metoprolol  (TOPROL  XL) 200 MG 24 hr tablet Take 1 tablet (200 mg total) by mouth daily. 30 tablet 11   Multiple Vitamin (MULTIVITAMIN WITH MINERALS) TABS tablet Take 1 tablet by mouth daily.     OHTUVAYRE  3 MG/2.5ML SUSP      potassium chloride  (KLOR-CON ) 10 MEQ tablet Take 10 mEq by mouth daily.     sacubitril -valsartan  (ENTRESTO ) 97-103 MG TAKE 1 TABLET BY MOUTH TWICE A DAY 60 tablet 6   spironolactone  (ALDACTONE ) 25 MG tablet TAKE  1/2 TABLET BY MOUTH DAILY 15 tablet 6   torsemide  (DEMADEX ) 10 MG tablet Take 1 tablet (10 mg total) by mouth 2 (two) times daily. 60 tablet 11   No current facility-administered medications for this visit.    Allergies:   Albuterol     Social History:   reports that he quit smoking about 47 years ago. His smoking use included cigarettes. He started smoking about 48 years ago. He has a 3 pack-year smoking history. He has never used smokeless tobacco. He reports that he does not drink alcohol and does not use drugs.   Family History:  family history is not on file.    ROS:     Review of Systems  Constitutional: Negative.   HENT: Negative.    Eyes: Negative.   Respiratory: Negative.    Gastrointestinal: Negative.   Genitourinary: Negative.   Musculoskeletal: Negative.   Skin: Negative.   Neurological: Negative.   Endo/Heme/Allergies: Negative.   Psychiatric/Behavioral: Negative.    All  other systems reviewed and are negative.     All other systems are reviewed and negative.    PHYSICAL EXAM: VS:  BP 112/60   Pulse (!) 105   Ht 6' 2 (1.88 m)   Wt (!) 304 lb 9.6 oz (138.2 kg)   SpO2 94%   BMI 39.11 kg/m  , BMI Body mass index is 39.11 kg/m. Last weight:  Wt Readings from Last 3 Encounters:  11/28/24 (!) 304 lb 9.6 oz (138.2 kg)  11/06/24 300 lb (136.1 kg)  11/01/24 (!) 302 lb 12.8 oz (137.3 kg)     Physical Exam Vitals reviewed.  Constitutional:      Appearance: Normal appearance. He is normal weight.  HENT:     Head: Normocephalic.     Nose: Nose normal.     Mouth/Throat:     Mouth: Mucous membranes are moist.  Eyes:     Pupils: Pupils are equal, round, and reactive to light.  Cardiovascular:     Rate and Rhythm: Normal rate and regular rhythm.     Pulses: Normal pulses.     Heart sounds: Normal heart sounds.  Pulmonary:     Effort: Pulmonary effort is normal.  Abdominal:     General: Abdomen is flat. Bowel sounds are normal.  Musculoskeletal:        General: Normal range of motion.     Cervical back: Normal range of motion.  Skin:    General: Skin is warm.  Neurological:     General: No focal deficit present.     Mental Status: He is alert.  Psychiatric:        Mood and Affect: Mood normal.       EKG:   Recent Labs: 04/10/2024: ALT 19 05/23/2024: Magnesium 1.8 07/23/2024: B Natriuretic Peptide 717.7 10/05/2024: BUN 9; Creatinine, Ser 1.11; Hemoglobin 14.3; Platelets 213; Potassium 4.1; Sodium 136 10/06/2024: Pro Brain Natriuretic Peptide 1,865.0    Lipid Panel No results found for: CHOL, TRIG, HDL, CHOLHDL, VLDL, LDLCALC, LDLDIRECT    Other studies Reviewed: Additional studies/ records that were reviewed today include:  Review of the above records demonstrates:      04/12/2018   11:13 AM  PAD Screen  Previous PAD dx? No  Previous surgical procedure? No  Pain with walking? No  Feet/toe relief with dangling?  No  Painful, non-healing ulcers? No  Extremities discolored? No      ASSESSMENT AND PLAN:    ICD-10-CM   1. Paroxysmal atrial fibrillation (  HCC)  I48.0     2. Other chest pain  R07.89     3. Sinus tachycardia  R00.0     4. Obstructive sleep apnea syndrome  G47.33     5. SOB (shortness of breath)  R06.02     6. Acute HFrEF (heart failure with reduced ejection fraction) (HCC)  I50.21    on GDMT    7. Cardiopathy  I51.9     8. Primary hypertension  I10     9. Acute coronary syndrome (HCC)  I24.9     10. PAD (peripheral artery disease)  I73.9        Problem List Items Addressed This Visit       Cardiovascular and Mediastinum   Acute coronary syndrome (HCC)   Acute HFrEF (heart failure with reduced ejection fraction) (HCC)   Hypertension   PAD (peripheral artery disease)   Cardiopathy     Respiratory   Obstructive sleep apnea syndrome     Other   SOB (shortness of breath)   Sinus tachycardia   Other chest pain   Other Visit Diagnoses       Paroxysmal atrial fibrillation (HCC)    -  Primary          Disposition:   Return in about 4 weeks (around 12/26/2024).    Total time spent: 30 minutes  Signed,  Denyse Bathe, MD  11/28/2024 9:51 AM    [facility] "

## 2024-12-04 ENCOUNTER — Ambulatory Visit: Admitting: Nurse Practitioner

## 2024-12-04 ENCOUNTER — Encounter: Payer: Self-pay | Admitting: Nurse Practitioner

## 2024-12-04 VITALS — BP 119/76 | HR 98 | Temp 99.3°F | Resp 16 | Ht 74.0 in | Wt 304.0 lb

## 2024-12-04 DIAGNOSIS — M75101 Unspecified rotator cuff tear or rupture of right shoulder, not specified as traumatic: Secondary | ICD-10-CM | POA: Diagnosis not present

## 2024-12-04 DIAGNOSIS — M47816 Spondylosis without myelopathy or radiculopathy, lumbar region: Secondary | ICD-10-CM | POA: Diagnosis not present

## 2024-12-04 DIAGNOSIS — M47812 Spondylosis without myelopathy or radiculopathy, cervical region: Secondary | ICD-10-CM | POA: Diagnosis not present

## 2024-12-04 DIAGNOSIS — G894 Chronic pain syndrome: Secondary | ICD-10-CM

## 2024-12-04 DIAGNOSIS — Z0289 Encounter for other administrative examinations: Secondary | ICD-10-CM | POA: Insufficient documentation

## 2024-12-04 DIAGNOSIS — M12811 Other specific arthropathies, not elsewhere classified, right shoulder: Secondary | ICD-10-CM | POA: Diagnosis not present

## 2024-12-04 DIAGNOSIS — Z9229 Personal history of other drug therapy: Secondary | ICD-10-CM

## 2024-12-04 DIAGNOSIS — M5412 Radiculopathy, cervical region: Secondary | ICD-10-CM

## 2024-12-04 DIAGNOSIS — Z79899 Other long term (current) drug therapy: Secondary | ICD-10-CM | POA: Diagnosis not present

## 2024-12-04 DIAGNOSIS — M4722 Other spondylosis with radiculopathy, cervical region: Secondary | ICD-10-CM

## 2024-12-04 MED ORDER — HYDROCODONE-ACETAMINOPHEN 5-325 MG PO TABS: 1.0000 | ORAL_TABLET | Freq: Two times a day (BID) | ORAL | 0 refills | Status: AC | PRN

## 2024-12-04 NOTE — Patient Instructions (Signed)
 " ______________________________________________________________________    Opioid Pain Medication Update  To: All patients taking opioid pain medications. (I.e.: hydrocodone , hydromorphone, oxycodone , oxymorphone, morphine, codeine, methadone, tapentadol , tramadol, buprenorphine, fentanyl , etc.)  Re: Updated review of side effects and adverse reactions of opioid analgesics, as well as new information about long term effects of this class of medications.  Direct risks of long-term opioid therapy are not limited to opioid addiction and overdose. Potential medical risks include serious fractures, breathing problems during sleep, hyperalgesia, immunosuppression, chronic constipation, bowel obstruction, myocardial infarction, and tooth decay secondary to xerostomia.  Unpredictable adverse effects that can occur even if you take your medication correctly: Cognitive impairment, respiratory depression, and death. Most people think that if they take their medication correctly, and as instructed, that they will be safe. Nothing could be farther from the truth. In reality, a significant amount of recorded deaths associated with the use of opioids has occurred in individuals that had taken the medication for a long time, and were taking their medication correctly. The following are examples of how this can happen: Patient taking his/her medication for a long time, as instructed, without any side effects, is given a certain antibiotic or another unrelated medication, which in turn triggers a Drug-to-drug interaction leading to disorientation, cognitive impairment, impaired reflexes, respiratory depression or an untoward event leading to serious bodily harm or injury, including death.  Patient taking his/her medication for a long time, as instructed, without any side effects, develops an acute impairment of liver and/or kidney function. This will lead to a rapid inability of the body to breakdown and eliminate  their pain medication, which will result in effects similar to an overdose, but with the same medicine and dose that they had always taken. This again may lead to disorientation, cognitive impairment, impaired reflexes, respiratory depression or an untoward event leading to serious bodily harm or injury, including death.  A similar problem will occur with patients as they grow older and their liver and kidney function begins to decrease as part of the aging process.  Background information: Historically, the original case for using long-term opioid therapy to treat chronic noncancer pain was based on safety assumptions that subsequent experience has called into question. In 1996, the American Pain Society and the American Academy of Pain Medicine issued a consensus statement supporting long-term opioid therapy. This statement acknowledged the dangers of opioid prescribing but concluded that the risk for addiction was low; respiratory depression induced by opioids was short-lived, occurred mainly in opioid-naive patients, and was antagonized by pain; tolerance was not a common problem; and efforts to control diversion should not constrain opioid prescribing. This has now proven to be wrong. Experience regarding the risks for opioid addiction, misuse, and overdose in community practice has failed to support these assumptions.  According to the Centers for Disease Control and Prevention, fatal overdoses involving opioid analgesics have increased sharply over the past decade. Currently, more than 96,700 people die from drug overdoses every year. Opioids are a factor in 7 out of every 10 overdose deaths. Deaths from drug overdose have surpassed motor vehicle accidents as the leading cause of death for individuals between the ages of 2 and 72.  Clinical data suggest that neuroendocrine dysfunction may be very common in both men and women, potentially causing hypogonadism, erectile dysfunction, infertility,  decreased libido, osteoporosis, and depression. Recent studies linked higher opioid dose to increased opioid-related mortality. Controlled observational studies reported that long-term opioid therapy may be associated with increased risk for cardiovascular events.  Subsequent meta-analysis concluded that the safety of long-term opioid therapy in elderly patients has not been proven.   Side Effects and adverse reactions: Common side effects: Drowsiness (sedation). Dizziness. Nausea and vomiting. Constipation. Physical dependence -- Dependence often manifests with withdrawal symptoms when opioids are discontinued or decreased. Tolerance -- As you take repeated doses of opioids, you require increased medication to experience the same effect of pain relief. Respiratory depression -- This can occur in healthy people, especially with higher doses. However, people with COPD, asthma or other lung conditions may be even more susceptible to fatal respiratory impairment.  Uncommon side effects: An increased sensitivity to feeling pain and extreme response to pain (hyperalgesia). Chronic use of opioids can lead to this. Delayed gastric emptying (the process by which the contents of your stomach are moved into your small intestine). Muscle rigidity. Immune system and hormonal dysfunction. Quick, involuntary muscle jerks (myoclonus). Arrhythmia. Itchy skin (pruritus). Dry mouth (xerostomia).  Long-term side effects: Chronic constipation. Sleep-disordered breathing (SDB). Increased risk of bone fractures. Hypothalamic-pituitary-adrenal dysregulation. Increased risk of overdose.  RISKS: Respiratory depression and death: Opioids increase the risk of respiratory depression and death.  Drug-to-drug interactions: Opioids are relatively contraindicated in combination with benzodiazepines, sleep inducers, and other central nervous system depressants. Other classes of medications (i.e.: certain antibiotics  and even over-the-counter medications) may also trigger or induce respiratory depression in some patients.  Medical conditions: Patients with pre-existing respiratory problems are at higher risk of respiratory failure and/or depression when in combination with opioid analgesics. Opioids are relatively contraindicated in some medical conditions such as central sleep apnea.   Fractures and Falls:  Opioids increase the risk and incidence of falls. This is of particular importance in elderly patients.  Endocrine System:  Long-term administration is associated with endocrine abnormalities (endocrinopathies). (Also known as Opioid-induced Endocrinopathy) Influences on both the hypothalamic-pituitary-adrenal axis?and the hypothalamic-pituitary-gonadal axis have been demonstrated with consequent hypogonadism and adrenal insufficiency in both sexes. Hypogonadism and decreased levels of dehydroepiandrosterone sulfate have been reported in men and women. Endocrine effects include: Amenorrhoea in women (abnormal absence of menstruation) Reduced libido in both sexes Decreased sexual function Erectile dysfunction in men Hypogonadisms (decreased testicular function with shrinkage of testicles) Infertility Depression and fatigue Loss of muscle mass Anxiety Depression Immune suppression Hyperalgesia Weight gain Anemia Osteoporosis Patients (particularly women of childbearing age) should avoid opioids. There is insufficient evidence to recommend routine monitoring of asymptomatic patients taking opioids in the long-term for hormonal deficiencies.  Immune System: Human studies have demonstrated that opioids have an immunomodulating effect. These effects are mediated via opioid receptors both on immune effector cells and in the central nervous system. Opioids have been demonstrated to have adverse effects on antimicrobial response and anti-tumour surveillance. Buprenorphine has been demonstrated to have  no impact on immune function.  Opioid Induced Hyperalgesia: Human studies have demonstrated that prolonged use of opioids can lead to a state of abnormal pain sensitivity, sometimes called opioid induced hyperalgesia (OIH). Opioid induced hyperalgesia is not usually seen in the absence of tolerance to opioid analgesia. Clinically, hyperalgesia may be diagnosed if the patient on long-term opioid therapy presents with increased pain. This might be qualitatively and anatomically distinct from pain related to disease progression or to breakthrough pain resulting from development of opioid tolerance. Pain associated with hyperalgesia tends to be more diffuse than the pre-existing pain and less defined in quality. Management of opioid induced hyperalgesia requires opioid dose reduction.  Cancer: Chronic opioid therapy has been associated with an increased  risk of cancer among noncancer patients with chronic pain. This association was more evident in chronic strong opioid users. Chronic opioid consumption causes significant pathological changes in the small intestine and colon. Epidemiological studies have found that there is a link between opium dependence and initiation of gastrointestinal cancers. Cancer is the second leading cause of death after cardiovascular disease. Chronic use of opioids can cause multiple conditions such as GERD, immunosuppression and renal damage as well as carcinogenic effects, which are associated with the incidence of cancers.   Mortality: Long-term opioid use has been associated with increased mortality among patients with chronic non-cancer pain (CNCP).  Prescription of long-acting opioids for chronic noncancer pain was associated with a significantly increased risk of all-cause mortality, including deaths from causes other than overdose.  Reference: Von Korff M, Kolodny A, Deyo RA, Chou R. Long-term opioid therapy reconsidered. Ann Intern Med. 2011 Sep 6;155(5):325-8. doi:  10.7326/[id number]. PMID: 78106373; PMCID: [id number]. Kit JINNY Laurence CINDERELLA Pearley JINNY, Hayward RA, Dunn KM, Jordan KP. Risk of adverse events in patients prescribed long-term opioids: A cohort study in the [place] Clinical Practice Research [employer]. Eur J Pain. 2019 May;23(5):908-922. doi: 10.1002/ejp.1357. Epub 2019 Jan 31. PMID: 69379883. Colameco S, Coren JS, Ciervo CA. Continuous opioid treatment for chronic noncancer pain: a time for moderation in prescribing. Postgrad Med. 2009 Jul;121(4):61-6. doi: 10.3810/pgm.2009.07.2032. PMID: 80358728. Gigi JONELLE Shlomo MILUS Levern IVER Conny RN, Calverton SD, Blazina I, Lonell DASEN, Bougatsos C, Deyo RA. The effectiveness and risks of long-term opioid therapy for chronic pain: a systematic review for a [employer] of [employer] Pathways to [employer]. Ann Intern Med. 2015 Feb 17;162(4):276-86. doi: 10.7326/M14-2559. PMID: 74418742. Rory CHRISTELLA Laurence [facility], [place] DM. NCHS Data Brief No. 22. [place]: Centers for Disease Control and Prevention; 2009. Sep, Increase in Fatal Poisonings Involving Opioid Analgesics in the [place] , 1999-2006. Song IA, Choi HR, Oh TK. Long-term opioid use and mortality in patients with chronic non-cancer pain: Ten-year follow-up study in [place] from 2010 through 2019. EClinicalMedicine. 2022 Jul 18;51:101558. doi: 10.1016/j.eclinm.2022.898441. PMID: 64124182; PMCID: EFR0695089. Huser, W., Schubert, T., Vogelmann, T. et al. All-cause mortality in patients with long-term opioid therapy compared with non-opioid analgesics for chronic non-cancer pain: a database study. BMC Med 18, 162 (2020). [email or web address].info/ Rashidian H, Zendehdel K, Kamangar F, Malekzadeh R, Haghdoost AA. An Ecological Study of the Association between Opiate Use and Incidence of Cancers. Addict Health. 2016 Fall;8(4):252-260. PMID: 71180443; PMCID: [id number].  Our Goal: Our goal is to control your pain with means other  than the use of opioid pain medications.  Our Recommendation: Talk to your physician about coming off of these medications. We can assist you with the tapering down and stopping these medicines. Based on the new information, even if you cannot completely stop the medication, a decrease in the dose may be associated with a lesser risk. Ask for other means of controlling the pain. Decrease or eliminate those factors that significantly contribute to your pain such as smoking, obesity, and a diet heavily tilted towards inflammatory nutrients.  Last Updated: 05/08/2023   ______________________________________________________________________       ______________________________________________________________________    Update on Controlled Substance (Opioid) Regulations   To: All patients taking opioid pain medications. (I.e.: hydrocodone , hydromorphone, oxycodone , oxymorphone, morphine, codeine, methadone, tapentadol , tramadol, buprenorphine, fentanyl , etc.)  Re: Review on the state of controlled substance regulations.  Introduction: Rules and regulations associated with all aspects of controlled substances are constantly being modified. Unfortunately we have encountered patients questioning the veracity of the  information that we provide them about these changes. This is intended to provide them with appropriate references and a historical review of these changes.  A Brief History: As of August 15, 2016, the [place]  Government declared the opioid epidemic a public health emergency. Prescription drug monitoring programs (PDMPs) and the [facility] All Schedules Prescription Electronic Reporting Act (NASPER). Before 1800, clinicians regarded pain as an existential phenomenon, a consequence of aging. There was no regulation on the use of cocaine and opioids, resulting in widespread marketing and prescribing for many ailments ranging from diarrhea to toothache. The [employer] of  [id number], passed in response to the sudden emergence of street heroin abuse as well as iatrogenic morphine dependence, influenced both physician and patient alike to avoid opiates. Patients with unexplained pain in the 1920s were regarded as deluded, malingering, or abusers, and cancer patients through the 1950s were encouraged to wean themselves off opioids until their lives could be measured in weeks. Alongside this opioid evolution, the American Pain Society launched their influential pain as the fifth vital sign campaign in 1995. Concurrently, pharmaceutical companies introduced new formulations, such as extended release oxycodone  (OxyContin ). From 1997 to 2002, OxyContin  prescriptions increased from 670,000 to 6.2 million. However, concerns soon began to surface regarding overzealous opioid treatment. It must be noted that pharmaceutical companies contributed significantly to the rise of the opioid epidemic, receiving considerable reprimands as a consequence. In 2007, as the opioid epidemic began to inflict profound damage, [employer] pleaded guilty to federal charges related to the misbranding of OxyContin . [employer] agreed to pay a total of $634.5 million to resolve Justice Department investigations, as well as a $19.5 million settlement to [address] and the [address] of [place].  In response to the current epidemic, changes in focus to the development of new abuse deterrent opioid formulations at the [place]  Food and Drug Administration (FDA) as well as drafting of new public standards for pain treatment were created at [facility] in 2017. In response to the opioid epidemic, FDA public policy changes were announced in February 2016. Among these new positions were a re-examination of the risk-benefit paradigm for opioids with strict emphasis on the large public health ramifications. The various modified opioids released over the past 20 years, such as tamper-resistant preparation, have had differing levels of success,  and are collectively referred to as Risk Evaluation and Mitigation Strategies (REMS). There is also a growing focus on preventing opioid use disorder (OUD) and on offering affected individuals accessible and effective treatment. [place]  government policy reflects these changes and both the Affordable Care Act and the Mental Health Parity and Addiction Equity Act were major steps forward in treating opioid addiction. The Affordable Care Act, which was signed into law in 2010, with major provisions coming into effect by 2014.  In the 1990s, the intensified marketing of newly reformulated prescription opioid medications (e.g., OxyContin ) and an influential pain advocacy campaign that encouraged greater pain management led to a precipitous rise in opioid use in the [place] . Research from the Centers for Disease Control and Prevention (CDC) shows that prescription opioid sales in the [place]  quadrupled from 1999 to 2010. At the same time, opioid misuse and opioid-involved overdose deaths increased (Figure 1). Between 1999 and 2010, the rate of opioidinvolved overdose deaths in the [place]  doubled from 2.9 to 6.8 deaths per 100,000 people. This initial rise in opioid-related deaths is often referred to as the first wave of the recent opioid crisis.  Between 1999 and 2020,  565,000 Americans died of opioid-involved overdoses. In turn, federal, state, and local governments responded with various legal and policy efforts to curb opioid misuse and drug-related overdose Deaths.  Recent Congresses have enacted several laws addressing the opioid crisis, such as the Comprehensive Addiction and Recovery Act of 2016 (CARA, P.L. 114-198); the 21st Century Cures Act (P.L. 114-255); the Substance UseDisorder Prevention that Promotes Opioid Recovery and Treatment for Patients and Communities Act (SUPPORT Act, P.L. 856-732-5788); the Fentanyl  Sanctions Act (Title LXXII of P.L. [id number]); and the Blocking  Deadly Fentanyl  Imports Act (P.L. 117-81, 6610). These laws addressed overprescribing and misuse of opioids, expanded substance use disorder prevention and treatment capacities, bolstered drug diversion capabilities, and enhanced international drug interdiction, counternarcotics cooperation, and sanctions efforts. Congress also directed additional funds to many of these initiatives through appropriations.  Congress provided funding in the [employer] Act of 2021 [phone]; P.L. 117-2) for syringe services programs (often known as needle exchange programs) and other harm reduction initiatives. Federal and state harm reduction strategies have frequently involved the distribution of naloxone  (e.g., Narcan )--a medication used to reverse an opioid overdose--and test strips used to detect fentanyl  in drug samples.  The Department of Justice (DOJ) and Department of Homeland Security [facility]) aim to reduce the diversion of prescription opioids and the use, manufacturing, and trafficking of illicit opioids. DOJ--via the Drug Enforcement Administration (DEA)--regulates opioid manufacturers, distributors, and dispensers; it also controls the opioid supply through enforcement of regulatory requirements.  A History of Opiate Laws in the [place]   Prior to 1890, laws concerning opiates were strictly imposed on a local city or state-by-state basis. One of the first was in [place] in 1875 where it became illegal to smoke opium only in opium dens. It did not ban the sale, import or use otherwise. In the next 25 years different states enacted opium laws ranging from outlawing opium dens altogether to making possession of opium, morphine and heroin without a physicians prescription illegal.  The first Congressional Act took place in 1890 that levied taxes on morphine and opium. From that time on the [employer] has had a series of laws and acts directly aimed at opiate use, abuse  and control. These are outlined below:  1906 - Pure Food and Drug Act Preventing the manufacture, sale, or transportation of adulterated or misbranded or poisonous or deleterious foods, drugs, medicines, and liquors, and for regulating traffic therein, and for other purposes. Punishment included fines and prison time.  1909   - Smoking Opium Exclusion Act Banned the importation, possession and use of smoking opium. Did not regulate opium-based medications. First Freight forwarder banning the non-medical use of a substance.  1914  - The Margrette Act In summary, The Margrette Act of 1914 was written more to have all parties involved in importing, exporting, set designer and distributing opium or cocaine to register with the [employer] and have taxes levied upon them. Exempt from the law were physicians operating in the course of his professional practice  1919 - Supreme Court ratified the Bj's in [place] et al., v. [place]  and [place]  v. [place], then again in [facility] v. [place] , in 1920, holding that doctors may not prescribe maintenance supplies of narcotics to people addicted to narcotics. However, it does not prohibit doctors from prescribing narcotics to wean a patient off of the drug. It was also the opinion of the court that prescribing narcotics to habitual users was not considered professional practice hence  it then was considered illegal for doctors to prescribe opioids for the purposes of maintaining an addiction. It can be argued that todays addiction medications are not intended to maintain an addiction but to facilitate addiction remission. In which case, this opinion of the court should not preclude practitioners from prescribing buprenorphine or methadone to patients suffering from an addictive disorder.  1924  - Heroin Act Architectural technologist, importation and possession of heroin illegal - even for medicinal use.  1922 -- Narcotic  Drug Import and Export Act Enacted to assure proper control of importation, sale, possession, production and consumption of narcotics.  1927  -- Special Educational Needs Teacher of [employer] of Prohibition was responsible for tracking bootleggers and organized conservation officer, historic buildings. They focused primarily on interstate and international cases and those cases where local law enforcement official would not or could not act.  1932 -- Uniform State Narcotic Act Encouraged states to pass uniform state laws matching the federal Narcotic Drug Import and Export Act. Suggested prohibiting cannabis use at the state level.  19 -- Food, Drug, and Cosmetic Act The new law brought cosmetics and medical devices under control, and it required that drugs be labeled with adequate directions for safe use. Moreover, it mandated pre-market approval of all new drugs, such that a manufacturer would have to prove to FDA that a drug were safe before it could be sold  1951 -- Boggs Act Imposed maximum criminal penalties for violations of the import/export and internal revenue laws related to drugs and also established mandatory minimum prison sentences.  1956 -- Narcotics Control Act Increased Boggs Act penalties and mandatory prison sentence minimums for violations of existing drug laws.  1965 -- Drug Abuse Control Amendment Enacted to deal with problems caused by abuse of depressants, stimulants and hallucinogens. Restricted research into psychoactive drugs such as LSD by requiring FDA approval.  1970 -- Controlled Substance Act  Controlled Substances Import and Export Act These laws are a consolidation of numerous laws regulating the manufacture and distribution of narcotics, stimulants, depressants, hallucinogens, anabolic steroids, and chemicals used in the illicit production of controlled substances. The CSA places all substances that are regulated under existing federal law into one of five schedules. This placement is based upon  the substance's medicinal value, harmfulness, and potential for abuse or addiction. Schedule I is reserved for the most dangerous drugs that have no recognized medical use, while Schedule V is the classification used for the least dangerous drugs. The act also provides a mechanism for substances to be controlled, added to a schedule, decontrolled, removed from control, rescheduled, or transferred from one schedule to another.  60 - Drug Enforcement Agency By Executive Order, the DEA was formed to take place of the [employer] of Narcotics and Dangerous Drugs.  65 -Narcotic Addict Treatment Act of  1974  - Public Law [phone] Amends the Controlled Substance Act of 1970 to provide for the registration of practitioners conducting narcotic treatment programs. [methadone clinics] It also provides legal definitions for the phrases maintenance treatment and detoxification treatment.  1986 -- Anti-Drug Abuse Act of 1986 Strengthened Federal efforts to encourage foreign cooperation in eradicating illicit drug crops and in halting international drug traffic, to improve enforcement of Federal drug laws and enhance interdiction of illicit drug shipments, to provide strong Market researcher in establishing effective drug abuse prevention and education programs, to [employer] support for drug abuse treatment and rehabilitation efforts, and for other purposes. It also re-imposed mandatory sentencing minimums depending on which drug and  how much was involved.  1988 -- Anti-Drug Abuse Act of 1988 Established the Office of Materials Engineer (ONDCP) in the [employer] of the Economist; authorized funds for [employer], state and local drug enforcement activities, school-based drug prevention efforts, and drug abuse treatment with special emphasis on injecting drug abusers at high risk for AIDS.  2000 -- Federal - The Drug Addiction Treatment Act of 2000 (DATA 2000) It enables qualified physicians to  prescribe and/or dispense narcotics for the purpose of treating opioid dependency. For the first time, physicians are able to treat this disease from their private offices or other clinical settings. This presents a very desirable treatment option for those who are unwilling or unable to seek help in drug treatment clinics. Patients can now be treated in the privacy of their doctors office, as are other people being treated for any other type of medical condition. One medicine doctors may now prescribe is Buprenorphine. The major downfall of this Act is the limitation of 30 patients per practice - which means that large facilities, no matter how many physicians are there, can only treat 30 patients at a time.  2002-- DEA reschedules buprenorphine from a schedule V drug to a schedule III drug, on August 06, 2001 - the day before the FDA approval of Suboxone and Subutex despite overwhelming objection by the medical community.  2004: June 2004 THE CONFIDENTIALITY OF ALCOHOL AND DRUG ABUSE PATIENT RECORDS REGULATION AND THE HIPAA PRIVACY RULE:  Confidentiality of Alcohol and Drug Dependence Patient Records (summary) Code of Federal Regulations Title 42 Part 2 (42 CFR Part 2)  The confidentiality of alcohol and drug dependence patient records maintained by this practice/program is protected by federal law and regulations. Generally, the practice/program may not say to a person outside the practice/program that a patient attends the practice/program, or disclose any information identifying a patient as being alcohol or drug dependent unless:  The patient consents in writing; The disclosure is allowed by a court order, or The disclosure is made to medical personnel in a medical emergency or to qualified personnel for research,  audit, or practice/program evaluation. Violation of the federal law and regulations by a practice/program is a crime. Suspected violations may be reported to appropriate authorities  in accordance with federal regulations. Freight forwarder and regulations do not protect any information about a crime committed by a patient either at the practice/program or against any person who works for the practice/program or about any threat to commit such a crime. Federal laws and regulations do not protect any information about suspected child abuse or neglect from being reported under state law to appropriate state or local authorities.  sample consent form (MS-WORD)  2005: 06-01-2004 Public law [phone], Amends the Controlled Substances Act to eliminate the 30-patient limit for medical group practices allowed to dispense narcotic drugs in schedules III, IV, or V for maintenance or detoxification treatment (retains the 30-patient limit for an individual physician). This amendment removes the 30-patient limit on group medical practices that treat opioid dependence with buprenorphine. The restriction was part of the original Drug Addiction Treatment Act of 2000 (DATA) that allowed treatment of opioid dependence in a doctor's office. With this change, every certified doctor may now prescribe buprenorphine up to his or her individual physician limit of 30 patients.  2006: On 10/28/2005 President Levy signed Bill H.R.6344 into law. This allows physicians who have been certified to prescribe certain drugs for the treatment of opioid dependence under DATA2000 to treat up  to 100 patients (up from 30) by submitting an intent notification to the Dept of Health and [employer]. This is a major step forward in both fighting the stigma and allowing access to treatment previously not available to some. For more details see 30/100-PATIENT LIMIT  2016: HHS augments regulations concerning the 30/100 patient limit by raising the limit to 275 for qualifying physicians. Link to summary of regulation  2016: Comprehensive Addiction and Recovery Act of 2016 (sec.303) amends the Controlled Substance Act - to allow Nurse  Practitioners and Physician Assistants to become eligible to prescribe buprenorphine for the treatment of opioid use disorder. See the entire law for more details.  The roots of the concurrent regulation of certain drugs under two statutory schemes go back to the beginning of this century. In 1906, Congress enacted the Pure Food and Drug Act, establishing one regime of regulation to assure (among other things) that drugs were not adulterated or misbranded. These regulations were amended several times, recodified in 1938, and expanded on again from the 1940s through the 1990s. Their implementation and enforcement is today assigned to the Food and Drug Administration (FDA) in the Department of Health and Human Services [facility]).  In 1914, Congress adopted the [place] Narcotic Act to stop abuse of addictive drugs. The [place] Narcotic Act was amended in 1937 to include marijuana. In 1965, amphetamines, barbiturates, and hallucinogens came under regulation, but under the [employer], Drug, and Cosmetic Act. In 1970, these various statutes were consolidated and recodified as the Controlled Substances Act (CSA), which has been amended several times since then. Its implementation and enforcement is today assigned to the Drug Enforcement Administration (DEA) in the Department of Justice.  The first clash occurred after World War I, when so-called morphine clinics existed and physicians prescribed or dispensed morphine to addicts. Some addicts were veterans of the American Civil War, the Spanish-American War, and WWI, who had become addicted during treatment for war wounds, but most of them came from the growing population of nonmedical addicts ([place], 8017). The Narcotics Division of the Prohibition Unit of the Department of the Treasury, which was then responsible for enforcing the [employer] Narcotic Act, concluded that this activity was not the legitimate practice of medicine but simple drug trafficking. The  Treasury Department swiftly closed the clinics and made it personally and professionally risky for physicians to maintain a narcotic addict for any reason. In did so, however, only after the American Medical Association had adopted a resolution, in 1920, opposing ambulatory clinics''.  In 1972, the public health establishment, including the Secretary of Health, Education, and Welfare, the Education Officer, Environmental, the [employer] of [employer], and the Chemical Engineer for Drug Abuse Prevention, was unprepared to allow [employer] of Narcotics and Dangerous Drugs, DEA's predecessor agency, to unilaterally define the parameters of medical practice for the use of methadone in the treatment of heroin addiction. As a consequence, a new set of rules--the third, on top of the FDA and DEA schemes--was added, one that inserted FDA deeply into the practice of medicine, notwithstanding its protestations to the contrary. Congress ratified this joint responsibility of law enforcement and public health officials for methadone through this third set of rules in 1974 with the passage of the Narcotic Addict Treatment Act (NATA). To examine in detail the evolution of this third set of rules--commonly referred to as the FDA or DHHS methadone regulations--we turn, first, to the period of the mid-1960s.  Increased use of heroin in  the post World War II period first became apparent in the early to mid 1950s. During the [employer], a minimum mandatory narcotics law was enacted in 1956, effective July 1957. 1962 [facility] conference on drug abuse, the [employer] on Narcotic and Drug Abuse (the [employer]) of 1963, the Drug Abuse Control Amendments of 1965, the President's Commission on [employer] and Administration of Justice (the [employer]) of [phone], and the Narcotic Addiction Rehabilitation Act of 1966.  The 1965 Drug Abuse Control Amendments  brought under strict federal control all nonnarcotic drugs capable of producing serious psychotoxic effects when abused. This act also created the [employer] of Drug Abuse Control within the Department of Health, Education, and Welfare (DHEW) and shifted the basis for aon corporation of illegal drugs from tax principles (administered by the Department of Treasury) to the regulation of commerce (administered by the [employer]).  The 1966 Narcotic Addiction Rehabilitation Act TOUR MANAGER) authorized the civil commitment of narcotic addicts, and federal assistance to state and local governments to develop a local system of drug treatment programs. With respect to the latter, the [employer] Mills of Mental Health [facility]) initially proposed the gradual implementation of the state assistance effort, mainly through a common mental health mechanism--inpatient treatment programs. However, because of a perceived pressing need, the courts began to commit addicts to these programs even before they were officially opened or staffed. The NARA legislation imposed the following contract requirements on treatment centers: (1) thrice-a-week counseling sessions; (2) weekly urine tests; (3) restorative dental services; (4) psychological consultations and vocational training; and (5) the treatment modalities of drug-free outpatient, therapeutic community, and methadone maintenance. Reorganization Plan No. 1 of 1968 transferred the primary functions of the [employer] of Narcotics (FBN) from the [employer] to the Department of Justice; it also transferred the [employer] of Drug Abuse Control functions to the Department of Justice. Within the [employer], the [employer] of Narcotics and Dangerous Drugs (BNDD) was created, which became the Drug Enforcement Administration in 1973.   Under the first [place] administration [phone]), federal drug abuse policy developed in a significant way. These developments included a 1969 war  on drugs presidential message, resulting legislation in 1970, and a Special Action Office created by executive order in 1971 and authorized in statute in 1972. Brynn, in 1969, to send a message to Congress on drug abuse. Although this was the first time that a U.S. president invoked the war on drugs image, it was in retrospect the most balanced approach to the problem of drug abuse that had been advanced. The 1969 message resulted in the submission of legislation to the Congress and the passage, the following year, of the Comprehensive Drug Abuse Prevention and Control Act of 1970 [employer] [phone], August 26, 1969). The act dealt with research, treatment, and prevention of drug abuse and drug dependence, and with drug abuse charity fundraiser. One major purpose of the 1970 legislation was to reverse some of the strictures of the [employer] of 1914. The 1970 act sought to clarify for the medical profession . . . the extent to which they may safely go in treating narcotic addicts as patients. Title I, in Section IV, charged the Surveyor, Minerals, Education, and Welfare, to determine the appropriate methods of professional practice in the medical treatment of the narcotic addiction of various classes of narcotic addicts. This provision constitutes the initial statutory basis for treatment standards. The law enforcement sections consolidated all prior federal statutes into  the Controlled Substances Act and the Controlled Substances Export and Import Act (Titles II and III, respectively, of the Comprehensive Drug Abuse Prevention and Control Act of 1970). Under this legislation, substances were classified under five schedules according to their abuse potential, and psychological and physical effects. Methadone was placed in Schedule II, along with such opiate drugs as morphine, codeine, and hydrocodone .  One of the most important steps taken by President Brynn was to establish in June 1971 the  Special Action Office for Drug Abuse Prevention (SAODAP) in the [employer] of the President ([place] [phone], April 16, 1970). In mid-1971, [facility] appointed Dr. Maple Dunnings as SAODAP director. Within a year, the Drug Abuse Prevention Office and Treatment Act of 1972 [employer] [phone], January 19, 1971) gave statutory authority to [facility], but limiting setting, on April 29, 1974, as the limit on its existence.  The purpose of the 1972 act was to bring the resources of the federal government to bear on drug abuse with the immediate objective of significantly reducing its incidence and developing a comprehensive, coordinated long-term federal strategy to combat drug abuse.  Narcotic Addict Treatment Act [employer]) of 1974 [employer] [phone]), which amended the Controlled Substances Act. This legislation was driven by concern for the diversion of methadone to illicit channels that was occurring in 1972 and 1973, as reflected in the title of the Senate bill adopted on April 07, 1972, the Methadone Diversion Control Act of 1973. (U.S. Senate, 1970a, 8029a).  The 1980 final rule (45 FR 37305, July 20, 1979) reduced the minimum standard for admission from two years of addiction to one year coupled with a clinical determination that the individual was currently physiologically.  The regulations were next revised in 1989, following two proposals to modify them, one in 1983 and one in 1987.  Under President Tanda Corrente, a government-wide effort was made to review all federal government regulations and to eliminate or reduce the burden of these regulations on the private sector, state and nash-finch company, and wps resources.   The 1983 recommendations, though not adopted, did initiate another revision of the methadone regulations, which first found expression in a 1987 proposed rule (52 FR 37047, August 01, 1986) and culminated in a final rule (54 FR 8954, December 31, 1987) at the end of the  decade. In the 1987 proposed rule, the FDA and NIDA, in an effort to put the best face on the unenthusiastic 1983 response by the provider community to converting the regulations to guidelines, indicated that they had retained the current requirements necessary to achieve the goals of the 1974 NATA, but were proposing to streamline the regulation and to promote more efficient operation of methadone programs. The 1987 proposed rule, issued by the FDA and NIDA, advanced the following changes in the methadone regulation: that detoxification treatment be divided into short-term (<21 days) and long-term (>21 and <180 days) treatment; that the minimum staffing ratio of one counselor to 50 patients be eliminated; that blood tests be allowed as ways to conduct initial drug screening or to meet the monthly testing requirements for six-day take-home patients; that the 72-hour notification of FDA and the pertinent state authority for methadone doses greater than 100 mg be eliminated; that special adverse reaction reporting requirements for methadone be eliminated and reliance placed upon general FDA reporting requirements; that a supervising counselor be allowed to conduct the annual review of the patient's treatment plan for certain qualified patients who had been in treatment for 3  years or longer; and that the requirement of an annual report of methadone treatment programs to the FDA be dropped. The FDA and NIDA issued a final rule on December 31, 1987, based on comments on the 1987 proposed (54 FR 8954). Concurrently, FDA and NIDA issued a six-page guidance document, which noted that the regulations, over time, had recommended certain practices that were not actually required. Public Health Service, in Congress, and elsewhere, to reorganize the Alcohol, Drug Abuse, and Mental Health Administration (ADAMHA). These efforts culminated in the [employer] of 1992 [employer] [phone], May 10, 1991), the main  purpose of which was to transfer the research portions of the three ADAMHA institutes--NIDA, the [employer] Mills of Alcoholism and Alcohol Abuse, and the [employer] Mills of Mental Health--to the [employer] and to create the Substance Abuse and Museum/gallery Exhibitions Officer [facility]) as the home for the service functions of these entitles.  Guidelines for Opioid Treatment The Federal Guidelines for Opioid Treatment Programs - 2015 serve as a guide to accrediting organizations for developing accreditation standards. The guidelines also provide OTPs with information on how programs can achieve and maintain compliance with federal regulations. The 2015 guidelines are an update to the 2007 Guidelines for the Accreditation of Opioid Treatment Programs (PDF  547 KB). The new document reflects the obligation of OTPs to deliver care consistent with the patient-centered, integrated, and recovery-oriented standards of substance use treatment.  DPT oversees the certification of OTPs and provides guidance to nonprofit organizations and state governmental entities that want to become a SAMHSA-approved accrediting body. Learn more about the accreditation and certification of OTPs and [facility] oversight of OTP accreditation bodies.  Model Guidelines for [employer] With input from [facility], the Federation of [employer] in 2013 adopted a revised version of the federations office-based opioid treatment policies. The Model Policy on DATA 2000 and Treatment of Opioid Addiction in the Medical Office - 2013 (PDF  279 KB) provides model guidelines for use by state medical boards in regulating office-based opioid treatment.  Holiday Guidance for Opioid Treatment Programs (PDF  203 KB) In response to requests for the upcoming federal holidays and ensuing weekends (December 24th, 25th, and 26th and December 31st, Jan 1st, and Jan 2nd), this letter is to provide guidance  regarding requests for unsupervised doses of medication for patients for these dates. View a sample [id number] (PDF  194 KB).  Federal regulation of drugs emerged as early as 51, under a law that addressed only imported drugs. In 1905 the [employer] launched a private, voluntary means of controlling a substantial part of the drug marketplace, a system that remained in place for over a half-century. Drug regulation in FDA has evolved considerably since President Ricardo Para signed the 1906 Pure Food and Drugs Act.  1820 Eleven doctors set up the U.S. Pharmacopeia and record the first list of standard drugs. 1848 Drug Importation Act passed by Congress requires U.S. Customs Service inspection to stop entry of tainted, low quality drugs from overseas. 8116 Dr. Mitchell MICAEL Burrs becomes the chief chemist at the [facility] of [employer] adulteration studies.  1905 The American Medical Association The [facility]) begins a voluntary program of drug approval that would last until 1955. In order to advertise in the [facility] and related journals, drug companies must show proof that the drug will treat what they claim. 1906 The original Food and Drug Act is passed by Congress on June 30 and signed by Angus Ricardo  Roosevelt. The Act outlaws states from buying and selling food, drinks, and drugs that have been mislabeled and tainted. 1911 In U.S. v. Vicci, the [employer] that the [employer] and Drugs Act does not outlaw false medical claims but only false and misleading statements about the ingredients or identity of a drug. 1912 Congress passes the Ballard Amendment to overcome the ruling in U.S. v. Vicci. The Act outlaws labeling medicines with fake medical claims that is meant to trick the buyer. 1930 The name of the Food, Drug, and Insecticide Administration is shortened to Food and Drug Administration (FDA) under an therapist, music. 1933 FDA  recommends a total rewrite of the out-of-date 1906 Food and Drugs Act.   1937 Elixir Sulfanilamide, contain the poisonous liquid, diethylene glycol, kills 107 persons, many of whom are children, dramatizing the need to establish drug safety before marketing and to pass the pending food and drug law. 1938 Congress passes [employer], Drug, and Cosmetic (FDC) Act of 1938, which requires that new drugs show safety before selling. This starts a new system of drug regulation. The Act also requires that safe limits be set for unavoidable poisonous matter and allows for factory inspections. The Directv is given power to oversee advertising for all FDAregulated products except prescription drugs. FDA states that sulfanilamide and other dangerous drugs must be given under the direction of a medical expert. This begins the requirement for prescription only (nonnarcotic) drugs (see 1951 [place] amendment). 1941 Nearly 300 deaths and injuries result from the use of sulfathiazole tablets, an antibiotic, tainted with the sedative, phenobarbital. In response, FDA drastically changes manufacturing and quality controls. These changes lead to the development of good manufacturing practices (GMPs). 1948 The [employer] in U.S. v. Floretta that FDA jurisdiction extends to retail stores, thereby allowing FDA to stop illegal sales of drugs by pharmacies including barbiturates and amphetamines. 1950 In Walgreen. v. U.S., a U.S. Court of Appeals rules that the directions for use on a drug label must include the drugs purpose. 1951 Congress passes the [place]Humphrey Amendment, which defines the kinds of drugs that cannot be used safely without medical supervision. The amendment limits sale of these drugs to prescription only by a medical professional. All other drugs are to be available without a prescription. 1952 A nationwide investigation by FDA  reveals that chloramphenicol, an antibiotic, caused nearly 180 cases of often deadly blood diseases. Two years later FDA engages the [employer] of Hospital Pharmacists, the American Association of Medical Record Librarians, and later the American Medical Association in a voluntary program of drug reaction reporting. 1953 The [employer] Amendment clarifies previous law and requires FDA to give manufacturers written reports of conditions seen during inspections and results of factory samples. 1962 Thalidomide, a new sleeping pill, causes severe birth defects of the arms and legs in thousands of babies born in [place]. The U.S. media reports on how Dr. Cathlean Mort, a FDA medical officer, helped prevent approval and marketing of Thalidomide in the [place] . These reports stirred up public support for stronger drug laws. 3 Congress passes the [employer]. For the first time, these laws require drug makers to prove their drug works before FDA can approve them for sale. The Advisory Committee on Investigational Drugs meets for the first time. This was the first meeting of a committee to advise FDA on product approval and policy on an ongoing basis. 1966 FDA contracts with the [employer] [employer]  Academy of Sciences/National Research Council to measure the effectiveness of 4,000 marketed drugs approved on the basis of safety alone between [phone] and 17-Nov-1959. The Fair Packaging and Labeling Act requires all consumer products, in interstate commerce, to be honestly and informatively labeled. Nov 18, 1967 FDA forms the Drug Efficacy Study Implementation (DESI) to carry out recommendations of the [employer] of the effectiveness of drugs first sold between [place] and 01-18-63Jan 18, 1971 FDA requires the first patient package insert, medicines must come with information for the patient about risks and benefits. 1972 Over-the-Counter Drug Review begins  to enhance the safety, effectiveness and appropriate labeling of drugs sold without prescription. 1973 The U.S. Supreme Court upholds the [place] drug effectiveness law and approves FDAs action to control entire classes of products. 1982 FDA issues Tamper-resistant Packaging Regulations to prevent poisonings such as deaths from cyanide placed in Tylenol  capsules. Congress passes the [employer] in Nov 17, 1982, making it a crime to tamper with packaged consumer products. 11-18-1983 Drug Price Advertising Account Planner Act (Hatch-Waxman Act) increases the availability of less costly generic drugs by allowing FDA to approve applications for generic versions of brand-name drugs without repeating the research that proved the safety and effectiveness of the brand-name drugs. The Act also allowed brand-name companies to apply for up to five years additional patent protection for the new medicines they developed to make up for time lost while their products were going through FDA's approval process. 1989 The FDA issued guidelines asking drug makers to decide if a drug is likely to have usefulness in elderly people and to include elderly people in studies when applicable. 1991 In 1980/11/17, the FDA and the Department of Health and Human Services published a policy on protecting people in research. In 1990/11/17, this policy is adopted by more than a dozen federal agencies involved in human subject research and becomes known as the Common Rule. 4 1993 FDA launches MedWatch, a system designed to collect reports from health professionals on problems with drugs and other medical products. FDA issues guidelines for measuring gender differences in responses to medication. Drug companies are encouraged to include patients of both sexes in their research of drugs and to study any gender-specific effects. 1995 FDA declares cigarettes to be drug delivery devices. Limits are issued on marketing and  sales to reduce smoking by young people. 1998 FDA introduces the Adverse Event Reporting System (AERS), a computerized database designed to store and study safety reports on already marketed drugs.  The Demographic Rule requires that a marketing application review data on safety and effectiveness by age, gender, and race. The Pediatric Rule requires drug makers of selected new and existing drugs to conduct studies on drug safety and effectiveness in children. 1999 Creation of the Drug Facts Label for OTC drug products. The law requires all overthe-counter drug labels to have information in a standard format. These drug facts labels are designed to give the user easy-to-find information. 2000 The U. S. [employer], upholds an earlier decision from [employer] and Drug Administration v. [employer]. et al. and rules 5-4 that FDA does not have authority to regulate tobacco as a drug. Nov 17, 2001 The Best Pharmaceuticals for Children Act, in exchange for studying the drug in children, the drug maker gets six months of selling their product without competition. Nov 17, 2002 The Pediatric Research Equity Act gives FDA the right to ask drug companies to study the effectiveness of new drugs in children. 18-Nov-2003 FDA advises medical professionals  to limit the use of a pain reliever called Cox-2, a nonsteroidal anti-inflammatory drug (NSAIDs). Studies had shown that long-term use raised chances of heart attacks and strokes. The warning is also added to the over-thecounter NSAIDs Drug Facts label. Medicines used in hospitals must have a bar code to prevent patients from receiving the wrong medicine. 5 2005 The Drug Safety Board is formed, consisting of FDA staff and representatives from the [employer] of [address] and the [employer]. The Board advises the Director, Center for Drug Evaluation and Research, FDA, on drug safety issues and works with the agency in sharing safety  information to health professionals and patients.  The [place]  Food and Drug Administration (FDA) was first created to enforce the Pure Food and Drug Act of 1906. In this capacity, the FDA is charged with protecting the health of the [place]  public, to ensure the quality of its food, medicine, and cosmetics. Before this time, the [place]  government had no formal oversight of these products and left issues of quality and purity to the individual manufactures, or at times, individual states.    Review: [place] Stop ACT. (The Strengthen Opioid Misuse Prevention (STOP) Act of 2017). GENERAL ASSEMBLY OF [place]  SESSION 2017 SESSION LAW 2017-74 HOUSE BILL 243  PMP mandatory The dispenser shall report: (1) The dispenser's DEA number. (2) The name of the patient for whom the controlled substance is being dispensed, and the patient's: a. Full address, including city, state, and zip code, b. Telephone number, and c. Date of birth. (3) The date the prescription was written. (4) The date the prescription was filled. (5) The prescription number. (6) Whether the prescription is new or a refill. (7) Metric quantity of the dispensed drug. (8) Estimated days of supply of dispensed drug, if provided to the dispenser. (9) National Drug Code of dispensed drug. (10) Prescriber's DEA number. (11) Method of payment for the prescription.  No paper prescriptions  Duration of scripts Acute vs Chronic prescribing  2016 CDC Guidelines for prescribing Opioids for Chronic Pain. (Updated in 2022.) Medical Board  Laws:  Prescription Laws Drug laws, rules, and regulations are constantly changing. Any attempt to summarize them would quickly become outdated. Because of that, the Board encourages practitioners who seek guidance on prescribing procedures to refer to the sources listed below in addition to the Boards position statements, rules and Medical Practice Act.  [place]  Board of Pharmacy  (NCBOP) (which offers the states pharmacy laws and rules, and links to the Code of Federal Regulations) [employer] Site: www.ncbop.org  [place]  General Statutes General Web Site: politicalpool.cz See: [place]  Food, Drug, and Cosmetic Act: S293155 & 106-134 See:   Pharmacy Practice Act, Article 4A: [phone] See:   Controlled Substances Act, Article 5: 90-86 & 90-113.8 See: Use of controlled substances to render one mentally incapacitated or physically helpless: [employer] [facility]. Code, Title 21, Food & Drugs www.deadiversion.usdoj.gov Controlled Substances Schedules www.deadiversion.usdoj.gov Drug Warehouse Manager - www.deadiversion.usdoj.gov 42 CFR  8.12 - Federal opioid treatment standards.   Effective June 26, 2016, prior approval will be required for opioid analgesic doses for [facility] Medicaid and [place] [facility]) beneficiaries which:  Exceed 120 mg of morphine equivalents (MME) per day  Are greater than a 14-day supply of any opioid, or,  Are non-preferred opioid products on the [place] Medicaid Preferred Drug List (PDL)  FEDERAL 42 CFR  8.12 - Federal opioid treatment standards. Title II of the Comprehensive Drug  Abuse Prevention and Control Act of 1970, commonly known as the Controlled Substance Act (CSA) Title 21 [place]  Code ([place]) Controlled Substances Act.   Reference:   ______________________________________________________________________       ______________________________________________________________________    Medication Rules  Purpose: To inform patients, and their family members, of our medication rules and regulations.  Applies to: All patients receiving prescriptions from our practice (written or electronic).  Pharmacy of record: This is the pharmacy where your electronic prescriptions will be sent. Make sure we have the correct one.  Electronic  prescriptions: In compliance with the [place]  Strengthen Opioid Misuse Prevention (STOP) Act of 2017 (Session Law 2017-74/H243), effective October 31, 2018, all controlled substances must be electronically prescribed. Written prescriptions, faxing, or calling prescriptions to a pharmacy will no longer be done.  Prescription refills: These will be provided only during in-person appointments. No medications will be renewed without a face-to-face evaluation with your provider. Applies to all prescriptions.  NOTE: The following applies primarily to controlled substances (Opioid* Pain Medications).   Type of encounter (visit): For patients receiving controlled substances, face-to-face visits are required. (Not an option and not up to the patient.)  Patient's Responsibilities: Pain Pills: Bring all pain pills to every appointment (except for procedure appointments). Pill counts are required.  Pill Bottles: Bring pills in original pharmacy bottle. Bring bottle, even if empty. Always bring the bottle of the most recent fill.  Medication refills: You are responsible for knowing and keeping track of what medications you are taking and when is it that you will need a refill. The day before your appointment: write a list of all prescriptions that need to be refilled. The day of the appointment: give the list to the admitting nurse. Prescriptions will be written only during appointments. No prescriptions will be written on procedure days. If you forget a medication: it will not be Called in, Faxed, or electronically sent. You will need to get another appointment to get these prescribed. No early refills. Do not call asking to have your prescription filled early. Partial  or short prescriptions: Occasionally your pharmacy may not have enough pills to fill your prescription.  NEVER ACCEPT a partial fill or a prescription that is short of the total amount of pills that you were prescribed.  With  controlled substances the law allows 72 hours for the pharmacy to complete the prescription.  If the prescription is not completed within 72 hours, the pharmacist will require a new prescription to be written. This means that you will be short on your medicine and we WILL NOT send another prescription to complete your original prescription.  Instead, request the pharmacy to send a carrier to a nearby branch to get enough medication to provide you with your full prescription. Prescription Accuracy: You are responsible for carefully inspecting your prescriptions before leaving our office. Have the discharge nurse carefully go over each prescription with you, before taking them home. Make sure that your name is accurately spelled, that your address is correct. Check the name and dose of your medication to make sure it is accurate. Check the number of pills, and the written instructions to make sure they are clear and accurate. Make sure that you are given enough medication to last until your next medication refill appointment. Taking Medication: Take medication as prescribed. When it comes to controlled substances, taking less pills or less frequently than prescribed is permitted and encouraged. Never take more pills than instructed. Never take the medication more frequently  than prescribed.  Inform other Doctors: Always inform, all of your healthcare providers, of all the medications you take. Pain Medication from other Providers: You are not allowed to accept any additional pain medication from any other Doctor or Healthcare provider. There are two exceptions to this rule. (see below) In the event that you require additional pain medication, you are responsible for notifying us , as stated below. Cough Medicine: Often these contain an opioid, such as codeine or hydrocodone . Never accept or take cough medicine containing these opioids if you are already taking an opioid* medication. The combination may cause  respiratory failure and death. Medication Agreement: You are responsible for carefully reading and following our Medication Agreement. This must be signed before receiving any prescriptions from our practice. Safely store a copy of your signed Agreement. Violations to the Agreement will result in no further prescriptions. (Additional copies of our Medication Agreement are available upon request.) Laws, Rules, & Regulations: All patients are expected to follow all [address] and [employer], Rules, [place] [employer]. Ignorance of the Laws does not constitute a valid excuse.  Illegal drugs and Controlled Substances: The use of illegal substances (including, but not limited to marijuana and its derivatives) and/or the illegal use of any controlled substances is strictly prohibited. Violation of this rule may result in the immediate and permanent discontinuation of any and all prescriptions being written by our practice. The use of any illegal substances is prohibited. Adopted CDC guidelines & recommendations: Target dosing levels will be at or below 60 MME/day. Use of benzodiazepines** is not recommended. Urine Drug testing: Patients taking controlled substances will be required to provide a urine sample upon request. Do not void before coming to your medication management appointments. Hold emptying your bladder until you are admitted. The admitting nurse will inform you if a sample is required. Our practice reserves the right to call you at any time to provide a sample. Once receiving the call, you have 24 hours to comply with request. Not providing a sample upon request may result in termination of medication therapy.  Exceptions: There are only two exceptions to the rule of not receiving pain medications from other Healthcare Providers. Exception #1 (Emergencies): In the event of an emergency (i.e.: accident requiring emergency care), you are allowed to receive additional pain medication. However, you are  responsible for: As soon as you are able, call our office [phone], at any time of the day or night, and leave a message stating your name, the date and nature of the emergency, and the name and dose of the medication prescribed. In the event that your call is answered by a member of our staff, make sure to document and save the date, time, and the name of the person that took your information.  Exception #2 (Planned Surgery): In the event that you are scheduled by another doctor or dentist to have any type of surgery or procedure, you are allowed (for a period no longer than 30 days), to receive additional pain medication, for the acute post-op pain. However, in this case, you are responsible for picking up a copy of our Post-op Pain Management for Surgeons handout, and giving it to your surgeon or dentist. This document is available at our office, and does not require an appointment to obtain it. Simply go to our office during business hours (Monday-Thursday from 8:00 AM to 4:00 PM) (Friday 8:00 AM to 12:00 Noon) or if you have a scheduled appointment with us , prior to your  surgery, and ask for it by name. In addition, you are responsible for: calling our office [phone], at any time of the day or night, and leaving a message stating your name, name of your surgeon, type of surgery, and date of procedure or surgery. Failure to comply with your responsibilities may result in termination of therapy involving the controlled substances.  Consequences:  Non-compliance with the above rules may result in permanent discontinuation of medication prescription therapy. All patients receiving any type of controlled substance is expected to comply with the above patient responsibilities. Not doing so may result in permanent discontinuation of medication prescription therapy. Medication Agreement Violation. Following the above rules, including your responsibilities will help you in avoiding a Medication  Agreement Violation (Breaking your Pain Medication Contract).  *Opioid medications include: morphine, codeine, oxycodone , oxymorphone, hydrocodone , hydromorphone, meperidine, tramadol, tapentadol , buprenorphine, fentanyl , methadone. **Benzodiazepine medications include: diazepam  (Valium ), alprazolam (Xanax), clonazepam (Klonopine), lorazepam (Ativan), clorazepate (Tranxene), chlordiazepoxide (Librium), estazolam (Prosom), oxazepam (Serax), temazepam (Restoril), triazolam (Halcion) (Last updated: 08/23/2023) ______________________________________________________________________     ______________________________________________________________________    Medication Recommendations and Reminders  Applies to: All patients receiving prescriptions (written and/or electronic).  Medication Rules & Regulations: You are responsible for reading, knowing, and following our Medication Rules document. These exist for your safety and that of others. They are not flexible and neither are we. Dismissing or ignoring them is an act of non-compliance that may result in complete and irreversible termination of such medication therapy. For safety reasons, non-compliance will not be tolerated. As with the U.S. fundamental legal principle of ignorance of the law is no defense, we will accept no excuses for not having read and knowing the content of documents provided to you by our practice.  Pharmacy of record:  Definition: This is the pharmacy where your electronic prescriptions will be sent.  We do not endorse any particular pharmacy. It is up to you and your insurance to decide what pharmacy to use.  We do not restrict you in your choice of pharmacy. However, once we write for your prescriptions, we will NOT be re-sending more prescriptions to fix restricted supply problems created by your pharmacy, or your insurance.  The pharmacy listed in the electronic medical record should be the one where you want  electronic prescriptions to be sent. If you choose to change pharmacy, simply notify our nursing staff. Changes will be made only during your regular appointments and not over the phone.  Recommendations: Keep all of your pain medications in a safe place, under lock and key, even if you live alone. We will NOT replace lost, stolen, or damaged medication. We do not accept Police Reports as proof of medications having been stolen. After you fill your prescription, take 1 week's worth of pills and put them away in a safe place. You should keep a separate, properly labeled bottle for this purpose. The remainder should be kept in the original bottle. Use this as your primary supply, until it runs out. Once it's gone, then you know that you have 1 week's worth of medicine, and it is time to come in for a prescription refill. If you do this correctly, it is unlikely that you will ever run out of medicine. To make sure that the above recommendation works, it is very important that you make sure your medication refill appointments are scheduled at least 1 week before you run out of medicine. To do this in an effective manner, make sure that you do not leave the  office without scheduling your next medication management appointment. Always ask the nursing staff to show you in your prescription , when your medication will be running out. Then arrange for the receptionist to get you a return appointment, at least 7 days before you run out of medicine. Do not wait until you have 1 or 2 pills left, to come in. This is very poor planning and does not take into consideration that we may need to cancel appointments due to bad weather, sickness, or emergencies affecting our staff. DO NOT ACCEPT A Partial Fill: If for any reason your pharmacy does not have enough pills/tablets to completely fill or refill your prescription, do not allow for a partial fill. The law allows the pharmacy to complete that prescription within 72  hours, without requiring a new prescription. If they do not fill the rest of your prescription within those 72 hours, you will need a separate prescription to fill the remaining amount, which we will NOT provide. If the reason for the partial fill is your insurance, you will need to talk to the pharmacist about payment alternatives for the remaining tablets, but again, DO NOT ACCEPT A PARTIAL FILL, unless you can trust your pharmacist to obtain the remainder of the pills within 72 hours.  Prescription refills and/or changes in medication(s):  Prescription refills, and/or changes in dose or medication, will be conducted only during scheduled medication management appointments. (Applies to both, written and electronic prescriptions.) No refills on procedure days. No medication will be changed or started on procedure days. No changes, adjustments, and/or refills will be conducted on a procedure day. Doing so will interfere with the diagnostic portion of the procedure. No phone refills. No medications will be called into the pharmacy. No Fax refills. No weekend refills. No Holliday refills. No after hours refills.  Remember:  Business hours are:  Monday to Thursday 8:00 AM to 4:00 PM Provider's Schedule: Eric Como, MD - Appointments are:  Medication management: Monday and Wednesday 8:00 AM to 4:00 PM Procedure day: Tuesday and Thursday 7:30 AM to 4:00 PM Wallie Sherry, MD - Appointments are:  Medication management: Tuesday and Thursday 8:00 AM to 4:00 PM Procedure day: Monday and Wednesday 7:30 AM to 4:00 PM (Last update: 08/23/2022) ______________________________________________________________________     ______________________________________________________________________    National Pain Medication Shortage  The [place] is experiencing worsening drug shortages. These have had a negative widespread effect on patient care and treatment. Not expected to improve any time soon.  Predicted to last past 2029.   Drug shortage list (generic names) Oxycodone  IR Oxycodone /APAP Oxymorphone IR Hydromorphone Hydrocodone /APAP Morphine  Where is the problem?  Manufacturing and supply level.  Will this shortage affect you?  Only if you take any of the above pain medications.  How? You may be unable to fill your prescription.  Your pharmacist may offer a partial fill of your prescription. (Warning: Do not accept partial fills.) Prescriptions partially filled cannot be transferred to another pharmacy. Read our Medication Rules and Regulation. Depending on how much medicine you are dependent on, you may experience withdrawals when unable to get the medication.  Recommendations: Consider ending your dependence on opioid pain medications. Ask your pain specialist to assist you with the process. Consider switching to a medication currently not in shortage, such as Buprenorphine. Talk to your pain specialist about this option. Consider decreasing your pain medication requirements by managing tolerance thru Drug Holidays. This may help minimize withdrawals, should you run out of medicine. Control your  pain thru the use of non-pharmacological interventional therapies.   Your prescriber: Prescribers cannot be blamed for shortages. Medication manufacturing and supply issues cannot be fixed by the prescriber.   NOTE: The prescriber is not responsible for supplying the medication, or solving supply issues. Work with your pharmacist to solve it. The patient is responsible for the decision to take or continue taking the medication and for identifying and securing a legal supply source. By law, supplying the medication is the job and responsibility of the pharmacy. The prescriber is responsible for the evaluation, monitoring, and prescribing of these medications.   Prescribers will NOT: Re-issue prescriptions that have been partially filled. Re-issue prescriptions already sent to  a pharmacy.  Re-send prescriptions to a different pharmacy because yours did not have your medication. Ask pharmacist to order more medicine or transfer the prescription to another pharmacy. (Read below.)  New 2023 regulation: July 01, 2022 Revised Regulation Allows DEA-Registered Pharmacies to Transfer Electronic Prescriptions at a Patients Request DEA Headquarters Division - Public Information Office Patients now have the ability to request their electronic prescription be transferred to another pharmacy without having to go back to their practitioner to initiate the request. This revised regulation went into effect on Monday, June 27, 2022.     At a patients request, a DEA-registered retail pharmacy can now transfer an electronic prescription for a controlled substance (schedules II-V) to another DEA-registered retail pharmacy. Prior to this change, patients would have to go through their practitioner to cancel their prescription and have it re-issued to a different pharmacy. The process was taxing and time consuming for both patients and practitioners.    The Drug Enforcement Administration [facility]) published its intent to revise the process for transferring electronic prescriptions on September 18, 2020.  The final rule was published in the federal register on May 26, 2022 and went into effect 30 days later.  Under the final rule, a prescription can only be transferred once between pharmacies, and only if allowed under existing state or other applicable law. The prescription must remain in its electronic form; may not be altered in any way; and the transfer must be communicated directly between two licensed pharmacists. Its important to note, any authorized refills transfer with the original prescription, which means the entire prescription will be filled at the same pharmacy.   Reference: [email or web address].is [facility] website announcement)  [email or web address] [employer])   [employer] / Vol. 88, No. 143 / Thursday, May 26, 2022 / Rules and Regulations DEPARTMENT OF JUSTICE  Drug Enforcement Administration  21 CFR Part 1306  [Docket No. [id number]]  RIN [id number] Transfer of Electronic Prescriptions for Schedules II-V Controlled Substances Between Pharmacies for Initial Filling  ______________________________________________________________________       ______________________________________________________________________    Transfer of Pain Medication between Pharmacies  Re: 2023 DEA Clarification on existing regulation  Published on DEA Website: July 01, 2022  Title: Revised Regulation Allows DEA-Registered Pharmacies to Electrical Engineer Prescriptions at a Patients Request DEA Headquarters Division - [employer]  Patients now have the ability to request their electronic prescription be transferred to another pharmacy without having to go back to their practitioner to initiate the request. This revised regulation went into effect on Monday, June 27, 2022.     At a patients request, a DEA-registered retail pharmacy can now transfer an electronic prescription for a controlled substance (schedules II-V) to another DEA-registered retail pharmacy. Prior to this change, patients would have to go through their  practitioner to cancel their prescription and have it re-issued to a different pharmacy. The process was taxing and time consuming for both patients and practitioners.    The Drug Enforcement Administration [facility]) published its intent to revise the process for transferring electronic prescriptions on September 18, 2020.  The final rule was published in the  federal register on May 26, 2022 and went into effect 30 days later.  Under the final rule, a prescription can only be transferred once between pharmacies, and only if allowed under existing state or other applicable law. The prescription must remain in its electronic form; may not be altered in any way; and the transfer must be communicated directly between two licensed pharmacists. Its important to note, any authorized refills transfer with the original prescription, which means the entire prescription will be filled at the same pharmacy.    REFERENCES: 1. DEA website announcement hugehand.is  2. Department of Justice website  [email or web address]  3. DEPARTMENT OF JUSTICE Drug Enforcement Administration 21 CFR Part 1306 [Docket No. [id number]] RIN [id number] Transfer of Electronic Prescriptions for Schedules II-V Controlled Substances Between Pharmacies for Initial Filling  ______________________________________________________________________       ______________________________________________________________________    Medication Transfer   Notification You are currently compliant and stable on your pain medication regimen. This regimen will be transferred today to your Primary Care Provider (PCP). You will be provided with enough prescriptions to last for 90 days. After that, your prescriptions will need to be taken over by your PCP.  Recommendation Immediately contact your primary care provider to secure an appointment for evaluation before this period is over. Do not wait until the last month to contact them.   Clarification The transfer of your medication regimen does not mean that you are being discharged from our clinic. We will remain available to you for any consultation or interventional therapies you may need.    Alternative Should you decide not to continue taking these medication and would like assistance in permanently stopping them, please let us  know so that we can design a slow tapering down of your regimen.  Reason Our primary responsibility to provide specialized interventional pain management therapies otherwise not available to the community. We have in the past assisted primary care providers with reviewing and adjusting pain medication management therapies, however, we have been transparent to all patients and referring providers that it is not our intention to permanently take over this type of therapy. Transfer of this portion of your care will assist us  in freeing time to assist others in need of our specialty services.   ______________________________________________________________________      ______________________________________________________________________    WARNING: CBD (cannabidiol ) & Delta (Delta-8 tetrahydrocannabinol) products.   Applicable to:  All individuals currently taking or considering taking CBD (cannabidiol ) and, more important, all patients taking opioid analgesic controlled substances (pain medication). (Example: oxycodone ; oxymorphone; hydrocodone ; hydromorphone; morphine; methadone; tramadol; tapentadol ; fentanyl ; buprenorphine; butorphanol; dextromethorphan; meperidine; codeine; etc.)  Introduction:  Recently there has been a drive towards the use of natural products for the treatment of different conditions, including pain anxiety and sleep disorders. Marijuana and hemp are two varieties of the cannabis genus plants. Marijuana and its derivatives are illegal, while hemp and its derivatives are not. Cannabidiol  (CBD) and tetrahydrocannabinol (THC), are two natural compounds found in plants of the Cannabis genus. They can both be extracted from hemp or marijuana. Both compounds interact with your bodys endocannabinoid system in very different ways. CBD is  associated with pain relief (analgesia) while  THC is associated with the psychoactive effects (the high) obtained from the use of marijuana products. There are two main types of THC: Delta-9, which comes from the marijuana plant and it is illegal, and Delta-8, which comes from the hemp plant, and it is legal. (Both, Delta-9-THC and Delta-8-THC are psychoactive and give you the high.)   Legality:  Marijuana and its derivatives: illegal Hemp and its derivatives: Legal (State dependent) UPDATE: (12/17/2021) The Drug Enforcement Agency (DEA) issued a letter stating that delta cannabinoids, including Delta-8-THCO and Delta-9-THCO, synthetically derived from hemp do not qualify as hemp and will be viewed as Schedule I drugs. (Schedule I drugs, substances, or chemicals are defined as drugs with no currently accepted medical use and a high potential for abuse. Some examples of Schedule I drugs are: heroin, lysergic acid diethylamide (LSD), marijuana (cannabis), 3,4-methylenedioxymethamphetamine (ecstasy), methaqualone, and peyote.) (cuetune.com.ee)  Legal status of CBD in :  Conditionally Legal  Reference: FDA Regulation of Cannabis and Cannabis-Derived Products, Including Cannabidiol  (CBD) - https://www.fda.gov/news-events/public-health-focus/fda-regulation-cannabis-and-cannabis-derived-products-including-cannabidiol -cbd  Warning:  CBD is not FDA approved and has not undergo the same manufacturing controls as prescription drugs.  This means that the purity and safety of available CBD may be questionable. Most of the time, despite manufacturer's claims, it is contaminated with THC (delta-9-tetrahydrocannabinol - the chemical in marijuana responsible for the HIGH).  When this is the case, the [facility] contaminant will trigger a positive urine drug screen (UDS) test for Marijuana (carboxy-THC).   The FDA recently put out a warning about 5 things that everyone should be aware of regarding Delta-8  THC: Delta-8 THC products have not been evaluated or approved by the FDA for safe use and may be marketed in ways that put the public health at risk. The FDA has received adverse event reports involving delta-8 THC-containing products. Delta-8 THC has psychoactive and intoxicating effects. Delta-8 THC manufacturing often involve use of potentially harmful chemicals to create the concentrations of delta-8 THC claimed in the marketplace. The final delta-8 THC product may have potentially harmful by-products (contaminants) due to the chemicals used in the process. Manufacturing of delta-8 THC products may occur in uncontrolled or unsanitary settings, which may lead to the presence of unsafe contaminants or other potentially harmful substances. Delta-8 THC products should be kept out of the reach of children and pets.  NOTE: Because a positive UDS for any illicit substance is a violation of our medication agreement, your opioid analgesics (pain medicine) may be permanently discontinued.  MORE ABOUT CBD  General Information: CBD was discovered in [birth year] and it is a derivative of the cannabis sativa genus plants (Marijuana and Hemp). It is one of the 113 identified substances found in Marijuana. It accounts for up to 40% of the plant's extract. As of 2018, preliminary clinical studies on CBD included research for the treatment of anxiety, movement disorders, and pain. CBD is available and consumed in multiple forms, including inhalation of smoke or vapor, as an aerosol spray, and by mouth. It may be supplied as an oil containing CBD, capsules, dried cannabis, or as a liquid solution. CBD is thought not to be as psychoactive as THC (delta-9-tetrahydrocannabinol - the chemical in marijuana responsible for the HIGH). Studies suggest that CBD may interact with different biological target receptors in the body, including cannabinoid and other neurotransmitter receptors. As of 2018 the mechanism of action for its  biological effects has not been determined.  Side-effects  Adverse reactions: Dry mouth, diarrhea, decreased appetite, fatigue, drowsiness, malaise, weakness, sleep disturbances,  and others.  Drug interactions:  CBD may interact with medications such as blood-thinners. CBD causes drowsiness on its own and it will increase drowsiness caused by other medications, including antihistamines (such as Benadryl ), benzodiazepines (Xanax, Ativan, Valium ), antipsychotics, antidepressants, opioids, alcohol and supplements such as kava, melatonin and St. John's Wort.  Other drug interactions: Brivaracetam (Briviact); Caffeine; Carbamazepine (Tegretol); Citalopram (Celexa); Clobazam (Onfi); Eslicarbazepine (Aptiom); Everolimus (Zostress); Lithium; Methadone (Dolophine); Rufinamide (Banzel); Sedative medications (CNS depressants); Sirolimus (Rapamune); Stiripentol (Diacomit); Tacrolimus (Prograf); Tamoxifen ; Soltamox); Topiramate (Topamax); Valproate; Warfarin (Coumadin); Zonisamide. (Last update: 10/10/2022) ______________________________________________________________________     ______________________________________________________________________    Muscle Spasms & Cramps  Cause(s):  Most common - vitamin and/or electrolyte (calcium, potassium, sodium, etc.) deficiencies. Post procedure - steroids (injected, oral, or inhaled) can make your kidneys excrete (loose) electrolytes. Most of the time this will not cause any symptoms however, if you happen to be borderline low on your electrolytes, it may temporarily triggering cramps & spasms.  Possible triggers: Sweating - causes loss of electrolytes thru the skin. Steroids - causes loss of electrolytes thru the urine.  Treatment: (over-the-counter)  Gatorade (or any other electrolyte-replenishing drink) - Take 1, 8 oz glass with each meal (3 times a day). Mechanism of action: Replenishes lost electrolytes. Magnesium  400 to 500 mg - Take 1 tablet twice a  day (one with breakfast and one at bedtime). If you have kidney disease talk to your primary care physician before taking any Magnesium . Mechanism of action: Magnesium  is a natural muscle relaxant. Tonic Water with quinine - Take 1, 8 oz glass before bedtime.  Mechanism of action: Quinine is used to treat spasms.  Last Update: 05/11/2023  ______________________________________________________________________     ______________________________________________________________________    Appointment Information  It is our goal and responsibility to provide the medical community with assistance in the evaluation and management of patients with chronic pain. Unfortunately our resources are limited. Because we do not have an unlimited amount of time, or available appointments, we are required to closely monitor for unkept or cancelled appointments.  Patient's responsibilities: 1. Punctuality: Patients are required to be physically present in our office at least 15 minutes before their scheduled appointment. 2. Tardiness: Patients not physically present in our office at their scheduled appointment time will be rescheduled. 3. Plan ahead: Assume that you will encounter traffic and plan to arrive 30 minutes before your appointment. 4. Other appointments and responsibilities: Do not schedule other appointments immediately before or after your scheduled appointment.  5. Be prepared: Make a list of everything that you need to discuss with your provider so that you use your time efficiently. Once the provider leaves your room, he/she will not return to your room to discuss anything that you neglected to bring up during your allowed time. 6. No children or pets: Do not bring children or pets to your appointment. 7. Cancelling or rescheduling your appointment: Advanced notification (more than 24 hours in advance) is required. 8. No Show: Not calling to cancel an appointment and simply not showing up is  unacceptable. This leads to loss of appointments that could have been used by a patient in need. (See below)  Corrective process for repeat offenders:  No Shows: Three (3) No Shows within a 12 month period will result in an automatic discharge from our practice. Rescheduling or cancelling with more than 24 hours notice will not be penalized and will not count against you. Tardiness: If you have to be rescheduled three (3) times due to late  arrivals, it will be counted as one (1) No Show. Cancellation or reschedule: Three (3) cancellations or rescheduling where notice was given with less than 24 hours in advance, will be recorded as one (1) No Show.  Types of appointments: New patient initial evaluation: These are evaluations only. Your initial patient questionnaire will be collected and entered into the system. A history of present illness will be taken. Prior lab work, imaging studies, and associated treatments will be reviewed. The provider may order appropriate diagnostic testing depending on their evaluation and review of available information. No treatments will be started on this visit. 2nd Follow-up visit: During this visit your provider will inform you of the results of the diagnostic tests ordered on the initial evaluation. Based on the providers assessment, treatment options will be offered, at which the patient will decide if he/she is interested in the alternatives. If interested, a treatment plan will be established and started. Procedure visits: Post-procedure evaluation visits: Evaluation visits MM New problems Flare-up evaluations Follow-up after diagnostic testing ______________________________________________________________________     ______________________________________________________________________    Procedure instructions  Stop blood-thinners  Do not eat or drink fluids (other than water) for 8 hours before your procedure  No water for 2 hours before  your procedure  Take your blood pressure medicine with a sip of water  Arrive 30 minutes before your appointment  If sedation is planned, bring suitable driver. [place], [place], & public transportation are NOT APPROVED)  Carefully read the Preparing for your procedure detailed instructions  If you have questions call us  at [phone]  Procedure appointments are for procedures only.   NO medication refills or new problem evaluations will be done on procedure days.   Only the scheduled, pre-approved procedure and side will be done.   ______________________________________________________________________     ______________________________________________________________________    Preparing for your procedure  Appointments: If you think you may not be able to keep your appointment, call 24-48 hours in advance to cancel. We need time to make it available to others.  Procedure visits are for procedures only. During your procedure appointment there will be: NO Prescription Refills*. NO medication changes or discussions*. NO discussion of disability issues*. NO unrelated pain problem evaluations*. NO evaluations to order other pain procedures*. *These will be addressed at a separate and distinct evaluation encounter on the provider's evaluation schedule and not during procedure days.  Instructions: Food intake: Avoid eating anything solid for at least 8 hours prior to your procedure. Clear liquid intake: You may take clear liquids such as water up to 2 hours prior to your procedure. (No carbonated drinks. No soda.) Transportation: Unless otherwise stated by your physician, bring a driver. (Driver cannot be a Market Researcher, Pharmacist, Community, or any other form of public transportation.) Morning Medicines: Except for blood thinners, take all of your other morning medications with a sip of water. Make sure to take your heart and blood pressure medicines. If your blood pressure's lower number is above 100,  the case will be rescheduled. Blood thinners: Make sure to stop your blood thinners as instructed.  If you take a blood thinner, but were not instructed to stop it, call our office [phone] and ask to talk to a nurse. Not stopping a blood thinner prior to certain procedures could lead to serious complications. Diabetics on insulin: Notify the staff so that you can be scheduled 1st case in the morning. If your diabetes requires high dose insulin, take only  of your normal insulin dose the  morning of the procedure and notify the staff that you have done so. Preventing infections: Shower with an antibacterial soap the morning of your procedure.  Build-up your immune system: Take 1000 mg of Vitamin C with every meal (3 times a day) the day prior to your procedure. Antibiotics: Inform the nursing staff if you are taking any antibiotics or if you have any conditions that may require antibiotics prior to procedures. (Example: recent joint implants)   Pregnancy: If you are pregnant make sure to notify the nursing staff. Not doing so may result in injury to the fetus, including death.  Sickness: If you have a cold, fever, or any active infections, call and cancel or reschedule your procedure. Receiving steroids while having an infection may result in complications. Arrival: You must be in the facility at least 30 minutes prior to your scheduled procedure. Tardiness: Your scheduled time is also the cutoff time. If you do not arrive at least 15 minutes prior to your procedure, you will be rescheduled.  Children: Do not bring any children with you. Make arrangements to keep them home. Dress appropriately: There is always a possibility that your clothing may get soiled. Avoid long dresses. Valuables: Do not bring any jewelry or valuables.  Reasons to call and reschedule or cancel your procedure: (Following these recommendations will minimize the risk of a serious complication.) Surgeries: Avoid having  procedures within 2 weeks of any surgery. (Avoid for 2 weeks before or after any surgery). Flu Shots: Avoid having procedures within 2 weeks of a flu shots or . (Avoid for 2 weeks before or after immunizations). Barium: Avoid having a procedure within 7-10 days after having had a radiological study involving the use of radiological contrast. (Myelograms, Barium swallow or enema study). Heart attacks: Avoid any elective procedures or surgeries for the initial 6 months after a Myocardial Infarction (Heart Attack). Blood thinners: It is imperative that you stop these medications before procedures. Let us  know if you if you take any blood thinner.  Infection: Avoid procedures during or within two weeks of an infection (including chest colds or gastrointestinal problems). Symptoms associated with infections include: Localized redness, fever, chills, night sweats or profuse sweating, burning sensation when voiding, cough, congestion, stuffiness, runny nose, sore throat, diarrhea, nausea, vomiting, cold or Flu symptoms, recent or current infections. It is specially important if the infection is over the area that we intend to treat. Heart and lung problems: Symptoms that may suggest an active cardiopulmonary problem include: cough, chest pain, breathing difficulties or shortness of breath, dizziness, ankle swelling, uncontrolled high or unusually low blood pressure, and/or palpitations. If you are experiencing any of these symptoms, cancel your procedure and contact your primary care physician for an evaluation.  Remember:  Regular Business hours are:  Monday to Thursday 8:00 AM to 4:00 PM  Provider's Schedule: Eric Como, MD:  Procedure days: Tuesday and Thursday 7:30 AM to 4:00 PM  Wallie Sherry, MD:  Procedure days: Monday and Wednesday 7:30 AM to 4:00 PM Last  Updated: 10/10/2023 ______________________________________________________________________      ______________________________________________________________________    Blood Thinners  IMPORTANT NOTICE:  If you take any of these, make sure to notify the nursing staff.  Failure to do so may result in serious injury.  Recommended time intervals to stop and restart blood-thinners, before & after invasive procedures  Generic Name Brand Name Pre-procedure: Stop medication for this amount of time before your procedure: Post-procedure: Wait this amount of time after the procedure before restarting  your medication:  Abciximab Reopro 15 days 2 hrs  Alteplase Activase 10 days 10 days  Anagrelide Agrylin    Apixaban Eliquis 3 days 6 hrs  Cilostazol Pletal 3 days 5 hrs  Clopidogrel Plavix 7-10 days 2 hrs  Dabigatran Pradaxa 5 days 6 hrs  Dalteparin Fragmin 24 hours 4 hrs  Dipyridamole Aggrenox 11days 2 hrs  Edoxaban Lixiana; Savaysa 3 days 2 hrs  Enoxaparin  Lovenox 24 hours 4 hrs  Eptifibatide Integrillin 8 hours 2 hrs  Fondaparinux  Arixtra 72 hours 12 hrs  Hydroxychloroquine Plaquenil 11 days   Prasugrel Effient 7-10 days 6 hrs  Reteplase Retavase 10 days 10 days  Rivaroxaban Xarelto 3 days 6 hrs  Ticagrelor Brilinta 5-7 days 6 hrs  Ticlopidine Ticlid 10-14 days 2 hrs  Tinzaparin Innohep 24 hours 4 hrs  Tirofiban Aggrastat 8 hours 2 hrs  Warfarin Coumadin 5 days 2 hrs   Other medications with blood-thinning effects  NOTE: Consider stopping these if you have prolonged bleeding despite not taking any of the above blood thinners. Otherwise ask your provider and this will be decided on a case-by-case basis.  Product indications Generic (Brand) names Note  Cholesterol Lipitor Stop 4 days before procedure  Blood thinner (injectable) Heparin (LMW or LMWH Heparin) Stop 24 hours before procedure  Cancer Ibrutinib (Imbruvica) Stop 7 days before procedure  Malaria/Rheumatoid Hydroxychloroquine (Plaquenil) Stop 11 days before procedure  Thrombolytics  10 days before or after procedures    Over-the-counter (OTC) Products with blood-thinning effects  NOTE: Consider stopping these if you have prolonged bleeding despite not taking any of the above blood thinners. Otherwise ask your provider and this will be decided on a case-by-case basis.  Product Common names Stop Time  Aspirin > 325 mg Goody Powders, Excedrin, etc. 11 days  Aspirin <= 81 mg  7 days  Fish oil  4 days  Garlic supplements  7 days  Ginkgo biloba  36 hours  Ginseng  24 hours  NSAIDs Ibuprofen, Naprosyn , etc. 3 days  Vitamin E  4 days   ______________________________________________________________________     "

## 2024-12-04 NOTE — Progress Notes (Signed)
 Nursing Pain Medication Assessment:  Safety precautions to be maintained throughout the outpatient stay will include: orient to surroundings, keep bed in low position, maintain call bell within reach at all times, provide assistance with transfer out of bed and ambulation.  Medication Inspection Compliance: Ronald Horne did not comply with our request to bring his pills to be counted. He was reminded that bringing the medication bottles, even when empty, is a requirement.  Medication: None brought in. Pill/Patch Count: None available to be counted. Bottle Appearance: No container available. Did not bring bottle(s) to appointment. Filled Date: N/A Last Medication intake:  Today

## 2024-12-16 ENCOUNTER — Ambulatory Visit: Admitting: Student in an Organized Health Care Education/Training Program

## 2024-12-26 ENCOUNTER — Ambulatory Visit: Admitting: Cardiovascular Disease

## 2024-12-30 ENCOUNTER — Encounter: Admitting: Nurse Practitioner
